# Patient Record
Sex: Male | Born: 1935 | Race: White | Hispanic: No | Marital: Married | Smoking: Former smoker
Health system: Southern US, Community
[De-identification: ages and names within clinical notes are randomized; demographics above are authoritative.]

## PROBLEM LIST (undated history)

## (undated) DIAGNOSIS — G8929 Other chronic pain: Secondary | ICD-10-CM

## (undated) DIAGNOSIS — F329 Major depressive disorder, single episode, unspecified: Secondary | ICD-10-CM

## (undated) DIAGNOSIS — N184 Chronic kidney disease, stage 4 (severe): Secondary | ICD-10-CM

## (undated) DIAGNOSIS — E785 Hyperlipidemia, unspecified: Secondary | ICD-10-CM

## (undated) DIAGNOSIS — N200 Calculus of kidney: Secondary | ICD-10-CM

## (undated) DIAGNOSIS — I10 Essential (primary) hypertension: Secondary | ICD-10-CM

## (undated) DIAGNOSIS — E1143 Type 2 diabetes mellitus with diabetic autonomic (poly)neuropathy: Secondary | ICD-10-CM

## (undated) DIAGNOSIS — Z9289 Personal history of other medical treatment: Secondary | ICD-10-CM

## (undated) DIAGNOSIS — M25579 Pain in unspecified ankle and joints of unspecified foot: Secondary | ICD-10-CM

## (undated) DIAGNOSIS — K3184 Gastroparesis: Secondary | ICD-10-CM

## (undated) DIAGNOSIS — F32A Depression, unspecified: Secondary | ICD-10-CM

## (undated) DIAGNOSIS — E875 Hyperkalemia: Secondary | ICD-10-CM

## (undated) DIAGNOSIS — G709 Myoneural disorder, unspecified: Secondary | ICD-10-CM

## (undated) DIAGNOSIS — J0510 Acute epiglottitis without obstruction: Secondary | ICD-10-CM

## (undated) DIAGNOSIS — K219 Gastro-esophageal reflux disease without esophagitis: Secondary | ICD-10-CM

## (undated) DIAGNOSIS — H905 Unspecified sensorineural hearing loss: Secondary | ICD-10-CM

## (undated) DIAGNOSIS — M1990 Unspecified osteoarthritis, unspecified site: Secondary | ICD-10-CM

## (undated) DIAGNOSIS — E119 Type 2 diabetes mellitus without complications: Secondary | ICD-10-CM

## (undated) DIAGNOSIS — J189 Pneumonia, unspecified organism: Secondary | ICD-10-CM

## (undated) DIAGNOSIS — K922 Gastrointestinal hemorrhage, unspecified: Secondary | ICD-10-CM

## (undated) DIAGNOSIS — R410 Disorientation, unspecified: Secondary | ICD-10-CM

## (undated) DIAGNOSIS — Z8739 Personal history of other diseases of the musculoskeletal system and connective tissue: Secondary | ICD-10-CM

## (undated) DIAGNOSIS — D649 Anemia, unspecified: Secondary | ICD-10-CM

## (undated) DIAGNOSIS — N390 Urinary tract infection, site not specified: Secondary | ICD-10-CM

## (undated) DIAGNOSIS — C61 Malignant neoplasm of prostate: Secondary | ICD-10-CM

## (undated) HISTORY — PX: FRACTURE SURGERY: SHX138

## (undated) HISTORY — PX: APPENDECTOMY: SHX54

## (undated) HISTORY — DX: Calculus of kidney: N20.0

## (undated) HISTORY — DX: Urinary tract infection, site not specified: N39.0

## (undated) HISTORY — DX: Hyperkalemia: E87.5

## (undated) HISTORY — PX: CATARACT EXTRACTION W/ INTRAOCULAR LENS  IMPLANT, BILATERAL: SHX1307

## (undated) HISTORY — DX: Unspecified sensorineural hearing loss: H90.5

## (undated) HISTORY — PX: INGUINAL HERNIA REPAIR: SUR1180

## (undated) HISTORY — DX: Hyperlipidemia, unspecified: E78.5

## (undated) HISTORY — DX: Acute epiglottitis without obstruction: J05.10

---

## 2005-07-06 ENCOUNTER — Ambulatory Visit: Payer: Self-pay | Admitting: Family Medicine

## 2006-06-22 ENCOUNTER — Ambulatory Visit: Payer: Self-pay | Admitting: Cardiology

## 2006-07-20 ENCOUNTER — Encounter (INDEPENDENT_AMBULATORY_CARE_PROVIDER_SITE_OTHER): Payer: Self-pay | Admitting: *Deleted

## 2006-07-21 ENCOUNTER — Inpatient Hospital Stay (HOSPITAL_COMMUNITY): Admission: RE | Admit: 2006-07-21 | Discharge: 2006-07-22 | Payer: Self-pay | Admitting: Urology

## 2007-10-18 ENCOUNTER — Ambulatory Visit (HOSPITAL_COMMUNITY): Admission: RE | Admit: 2007-10-18 | Discharge: 2007-10-18 | Payer: Self-pay | Admitting: Urology

## 2007-11-14 ENCOUNTER — Ambulatory Visit: Admission: RE | Admit: 2007-11-14 | Discharge: 2007-12-27 | Payer: Self-pay | Admitting: Radiation Oncology

## 2007-12-28 ENCOUNTER — Ambulatory Visit: Admission: RE | Admit: 2007-12-28 | Discharge: 2007-12-28 | Payer: Self-pay | Admitting: Radiation Oncology

## 2007-12-29 ENCOUNTER — Encounter: Payer: Self-pay | Admitting: Family Medicine

## 2007-12-29 DIAGNOSIS — C61 Malignant neoplasm of prostate: Secondary | ICD-10-CM

## 2007-12-29 HISTORY — DX: Malignant neoplasm of prostate: C61

## 2008-01-03 ENCOUNTER — Ambulatory Visit: Admission: RE | Admit: 2008-01-03 | Discharge: 2008-03-22 | Payer: Self-pay | Admitting: Radiation Oncology

## 2009-09-24 ENCOUNTER — Encounter (HOSPITAL_COMMUNITY): Admission: RE | Admit: 2009-09-24 | Discharge: 2009-09-24 | Payer: Self-pay | Admitting: Urology

## 2010-12-04 ENCOUNTER — Inpatient Hospital Stay (HOSPITAL_COMMUNITY): Admission: EM | Admit: 2010-12-04 | Discharge: 2010-10-04 | Payer: Self-pay | Admitting: Emergency Medicine

## 2011-01-29 NOTE — Letter (Signed)
Summary: Historic Patient File  Historic Patient File   Imported By: Lind Guest 09/29/2010 10:44:38  _____________________________________________________________________  External Attachment:    Type:   Image     Comment:   External Document

## 2011-03-12 LAB — CLOSTRIDIUM DIFFICILE EIA: C difficile Toxins A+B, EIA: NEGATIVE

## 2011-03-12 LAB — CBC
HCT: 27.2 % — ABNORMAL LOW (ref 39.0–52.0)
HCT: 27.7 % — ABNORMAL LOW (ref 39.0–52.0)
HCT: 29.1 % — ABNORMAL LOW (ref 39.0–52.0)
HCT: 32.5 % — ABNORMAL LOW (ref 39.0–52.0)
Hemoglobin: 10.1 g/dL — ABNORMAL LOW (ref 13.0–17.0)
Hemoglobin: 11.3 g/dL — ABNORMAL LOW (ref 13.0–17.0)
Hemoglobin: 9.8 g/dL — ABNORMAL LOW (ref 13.0–17.0)
Hemoglobin: 9.9 g/dL — ABNORMAL LOW (ref 13.0–17.0)
MCH: 31.6 pg (ref 26.0–34.0)
MCH: 31.8 pg (ref 26.0–34.0)
MCH: 33.3 pg (ref 26.0–34.0)
MCHC: 34.7 g/dL (ref 30.0–36.0)
MCHC: 34.8 g/dL (ref 30.0–36.0)
MCHC: 34.9 g/dL (ref 30.0–36.0)
MCV: 90.6 fL (ref 78.0–100.0)
MCV: 90.9 fL (ref 78.0–100.0)
MCV: 91.4 fL (ref 78.0–100.0)
MCV: 92.2 fL (ref 78.0–100.0)
Platelets: 133 10*3/uL — ABNORMAL LOW (ref 150–400)
Platelets: 142 10*3/uL — ABNORMAL LOW (ref 150–400)
RBC: 2.95 MIL/uL — ABNORMAL LOW (ref 4.22–5.81)
RBC: 3.56 MIL/uL — ABNORMAL LOW (ref 4.22–5.81)
RDW: 12.6 % (ref 11.5–15.5)
RDW: 12.6 % (ref 11.5–15.5)
RDW: 12.8 % (ref 11.5–15.5)
RDW: 12.9 % (ref 11.5–15.5)
WBC: 4.9 10*3/uL (ref 4.0–10.5)
WBC: 5.9 10*3/uL (ref 4.0–10.5)
WBC: 7.8 10*3/uL (ref 4.0–10.5)

## 2011-03-12 LAB — DIFFERENTIAL
Basophils Absolute: 0 10*3/uL (ref 0.0–0.1)
Basophils Absolute: 0 10*3/uL (ref 0.0–0.1)
Basophils Relative: 0 % (ref 0–1)
Basophils Relative: 0 % (ref 0–1)
Eosinophils Absolute: 0.1 10*3/uL (ref 0.0–0.7)
Eosinophils Absolute: 0.1 10*3/uL (ref 0.0–0.7)
Eosinophils Absolute: 0.2 10*3/uL (ref 0.0–0.7)
Eosinophils Absolute: 0.2 10*3/uL (ref 0.0–0.7)
Eosinophils Absolute: 0.2 10*3/uL (ref 0.0–0.7)
Eosinophils Relative: 1 % (ref 0–5)
Eosinophils Relative: 1 % (ref 0–5)
Eosinophils Relative: 3 % (ref 0–5)
Eosinophils Relative: 3 % (ref 0–5)
Lymphocytes Relative: 12 % (ref 12–46)
Lymphs Abs: 1 10*3/uL (ref 0.7–4.0)
Lymphs Abs: 1.2 10*3/uL (ref 0.7–4.0)
Lymphs Abs: 1.3 10*3/uL (ref 0.7–4.0)
Lymphs Abs: 2.1 10*3/uL (ref 0.7–4.0)
Monocytes Absolute: 0.3 10*3/uL (ref 0.1–1.0)
Monocytes Absolute: 0.6 10*3/uL (ref 0.1–1.0)
Monocytes Absolute: 0.9 10*3/uL (ref 0.1–1.0)
Monocytes Relative: 14 % — ABNORMAL HIGH (ref 3–12)
Monocytes Relative: 15 % — ABNORMAL HIGH (ref 3–12)
Monocytes Relative: 8 % (ref 3–12)
Neutro Abs: 6.1 10*3/uL (ref 1.7–7.7)
Neutrophils Relative %: 50 % (ref 43–77)
Neutrophils Relative %: 51 % (ref 43–77)
Neutrophils Relative %: 63 % (ref 43–77)
Neutrophils Relative %: 78 % — ABNORMAL HIGH (ref 43–77)

## 2011-03-12 LAB — BASIC METABOLIC PANEL
BUN: 23 mg/dL (ref 6–23)
BUN: 38 mg/dL — ABNORMAL HIGH (ref 6–23)
CO2: 22 mEq/L (ref 19–32)
CO2: 23 mEq/L (ref 19–32)
Calcium: 9.5 mg/dL (ref 8.4–10.5)
Chloride: 104 mEq/L (ref 96–112)
Chloride: 108 mEq/L (ref 96–112)
Chloride: 110 mEq/L (ref 96–112)
Creatinine, Ser: 1.54 mg/dL — ABNORMAL HIGH (ref 0.4–1.5)
Creatinine, Ser: 1.83 mg/dL — ABNORMAL HIGH (ref 0.4–1.5)
GFR calc Af Amer: 44 mL/min — ABNORMAL LOW (ref >60)
GFR calc Af Amer: 53 mL/min — ABNORMAL LOW (ref >60)
GFR calc non Af Amer: 36 mL/min — ABNORMAL LOW (ref >60)
Glucose, Bld: 269 mg/dL — ABNORMAL HIGH (ref 70–99)
Glucose, Bld: 72 mg/dL (ref 70–99)
Potassium: 4.1 mEq/L (ref 3.5–5.1)
Potassium: 6.5 mEq/L (ref 3.5–5.1)
Sodium: 133 mEq/L — ABNORMAL LOW (ref 135–145)
Sodium: 139 mEq/L (ref 135–145)

## 2011-03-12 LAB — COMPREHENSIVE METABOLIC PANEL
ALT: 19 U/L (ref 0–53)
ALT: 25 U/L (ref 0–53)
ALT: 25 U/L (ref 0–53)
AST: 24 U/L (ref 0–37)
AST: 26 U/L (ref 0–37)
AST: 34 U/L (ref 0–37)
Albumin: 3.4 g/dL — ABNORMAL LOW (ref 3.5–5.2)
Alkaline Phosphatase: 64 U/L (ref 39–117)
Alkaline Phosphatase: 71 U/L (ref 39–117)
BUN: 38 mg/dL — ABNORMAL HIGH (ref 6–23)
CO2: 21 mEq/L (ref 19–32)
CO2: 25 mEq/L (ref 19–32)
Calcium: 8.5 mg/dL (ref 8.4–10.5)
Calcium: 9.2 mg/dL (ref 8.4–10.5)
Chloride: 111 mEq/L (ref 96–112)
Creatinine, Ser: 1.97 mg/dL — ABNORMAL HIGH (ref 0.4–1.5)
GFR calc Af Amer: 40 mL/min — ABNORMAL LOW (ref >60)
GFR calc Af Amer: 56 mL/min — ABNORMAL LOW (ref >60)
GFR calc Af Amer: >60 mL/min (ref >60)
GFR calc non Af Amer: 33 mL/min — ABNORMAL LOW (ref >60)
GFR calc non Af Amer: 51 mL/min — ABNORMAL LOW (ref >60)
Glucose, Bld: 119 mg/dL — ABNORMAL HIGH (ref 70–99)
Glucose, Bld: 126 mg/dL — ABNORMAL HIGH (ref 70–99)
Glucose, Bld: 196 mg/dL — ABNORMAL HIGH (ref 70–99)
Potassium: 3.8 mEq/L (ref 3.5–5.1)
Potassium: 4.6 mEq/L (ref 3.5–5.1)
Sodium: 140 mEq/L (ref 135–145)
Sodium: 140 mEq/L (ref 135–145)
Sodium: 141 mEq/L (ref 135–145)
Total Bilirubin: 0.5 mg/dL (ref 0.3–1.2)
Total Protein: 5.8 g/dL — ABNORMAL LOW (ref 6.0–8.3)
Total Protein: 6 g/dL (ref 6.0–8.3)
Total Protein: 6.9 g/dL (ref 6.0–8.3)

## 2011-03-12 LAB — MAGNESIUM
Magnesium: 1.6 mg/dL (ref 1.5–2.5)
Magnesium: 1.8 mg/dL (ref 1.5–2.5)
Magnesium: 1.8 mg/dL (ref 1.5–2.5)
Magnesium: 1.9 mg/dL (ref 1.5–2.5)

## 2011-03-12 LAB — OSMOLALITY, URINE: Osmolality, Ur: 488 mOsm/kg (ref 390–1090)

## 2011-03-12 LAB — URINALYSIS, ROUTINE W REFLEX MICROSCOPIC
Bilirubin Urine: NEGATIVE
Ketones, ur: NEGATIVE mg/dL
Nitrite: NEGATIVE
Protein, ur: 100 mg/dL — AB
Specific Gravity, Urine: 1.019 (ref 1.005–1.030)
Urobilinogen, UA: 0.2 mg/dL (ref 0.0–1.0)

## 2011-03-12 LAB — GLUCOSE, CAPILLARY
Glucose-Capillary: 117 mg/dL — ABNORMAL HIGH (ref 70–99)
Glucose-Capillary: 122 mg/dL — ABNORMAL HIGH (ref 70–99)
Glucose-Capillary: 143 mg/dL — ABNORMAL HIGH (ref 70–99)
Glucose-Capillary: 149 mg/dL — ABNORMAL HIGH (ref 70–99)
Glucose-Capillary: 166 mg/dL — ABNORMAL HIGH (ref 70–99)
Glucose-Capillary: 182 mg/dL — ABNORMAL HIGH (ref 70–99)
Glucose-Capillary: 192 mg/dL — ABNORMAL HIGH (ref 70–99)
Glucose-Capillary: 265 mg/dL — ABNORMAL HIGH (ref 70–99)
Glucose-Capillary: 92 mg/dL (ref 70–99)
Glucose-Capillary: 93 mg/dL (ref 70–99)
Glucose-Capillary: 98 mg/dL (ref 70–99)

## 2011-03-12 LAB — HEPATIC FUNCTION PANEL
Bilirubin, Direct: 0.1 mg/dL (ref 0.0–0.3)
Indirect Bilirubin: 0.6 mg/dL (ref 0.3–0.9)
Total Protein: 7.3 g/dL (ref 6.0–8.3)

## 2011-03-12 LAB — HEMOGLOBIN A1C
Hgb A1c MFr Bld: 10.1 % — ABNORMAL HIGH (ref <5.7)
Mean Plasma Glucose: 243 mg/dL — ABNORMAL HIGH (ref <117)

## 2011-03-12 LAB — STOOL CULTURE

## 2011-03-12 LAB — SODIUM, URINE, RANDOM: Sodium, Ur: 86 mEq/L

## 2011-03-12 LAB — IRON AND TIBC
Iron: 12 ug/dL — ABNORMAL LOW (ref 42–135)
TIBC: 248 ug/dL (ref 215–435)

## 2011-03-12 LAB — URINE MICROSCOPIC-ADD ON

## 2011-03-12 LAB — OVA AND PARASITE EXAMINATION

## 2011-03-12 LAB — LIPASE, BLOOD: Lipase: 54 U/L (ref 11–59)

## 2011-03-12 LAB — URINE CULTURE: Special Requests: NEGATIVE

## 2011-03-12 LAB — CREATININE, URINE, RANDOM: Creatinine, Urine: 108.3 mg/dL

## 2011-03-12 LAB — CULTURE, BLOOD (ROUTINE X 2)
Culture  Setup Time: 201110041025
Culture: NO GROWTH
Culture: NO GROWTH

## 2011-03-12 LAB — FERRITIN: Ferritin: 542 ng/mL — ABNORMAL HIGH (ref 22–322)

## 2011-03-12 LAB — RETICULOCYTES: Retic Count, Absolute: 46.3 10*3/uL (ref 19.0–186.0)

## 2011-03-12 LAB — VITAMIN B12: Vitamin B-12: 240 pg/mL (ref 211–911)

## 2011-03-12 LAB — TSH: TSH: 2.725 u[IU]/mL (ref 0.350–4.500)

## 2011-05-15 NOTE — Discharge Summary (Signed)
Douglas [name], Douglas [name]              ACCOUNT NO.:  [id number]   MEDICAL RECORD NO.:  [id number]          PATIENT TYPE:  INP   LOCATION:  1412                         FACILITY:  [facility]   PHYSICIAN:  Excell Seltzer. Annabell Howells, M.D.    DATE OF BIRTH:  1931-09-28   DATE OF ADMISSION:  07/20/2006  DATE OF DISCHARGE:  07/22/2006                                 DISCHARGE SUMMARY   BRIEF HISTORY AND PHYSICAL:  The patient is a 77 year old white male with a  history of bladder stone and BPH with bladder outlet obstruction who is to  undergo TURP and cystourethropexy.  For details of the history and physical,  please see the office H and P but preoperative medications included  metformin, Lisinopril, Glyburide for hypertension and diabetes.  He also had  a history of kidney stones and was recently treated with levaquin for a  febrile urinary infection.   PAST SURGICAL HISTORY:  Significant for right inguinal hernia repair,  bladder calculi removal in 1986, ESWL remotely and an appendectomy.   HOSPITAL COURSE:  On 07/20/2006 the patient underwent TURP with removal of  bladder stone.  Postoperatively he did well.  His IV was discontinued along  with his continuous bladder irrigation.  On 07/22/2006 he was felt to be  ready for discharge home.   FINAL DIAGNOSES:  Benign prostatic hypertrophy with bladder outlet  obstruction and bladder stone.   DISCHARGE MEDICATIONS:  Include Cipro.   FOLLOWUP:  He was instructed to follow up with me in two to three weeks.   DISCHARGE INSTRUCTIONS:  His restrictions were explained.   CONDITION ON DISCHARGE:  Improved.   PROGNOSIS:  Good.   FINAL PATHOLOGY:  Revealed benign prostatic tissue with chronic  inflammation.      Excell Seltzer. Annabell Howells, M.D.  Electronically Signed     JJW/MEDQ  D:  08/11/2006  T:  08/11/2006  Job:  [id number]

## 2011-05-15 NOTE — Op Note (Signed)
NAMEESTEPHAN, GALLARDO              ACCOUNT NO.:  [id number]   MEDICAL RECORD NO.:  [id number]          PATIENT TYPE:  AMB   LOCATION:  DAY                          FACILITY:  [facility]   PHYSICIAN:  Excell Seltzer. Annabell Howells, M.D.    DATE OF BIRTH:  May 27, 1931   DATE OF PROCEDURE:  07/20/2006  DATE OF DISCHARGE:                                 OPERATIVE REPORT   PROCEDURE:  Transurethral resection prostate and removal bladder stone.   PREOPERATIVE DIAGNOSIS:  Benign prostatic hypertrophy and bladder  obstruction and bladder stone.   POSTOPERATIVE DIAGNOSIS:  Benign prostatic hypertrophy and bladder  obstruction and bladder stone.   SURGEON:  Bjorn Pippin, M.D.   ANESTHESIA:  General.   SPECIMEN:  Stone, which was given to the patient, and prostate chips, which  were sent to Pathology.   BLOOD LOSS:  500 mL.   COMPLICATIONS:  None.   DRAINS:  A 22-French three-way Foley catheter.   INDICATIONS:  Mr. Cutshaw is a 77 year old white male with a bladder stone  and bladder outlet obstruction.  He has elected TURP and removal of the  stone.   FINDINGS AND PROCEDURE:  The patient is given Cipro and Rocephin  preoperatively and he was taken to the operating room, where a general  anesthetic was induced.  He was placed in the lithotomy position.  His  perineum and genitalia were prepped with Betadine solution.  He was draped  in usual sterile fashion.   Cysto was performed with a 22-French scope and 12-degree lens.  Examination  revealed a normal urethra.  The external sphincter was intact.  The  prostatic urethra was elongated with trilobar hyperplasia with obstruction.  Examination of the bladder revealed moderate to severe trabeculation.  There  was some erythema at the base of the bladder from the stone rubbing against  the mucosa.  I could not see the stone because of the middle lobe.   The urethra was then calibrated to 32-French with Sissy Hoff sounds and a 28-  French continuous-flow  resectoscope sheath with obturator was passed without  difficulty.  This was fitted with an [place] handle, 12-degree lens and 26  loop.   With the resectoscope in place, I still was unable to visualize the stone,  so I initiated resection of the prostate.  The middle lobe was resected down  to the bladder neck fibers.  The stone was then visualized and was removed  via the resectoscope; it measured approximately 1.5 mm in greatest  dimension.  Once the stone was removed, the prostatic resection was  continued.  The floor of the prostate was resected out to alongside the  verumontanum.  The right lobe of the prostate was then resected from bladder  neck to apex out to the capsular fibers.  The left lobe was resected from  bladder neck to apex out to the capsular fibers.  The bladder was evacuated  free of chips, then residual anterior and apical tissue was resected.  Those  chips were removed.  Hemostasis was achieved.  The ureteral orifices were  inspected and were found to  be intact.  No active bleeding or retained chips  or clots were noted.  The bladder was left full.  The scope was removed.  Pressure on the bladder produced an excellent stream.  A 22-French three-way  Foley catheter was then placed with the aid of a catheter guide and the  balloon was filled to 30 mL of sterile fluid.  The catheter was placed on  traction and hand-irrigated until clear; it was then maintained on traction  with continuous normal saline irrigation.  The patient was taken down from  lithotomy position and his anesthetic was reversed.  He was moved to the  recovery room in stable condition.  There were no complications.      Excell Seltzer. Annabell Howells, M.D.  Electronically Signed     JJW/MEDQ  D:  07/20/2006  T:  07/20/2006  Job:  [id number]   cc:   Nena Jordan  [address]  [place]  [place] [address]

## 2011-07-17 ENCOUNTER — Ambulatory Visit (INDEPENDENT_AMBULATORY_CARE_PROVIDER_SITE_OTHER): Payer: No Typology Code available for payment source | Admitting: Urology

## 2011-07-17 DIAGNOSIS — N3020 Other chronic cystitis without hematuria: Secondary | ICD-10-CM

## 2011-07-17 DIAGNOSIS — C61 Malignant neoplasm of prostate: Secondary | ICD-10-CM

## 2011-07-17 DIAGNOSIS — N200 Calculus of kidney: Secondary | ICD-10-CM

## 2011-07-20 ENCOUNTER — Other Ambulatory Visit: Payer: Self-pay | Admitting: Urology

## 2011-07-20 DIAGNOSIS — C61 Malignant neoplasm of prostate: Secondary | ICD-10-CM

## 2011-08-27 ENCOUNTER — Ambulatory Visit (HOSPITAL_COMMUNITY)
Admission: RE | Admit: 2011-08-27 | Discharge: 2011-08-27 | Disposition: A | Discharge status: Home / Self Care | Payer: Medicare Other | Source: Ambulatory Visit | Attending: Urology | Admitting: Urology

## 2011-08-27 ENCOUNTER — Encounter (HOSPITAL_COMMUNITY): Payer: Self-pay

## 2011-08-27 ENCOUNTER — Encounter (HOSPITAL_COMMUNITY)
Admission: RE | Admit: 2011-08-27 | Discharge: 2011-08-27 | Disposition: A | Discharge status: Home / Self Care | Payer: Medicare Other | Source: Ambulatory Visit | Attending: Urology | Admitting: Urology

## 2011-08-27 DIAGNOSIS — C61 Malignant neoplasm of prostate: Secondary | ICD-10-CM

## 2011-08-27 DIAGNOSIS — Q619 Cystic kidney disease, unspecified: Secondary | ICD-10-CM | POA: Insufficient documentation

## 2011-08-27 DIAGNOSIS — N200 Calculus of kidney: Secondary | ICD-10-CM | POA: Insufficient documentation

## 2011-08-27 DIAGNOSIS — E119 Type 2 diabetes mellitus without complications: Secondary | ICD-10-CM | POA: Insufficient documentation

## 2011-08-27 DIAGNOSIS — I10 Essential (primary) hypertension: Secondary | ICD-10-CM | POA: Insufficient documentation

## 2011-08-27 DIAGNOSIS — R599 Enlarged lymph nodes, unspecified: Secondary | ICD-10-CM | POA: Insufficient documentation

## 2011-08-27 DIAGNOSIS — R112 Nausea with vomiting, unspecified: Secondary | ICD-10-CM | POA: Insufficient documentation

## 2011-08-27 DIAGNOSIS — N400 Enlarged prostate without lower urinary tract symptoms: Secondary | ICD-10-CM | POA: Insufficient documentation

## 2011-08-27 DIAGNOSIS — R937 Abnormal findings on diagnostic imaging of other parts of musculoskeletal system: Secondary | ICD-10-CM | POA: Insufficient documentation

## 2011-08-27 HISTORY — DX: Essential (primary) hypertension: I10

## 2011-08-27 LAB — CREATININE, SERUM: GFR calc non Af Amer: 41 mL/min — ABNORMAL LOW (ref >60)

## 2011-08-27 MED ORDER — IOHEXOL 300 MG/ML  SOLN
100.0000 mL | Freq: Once | INTRAMUSCULAR | Status: AC | PRN
  Administered 2011-08-27: 100 mL via INTRAVENOUS

## 2011-08-27 MED ORDER — TECHNETIUM TC 99M MEDRONATE IV KIT
25.0000 | PACK | Freq: Once | INTRAVENOUS | Status: AC | PRN
  Administered 2011-08-27: 26.2 via INTRAVENOUS

## 2011-10-16 ENCOUNTER — Ambulatory Visit (INDEPENDENT_AMBULATORY_CARE_PROVIDER_SITE_OTHER): Payer: Medicare Other | Admitting: Urology

## 2011-10-16 DIAGNOSIS — N3941 Urge incontinence: Secondary | ICD-10-CM

## 2011-10-16 DIAGNOSIS — N200 Calculus of kidney: Secondary | ICD-10-CM

## 2011-10-16 DIAGNOSIS — C61 Malignant neoplasm of prostate: Secondary | ICD-10-CM

## 2011-10-16 DIAGNOSIS — Z87440 Personal history of urinary (tract) infections: Secondary | ICD-10-CM

## 2012-01-08 ENCOUNTER — Ambulatory Visit (INDEPENDENT_AMBULATORY_CARE_PROVIDER_SITE_OTHER): Payer: Medicare Other | Admitting: Urology

## 2012-01-08 DIAGNOSIS — N3941 Urge incontinence: Secondary | ICD-10-CM

## 2012-01-08 DIAGNOSIS — N200 Calculus of kidney: Secondary | ICD-10-CM

## 2012-01-08 DIAGNOSIS — Z87440 Personal history of urinary (tract) infections: Secondary | ICD-10-CM

## 2012-01-08 DIAGNOSIS — C61 Malignant neoplasm of prostate: Secondary | ICD-10-CM

## 2012-01-29 ENCOUNTER — Ambulatory Visit (INDEPENDENT_AMBULATORY_CARE_PROVIDER_SITE_OTHER): Payer: Medicare Other | Admitting: Urology

## 2012-01-29 DIAGNOSIS — N3941 Urge incontinence: Secondary | ICD-10-CM

## 2012-01-29 DIAGNOSIS — R310 Gross hematuria: Secondary | ICD-10-CM

## 2012-01-29 DIAGNOSIS — N3020 Other chronic cystitis without hematuria: Secondary | ICD-10-CM

## 2012-04-08 ENCOUNTER — Ambulatory Visit (INDEPENDENT_AMBULATORY_CARE_PROVIDER_SITE_OTHER): Payer: Medicare Other | Admitting: Urology

## 2012-04-08 DIAGNOSIS — Z87440 Personal history of urinary (tract) infections: Secondary | ICD-10-CM

## 2012-04-08 DIAGNOSIS — N200 Calculus of kidney: Secondary | ICD-10-CM

## 2012-04-08 DIAGNOSIS — C61 Malignant neoplasm of prostate: Secondary | ICD-10-CM

## 2012-04-08 DIAGNOSIS — N3941 Urge incontinence: Secondary | ICD-10-CM

## 2012-04-08 DIAGNOSIS — R310 Gross hematuria: Secondary | ICD-10-CM

## 2012-09-30 ENCOUNTER — Ambulatory Visit (INDEPENDENT_AMBULATORY_CARE_PROVIDER_SITE_OTHER): Payer: Medicare Other | Admitting: Urology

## 2012-09-30 DIAGNOSIS — C61 Malignant neoplasm of prostate: Secondary | ICD-10-CM

## 2012-09-30 DIAGNOSIS — R3129 Other microscopic hematuria: Secondary | ICD-10-CM

## 2012-09-30 DIAGNOSIS — N200 Calculus of kidney: Secondary | ICD-10-CM

## 2012-09-30 DIAGNOSIS — N3941 Urge incontinence: Secondary | ICD-10-CM

## 2013-03-22 ENCOUNTER — Encounter: Payer: Self-pay | Admitting: Cardiology

## 2013-04-27 ENCOUNTER — Ambulatory Visit: Payer: Medicare Other | Admitting: Cardiology

## 2013-05-05 ENCOUNTER — Encounter: Payer: Self-pay | Admitting: *Deleted

## 2013-05-05 ENCOUNTER — Encounter: Payer: Self-pay | Admitting: Internal Medicine

## 2013-05-05 ENCOUNTER — Ambulatory Visit (INDEPENDENT_AMBULATORY_CARE_PROVIDER_SITE_OTHER): Payer: Medicare Other | Admitting: Internal Medicine

## 2013-05-05 VITALS — BP 193/79 | HR 75 | Wt 158.0 lb

## 2013-05-05 DIAGNOSIS — R0602 Shortness of breath: Secondary | ICD-10-CM

## 2013-05-05 DIAGNOSIS — I10 Essential (primary) hypertension: Secondary | ICD-10-CM

## 2013-05-05 NOTE — Progress Notes (Signed)
HPI Patient is a 79 yo who is referred for evaluation of palpitaitons.   The patient was seen by primary MD  In March said he woke up from sleep with his heart racing.  Spell lasted several hours.  No real dizziness  Was SOB  No CP. Had echo done in Dr Sherril Croon' office.  Told it was OK Breathing has been OK Doesn't do much  Neuropathy.   Mows yard with riding more. Slowing down Has been a diabetic for 25 years  Sugars run up and down.  Allergies  Allergen Reactions  . Bactrim (Sulfamethoxazole W-Trimethoprim)     Current Outpatient Prescriptions  Medication Sig Dispense Refill  . carvedilol (COREG) 6.25 MG tablet Take 6.25 mg by mouth 2 (two) times daily with a meal.      . gabapentin (NEURONTIN) 300 MG capsule Take 600 mg by mouth at bedtime.      Marland Kitchen gemfibrozil (LOPID) 600 MG tablet Take 600 mg by mouth 2 (two) times daily before a meal.      . insulin detemir (LEVEMIR) 100 UNIT/ML injection Inject into the skin at bedtime.      . insulin lispro protamine-insulin lispro (HUMALOG 75/25) (75-25) 100 UNIT/ML SUSP Inject 12-16 Units into the skin 3 (three) times daily.      . Insulin Pen Needle (NOVOFINE) 30G X 8 MM MISC Inject 1 packet into the skin as needed.      . Lancets MISC by Does not apply route.       No current facility-administered medications for this visit.    Past Medical History  Diagnosis Date  . Cancer   . Hypertension   . Diabetes mellitus   . Hyperlipidemia   . Cataracts, bilateral   . Hyperkalemia   . UTI (lower urinary tract infection)   . Calculus of kidney   . Sensorineural hearing loss, unspecified   . Epiglottitis     No past surgical history on file.  No family history on file.  History   Social History  . Marital Status: Married    Spouse Name: N/A    Number of Children: N/A  . Years of Education: N/A   Occupational History  . Not on file.   Social History Main Topics  . Smoking status: Never Smoker   . Smokeless tobacco: Not on file  .  Alcohol Use: No  . Drug Use: No  . Sexually Active: Not on file   Other Topics Concern  . Not on file   Social History Narrative  . No narrative on file    Review of Systems:  All systems reviewed.  They are negative to the above problem except as previously stated.  Vital Signs: BP 193/79  Pulse 75  Wt 158 lb (71.668 kg)  Physical Exam Patient is in NAD HEENT:  Normocephalic, atraumatic. EOMI, PERRLA.  Neck: JVP is normal.  No bruits.  Lungs: clear to auscultation. No rales no wheezes.  Heart: Regular rate and rhythm. Normal S1, S2. No S3.   No significant murmurs. PMI not displaced.  Abdomen:  Supple, nontender. Normal bowel sounds. No masses. No hepatomegaly.  Extremities:   Good distal pulses throughout. No lower extremity edema.  Musculoskeletal :moving all extremities.  Neuro:   alert and oriented x3.  CN II-XII grossly intact.  EKG:  SR 75   Assessment and Plan:  1. Palpitations  No recurrence  Would follow  No Rx or tests  2.  SOB  With his long  history of DM with neuropathy I would recomm a lexiscan myoview to r/o inducible ischemia I would with DM and HTN recomm he be on an 81 mg ecASA.    3.  HTN  BP is high today  He used to be on Hyzaar 100/12.5  Good choice with DM   He needs better control of bp to 140s at least.  He says at home and shopp[ing his bp is better  I will not change today but should be followed  Asked him t obring in cuff when he comes   4.  DM  Labile  4.  HL  Will check lipids. I think he should also be on a statin  He was of simvistation 20, fenofibrate 134 nd lopid 600  He stopped all of them when he went to Dr Sherril Croon' office   F/u PRN

## 2013-05-05 NOTE — Patient Instructions (Addendum)
Your physician recommends that you schedule a follow-up appointment in: TO BE DETERMINED POST TEST  Your physician has requested that you have a lexiscan myoview. For further information please visit [email or web address] Please follow instruction sheet, as given.

## 2013-05-11 ENCOUNTER — Telehealth: Payer: Self-pay | Admitting: *Deleted

## 2013-05-11 DIAGNOSIS — E782 Mixed hyperlipidemia: Secondary | ICD-10-CM

## 2013-05-11 NOTE — Telephone Encounter (Signed)
Noted DR Tenny Craw advised for pt to have fasting lipid panel drawn for pt now off meds, per post OV, called pt to advise the lab orders have been faxed to [facility] and he will need to be fasting, per pt stopped simvastin and fenofibrite at [facility], pt understood and the results will be called to the pt

## 2013-05-12 ENCOUNTER — Encounter (HOSPITAL_COMMUNITY): Payer: Self-pay

## 2013-05-12 ENCOUNTER — Ambulatory Visit (HOSPITAL_COMMUNITY)
Admission: RE | Admit: 2013-05-12 | Discharge: 2013-05-12 | Disposition: A | Discharge status: Home / Self Care | Payer: Medicare Other | Source: Ambulatory Visit | Attending: Internal Medicine | Admitting: Internal Medicine

## 2013-05-12 ENCOUNTER — Encounter (HOSPITAL_COMMUNITY)
Admission: RE | Admit: 2013-05-12 | Discharge: 2013-05-12 | Disposition: A | Discharge status: Home / Self Care | Payer: Medicare Other | Source: Ambulatory Visit | Attending: Internal Medicine | Admitting: Internal Medicine

## 2013-05-12 DIAGNOSIS — I10 Essential (primary) hypertension: Secondary | ICD-10-CM | POA: Insufficient documentation

## 2013-05-12 DIAGNOSIS — R0602 Shortness of breath: Secondary | ICD-10-CM

## 2013-05-12 DIAGNOSIS — R0609 Other forms of dyspnea: Secondary | ICD-10-CM | POA: Insufficient documentation

## 2013-05-12 DIAGNOSIS — R0989 Other specified symptoms and signs involving the circulatory and respiratory systems: Secondary | ICD-10-CM | POA: Insufficient documentation

## 2013-05-12 MED ORDER — TECHNETIUM TC 99M SESTAMIBI - CARDIOLITE
10.0000 | Freq: Once | INTRAVENOUS | Status: AC | PRN
  Administered 2013-05-12: 10:00:00 10 via INTRAVENOUS

## 2013-05-12 MED ORDER — REGADENOSON 0.4 MG/5ML IV SOLN
INTRAVENOUS | Status: AC
  Administered 2013-05-12: 0.4 mg via INTRAVENOUS
  Filled 2013-05-12: qty 5

## 2013-05-12 MED ORDER — SODIUM CHLORIDE 0.9 % IJ SOLN
INTRAMUSCULAR | Status: AC
  Administered 2013-05-12: 10 mL via INTRAVENOUS
  Filled 2013-05-12: qty 10

## 2013-05-12 MED ORDER — TECHNETIUM TC 99M SESTAMIBI - CARDIOLITE
30.0000 | Freq: Once | INTRAVENOUS | Status: AC | PRN
  Administered 2013-05-12: 12:00:00 30 via INTRAVENOUS

## 2013-05-12 NOTE — Progress Notes (Signed)
Stress Lab Nurses Notes - Douglas [name] 05/12/2013 Reason for doing test: Dyspnea Type of test: [name] Nurse performing test: Parke Poisson, RN Nuclear Medicine Tech: Lou Cal Echo Tech: Not Applicable MD performing test: R. Rothbart & Joni Reining NP Family MD: Sherril Croon Test explained and consent signed: yes IV started: 22g jelco, Saline lock flushed, No redness or edema and Saline lock started in radiology Symptoms: dizziness Treatment/Intervention: None Reason test stopped: protocol completed After recovery IV was: Discontinued via X-ray tech and No redness or edema Patient to return to Nuc. Med at : 12:30 Patient discharged: Home Patient's Condition upon discharge was: stable Comments: During test BP 197/88 & HR 80.   Recovery BP 201/84 & HR 75.  Symptom resolved in recovery. [name]

## 2013-05-16 ENCOUNTER — Encounter: Payer: Self-pay | Admitting: Internal Medicine

## 2013-05-17 LAB — LIPID PANEL
HDL: 24 mg/dL — ABNORMAL LOW (ref >39)
Triglycerides: 1832 mg/dL — ABNORMAL HIGH (ref <150)

## 2013-06-01 ENCOUNTER — Other Ambulatory Visit: Payer: Self-pay | Admitting: *Deleted

## 2013-06-01 MED ORDER — OMEGA-3-ACID ETHYL ESTERS 1 G PO CAPS: 2.0000 g | ORAL_CAPSULE | Freq: Two times a day (BID) | ORAL | Status: DC

## 2013-06-01 MED ORDER — ATORVASTATIN CALCIUM 10 MG PO TABS: 10.0000 mg | ORAL_TABLET | Freq: Every day | ORAL | Status: DC

## 2013-08-02 ENCOUNTER — Encounter (INDEPENDENT_AMBULATORY_CARE_PROVIDER_SITE_OTHER): Payer: Medicare Other | Admitting: Ophthalmology

## 2013-08-02 DIAGNOSIS — E1139 Type 2 diabetes mellitus with other diabetic ophthalmic complication: Secondary | ICD-10-CM

## 2013-08-02 DIAGNOSIS — H43819 Vitreous degeneration, unspecified eye: Secondary | ICD-10-CM

## 2013-08-02 DIAGNOSIS — H3581 Retinal edema: Secondary | ICD-10-CM

## 2013-08-02 DIAGNOSIS — I10 Essential (primary) hypertension: Secondary | ICD-10-CM

## 2013-08-02 DIAGNOSIS — E11319 Type 2 diabetes mellitus with unspecified diabetic retinopathy without macular edema: Secondary | ICD-10-CM

## 2013-08-02 DIAGNOSIS — H35039 Hypertensive retinopathy, unspecified eye: Secondary | ICD-10-CM

## 2013-08-02 DIAGNOSIS — H2510 Age-related nuclear cataract, unspecified eye: Secondary | ICD-10-CM

## 2013-08-24 ENCOUNTER — Ambulatory Visit: Payer: Self-pay | Admitting: Internal Medicine

## 2013-09-15 ENCOUNTER — Ambulatory Visit (INDEPENDENT_AMBULATORY_CARE_PROVIDER_SITE_OTHER): Payer: Medicare Other | Admitting: Urology

## 2013-09-15 ENCOUNTER — Other Ambulatory Visit: Payer: Self-pay | Admitting: Urology

## 2013-09-15 DIAGNOSIS — M545 Low back pain, unspecified: Secondary | ICD-10-CM

## 2013-09-15 DIAGNOSIS — C61 Malignant neoplasm of prostate: Secondary | ICD-10-CM

## 2013-09-15 DIAGNOSIS — N200 Calculus of kidney: Secondary | ICD-10-CM

## 2013-09-18 ENCOUNTER — Other Ambulatory Visit: Payer: Self-pay | Admitting: Urology

## 2013-09-18 ENCOUNTER — Ambulatory Visit (HOSPITAL_COMMUNITY)
Admission: RE | Admit: 2013-09-18 | Discharge: 2013-09-18 | Disposition: A | Discharge status: Home / Self Care | Payer: Medicare Other | Source: Ambulatory Visit | Attending: Urology | Admitting: Urology

## 2013-09-18 DIAGNOSIS — R599 Enlarged lymph nodes, unspecified: Secondary | ICD-10-CM | POA: Insufficient documentation

## 2013-09-18 DIAGNOSIS — C61 Malignant neoplasm of prostate: Secondary | ICD-10-CM

## 2013-09-18 DIAGNOSIS — N200 Calculus of kidney: Secondary | ICD-10-CM | POA: Insufficient documentation

## 2013-09-18 MED ORDER — IOHEXOL 300 MG/ML  SOLN: 100.0000 mL | Freq: Once | INTRAMUSCULAR | Status: AC | PRN

## 2013-09-20 ENCOUNTER — Encounter (HOSPITAL_COMMUNITY)
Admission: RE | Admit: 2013-09-20 | Discharge: 2013-09-20 | Disposition: A | Discharge status: Home / Self Care | Payer: Medicare Other | Source: Ambulatory Visit | Attending: Urology | Admitting: Urology

## 2013-09-20 ENCOUNTER — Encounter (HOSPITAL_COMMUNITY): Payer: Self-pay

## 2013-09-20 DIAGNOSIS — C61 Malignant neoplasm of prostate: Secondary | ICD-10-CM

## 2013-09-20 MED ORDER — TECHNETIUM TC 99M MEDRONATE IV KIT
25.0000 | PACK | Freq: Once | INTRAVENOUS | Status: AC | PRN
  Administered 2013-09-20: 24 via INTRAVENOUS

## 2013-10-20 ENCOUNTER — Other Ambulatory Visit: Payer: Self-pay | Admitting: Urology

## 2013-10-20 ENCOUNTER — Encounter (INDEPENDENT_AMBULATORY_CARE_PROVIDER_SITE_OTHER): Payer: Self-pay

## 2013-10-20 ENCOUNTER — Ambulatory Visit (INDEPENDENT_AMBULATORY_CARE_PROVIDER_SITE_OTHER): Payer: Medicare Other | Admitting: Urology

## 2013-10-20 DIAGNOSIS — R3129 Other microscopic hematuria: Secondary | ICD-10-CM

## 2013-10-20 DIAGNOSIS — C61 Malignant neoplasm of prostate: Secondary | ICD-10-CM

## 2013-10-20 DIAGNOSIS — C969 Malignant neoplasm of lymphoid, hematopoietic and related tissue, unspecified: Secondary | ICD-10-CM

## 2013-10-20 DIAGNOSIS — M545 Low back pain: Secondary | ICD-10-CM

## 2013-10-27 ENCOUNTER — Other Ambulatory Visit: Payer: Self-pay | Admitting: Urology

## 2013-10-27 DIAGNOSIS — E559 Vitamin D deficiency, unspecified: Secondary | ICD-10-CM

## 2013-11-17 ENCOUNTER — Ambulatory Visit (INDEPENDENT_AMBULATORY_CARE_PROVIDER_SITE_OTHER): Payer: Medicare Other | Admitting: Urology

## 2013-11-17 ENCOUNTER — Encounter (INDEPENDENT_AMBULATORY_CARE_PROVIDER_SITE_OTHER): Payer: Self-pay

## 2013-11-17 DIAGNOSIS — C61 Malignant neoplasm of prostate: Secondary | ICD-10-CM

## 2013-11-17 DIAGNOSIS — N393 Stress incontinence (female) (male): Secondary | ICD-10-CM

## 2013-12-04 ENCOUNTER — Ambulatory Visit (INDEPENDENT_AMBULATORY_CARE_PROVIDER_SITE_OTHER): Payer: Medicare Other | Admitting: Ophthalmology

## 2013-12-04 DIAGNOSIS — H43819 Vitreous degeneration, unspecified eye: Secondary | ICD-10-CM

## 2013-12-04 DIAGNOSIS — H35039 Hypertensive retinopathy, unspecified eye: Secondary | ICD-10-CM

## 2013-12-04 DIAGNOSIS — E11319 Type 2 diabetes mellitus with unspecified diabetic retinopathy without macular edema: Secondary | ICD-10-CM

## 2013-12-04 DIAGNOSIS — I10 Essential (primary) hypertension: Secondary | ICD-10-CM

## 2013-12-04 DIAGNOSIS — E1139 Type 2 diabetes mellitus with other diabetic ophthalmic complication: Secondary | ICD-10-CM

## 2013-12-04 DIAGNOSIS — H2510 Age-related nuclear cataract, unspecified eye: Secondary | ICD-10-CM

## 2013-12-29 ENCOUNTER — Encounter (INDEPENDENT_AMBULATORY_CARE_PROVIDER_SITE_OTHER): Payer: Self-pay

## 2013-12-29 ENCOUNTER — Ambulatory Visit (INDEPENDENT_AMBULATORY_CARE_PROVIDER_SITE_OTHER): Payer: Medicare Other | Admitting: Urology

## 2013-12-29 DIAGNOSIS — C969 Malignant neoplasm of lymphoid, hematopoietic and related tissue, unspecified: Secondary | ICD-10-CM

## 2013-12-29 DIAGNOSIS — N393 Stress incontinence (female) (male): Secondary | ICD-10-CM

## 2013-12-29 DIAGNOSIS — C61 Malignant neoplasm of prostate: Secondary | ICD-10-CM

## 2014-01-30 ENCOUNTER — Ambulatory Visit (INDEPENDENT_AMBULATORY_CARE_PROVIDER_SITE_OTHER): Payer: Medicare Other | Admitting: Urology

## 2014-01-30 DIAGNOSIS — C969 Malignant neoplasm of lymphoid, hematopoietic and related tissue, unspecified: Secondary | ICD-10-CM

## 2014-01-30 DIAGNOSIS — C61 Malignant neoplasm of prostate: Secondary | ICD-10-CM

## 2014-02-27 ENCOUNTER — Ambulatory Visit: Payer: Medicare Other | Admitting: Urology

## 2014-04-06 ENCOUNTER — Ambulatory Visit (INDEPENDENT_AMBULATORY_CARE_PROVIDER_SITE_OTHER): Payer: Medicare Other | Admitting: Urology

## 2014-04-06 DIAGNOSIS — C61 Malignant neoplasm of prostate: Secondary | ICD-10-CM

## 2014-04-06 DIAGNOSIS — N393 Stress incontinence (female) (male): Secondary | ICD-10-CM

## 2014-04-19 ENCOUNTER — Encounter (HOSPITAL_COMMUNITY): Payer: Self-pay | Admitting: Emergency Medicine

## 2014-04-19 ENCOUNTER — Inpatient Hospital Stay (HOSPITAL_COMMUNITY)
Admission: EM | Admit: 2014-04-19 | Discharge: 2014-04-23 | DRG: 683 | Disposition: A | Discharge status: Home Health | Payer: Medicare Other | Attending: Internal Medicine | Admitting: Internal Medicine

## 2014-04-19 DIAGNOSIS — E860 Dehydration: Secondary | ICD-10-CM | POA: Diagnosis present

## 2014-04-19 DIAGNOSIS — I129 Hypertensive chronic kidney disease with stage 1 through stage 4 chronic kidney disease, or unspecified chronic kidney disease: Secondary | ICD-10-CM | POA: Diagnosis present

## 2014-04-19 DIAGNOSIS — E875 Hyperkalemia: Secondary | ICD-10-CM | POA: Diagnosis present

## 2014-04-19 DIAGNOSIS — B370 Candidal stomatitis: Secondary | ICD-10-CM | POA: Diagnosis present

## 2014-04-19 DIAGNOSIS — J029 Acute pharyngitis, unspecified: Secondary | ICD-10-CM

## 2014-04-19 DIAGNOSIS — Z87442 Personal history of urinary calculi: Secondary | ICD-10-CM

## 2014-04-19 DIAGNOSIS — I10 Essential (primary) hypertension: Secondary | ICD-10-CM | POA: Diagnosis present

## 2014-04-19 DIAGNOSIS — B9789 Other viral agents as the cause of diseases classified elsewhere: Secondary | ICD-10-CM | POA: Diagnosis present

## 2014-04-19 DIAGNOSIS — N183 Chronic kidney disease, stage 3 unspecified: Secondary | ICD-10-CM | POA: Diagnosis present

## 2014-04-19 DIAGNOSIS — C61 Malignant neoplasm of prostate: Secondary | ICD-10-CM | POA: Diagnosis present

## 2014-04-19 DIAGNOSIS — E1149 Type 2 diabetes mellitus with other diabetic neurological complication: Secondary | ICD-10-CM | POA: Diagnosis present

## 2014-04-19 DIAGNOSIS — D696 Thrombocytopenia, unspecified: Secondary | ICD-10-CM

## 2014-04-19 DIAGNOSIS — J040 Acute laryngitis: Secondary | ICD-10-CM | POA: Diagnosis present

## 2014-04-19 DIAGNOSIS — N289 Disorder of kidney and ureter, unspecified: Secondary | ICD-10-CM | POA: Diagnosis present

## 2014-04-19 DIAGNOSIS — E785 Hyperlipidemia, unspecified: Secondary | ICD-10-CM | POA: Diagnosis present

## 2014-04-19 DIAGNOSIS — E872 Acidosis, unspecified: Secondary | ICD-10-CM | POA: Diagnosis present

## 2014-04-19 DIAGNOSIS — N179 Acute kidney failure, unspecified: Principal | ICD-10-CM | POA: Diagnosis present

## 2014-04-19 DIAGNOSIS — E119 Type 2 diabetes mellitus without complications: Secondary | ICD-10-CM | POA: Diagnosis present

## 2014-04-19 DIAGNOSIS — H905 Unspecified sensorineural hearing loss: Secondary | ICD-10-CM | POA: Diagnosis present

## 2014-04-19 DIAGNOSIS — J060 Acute laryngopharyngitis: Secondary | ICD-10-CM

## 2014-04-19 DIAGNOSIS — E1142 Type 2 diabetes mellitus with diabetic polyneuropathy: Secondary | ICD-10-CM | POA: Diagnosis present

## 2014-04-19 DIAGNOSIS — J069 Acute upper respiratory infection, unspecified: Secondary | ICD-10-CM | POA: Diagnosis present

## 2014-04-19 DIAGNOSIS — D649 Anemia, unspecified: Secondary | ICD-10-CM | POA: Diagnosis present

## 2014-04-19 DIAGNOSIS — R000 Tachycardia, unspecified: Secondary | ICD-10-CM | POA: Diagnosis present

## 2014-04-19 DIAGNOSIS — J0510 Acute epiglottitis without obstruction: Secondary | ICD-10-CM

## 2014-04-19 LAB — BASIC METABOLIC PANEL
BUN: 45 mg/dL — ABNORMAL HIGH (ref 6–23)
BUN: 50 mg/dL — ABNORMAL HIGH (ref 6–23)
CHLORIDE: 104 meq/L (ref 96–112)
CO2: 20 meq/L (ref 19–32)
CO2: 18 mEq/L — ABNORMAL LOW (ref 19–32)
CREATININE: 2.81 mg/dL — AB (ref 0.50–1.35)
Calcium: 9.8 mg/dL (ref 8.4–10.5)
Calcium: 8.4 mg/dL (ref 8.4–10.5)
Chloride: 102 mEq/L (ref 96–112)
Creatinine, Ser: 2.7 mg/dL — ABNORMAL HIGH (ref 0.50–1.35)
GFR calc Af Amer: 24 mL/min — ABNORMAL LOW (ref >90)
GFR calc Af Amer: 23 mL/min — ABNORMAL LOW (ref >90)
GFR calc non Af Amer: 21 mL/min — ABNORMAL LOW (ref >90)
GFR calc non Af Amer: 20 mL/min — ABNORMAL LOW (ref >90)
Glucose, Bld: 158 mg/dL — ABNORMAL HIGH (ref 70–99)
Glucose, Bld: 149 mg/dL — ABNORMAL HIGH (ref 70–99)
POTASSIUM: 5.7 meq/L — AB (ref 3.7–5.3)
Potassium: 6.3 mEq/L — ABNORMAL HIGH (ref 3.7–5.3)
SODIUM: 138 meq/L (ref 137–147)
SODIUM: 136 meq/L — AB (ref 137–147)

## 2014-04-19 LAB — CBC WITH DIFFERENTIAL/PLATELET
Basophils Absolute: 0.1 10*3/uL (ref 0.0–0.1)
Basophils Relative: 1 % (ref 0–1)
EOS PCT: 2 % (ref 0–5)
Eosinophils Absolute: 0.2 10*3/uL (ref 0.0–0.7)
HEMATOCRIT: 37.8 % — AB (ref 39.0–52.0)
HEMOGLOBIN: 13 g/dL (ref 13.0–17.0)
LYMPHS ABS: 1.8 10*3/uL (ref 0.7–4.0)
LYMPHS PCT: 22 % (ref 12–46)
MCH: 31 pg (ref 26.0–34.0)
MCHC: 34.4 g/dL (ref 30.0–36.0)
MCV: 90 fL (ref 78.0–100.0)
Monocytes Absolute: 1 10*3/uL (ref 0.1–1.0)
Monocytes Relative: 12 % (ref 3–12)
Neutro Abs: 5.2 10*3/uL (ref 1.7–7.7)
Neutrophils Relative %: 64 % (ref 43–77)
Platelets: 127 10*3/uL — ABNORMAL LOW (ref 150–400)
RBC: 4.2 MIL/uL — AB (ref 4.22–5.81)
RDW: 13.2 % (ref 11.5–15.5)
WBC: 8.2 10*3/uL (ref 4.0–10.5)

## 2014-04-19 LAB — URINALYSIS, ROUTINE W REFLEX MICROSCOPIC
BILIRUBIN URINE: NEGATIVE
Glucose, UA: NEGATIVE mg/dL
Ketones, ur: NEGATIVE mg/dL
Leukocytes, UA: NEGATIVE
NITRITE: NEGATIVE
PH: 5.5 (ref 5.0–8.0)
Protein, ur: >300 mg/dL — AB
Urobilinogen, UA: 0.2 mg/dL (ref 0.0–1.0)

## 2014-04-19 LAB — URINE MICROSCOPIC-ADD ON

## 2014-04-19 LAB — CBG MONITORING, ED: GLUCOSE-CAPILLARY: 97 mg/dL (ref 70–99)

## 2014-04-19 LAB — GLUCOSE, CAPILLARY: GLUCOSE-CAPILLARY: 133 mg/dL — AB (ref 70–99)

## 2014-04-19 LAB — MONONUCLEOSIS SCREEN: Mono Screen: NEGATIVE

## 2014-04-19 LAB — RAPID STREP SCREEN (MED CTR MEBANE ONLY): STREPTOCOCCUS, GROUP A SCREEN (DIRECT): NEGATIVE

## 2014-04-19 MED ORDER — KETOROLAC TROMETHAMINE 30 MG/ML IJ SOLN
30.0000 mg | Freq: Once | INTRAMUSCULAR | Status: AC
  Administered 2014-04-19: 30 mg via INTRAVENOUS
  Filled 2014-04-19: qty 1

## 2014-04-19 MED ORDER — ONDANSETRON HCL 4 MG/2ML IJ SOLN
4.0000 mg | Freq: Four times a day (QID) | INTRAMUSCULAR | Status: DC | PRN
  Administered 2014-04-20: 4 mg via INTRAVENOUS
  Filled 2014-04-19: qty 2

## 2014-04-19 MED ORDER — SODIUM CHLORIDE 0.9 % IV BOLUS (SEPSIS)
1000.0000 mL | Freq: Once | INTRAVENOUS | Status: AC
  Administered 2014-04-19: 1000 mL via INTRAVENOUS

## 2014-04-19 MED ORDER — INSULIN ASPART 100 UNIT/ML ~~LOC~~ SOLN
0.0000 [IU] | Freq: Three times a day (TID) | SUBCUTANEOUS | Status: DC
  Administered 2014-04-20 – 2014-04-21 (×4): 2 [IU] via SUBCUTANEOUS
  Administered 2014-04-21 – 2014-04-22 (×2): 1 [IU] via SUBCUTANEOUS
  Administered 2014-04-22: 2 [IU] via SUBCUTANEOUS
  Administered 2014-04-23: 1 [IU] via SUBCUTANEOUS
  Administered 2014-04-23: 2 [IU] via SUBCUTANEOUS

## 2014-04-19 MED ORDER — HYDROCODONE-ACETAMINOPHEN 5-325 MG PO TABS
1.0000 | ORAL_TABLET | ORAL | Status: DC | PRN
  Administered 2014-04-19 – 2014-04-20 (×2): 1 via ORAL
  Administered 2014-04-20: 2 via ORAL
  Filled 2014-04-19 (×2): qty 1
  Filled 2014-04-19: qty 2

## 2014-04-19 MED ORDER — SODIUM CHLORIDE 0.9 % IV SOLN
INTRAVENOUS | Status: DC
  Administered 2014-04-19 (×2): via INTRAVENOUS

## 2014-04-19 MED ORDER — ACETAMINOPHEN 650 MG RE SUPP: 650.0000 mg | Freq: Four times a day (QID) | RECTAL | Status: DC | PRN

## 2014-04-19 MED ORDER — ENOXAPARIN SODIUM 30 MG/0.3ML ~~LOC~~ SOLN
30.0000 mg | SUBCUTANEOUS | Status: DC
  Administered 2014-04-20 – 2014-04-22 (×3): 30 mg via SUBCUTANEOUS
  Filled 2014-04-19 (×3): qty 0.3

## 2014-04-19 MED ORDER — INSULIN DETEMIR 100 UNIT/ML ~~LOC~~ SOLN
24.0000 [IU] | Freq: Every day | SUBCUTANEOUS | Status: DC
  Administered 2014-04-19 – 2014-04-22 (×4): 24 [IU] via SUBCUTANEOUS
  Filled 2014-04-19 (×7): qty 0.24

## 2014-04-19 MED ORDER — INSULIN ASPART 100 UNIT/ML ~~LOC~~ SOLN: 0.0000 [IU] | Freq: Every day | SUBCUTANEOUS | Status: DC

## 2014-04-19 MED ORDER — MORPHINE SULFATE 2 MG/ML IJ SOLN
2.0000 mg | Freq: Once | INTRAMUSCULAR | Status: AC
  Administered 2014-04-19: 2 mg via INTRAVENOUS
  Filled 2014-04-19: qty 1

## 2014-04-19 MED ORDER — CARVEDILOL 12.5 MG PO TABS
6.2500 mg | ORAL_TABLET | Freq: Two times a day (BID) | ORAL | Status: DC
  Administered 2014-04-20 – 2014-04-22 (×5): 6.25 mg via ORAL
  Filled 2014-04-19 (×5): qty 1

## 2014-04-19 MED ORDER — ENOXAPARIN SODIUM 40 MG/0.4ML ~~LOC~~ SOLN: 40.0000 mg | SUBCUTANEOUS | Status: DC

## 2014-04-19 MED ORDER — MENTHOL 3 MG MT LOZG
1.0000 | LOZENGE | OROMUCOSAL | Status: DC | PRN
  Administered 2014-04-19: 3 mg via ORAL
  Filled 2014-04-19: qty 9

## 2014-04-19 MED ORDER — ONDANSETRON HCL 4 MG PO TABS: 4.0000 mg | ORAL_TABLET | Freq: Four times a day (QID) | ORAL | Status: DC | PRN

## 2014-04-19 MED ORDER — GEMFIBROZIL 600 MG PO TABS
600.0000 mg | ORAL_TABLET | Freq: Two times a day (BID) | ORAL | Status: DC
  Administered 2014-04-19 – 2014-04-23 (×8): 600 mg via ORAL
  Filled 2014-04-19 (×8): qty 1

## 2014-04-19 MED ORDER — ONDANSETRON HCL 4 MG/2ML IJ SOLN
4.0000 mg | Freq: Once | INTRAMUSCULAR | Status: AC
  Administered 2014-04-19: 4 mg via INTRAVENOUS
  Filled 2014-04-19: qty 2

## 2014-04-19 MED ORDER — ACETAMINOPHEN 325 MG PO TABS: 650.0000 mg | ORAL_TABLET | Freq: Four times a day (QID) | ORAL | Status: DC | PRN

## 2014-04-19 MED ORDER — GABAPENTIN 300 MG PO CAPS
600.0000 mg | ORAL_CAPSULE | Freq: Every day | ORAL | Status: DC
  Administered 2014-04-19 – 2014-04-22 (×4): 600 mg via ORAL
  Filled 2014-04-19 (×4): qty 2

## 2014-04-19 NOTE — ED Notes (Signed)
RM 339 assigned

## 2014-04-19 NOTE — ED Notes (Signed)
sorethroat since Monday.  Seen family doctor Tuesday and put on amoxicillin.  Throat is getting worse.  Unable to swallow secretions.

## 2014-04-19 NOTE — H&P (Signed)
[name] History and Physical  Douglas [name]:[id number] DOB: [birth year] DOA: 04/19/2014  Referring physician: cook PCP: [name]., MD   Chief Complaint: sore throat  HPI: Douglas [name] is a 80 y.o. male with past medical history that includes prostate cancer, hypertension, diabetes, presents to the emergency department with chief complaint of sore throat and generalized weakness. Reports severe ear ache started 4 days ago. He saw his family doctor and was prescribed amoxicillin. Reports ear pain improved and he developed a sore throat during the last 4 days. This morning pain and worsened to the point he was unable to swallow and he is losing his voice. Associated symptoms include nausea with one episode of emesis last night. Denies coffee ground emesis. In addition he experienced weakness, chills with subjective fever, decreased by mouth intake and moist productive cough. Denies chest pain palpitations shortness of breath dizziness syncope or near-syncope. Denies abdominal pain dysuria hematuria frequency or urgency Initial workup in the emergency room reveals acute renal failure, mild hyperkalemia likely related to dehydration. Urinalysis with few squama cell, many bacteria 3-6 WBCs. Rapid strep and mono test negative. He is hemodynamically stable and not hypoxic and afebrile. We are asked to admit    Review of Systems:  10 point review of systems complete and all systems are negative except as indicated in history of present illness   Past Medical History  Diagnosis Date  . Cancer   . Hypertension   . Diabetes mellitus   . Hyperlipidemia   . Cataracts, bilateral   . Hyperkalemia   . UTI (lower urinary tract infection)   . Calculus of kidney   . Sensorineural hearing loss, unspecified   . Epiglottitis    History reviewed. No pertinent past surgical history. Social History:  reports that he has never smoked. He does not have any smokeless tobacco history on  file. He reports that he does not drink alcohol or use illicit drugs. He lives with his wife he is independent with ADLs No Known Allergies  History reviewed. No pertinent family history. family medical history reviewed and noncontributory to the admission of this elderly gentleman  Prior to Admission medications   Medication Sig Start Date End Date Taking? Authorizing Provider  amoxicillin (AMOXIL) 500 MG capsule Take 500 mg by mouth 3 (three) times daily.   Yes Historical Provider, MD  carvedilol (COREG) 6.25 MG tablet Take 6.25 mg by mouth 2 (two) times daily with a meal.   Yes Historical Provider, MD  gabapentin (NEURONTIN) 300 MG capsule Take 600 mg by mouth at bedtime.   Yes Historical Provider, MD  gemfibrozil (LOPID) 600 MG tablet Take 600 mg by mouth 2 (two) times daily before a meal.   Yes Historical Provider, MD  insulin detemir (LEVEMIR) 100 UNIT/ML injection Inject 34 Units into the skin at bedtime.    Yes Historical Provider, MD  insulin lispro protamine-insulin lispro (HUMALOG 75/25) (75-25) 100 UNIT/ML SUSP Inject 12-16 Units into the skin 3 (three) times daily.   Yes Historical Provider, MD   Physical Exam: Filed Vitals:   04/19/14 1026  BP: 136/71  Pulse: 104  Temp: 97.7 F (36.5 C)  Resp: 18    BP 136/71  Pulse 104  Temp(Src) 97.7 F (36.5 C) (Oral)  Resp 18  Ht 5\' 5"  (1.651 m)  Wt 65.772 kg (145 lb)  BMI 24.13 kg/m2  SpO2 96%  General:  Appears calm and comfortable Eyes: PERRL, normal lids, irises & conjunctiva ENT: Ears are  clear nose without drainage oropharynx with mild erythema no edema or exudate. Because membranes of his mouth pink but dry Neck: no, masses or thyromegaly Cardiovascular: RRR, no m/r/g. No LE edema. Respiratory: CTA bilaterally, no w/r/r. Normal respiratory effort. Abdomen: soft, ntnd positive bowel sounds throughout Skin: no rash or induration seen on limited exam Musculoskeletal: grossly normal tone BUE/BLE Psychiatric: grossly  normal mood and affect, speech fluent and appropriate Neurologic: grossly non-focal. Speech clear facial symmetry           Labs on Admission:  Basic Metabolic Panel:  Recent Labs Lab 04/19/14 1139  NA 138  K 5.7*  CL 102  CO2 20  GLUCOSE 158*  BUN 45*  CREATININE 2.70*  CALCIUM 9.8   Liver Function Tests: No results found for this basename: AST, ALT, ALKPHOS, BILITOT, PROT, ALBUMIN,  in the last 168 hours No results found for this basename: LIPASE, AMYLASE,  in the last 168 hours No results found for this basename: AMMONIA,  in the last 168 hours CBC:  Recent Labs Lab 04/19/14 1139  WBC 8.2  NEUTROABS 5.2  HGB 13.0  HCT 37.8*  MCV 90.0  PLT 127*   Cardiac Enzymes: No results found for this basename: CKTOTAL, CKMB, CKMBINDEX, TROPONINI,  in the last 168 hours  BNP (last 3 results) No results found for this basename: PROBNP,  in the last 8760 hours CBG: No results found for this basename: GLUCAP,  in the last 168 hours  Radiological Exams on Admission: No results found.  EKG: I  Assessment/Plan Principal Problem:   Acute renal failure; likely related to dehydration secondary to acute illness. Will admit to medical floor. Will provide vigorous IV fluids. Will hold any nephrotoxins. Will monitor his urine output. Will recheck in the a.m. Active Problems:   Hyperkalemia: Mild. Related to #1. Will provide vigorous IV fluids and recheck bemet this evening. If no improvement consider one dose of Kayexalate   Dehydration: Related to decreased by mouth intake due to sore throat. Will support with IV fluids. Will provide soft diet as patient is requesting food at the time of my exam.  Diabetes: Patient on 34 units of Levemir at home. Will provide this at a slightly lower dose. Will use sliding scale insulin for optimal glycemic control. Will obtain an A1c. Modified    Hypertension: Controlled. Patient is on Coreg at home we will continue this.    Hyperlipidemia:  Reports being on a statin in the past but has not taken it recently. Unclear as to why. Will obtain a lipid panel       Sore throat and laryngitis: Likely related to viral illness. Will obtain respiratory viral panel. Cepacol lozenges. Soft diet    Thrombocytopenia: No history of same. Likely related to acute illness. No signs symptoms of bleeding. Will monitor    Code Status: full Family Communication: wife at bedside Disposition Plan: home 24-36 hours  Time spent: 6 minutes  [facility]  Patient seen and examined, database reviewed. Agree with above note by Dyanne Carrel, NP. Patient with a 4 day history of what appears to be a viral URI with pharyngitis. Sore throat has been so severe that he has not been eating/drinking much and presents today with an ARF. Will admit for IVF. Treat URI symptomatically/ Will continue to follow. Recheck renal function in am.  Domingo Mend, MD [name] Pager: [phone]

## 2014-04-19 NOTE — ED Provider Notes (Signed)
CSN: [id number]     Arrival date & time 04/19/14  1017 History  This chart was scribed for Nat Christen, MD by Roxan Diesel, ED scribe.  This patient was seen in room APA07/AP[facility]07 and the patient's care was started at 11:15 AM.   Chief Complaint  Patient presents with  . Sore Throat    The history is provided by the patient and a relative. No language interpreter was used.    HPI Comments: Douglas [name] is a 80 y.o. male with h/o epiglottitis, DM, HTN, and hyperlipidemia who presents to the Emergency Department complaining of sore throat with associated difficulty swallowing and ear pain.  Pt reports he initially developed pain in his right ear 2 days ago.  He then developed a draining sensation into his throat.  He saw his PCP that day and was started on amoxicillin.  Since then his ear pain has improved but his sore throat has progressively worsened   He states it is painful to swallow but he is able to.  He has only eaten a half cup of ice cream and a half of a popsicle in the past 2 days.  Family notes that he grabs the sides of his throat whenever he swallows.  They also report he seems more weak than usual.  Pt does not typically walk very much due to diabetic neuropathy.  Sugar was 204 yesterday and not tested today.  He has been taking his DM medications.  Sugar typically ranges from "175 to two something."  PCP is [name],[name], MD    Past Medical History  Diagnosis Date  . Cancer   . Hypertension   . Diabetes mellitus   . Hyperlipidemia   . Cataracts, bilateral   . Hyperkalemia   . UTI (lower urinary tract infection)   . Calculus of kidney   . Sensorineural hearing loss, unspecified   . Epiglottitis     History reviewed. No pertinent past surgical history.  History reviewed. No pertinent family history.   History  Substance Use Topics  . Smoking status: Never Smoker   . Smokeless tobacco: Not on file  . Alcohol Use: No     Review of Systems A complete 10  system review of systems was obtained and all systems are negative except as noted in the HPI and PMH.     Allergies  Review of patient's allergies indicates no known allergies.  Home Medications   Prior to Admission medications   Medication Sig Start Date End Date Taking? Authorizing Provider  atorvastatin (LIPITOR) 10 MG tablet Take 1 tablet (10 mg total) by mouth daily. 06/01/13   Fay Records, MD  carvedilol (COREG) 6.25 MG tablet Take 6.25 mg by mouth 2 (two) times daily with a meal.    Historical Provider, MD  gabapentin (NEURONTIN) 300 MG capsule Take 600 mg by mouth at bedtime.    Historical Provider, MD  gemfibrozil (LOPID) 600 MG tablet Take 600 mg by mouth 2 (two) times daily before a meal.    Historical Provider, MD  insulin detemir (LEVEMIR) 100 UNIT/ML injection Inject into the skin at bedtime.    Historical Provider, MD  insulin lispro protamine-insulin lispro (HUMALOG 75/25) (75-25) 100 UNIT/ML SUSP Inject 12-16 Units into the skin 3 (three) times daily.    Historical Provider, MD  Insulin Pen Needle (NOVOFINE) 30G X 8 MM MISC Inject 1 packet into the skin as needed.    Historical Provider, MD  omega-3 acid ethyl esters (LOVAZA) 1  G capsule Take 2 capsules (2 g total) by mouth 2 (two) times daily. 06/01/13   Fay Records, MD   BP 136/71  Pulse 104  Temp(Src) 97.7 F (36.5 C) (Oral)  Resp 18  Ht 5\' 5"  (1.651 m)  Wt 145 lb (65.772 kg)  BMI 24.13 kg/m2  SpO2 96%  Physical Exam  Nursing note and vitals reviewed. Constitutional: He is oriented to person, place, and time. He appears well-developed and well-nourished.  HENT:  Head: Normocephalic and atraumatic.  Throat slightly erythematous.  Otherwise normal oropharyngeal area.  Eyes: Conjunctivae and EOM are normal. Pupils are equal, round, and reactive to light.  Neck: Normal range of motion. Neck supple.  Cardiovascular: Normal rate, regular rhythm and normal heart sounds.   Pulmonary/Chest: Effort normal and breath  sounds normal. No stridor. No respiratory distress. He has no wheezes. He has no rales.  Abdominal: Soft. Bowel sounds are normal.  Musculoskeletal: Normal range of motion.  Lymphadenopathy:    He has no cervical adenopathy.  Neurological: He is alert and oriented to person, place, and time.  Skin: Skin is warm and dry.  Psychiatric: He has a normal mood and affect. His behavior is normal.    ED Course  Procedures (including critical care time)  DIAGNOSTIC STUDIES: Oxygen Saturation is 96% on room air, normal by my interpretation.    COORDINATION OF CARE: 11:20 AM-Discussed treatment plan which includes labs, strep test, and IV fluids with pt at bedside and pt agreed to plan.    Results for orders placed during the hospital encounter of 04/19/14  RAPID STREP SCREEN      Result Value Ref Range   Streptococcus, Group A Screen (Direct) NEGATIVE  NEGATIVE  BASIC METABOLIC PANEL      Result Value Ref Range   Sodium 138  137 - 147 mEq/L   Potassium 5.7 (*) 3.7 - 5.3 mEq/L   Chloride 102  96 - 112 mEq/L   CO2 20  19 - 32 mEq/L   Glucose, Bld 158 (*) 70 - 99 mg/dL   BUN 45 (*) 6 - 23 mg/dL   Creatinine, Ser 2.70 (*) 0.50 - 1.35 mg/dL   Calcium 9.8  8.4 - 10.5 mg/dL   GFR calc non Af Amer 21 (*) >90 mL/min   GFR calc Af Amer 24 (*) >90 mL/min  CBC WITH DIFFERENTIAL      Result Value Ref Range   WBC 8.2  4.0 - 10.5 K/uL   RBC 4.20 (*) 4.22 - 5.81 MIL/uL   Hemoglobin 13.0  13.0 - 17.0 g/dL   HCT 37.8 (*) 39.0 - 52.0 %   MCV 90.0  78.0 - 100.0 fL   MCH 31.0  26.0 - 34.0 pg   MCHC 34.4  30.0 - 36.0 g/dL   RDW 13.2  11.5 - 15.5 %   Platelets 127 (*) 150 - 400 K/uL   Neutrophils Relative % 64  43 - 77 %   Neutro Abs 5.2  1.7 - 7.7 K/uL   Lymphocytes Relative 22  12 - 46 %   Lymphs Abs 1.8  0.7 - 4.0 K/uL   Monocytes Relative 12  3 - 12 %   Monocytes Absolute 1.0  0.1 - 1.0 K/uL   Eosinophils Relative 2  0 - 5 %   Eosinophils Absolute 0.2  0.0 - 0.7 K/uL   Basophils Relative 1   0 - 1 %   Basophils Absolute 0.1  0.0 - 0.1 K/uL  WBC Morphology ATYPICAL LYMPHOCYTES    URINALYSIS, ROUTINE W REFLEX MICROSCOPIC      Result Value Ref Range   Color, Urine YELLOW  YELLOW   APPearance CLEAR  CLEAR   Specific Gravity, Urine >1.030 (*) 1.005 - 1.030   pH 5.5  5.0 - 8.0   Glucose, UA NEGATIVE  NEGATIVE mg/dL   Hgb urine dipstick MODERATE (*) NEGATIVE   Bilirubin Urine NEGATIVE  NEGATIVE   Ketones, ur NEGATIVE  NEGATIVE mg/dL   Protein, ur >300 (*) NEGATIVE mg/dL   Urobilinogen, UA 0.2  0.0 - 1.0 mg/dL   Nitrite NEGATIVE  NEGATIVE   Leukocytes, UA NEGATIVE  NEGATIVE  MONONUCLEOSIS SCREEN      Result Value Ref Range   Mono Screen NEGATIVE  NEGATIVE  URINE MICROSCOPIC-ADD ON      Result Value Ref Range   Squamous Epithelial / LPF FEW (*) RARE   WBC, UA 3-6  <3 WBC/hpf   RBC / HPF 3-6  <3 RBC/hpf   Bacteria, UA MANY (*) RARE     MDM   Final diagnoses:  Pharyngitis  Renal insufficiency  Hyperkalemia    Patient appears dehydrated. Urine specific gravity, creatinine, potassium all elevated. Rapid strep and mono tests negative. Will hydrate.  Admit to observation   I personally performed the services described in this documentation, which was scribed in my presence. The recorded information has been reviewed and is accurate.     Nat Christen, MD 04/19/14 1434

## 2014-04-19 NOTE — ED Notes (Signed)
Has had same and was told he had epiglottitis.

## 2014-04-20 ENCOUNTER — Observation Stay (HOSPITAL_COMMUNITY): Payer: Medicare Other

## 2014-04-20 LAB — GLUCOSE, CAPILLARY
GLUCOSE-CAPILLARY: 189 mg/dL — AB (ref 70–99)
Glucose-Capillary: 166 mg/dL — ABNORMAL HIGH (ref 70–99)
Glucose-Capillary: 188 mg/dL — ABNORMAL HIGH (ref 70–99)
Glucose-Capillary: 190 mg/dL — ABNORMAL HIGH (ref 70–99)

## 2014-04-20 LAB — BASIC METABOLIC PANEL
BUN: 51 mg/dL — ABNORMAL HIGH (ref 6–23)
BUN: 53 mg/dL — ABNORMAL HIGH (ref 6–23)
CALCIUM: 8 mg/dL — AB (ref 8.4–10.5)
CO2: 16 meq/L — AB (ref 19–32)
CO2: 17 mEq/L — ABNORMAL LOW (ref 19–32)
CREATININE: 2.87 mg/dL — AB (ref 0.50–1.35)
Calcium: 8 mg/dL — ABNORMAL LOW (ref 8.4–10.5)
Chloride: 105 mEq/L (ref 96–112)
Chloride: 109 mEq/L (ref 96–112)
Creatinine, Ser: 3.12 mg/dL — ABNORMAL HIGH (ref 0.50–1.35)
GFR calc Af Amer: 22 mL/min — ABNORMAL LOW (ref >90)
GFR calc Af Amer: 20 mL/min — ABNORMAL LOW (ref >90)
GFR calc non Af Amer: 19 mL/min — ABNORMAL LOW (ref >90)
GFR calc non Af Amer: 17 mL/min — ABNORMAL LOW (ref >90)
GLUCOSE: 232 mg/dL — AB (ref 70–99)
Glucose, Bld: 224 mg/dL — ABNORMAL HIGH (ref 70–99)
Potassium: 6.6 mEq/L (ref 3.7–5.3)
Potassium: 5.6 mEq/L — ABNORMAL HIGH (ref 3.7–5.3)
Sodium: 135 mEq/L — ABNORMAL LOW (ref 137–147)
Sodium: 140 mEq/L (ref 137–147)

## 2014-04-20 LAB — CBC
HCT: 32.9 % — ABNORMAL LOW (ref 39.0–52.0)
Hemoglobin: 11 g/dL — ABNORMAL LOW (ref 13.0–17.0)
MCH: 31 pg (ref 26.0–34.0)
MCHC: 33.4 g/dL (ref 30.0–36.0)
MCV: 92.7 fL (ref 78.0–100.0)
Platelets: 106 10*3/uL — ABNORMAL LOW (ref 150–400)
RBC: 3.55 MIL/uL — ABNORMAL LOW (ref 4.22–5.81)
RDW: 12.9 % (ref 11.5–15.5)
WBC: 9.5 10*3/uL (ref 4.0–10.5)

## 2014-04-20 LAB — HEMOGLOBIN A1C
HEMOGLOBIN A1C: 9 % — AB (ref <5.7)
MEAN PLASMA GLUCOSE: 212 mg/dL — AB (ref <117)

## 2014-04-20 LAB — CK: Total CK: 197 U/L (ref 7–232)

## 2014-04-20 MED ORDER — INSULIN ASPART 100 UNIT/ML ~~LOC~~ SOLN
10.0000 [IU] | Freq: Once | SUBCUTANEOUS | Status: AC
  Administered 2014-04-20: 10 [IU] via INTRAVENOUS

## 2014-04-20 MED ORDER — MAGIC MOUTHWASH W/LIDOCAINE: 5.0000 mL | Freq: Four times a day (QID) | ORAL | Status: DC

## 2014-04-20 MED ORDER — DEXTROSE 50 % IV SOLN
INTRAVENOUS | Status: AC
  Filled 2014-04-20: qty 50

## 2014-04-20 MED ORDER — SODIUM POLYSTYRENE SULFONATE 15 GM/60ML PO SUSP
30.0000 g | Freq: Once | ORAL | Status: AC
  Administered 2014-04-20: 30 g via ORAL
  Filled 2014-04-20: qty 120

## 2014-04-20 MED ORDER — DEXTROSE 50 % IV SOLN
1.0000 | Freq: Once | INTRAVENOUS | Status: AC
  Administered 2014-04-20: 50 mL via INTRAVENOUS

## 2014-04-20 MED ORDER — MAGIC MOUTHWASH
5.0000 mL | Freq: Four times a day (QID) | ORAL | Status: DC
  Administered 2014-04-20 – 2014-04-23 (×10): 5 mL via ORAL
  Filled 2014-04-20 (×10): qty 5

## 2014-04-20 MED ORDER — LIDOCAINE VISCOUS 2 % MT SOLN
5.0000 mL | Freq: Four times a day (QID) | OROMUCOSAL | Status: DC
  Administered 2014-04-20 – 2014-04-23 (×10): 5 mL via OROMUCOSAL
  Filled 2014-04-20 (×9): qty 15

## 2014-04-20 MED ORDER — FUROSEMIDE 10 MG/ML IJ SOLN
40.0000 mg | Freq: Two times a day (BID) | INTRAMUSCULAR | Status: DC
  Administered 2014-04-20 – 2014-04-21 (×2): 40 mg via INTRAVENOUS
  Filled 2014-04-20 (×2): qty 4

## 2014-04-20 MED ORDER — SODIUM BICARBONATE 8.4 % IV SOLN
INTRAVENOUS | Status: DC
  Administered 2014-04-20: 19:00:00 via INTRAVENOUS
  Filled 2014-04-20 (×9): qty 100

## 2014-04-20 MED ORDER — HYDRALAZINE HCL 20 MG/ML IJ SOLN
10.0000 mg | Freq: Four times a day (QID) | INTRAMUSCULAR | Status: DC | PRN
  Administered 2014-04-20 – 2014-04-23 (×2): 10 mg via INTRAVENOUS
  Filled 2014-04-20 (×2): qty 1

## 2014-04-20 NOTE — Consult Note (Signed)
Reason for Consult: Acute kidney injury Referring Physician: Dr. Porfirio Oar is an 80 y.o. male.  HPI: He's patient wants history of long-standing diabetes, hypertension, history of kidney stone status post ESWL presently brought by his family's because of generalized weakness, unable to eat, ear ache for the last couple of days. According to the patient and his wife patient was having some sore throat and ear ache. He was seen by his primary care physician on Tuesday and was put on amoxicillin. He has no history ear ache was slightly better patient continues to have sore throat unable to eat and drink. Presently patient also complains of headache. Patient and his family's, are not aware of any problem with his kidneys. Patient however seems to have elevated creatinine towards the last 4 years. Patient also was prostate cancer.  Past Medical History  Diagnosis Date  . Cancer   . Hypertension   . Diabetes mellitus   . Hyperlipidemia   . Cataracts, bilateral   . Hyperkalemia   . UTI (lower urinary tract infection)   . Calculus of kidney   . Sensorineural hearing loss, unspecified   . Epiglottitis     History reviewed. No pertinent past surgical history.  History reviewed. No pertinent family history.  Social History:  reports that he has never smoked. He does not have any smokeless tobacco history on file. He reports that he does not drink alcohol or use illicit drugs.  Allergies: No Known Allergies  Medications: I have reviewed the patient's current medications.  Results for orders placed during the hospital encounter of 04/19/14 (from the past 48 hour(s))  BASIC METABOLIC PANEL     Status: Abnormal   Collection Time    04/19/14 11:39 AM      Result Value Ref Range   Sodium 138  137 - 147 mEq/L   Potassium 5.7 (*) 3.7 - 5.3 mEq/L   Chloride 102  96 - 112 mEq/L   CO2 20  19 - 32 mEq/L   Glucose, Bld 158 (*) 70 - 99 mg/dL   BUN 45 (*) 6 - 23 mg/dL   Creatinine,  Ser 2.70 (*) 0.50 - 1.35 mg/dL   Calcium 9.8  8.4 - 10.5 mg/dL   GFR calc non Af Amer 21 (*) >90 mL/min   GFR calc Af Amer 24 (*) >90 mL/min   Comment: (NOTE)     The eGFR has been calculated using the CKD EPI equation.     This calculation has not been validated in all clinical situations.     eGFR's persistently <90 mL/min signify possible Chronic Kidney     Disease.  CBC WITH DIFFERENTIAL     Status: Abnormal   Collection Time    04/19/14 11:39 AM      Result Value Ref Range   WBC 8.2  4.0 - 10.5 K/uL   RBC 4.20 (*) 4.22 - 5.81 MIL/uL   Hemoglobin 13.0  13.0 - 17.0 g/dL   HCT 37.8 (*) 39.0 - 52.0 %   MCV 90.0  78.0 - 100.0 fL   MCH 31.0  26.0 - 34.0 pg   MCHC 34.4  30.0 - 36.0 g/dL   RDW 13.2  11.5 - 15.5 %   Platelets 127 (*) 150 - 400 K/uL   Neutrophils Relative % 64  43 - 77 %   Neutro Abs 5.2  1.7 - 7.7 K/uL   Lymphocytes Relative 22  12 - 46 %   Lymphs Abs 1.8  0.7 - 4.0 K/uL   Monocytes Relative 12  3 - 12 %   Monocytes Absolute 1.0  0.1 - 1.0 K/uL   Eosinophils Relative 2  0 - 5 %   Eosinophils Absolute 0.2  0.0 - 0.7 K/uL   Basophils Relative 1  0 - 1 %   Basophils Absolute 0.1  0.0 - 0.1 K/uL   WBC Morphology ATYPICAL LYMPHOCYTES    MONONUCLEOSIS SCREEN     Status: None   Collection Time    04/19/14 11:39 AM      Result Value Ref Range   Mono Screen NEGATIVE  NEGATIVE  HEMOGLOBIN A1C     Status: Abnormal   Collection Time    04/19/14 11:39 AM      Result Value Ref Range   Hemoglobin A1C 9.0 (*) <5.7 %   Comment: (NOTE)                                                                               According to the ADA Clinical Practice Recommendations for 2011, when     HbA1c is used as a screening test:      >=6.5%   Diagnostic of Diabetes Mellitus               (if abnormal result is confirmed)     5.7-6.4%   Increased risk of developing Diabetes Mellitus     References:Diagnosis and Classification of Diabetes Mellitus,Diabetes     LPFX,9024,09(BDZHG  1):S62-S69 and Standards of Medical Care in             Diabetes - 2011,Diabetes Care,2011,34 (Suppl 1):S11-S61.   Mean Plasma Glucose 212 (*) <117 mg/dL   Comment: Performed at [facility]     Status: None   Collection Time    04/19/14 11:50 AM      Result Value Ref Range   Streptococcus, Group A Screen (Direct) NEGATIVE  NEGATIVE   Comment: (NOTE)     A Rapid Antigen test may result negative if the antigen level in the     sample is below the detection level of this test. The FDA has not     cleared this test as a stand-alone test therefore the rapid antigen     negative result has reflexed to a Group A Strep culture.  CULTURE, GROUP A STREP     Status: None   Collection Time    04/19/14 11:50 AM      Result Value Ref Range   Specimen Description THROAT     Special Requests NONE     Culture       Value: NO SUSPICIOUS COLONIES, CONTINUING TO HOLD     Performed at [facility]   Report Status PENDING    URINALYSIS, ROUTINE W REFLEX MICROSCOPIC     Status: Abnormal   Collection Time    04/19/14 12:57 PM      Result Value Ref Range   Color, Urine YELLOW  YELLOW   APPearance CLEAR  CLEAR   Specific Gravity, Urine >1.030 (*) 1.005 - 1.030   pH 5.5  5.0 - 8.0   Glucose, UA NEGATIVE  NEGATIVE mg/dL  Hgb urine dipstick MODERATE (*) NEGATIVE   Bilirubin Urine NEGATIVE  NEGATIVE   Ketones, ur NEGATIVE  NEGATIVE mg/dL   Protein, ur >300 (*) NEGATIVE mg/dL   Urobilinogen, UA 0.2  0.0 - 1.0 mg/dL   Nitrite NEGATIVE  NEGATIVE   Leukocytes, UA NEGATIVE  NEGATIVE  URINE MICROSCOPIC-ADD ON     Status: Abnormal   Collection Time    04/19/14 12:57 PM      Result Value Ref Range   Squamous Epithelial / LPF FEW (*) RARE   WBC, UA 3-6  <3 WBC/hpf   RBC / HPF 3-6  <3 RBC/hpf   Bacteria, UA MANY (*) RARE  CBG MONITORING, ED     Status: None   Collection Time    04/19/14  4:32 PM      Result Value Ref Range   Glucose-Capillary 97  70 - 99 mg/dL   GLUCOSE, CAPILLARY     Status: Abnormal   Collection Time    04/19/14  9:46 PM      Result Value Ref Range   Glucose-Capillary 133 (*) 70 - 99 mg/dL   Comment 1 Notify RN    BASIC METABOLIC PANEL     Status: Abnormal   Collection Time    04/19/14  9:53 PM      Result Value Ref Range   Sodium 136 (*) 137 - 147 mEq/L   Potassium 6.3 (*) 3.7 - 5.3 mEq/L   Chloride 104  96 - 112 mEq/L   CO2 18 (*) 19 - 32 mEq/L   Glucose, Bld 149 (*) 70 - 99 mg/dL   BUN 50 (*) 6 - 23 mg/dL   Creatinine, Ser 2.81 (*) 0.50 - 1.35 mg/dL   Calcium 8.4  8.4 - 10.5 mg/dL   GFR calc non Af Amer 20 (*) >90 mL/min   GFR calc Af Amer 23 (*) >90 mL/min   Comment: (NOTE)     The eGFR has been calculated using the CKD EPI equation.     This calculation has not been validated in all clinical situations.     eGFR's persistently <90 mL/min signify possible Chronic Kidney     Disease.  GLUCOSE, CAPILLARY     Status: Abnormal   Collection Time    04/20/14  7:21 AM      Result Value Ref Range   Glucose-Capillary 189 (*) 70 - 99 mg/dL  BASIC METABOLIC PANEL     Status: Abnormal   Collection Time    04/20/14  8:05 AM      Result Value Ref Range   Sodium 135 (*) 137 - 147 mEq/L   Potassium 6.6 (*) 3.7 - 5.3 mEq/L   Comment: CRITICAL RESULT CALLED TO, READ BACK BY AND VERIFIED WITH:     KNIGHT,C AT 8:30AM ON 04/20/14 BY FESTERMAN,C   Chloride 105  96 - 112 mEq/L   CO2 16 (*) 19 - 32 mEq/L   Glucose, Bld 224 (*) 70 - 99 mg/dL   BUN 51 (*) 6 - 23 mg/dL   Creatinine, Ser 2.87 (*) 0.50 - 1.35 mg/dL   Calcium 8.0 (*) 8.4 - 10.5 mg/dL   GFR calc non Af Amer 19 (*) >90 mL/min   GFR calc Af Amer 22 (*) >90 mL/min   Comment: (NOTE)     The eGFR has been calculated using the CKD EPI equation.     This calculation has not been validated in all clinical situations.     eGFR's persistently <90  mL/min signify possible Chronic Kidney     Disease.  CBC     Status: Abnormal   Collection Time    04/20/14  8:05 AM       Result Value Ref Range   WBC 9.5  4.0 - 10.5 K/uL   RBC 3.55 (*) 4.22 - 5.81 MIL/uL   Hemoglobin 11.0 (*) 13.0 - 17.0 g/dL   HCT 32.9 (*) 39.0 - 52.0 %   MCV 92.7  78.0 - 100.0 fL   MCH 31.0  26.0 - 34.0 pg   MCHC 33.4  30.0 - 36.0 g/dL   RDW 12.9  11.5 - 15.5 %   Platelets 106 (*) 150 - 400 K/uL   Comment: CONSISTENT WITH PREVIOUS RESULT  GLUCOSE, CAPILLARY     Status: Abnormal   Collection Time    04/20/14 11:38 AM      Result Value Ref Range   Glucose-Capillary 166 (*) 70 - 99 mg/dL  BASIC METABOLIC PANEL     Status: Abnormal   Collection Time    04/20/14  2:16 PM      Result Value Ref Range   Sodium 140  137 - 147 mEq/L   Potassium 5.6 (*) 3.7 - 5.3 mEq/L   Chloride 109  96 - 112 mEq/L   CO2 17 (*) 19 - 32 mEq/L   Glucose, Bld 232 (*) 70 - 99 mg/dL   BUN 53 (*) 6 - 23 mg/dL   Creatinine, Ser 3.12 (*) 0.50 - 1.35 mg/dL   Calcium 8.0 (*) 8.4 - 10.5 mg/dL   GFR calc non Af Amer 17 (*) >90 mL/min   GFR calc Af Amer 20 (*) >90 mL/min   Comment: (NOTE)     The eGFR has been calculated using the CKD EPI equation.     This calculation has not been validated in all clinical situations.     eGFR's persistently <90 mL/min signify possible Chronic Kidney     Disease.  GLUCOSE, CAPILLARY     Status: Abnormal   Collection Time    04/20/14  4:29 PM      Result Value Ref Range   Glucose-Capillary 188 (*) 70 - 99 mg/dL  CK     Status: None   Collection Time    04/20/14  4:36 PM      Result Value Ref Range   Total CK 197  7 - 232 U/L    Ct Soft Tissue Neck Wo Contrast  04/20/2014   CLINICAL DATA:  Difficulty swallowing.  Elevated creatinine.  EXAM: CT NECK WITHOUT CONTRAST  TECHNIQUE: Multidetector CT imaging of the neck was performed following the standard protocol without intravenous contrast.  COMPARISON:  None.  FINDINGS: Examination is limited by lack of IV contrast.  Visualized portion of the brain is grossly unremarkable. Prior right cataract surgery is noted. Visualized  paranasal sinuses and mastoid air cells are clear.  Bilateral tonsilliths are noted. Oropharynx, oral cavity, and nasopharynx are otherwise grossly unremarkable without gross mass identified. The larynx is unremarkable. Thyroid and submandibular and parotid glands are normal in appearance.  No enlarged cervical lymph nodes are identified. Moderate right and mild left carotid bifurcation calcification is noted. Visualized lung apices are grossly clear. Advanced multilevel degenerative disc disease is seen, greatest at C4-5 where there is likely severe spinal stenosis.  IMPRESSION: 1. No cervical mass or enlarged lymph nodes identified. 2. Advanced multilevel degenerative disc disease of the cervical spine.   Electronically Signed   By: Zenia Resides  Jeralyn Ruths   On: 04/20/2014 18:00   US Renal  04/20/2014   CLINICAL DATA:  Acute renal failure.  EXAM: RENAL/URINARY TRACT ULTRASOUND COMPLETE  COMPARISON:  CT abdomen and pelvis 09/18/2013 and renal ultrasound 10/27/2007  FINDINGS: Right Kidney:  Length: 10.5 cm. 9 mm stone was again seen in the lower pole. No hydronephrosis or solid mass identified.  Left Kidney:  Length: 10.2 cm. Echogenicity within normal limits. No mass or hydronephrosis visualized.  A small amount of perinephric fluid was questioned bilaterally and may correspond to the stranding seen on prior CT.  Bladder:  Not identified.  Foley catheter in place.  IMPRESSION: 9 mm right lower pole stone.  No hydronephrosis.   Electronically Signed   By: Logan Bores   On: 04/20/2014 17:13    Review of Systems  HENT: Positive for ear pain, sore throat and tinnitus.   Respiratory: Positive for cough and sputum production. Negative for hemoptysis and shortness of breath.   Gastrointestinal: Positive for vomiting. Negative for nausea.       Patient unable to eat or drink because of sore throat the last couple of days.  Neurological: Positive for weakness.   Blood pressure 129/61, pulse 81, temperature 100.8 F (38.2  C), temperature source Rectal, resp. rate 20, height _0  (1.651 m), weight 65.772 kg (145 lb), SpO2 94.00%. Physical Exam  Constitutional: No distress.  Eyes: Left eye exhibits no discharge.  Neck: No JVD present.  Cardiovascular: Normal rate and regular rhythm.   Respiratory: No respiratory distress. He has no wheezes.  GI: He exhibits no distension. There is no tenderness. There is no rebound.  Musculoskeletal: He exhibits no edema.    Assessment/Plan: Problem #1 acute kidney injury: His BUN and creatinine subsequently height and his baseline of 1.68. This increase could be secondary to dehydration because of patient's poor by mouth intake in dorsal high urine specimen . However presently allergic nephritis from amoxicillin in and ATN can not be ruled out Problem #2 chronic renal failure colon. His creatinine was 1.48 on 10/03/2010, increased to 1.64 on 8/3/ 2012.  During that time his  EGFR was 46 cc per minute. Hence patient has stage IIIa chronic renal  failure. The etiology could be secondary to focal segmental nephrosclerosis associated with his kidney stone/diabetes/hypertension. Problem #3 proteinuria: This could be secondary to diabetes however other etiologies has this moment cannot ruled out. Problem #4 hyperkalemia: With likely from the above Problem #5 diabetes Problem #6 hypertension Problem #7 anemia Problem #8 Upper respiratory tract infection Problem #9 prostate CA patient being followed by urology. Problem #10 history of kidney stone Plan: We'll increase his IV fluid to 135 cc per hour We'll use Lasix 40 mg IV twice a day to control his potassium We'll check ANA, hepatitis B surface antigen, hepatitis C antibody We'll check his complement We'll check ASO titer We'll check urine sodium and creatinine We'll check for Eosinophils in the urine We'll follow his blood work. We'll check his vitamin D level, intact PTH and phosphorus in the morning.  Gevena Cotton  Heitor Steinhoff 04/20/2014, 6:12 PM

## 2014-04-20 NOTE — Progress Notes (Signed)
[name] PROGRESS NOTE  Douglas [name] MEQ:[id number] DOB: [birth year] DOA: 04/19/2014 PCP: [name]., MD   Summary: 80 year old admitted with acute renal failure, hyperkalemia related to dehydration due to acute URI.    Assessment/Plan: Principal Problem:  Acute renal failure; likely related to dehydration secondary to acute illness. Worsening this am.  Will provide vigorous IV fluids. Will hold any nephrotoxins. Will monitor his urine output. Will recheck in the a.m.  Active Problems:  Hyperkalemia: worsening this am.  Related to #1. Continue  IV fluids. Provide insulin IV with D50 and kayexalate. recheck bemet this afternoon.  Sore throat and laryngitis: worsening this am. Hx eppiglotitis and recent ear ache treated with amoxicillian. Rapid strep negative. Await mono cultures. Await respiratory viral panel. Worsening erythema in throat on exam. Small white patch on left.(?thrush). Will start nyastatin. Left ear clear.no leukocytosis, max temp 100.6. Cepacol lozenges. Soft diet . CT of neck. Monitor  Dehydration: see #1. Related to decreased by mouth intake due to sore throat. Will support with IV fluids. Will provide soft diet..   Diabetes: CBG 133-189.  Continue sliding scale insulin for optimal glycemic control. A1c 9.0. Modified   Tachycardia: related to #3 and #4. Continue IV fluids. Continue BB  Hypertension: Only fair control. Patient is on Coreg at home we will continue this.   Hyperlipidemia: Reports being on a statin in the past but has not taken it recently. Unclear as to why.   Thrombocytopenia: No history of same. Likely related to acute illness. No signs symptoms of bleeding. Will monitor    Code Status: full Family Communication: wife and daughter at bedside Disposition Plan: home when ready   Consultants:  none  Procedures:    Antibiotics:  none  HPI/Subjective: Alert, restless complains of worsening sore throat and bilateral ear  pain  Objective: Filed Vitals:   04/20/14 0439  BP:   Pulse:   Temp: 99.8 F (37.7 C)  Resp:    No intake or output data in the 24 hours ending 04/20/14 1002 Filed Weights   04/19/14 1026  Weight: 65.772 kg (145 lb)    Exam:   General:  Well nourished restless  Cardiovascular: tachycardic but regular, no m/g/r PPP  Respiratory: normal effort BS clear bilaterally no wheeze  Abdomen: flat non-distended +BS non-tender  Musculoskeletal: no clubbing or cyanosis   Data Reviewed: Basic Metabolic Panel:  Recent Labs Lab 04/19/14 1139 04/19/14 2153 04/20/14 0805  NA 138 136* 135*  K 5.7* 6.3* 6.6*  CL 102 104 105  CO2 20 18* 16*  GLUCOSE 158* 149* 224*  BUN 45* 50* 51*  CREATININE 2.70* 2.81* 2.87*  CALCIUM 9.8 8.4 8.0*   Liver Function Tests: No results found for this basename: AST, ALT, ALKPHOS, BILITOT, PROT, ALBUMIN,  in the last 168 hours No results found for this basename: LIPASE, AMYLASE,  in the last 168 hours No results found for this basename: AMMONIA,  in the last 168 hours CBC:  Recent Labs Lab 04/19/14 1139 04/20/14 0805  WBC 8.2 9.5  NEUTROABS 5.2  --   HGB 13.0 11.0*  HCT 37.8* 32.9*  MCV 90.0 92.7  PLT 127* 106*   Cardiac Enzymes: No results found for this basename: CKTOTAL, CKMB, CKMBINDEX, TROPONINI,  in the last 168 hours BNP (last 3 results) No results found for this basename: PROBNP,  in the last 8760 hours CBG:  Recent Labs Lab 04/19/14 1632 04/19/14 2146 04/20/14 0721  GLUCAP 97 133* 189*  Recent Results (from the past 240 hour(s))  RAPID STREP SCREEN     Status: None   Collection Time    04/19/14 11:50 AM      Result Value Ref Range Status   Streptococcus, Group A Screen (Direct) NEGATIVE  NEGATIVE Final   Comment: (NOTE)     A Rapid Antigen test may result negative if the antigen level in the     sample is below the detection level of this test. The FDA has not     cleared this test as a stand-alone test  therefore the rapid antigen     negative result has reflexed to a Group A Strep culture.     Studies: No results found.  Scheduled Meds: . carvedilol  6.25 mg Oral BID WC  . enoxaparin (LOVENOX) injection  30 mg Subcutaneous Q24H  . gabapentin  600 mg Oral QHS  . gemfibrozil  600 mg Oral BID AC  . insulin aspart  0-5 Units Subcutaneous QHS  . insulin aspart  0-9 Units Subcutaneous TID WC  . insulin detemir  24 Units Subcutaneous QHS   Continuous Infusions: . sodium chloride 75 mL/hr at 04/20/14 0938    Principal Problem:   Acute renal failure Active Problems:   Renal insufficiency   Hypertension   Hyperlipidemia   Hyperkalemia   Dehydration   Sore throat and laryngitis   Thrombocytopenia   Tachycardia   Diabetes    Time spent: 35 minutes    [facility] [facility] Pager [phone]. If 7PM-7AM, please contact night-coverage at [email or web address], password [facility] 04/20/2014, 10:02 AM  LOS: 1 day    Patient seen and examined, database reviewed. Discussed with family members at bedside. Agree with above note by Dyanne Carrel, NP. Concerned about worsening renal failure and hyperkalemia. Is also acidotic. Will request renal consultation, will add bicarb in fluids. Will correct potassium and recheck again this pm. Renal US ordered. CT neck ordered. Appears to have thrush. Will start magic mouthwash. Continue to follow closely.  Domingo Mend, MD [name] Pager: [phone]

## 2014-04-20 NOTE — Progress Notes (Signed)
UR completed. Patient changed to inpatient- requiring IVF @ 75cc/hr 

## 2014-04-20 NOTE — Progress Notes (Signed)
Patient with critical lab K 6.6 and complaining of severe head/ear pain. Dr. Jerilee Hoh and Santiago Glad, NP notified.

## 2014-04-20 NOTE — Evaluation (Signed)
Physical Therapy Evaluation Patient Details Name: Douglas [name] MRN: [id number] DOB: 1931-12-10 Today's Date: 04/20/2014   History of Present Illness  Pt is admitted with acute renal failure secondary to dehydration from a viral infection.  He is admitted from home where he lives with his wife.  He is normally independent at home.  He has a hx of prostate CA and is incontinent from radiation therapy.  Clinical Impression   Pt was seen for evaluation.  He is a very pleasant gentleman who had extreme left sided head/ear pain this morning.  This pain has resolved with pain meds and he is pain free at this time.  Wife states that pt had an injury to his right ankle about a month ago and it is now very painful for him to walk.  Pt has a bilateral diabetic peripheral neuropathy, but this is not the source of his pain.  The right ankle is slightly larger than the left and strength is poor but equal to the left ankle.  I instructed him in the use of a rolling walker in order to off-load weight from the RLE during weight bearing.  He now is able to ambulate with no pain for functional distances.  We will recommend HHPT to work with pt on steps at home as well as strengthening of both ankles.    Follow Up Recommendations Home health PT    Equipment Recommendations  Rolling walker with 5" wheels    Recommendations for Other Services   none    Precautions / Restrictions Precautions Precautions: Fall Restrictions Weight Bearing Restrictions: No      Mobility  Bed Mobility Overal bed mobility: Modified Independent                Transfers Overall transfer level: Modified independent                  Ambulation/Gait Ambulation/Gait assistance: Supervision Ambulation Distance (Feet): 200 Feet Assistive device: Rolling walker (2 wheeled)     Gait velocity interpretation: at or above normal speed for age/gender General Gait Details: pt was instructed in gait technique using  a walker with focus on decreasing weight bearing on the RLE  StairsN/T                Modified Rankin (Stroke Patients Only)       Balance Overall balance assessment: No apparent balance deficits (not formally assessed)                                             Home Living Family/patient expects to be discharged to:: Private residence Living Arrangements: Spouse/significant other Available Help at Discharge: Family;Available 24 hours/day Type of Home: House Home Access: Stairs to enter Entrance Stairs-Rails: Right;Left;Can reach both Entrance Stairs-Number of Steps: 6 Home Layout: One level Home Equipment: Cane - single point      Prior Function Level of Independence: Independent                       Extremity/Trunk Assessment               Lower Extremity Assessment: Overall WFL for tasks assessed (except weakness in both ankles: 2/5)         Communication   Communication: No difficulties  Cognition Arousal/Alertness: Awake/alert Behavior During Therapy: WFL for tasks assessed/performed Overall Cognitive  Status: Within Functional Limits for tasks assessed                                    Assessment/Plan    PT Assessment All further PT needs can be met in the next venue of care  PT Diagnosis Difficulty walking;Acute pain   PT Problem List Decreased strength;Decreased mobility;Pain;Decreased knowledge of use of DME;Decreased safety awareness  PT Treatment Interventions     PT Goals (Current goals can be found in the Care Plan section) Acute Rehab PT Goals PT Goal Formulation: No goals set, d/c therapy         Barriers to discharge  steps at entrance to home                     End of Session Equipment Utilized During Treatment: Gait belt Activity Tolerance: Patient tolerated treatment well Patient left: in chair;with call bell/phone within reach Nurse Communication: Mobility status     Functional Assessment Tool Used: clinical judgement Functional Limitation: Mobility: Walking and moving around Mobility: Walking and Moving Around Current Status ([id number]): At least 1 percent but less than 20 percent impaired, limited or restricted Mobility: Walking and Moving Around Goal Status [phone]): At least 1 percent but less than 20 percent impaired, limited or restricted Mobility: Walking and Moving Around Discharge Status [phone]): At least 1 percent but less than 20 percent impaired, limited or restricted    Time: 1424-1500 PT Time Calculation (min): 36 min   Charges:   PT Evaluation $Initial PT Evaluation Tier I: 1 Procedure     PT G Codes:   Functional Assessment Tool Used: clinical judgement Functional Limitation: Mobility: Walking and moving around    [employer] 04/20/2014, 3:04 PM

## 2014-04-20 NOTE — Care Management Note (Addendum)
    Page 1 of 1   04/23/2014     1:46:24 PM CARE MANAGEMENT NOTE 04/23/2014  Patient:  Douglas [name], Douglas [name]   Account Number:  [id number]  Date Initiated:  04/20/2014  Documentation initiated by:  Claretha Cooper  Subjective/Objective Assessment:   Pt lives at home with wife. Morning conversation with family revealed no identified [place] needs. PT recommended HH PT. List of agencies in [place] given to family.     Action/Plan:   Will need DME RW from [facility].   Anticipated DC Date:     Anticipated DC Plan:  [place]  CM consult      Choice offered to / List presented to:     DME arranged  [place]      DME agency  [place] arranged  HH-2 PT      Status of service:  Completed, signed off Medicare Important Message given?  YES (If response is "NO", the following Medicare IM given date fields will be blank) Date Medicare IM given:  04/23/2014 Date Additional Medicare IM given:    Discharge Disposition:  [place]  Per UR Regulation:    If discussed at Long Length of Stay Meetings, dates discussed:    Comments:  04/23/14 Claretha Cooper RN BSN CM Pt elected [facility] for PT and the rolling walker will be delivered to the home. Wife states she can safely get pt home with out walker and she requested to have it delivered to the home.  04/20/14 Claretha Cooper RN BSN CM

## 2014-04-21 LAB — CREATININE, URINE, RANDOM: Creatinine, Urine: 137.79 mg/dL

## 2014-04-21 LAB — CBC
HCT: 27.7 % — ABNORMAL LOW (ref 39.0–52.0)
HEMOGLOBIN: 9.5 g/dL — AB (ref 13.0–17.0)
MCH: 30.9 pg (ref 26.0–34.0)
MCHC: 34.3 g/dL (ref 30.0–36.0)
MCV: 90.2 fL (ref 78.0–100.0)
Platelets: 110 10*3/uL — ABNORMAL LOW (ref 150–400)
RBC: 3.07 MIL/uL — AB (ref 4.22–5.81)
RDW: 13.2 % (ref 11.5–15.5)
WBC: 8 10*3/uL (ref 4.0–10.5)

## 2014-04-21 LAB — GLUCOSE, CAPILLARY
GLUCOSE-CAPILLARY: 84 mg/dL (ref 70–99)
GLUCOSE-CAPILLARY: 147 mg/dL — AB (ref 70–99)
Glucose-Capillary: 189 mg/dL — ABNORMAL HIGH (ref 70–99)
Glucose-Capillary: 139 mg/dL — ABNORMAL HIGH (ref 70–99)

## 2014-04-21 LAB — BASIC METABOLIC PANEL
BUN: 55 mg/dL — ABNORMAL HIGH (ref 6–23)
CALCIUM: 8.3 mg/dL — AB (ref 8.4–10.5)
CO2: 26 meq/L (ref 19–32)
Chloride: 104 mEq/L (ref 96–112)
Creatinine, Ser: 3.49 mg/dL — ABNORMAL HIGH (ref 0.50–1.35)
GFR calc Af Amer: 18 mL/min — ABNORMAL LOW (ref >90)
GFR calc non Af Amer: 15 mL/min — ABNORMAL LOW (ref >90)
GLUCOSE: 112 mg/dL — AB (ref 70–99)
Potassium: 4.1 mEq/L (ref 3.7–5.3)
SODIUM: 142 meq/L (ref 137–147)

## 2014-04-21 LAB — PHOSPHORUS: Phosphorus: 4.3 mg/dL (ref 2.3–4.6)

## 2014-04-21 LAB — URINE CULTURE
Colony Count: NO GROWTH
Culture: NO GROWTH

## 2014-04-21 LAB — SODIUM, URINE, RANDOM: SODIUM UR: 87 meq/L

## 2014-04-21 LAB — CULTURE, GROUP A STREP

## 2014-04-21 LAB — HEPATITIS C ANTIBODY: HCV Ab: NEGATIVE

## 2014-04-21 LAB — HEPATITIS B SURFACE ANTIGEN: Hepatitis B Surface Ag: NEGATIVE

## 2014-04-21 MED ORDER — SODIUM CHLORIDE 0.9 % IV SOLN
INTRAVENOUS | Status: DC
Start: 2014-04-21 — End: 2014-04-23
  Administered 2014-04-21 – 2014-04-23 (×4): via INTRAVENOUS

## 2014-04-21 NOTE — Progress Notes (Signed)
Pt ambulated in hallway with independently with rolling walker. Ambulated approximately 200 feet. Tolerated well with no complaints.

## 2014-04-21 NOTE — Progress Notes (Addendum)
Subjective: Interval History: has no complaint of nausea or vomiting. Presently patient states that his appetite is good and does not have any difficulty in breathing..  Objective: Vital signs in last 24 hours: Temp:  [97.9 F (36.6 C)-100.8 F (38.2 C)] 98.9 F (37.2 C) (04/25 0523) Pulse Rate:  [71-81] 76 (04/25 0523) Resp:  [20] 20 (04/25 0523) BP: (86-139)/(46-61) 139/57 mmHg (04/25 0523) SpO2:  [92 %-98 %] 98 % (04/25 0523) Weight change:   Intake/Output from previous day: 04/24 0701 - 04/25 0700 In: 120 [P.O.:120] Out: 1600 [Urine:1600] Intake/Output this shift:    General appearance: alert, cooperative and no distress Resp: clear to auscultation bilaterally Cardio: regular rate and rhythm, S1, S2 normal, no murmur, click, rub or gallop GI: soft, non-tender; bowel sounds normal; no masses,  no organomegaly Extremities: extremities normal, atraumatic, no cyanosis or edema  Lab Results:  Recent Labs  04/20/14 0805 04/21/14 0616  WBC 9.5 8.0  HGB 11.0* 9.5*  HCT 32.9* 27.7*  PLT 106* 110*   BMET:  Recent Labs  04/20/14 1416 04/21/14 0616  NA 140 142  K 5.6* 4.1  CL 109 104  CO2 17* 26  GLUCOSE 232* 112*  BUN 53* 55*  CREATININE 3.12* 3.49*  CALCIUM 8.0* 8.3*   No results found for this basename: PTH,  in the last 72 hours Iron Studies: No results found for this basename: IRON, TIBC, TRANSFERRIN, FERRITIN,  in the last 72 hours  Studies/Results: Ct Soft Tissue Neck Wo Contrast  04/20/2014   CLINICAL DATA:  Difficulty swallowing.  Elevated creatinine.  EXAM: CT NECK WITHOUT CONTRAST  TECHNIQUE: Multidetector CT imaging of the neck was performed following the standard protocol without intravenous contrast.  COMPARISON:  None.  FINDINGS: Examination is limited by lack of IV contrast.  Visualized portion of the brain is grossly unremarkable. Prior right cataract surgery is noted. Visualized paranasal sinuses and mastoid air cells are clear.  Bilateral  tonsilliths are noted. Oropharynx, oral cavity, and nasopharynx are otherwise grossly unremarkable without gross mass identified. The larynx is unremarkable. Thyroid and submandibular and parotid glands are normal in appearance.  No enlarged cervical lymph nodes are identified. Moderate right and mild left carotid bifurcation calcification is noted. Visualized lung apices are grossly clear. Advanced multilevel degenerative disc disease is seen, greatest at C4-5 where there is likely severe spinal stenosis.  IMPRESSION: 1. No cervical mass or enlarged lymph nodes identified. 2. Advanced multilevel degenerative disc disease of the cervical spine.   Electronically Signed   By: Logan Bores   On: 04/20/2014 18:00   US Renal  04/20/2014   CLINICAL DATA:  Acute renal failure.  EXAM: RENAL/URINARY TRACT ULTRASOUND COMPLETE  COMPARISON:  CT abdomen and pelvis 09/18/2013 and renal ultrasound 10/27/2007  FINDINGS: Right Kidney:  Length: 10.5 cm. 9 mm stone was again seen in the lower pole. No hydronephrosis or solid mass identified.  Left Kidney:  Length: 10.2 cm. Echogenicity within normal limits. No mass or hydronephrosis visualized.  A small amount of perinephric fluid was questioned bilaterally and may correspond to the stranding seen on prior CT.  Bladder:  Not identified.  Foley catheter in place.  IMPRESSION: 9 mm right lower pole stone.  No hydronephrosis.   Electronically Signed   By: Logan Bores   On: 04/20/2014 17:13    I have reviewed the patient's current medications.  Assessment/Plan: Problem #1 acute kidney injury: His BUN and creatinine is increasing. This could be secondary to prerenal syndrome/ATN/interstitial nephritis.  Presently patient is a symptomatic. Patient also none oliguric. Problem #2 chronic kidney disease: Possibly stage IIIa. Ultrasound with small perinephric stranding. Since patient has history of kidney stone possibly pyelonephritis. Presently he doesn't have any obstruction. Hence  the contribute to his chronic renal failure. However diabetes and hypertension also need to be put into the differential diagnosis. Problem #3 hypertension his blood pressure is reasonably controlled Problem #4 diabetes Problem #5 hyperkalemia: Potassium has corrected Problem #6 metabolic bone disease: His calcium and phosphorus is in range. Problem #7 low CO2 presently has corrected. Problem #8 sore throat and laryngitis: Presently getting better and patient seems to be tolerate drinking. Problem #9 anemia: His hemoglobin and hematocrit is declining possibly from hydration.  Plan: We'll DC IV fluids with sodium bicarbonate We'll change his IV fluid to normal saline at 135 cc per hour We'll check his iron studies in the morning. We'll check his basic metabolic panel and CBC in the morning.   LOS: 2 days   Reagen Haberman S Harriet Bollen 04/21/2014,9:31 AM

## 2014-04-21 NOTE — Progress Notes (Signed)
[name] PROGRESS NOTE  Douglas [name] TIR:[id number] DOB: Dec 11, 1931 DOA: 04/19/2014 PCP: [name]., MD  Assessment/Plan: Pharyngitis/Laryngitis/Oral Thrush -Improving. -Magic mouthwash. -CT neck without abnormalities.  Hyperkalemia -Resolved.  ARF on CKD at least stage III -Cr continues to rise. -No uremic signs. -?Acute interstitial Nephritis given recent treatment with amoxicillin. -Appreciate renal following with recommendations. -Recheck renal function in am.  DM -Fair control. -Continue current regimen.  HTN -Well controlled.   Code Status: Full Code Family Communication: Multiple family members at bedside updated on plan of care.  Disposition Plan: Home when ready   Consultants:  Renal   Antibiotics:  None   Subjective: Feels much better today. Was able to tolerate entire breakfast,  Objective: Filed Vitals:   04/20/14 1350 04/20/14 1604 04/20/14 2200 04/21/14 0523  BP: 129/61  96/46 139/57  Pulse: 81  71 76  Temp: 98.2 F (36.8 C) 100.8 F (38.2 C) 97.9 F (36.6 C) 98.9 F (37.2 C)  TempSrc: Oral Rectal Oral Oral  Resp: 20  20 20   Height:      Weight:      SpO2: 94%  97% 98%    Intake/Output Summary (Last 24 hours) at 04/21/14 1145 Last data filed at 04/21/14 1125  Gross per 24 hour  Intake    240 ml  Output   2250 ml  Net  -2010 ml   Filed Weights   04/19/14 1026  Weight: 65.772 kg (145 lb)    Exam:   General:  AA Ox3, NAD  Cardiovascular: RRR  Respiratory: CTA B  Abdomen: S/NT/ND/+BS  Extremities: no C/C/E   Neurologic:  Non-focal  Data Reviewed: Basic Metabolic Panel:  Recent Labs Lab 04/19/14 1139 04/19/14 2153 04/20/14 0805 04/20/14 1416 04/21/14 0616  NA 138 136* 135* 140 142  K 5.7* 6.3* 6.6* 5.6* 4.1  CL 102 104 105 109 104  CO2 20 18* 16* 17* 26  GLUCOSE 158* 149* 224* 232* 112*  BUN 45* 50* 51* 53* 55*  CREATININE 2.70* 2.81* 2.87* 3.12* 3.49*  CALCIUM 9.8 8.4 8.0* 8.0*  8.3*  PHOS  --   --   --   --  4.3   Liver Function Tests: No results found for this basename: AST, ALT, ALKPHOS, BILITOT, PROT, ALBUMIN,  in the last 168 hours No results found for this basename: LIPASE, AMYLASE,  in the last 168 hours No results found for this basename: AMMONIA,  in the last 168 hours CBC:  Recent Labs Lab 04/19/14 1139 04/20/14 0805 04/21/14 0616  WBC 8.2 9.5 8.0  NEUTROABS 5.2  --   --   HGB 13.0 11.0* 9.5*  HCT 37.8* 32.9* 27.7*  MCV 90.0 92.7 90.2  PLT 127* 106* 110*   Cardiac Enzymes:  Recent Labs Lab 04/20/14 1636  CKTOTAL 197   BNP (last 3 results) No results found for this basename: PROBNP,  in the last 8760 hours CBG:  Recent Labs Lab 04/20/14 0721 04/20/14 1138 04/20/14 1629 04/20/14 2149 04/21/14 0809  GLUCAP 189* 166* 188* 190* 84    Recent Results (from the past 240 hour(s))  RAPID STREP SCREEN     Status: None   Collection Time    04/19/14 11:50 AM      Result Value Ref Range Status   Streptococcus, Group A Screen (Direct) NEGATIVE  NEGATIVE Final   Comment: (NOTE)     A Rapid Antigen test may result negative if the antigen level in the     sample  is below the detection level of this test. The FDA has not     cleared this test as a stand-alone test therefore the rapid antigen     negative result has reflexed to a Group A Strep culture.  CULTURE, GROUP A STREP     Status: None   Collection Time    04/19/14 11:50 AM      Result Value Ref Range Status   Specimen Description THROAT   Final   Special Requests NONE   Final   Culture     Final   Value: NO SUSPICIOUS COLONIES, CONTINUING TO HOLD     Performed at [facility]   Report Status PENDING   Incomplete  URINE CULTURE     Status: None   Collection Time    04/19/14  1:00 PM      Result Value Ref Range Status   Specimen Description URINE, CLEAN CATCH   Final   Special Requests NONE   Final   Culture  Setup Time     Final   Value: 04/20/2014 01:48      Performed at [place]     Final   Value: NO GROWTH     Performed at [facility]   Culture     Final   Value: NO GROWTH     Performed at [facility]   Report Status 04/21/2014 FINAL   Final     Studies: Ct Soft Tissue Neck Wo Contrast  04/20/2014   CLINICAL DATA:  Difficulty swallowing.  Elevated creatinine.  EXAM: CT NECK WITHOUT CONTRAST  TECHNIQUE: Multidetector CT imaging of the neck was performed following the standard protocol without intravenous contrast.  COMPARISON:  None.  FINDINGS: Examination is limited by lack of IV contrast.  Visualized portion of the brain is grossly unremarkable. Prior right cataract surgery is noted. Visualized paranasal sinuses and mastoid air cells are clear.  Bilateral tonsilliths are noted. Oropharynx, oral cavity, and nasopharynx are otherwise grossly unremarkable without gross mass identified. The larynx is unremarkable. Thyroid and submandibular and parotid glands are normal in appearance.  No enlarged cervical lymph nodes are identified. Moderate right and mild left carotid bifurcation calcification is noted. Visualized lung apices are grossly clear. Advanced multilevel degenerative disc disease is seen, greatest at C4-5 where there is likely severe spinal stenosis.  IMPRESSION: 1. No cervical mass or enlarged lymph nodes identified. 2. Advanced multilevel degenerative disc disease of the cervical spine.   Electronically Signed   By: Logan Bores   On: 04/20/2014 18:00   US Renal  04/20/2014   CLINICAL DATA:  Acute renal failure.  EXAM: RENAL/URINARY TRACT ULTRASOUND COMPLETE  COMPARISON:  CT abdomen and pelvis 09/18/2013 and renal ultrasound 10/27/2007  FINDINGS: Right Kidney:  Length: 10.5 cm. 9 mm stone was again seen in the lower pole. No hydronephrosis or solid mass identified.  Left Kidney:  Length: 10.2 cm. Echogenicity within normal limits. No mass or hydronephrosis visualized.  A small amount of perinephric  fluid was questioned bilaterally and may correspond to the stranding seen on prior CT.  Bladder:  Not identified.  Foley catheter in place.  IMPRESSION: 9 mm right lower pole stone.  No hydronephrosis.   Electronically Signed   By: Logan Bores   On: 04/20/2014 17:13    Scheduled Meds: . carvedilol  6.25 mg Oral BID WC  . enoxaparin (LOVENOX) injection  30 mg Subcutaneous Q24H  . furosemide  40  mg Intravenous BID  . gabapentin  600 mg Oral QHS  . gemfibrozil  600 mg Oral BID AC  . insulin aspart  0-5 Units Subcutaneous QHS  . insulin aspart  0-9 Units Subcutaneous TID WC  . insulin detemir  24 Units Subcutaneous QHS  . magic mouthwash  5 mL Oral QID   And  . lidocaine  5 mL Mouth/Throat QID   Continuous Infusions: . sodium chloride 135 mL/hr at 04/21/14 1022    Principal Problem:   Acute renal failure Active Problems:   Renal insufficiency   Hypertension   Hyperlipidemia   Hyperkalemia   Dehydration   Sore throat and laryngitis   Thrombocytopenia   Tachycardia   Diabetes    Time spent: 35 minutes. Greater than 50% of this time was spent in direct contact with the patient coordinating care.    [place] [facility] Pager [phone]  If 7PM-7AM, please contact night-coverage at [email or web address], password [facility] 04/21/2014, 11:45 AM  LOS: 2 days

## 2014-04-22 DIAGNOSIS — J0510 Acute epiglottitis without obstruction: Secondary | ICD-10-CM

## 2014-04-22 DIAGNOSIS — E119 Type 2 diabetes mellitus without complications: Secondary | ICD-10-CM

## 2014-04-22 DIAGNOSIS — E860 Dehydration: Secondary | ICD-10-CM

## 2014-04-22 LAB — BASIC METABOLIC PANEL
BUN: 54 mg/dL — ABNORMAL HIGH (ref 6–23)
CHLORIDE: 102 meq/L (ref 96–112)
CO2: 25 mEq/L (ref 19–32)
CREATININE: 3.22 mg/dL — AB (ref 0.50–1.35)
Calcium: 7.9 mg/dL — ABNORMAL LOW (ref 8.4–10.5)
GFR, EST AFRICAN AMERICAN: 19 mL/min — AB (ref >90)
GFR, EST NON AFRICAN AMERICAN: 17 mL/min — AB (ref >90)
GLUCOSE: 116 mg/dL — AB (ref 70–99)
POTASSIUM: 3.9 meq/L (ref 3.7–5.3)
Sodium: 141 mEq/L (ref 137–147)

## 2014-04-22 LAB — GLUCOSE, CAPILLARY
GLUCOSE-CAPILLARY: 155 mg/dL — AB (ref 70–99)
Glucose-Capillary: 97 mg/dL (ref 70–99)
Glucose-Capillary: 156 mg/dL — ABNORMAL HIGH (ref 70–99)
Glucose-Capillary: 130 mg/dL — ABNORMAL HIGH (ref 70–99)

## 2014-04-22 LAB — CBC
HEMATOCRIT: 26.5 % — AB (ref 39.0–52.0)
HEMOGLOBIN: 9.3 g/dL — AB (ref 13.0–17.0)
MCH: 31.7 pg (ref 26.0–34.0)
MCHC: 35.1 g/dL (ref 30.0–36.0)
MCV: 90.4 fL (ref 78.0–100.0)
Platelets: 110 10*3/uL — ABNORMAL LOW (ref 150–400)
RBC: 2.93 MIL/uL — ABNORMAL LOW (ref 4.22–5.81)
RDW: 13 % (ref 11.5–15.5)
WBC: 6.2 10*3/uL (ref 4.0–10.5)

## 2014-04-22 LAB — IRON AND TIBC
IRON: 46 ug/dL (ref 42–135)
SATURATION RATIOS: 21 % (ref 20–55)
TIBC: 224 ug/dL (ref 215–435)
UIBC: 178 ug/dL (ref 125–400)

## 2014-04-22 LAB — FERRITIN: Ferritin: 642 ng/mL — ABNORMAL HIGH (ref 22–322)

## 2014-04-22 MED ORDER — AMLODIPINE BESYLATE 5 MG PO TABS
5.0000 mg | ORAL_TABLET | Freq: Every day | ORAL | Status: DC
  Administered 2014-04-22 – 2014-04-23 (×2): 5 mg via ORAL
  Filled 2014-04-22 (×2): qty 1

## 2014-04-22 MED ORDER — CARVEDILOL 12.5 MG PO TABS
12.5000 mg | ORAL_TABLET | Freq: Two times a day (BID) | ORAL | Status: DC
  Administered 2014-04-22 – 2014-04-23 (×2): 12.5 mg via ORAL
  Filled 2014-04-22 (×2): qty 1

## 2014-04-22 NOTE — Progress Notes (Signed)
Subjective: Interval History: Patient feels better and does not have any nausea or vomiting  Objective: Vital signs in last 24 hours: Temp:  [97.6 F (36.4 C)-98.8 F (37.1 C)] 97.6 F (36.4 C) (04/26 0736) Pulse Rate:  [68-79] 68 (04/26 0736) Resp:  [18-20] 18 (04/26 0736) BP: (123-155)/(58-69) 155/69 mmHg (04/26 0736) SpO2:  [93 %-98 %] 98 % (04/26 0736) Weight change:   Intake/Output from previous day: 04/25 0701 - 04/26 0700 In: 3070.5 [P.O.:420; I.V.:2650.5] Out: 2900 [Urine:2900] Intake/Output this shift: Total I/O In: -  Out: 550 [Urine:550]  General appearance: alert, cooperative and no distress Resp: clear to auscultation bilaterally Cardio: regular rate and rhythm, S1, S2 normal, no murmur, click, rub or gallop GI: soft, non-tender; bowel sounds normal; no masses,  no organomegaly Extremities: extremities normal, atraumatic, no cyanosis or edema  Lab Results:  Recent Labs  04/21/14 0616 04/22/14 0554  WBC 8.0 6.2  HGB 9.5* 9.3*  HCT 27.7* 26.5*  PLT 110* 110*   BMET:   Recent Labs  04/21/14 0616 04/22/14 0554  NA 142 141  K 4.1 3.9  CL 104 102  CO2 26 25  GLUCOSE 112* 116*  BUN 55* 54*  CREATININE 3.49* 3.22*  CALCIUM 8.3* 7.9*   No results found for this basename: PTH,  in the last 72 hours Iron Studies: No results found for this basename: IRON, TIBC, TRANSFERRIN, FERRITIN,  in the last 72 hours  Studies/Results: Ct Soft Tissue Neck Wo Contrast  04/20/2014   CLINICAL DATA:  Difficulty swallowing.  Elevated creatinine.  EXAM: CT NECK WITHOUT CONTRAST  TECHNIQUE: Multidetector CT imaging of the neck was performed following the standard protocol without intravenous contrast.  COMPARISON:  None.  FINDINGS: Examination is limited by lack of IV contrast.  Visualized portion of the brain is grossly unremarkable. Prior right cataract surgery is noted. Visualized paranasal sinuses and mastoid air cells are clear.  Bilateral tonsilliths are noted.  Oropharynx, oral cavity, and nasopharynx are otherwise grossly unremarkable without gross mass identified. The larynx is unremarkable. Thyroid and submandibular and parotid glands are normal in appearance.  No enlarged cervical lymph nodes are identified. Moderate right and mild left carotid bifurcation calcification is noted. Visualized lung apices are grossly clear. Advanced multilevel degenerative disc disease is seen, greatest at C4-5 where there is likely severe spinal stenosis.  IMPRESSION: 1. No cervical mass or enlarged lymph nodes identified. 2. Advanced multilevel degenerative disc disease of the cervical spine.   Electronically Signed   By: Logan Bores   On: 04/20/2014 18:00   US Renal  04/20/2014   CLINICAL DATA:  Acute renal failure.  EXAM: RENAL/URINARY TRACT ULTRASOUND COMPLETE  COMPARISON:  CT abdomen and pelvis 09/18/2013 and renal ultrasound 10/27/2007  FINDINGS: Right Kidney:  Length: 10.5 cm. 9 mm stone was again seen in the lower pole. No hydronephrosis or solid mass identified.  Left Kidney:  Length: 10.2 cm. Echogenicity within normal limits. No mass or hydronephrosis visualized.  A small amount of perinephric fluid was questioned bilaterally and may correspond to the stranding seen on prior CT.  Bladder:  Not identified.  Foley catheter in place.  IMPRESSION: 9 mm right lower pole stone.  No hydronephrosis.   Electronically Signed   By: Logan Bores   On: 04/20/2014 17:13    I have reviewed the patient's current medications.  Assessment/Plan: Problem #1 acute kidney injury: His BUN and creatinine has started improving. This could be secondary to prerenal syndrome/ATN/interstitial nephritis. Presently patient is  a symptomatic. Patient also none oliguric. Problem #2 chronic kidney disease: Possibly stage IIIa. 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 Problem #7 low CO2 presently has corrected. Problem #8 sore throat and laryngitis: Presently getting better and patient seems to be tolerate drinking. Problem #9 anemia: His hemoglobin and hematocrit is declining possibly from hydration.  Plan:We'll continue with present tre4atment We'll check his basic metabolic panel and CBC in the morning.   LOS: 3 days   Ferne Ellingwood S Octavion Mollenkopf 04/22/2014,9:52 AM

## 2014-04-22 NOTE — Plan of Care (Signed)
Problem: Phase II Progression Outcomes Goal: Progress activity as tolerated unless otherwise ordered Outcome: Completed/Met Date Met:  04/22/14 Pt ambulated in hallway with rolling walker. Tolerated well. Ambulated approximately 200 feet. Goal: Discharge plan established Outcome: Completed/Met Date Met:  04/22/14 Discharge home with wife/HH PT. Goal: Vital signs remain stable Outcome: Progressing Pt's BP elevated at this time. MD aware. New medications/medication adjustments in place. Will continue to monitor.

## 2014-04-22 NOTE — Progress Notes (Signed)
Pt's BP elevated (see doc flowsheets). Dr. Wyline Copas paged and made aware via text page. Awaiting return page at this time.

## 2014-04-22 NOTE — Progress Notes (Signed)
[name] PROGRESS NOTE  Douglas [name] NWG:[id number] DOB: Aug 25, 1931 DOA: 04/19/2014 PCP: [name]., MD  Assessment/Plan: Pharyngitis/Laryngitis/Oral Thrush -Improving. -Cont magic mouthwash. -CT neck without abnormalities.  Hyperkalemia -Resolved.  ARF on CKD at least stage III -Cr now showing signs of improving -No uremic signs. -?Acute interstitial Nephritis given recent treatment with amoxicillin. -Appreciate renal following with recommendations. -Cont to follow renal fx closely  DM -Fair control. -Continue current regimen.  HTN -Well controlled.  Code Status: Full Code Family Communication: Pt and extended family in room  Disposition Plan: Home when ready with home health PT   Consultants:  Renal   Antibiotics:  None   Subjective: Reports feeling better. No complaints.  Objective: Filed Vitals:   04/21/14 0523 04/21/14 1326 04/21/14 2145 04/22/14 0736  BP: 139/57 123/58 138/60 155/69  Pulse: 76 70 79 68  Temp: 98.9 F (37.2 C) 98.5 F (36.9 C) 98.8 F (37.1 C) 97.6 F (36.4 C)  TempSrc: Oral Oral Oral Oral  Resp: 20 20 20 18   Height:      Weight:      SpO2: 98% 93% 95% 98%    Intake/Output Summary (Last 24 hours) at 04/22/14 1112 Last data filed at 04/22/14 0912  Gross per 24 hour  Intake 2950.5 ml  Output   3450 ml  Net -499.5 ml   Filed Weights   04/19/14 1026  Weight: 65.772 kg (145 lb)    Exam:   General:  AA Ox3, NAD  Cardiovascular: RRR  Respiratory: CTA B  Abdomen: S/NT/ND/+BS  Extremities: no C/C/E   Neurologic:  Non-focal  Data Reviewed: Basic Metabolic Panel:  Recent Labs Lab 04/19/14 2153 04/20/14 0805 04/20/14 1416 04/21/14 0616 04/22/14 0554  NA 136* 135* 140 142 141  K 6.3* 6.6* 5.6* 4.1 3.9  CL 104 105 109 104 102  CO2 18* 16* 17* 26 25  GLUCOSE 149* 224* 232* 112* 116*  BUN 50* 51* 53* 55* 54*  CREATININE 2.81* 2.87* 3.12* 3.49* 3.22*  CALCIUM 8.4 8.0* 8.0* 8.3* 7.9*    PHOS  --   --   --  4.3  --    Liver Function Tests: No results found for this basename: AST, ALT, ALKPHOS, BILITOT, PROT, ALBUMIN,  in the last 168 hours No results found for this basename: LIPASE, AMYLASE,  in the last 168 hours No results found for this basename: AMMONIA,  in the last 168 hours CBC:  Recent Labs Lab 04/19/14 1139 04/20/14 0805 04/21/14 0616 04/22/14 0554  WBC 8.2 9.5 8.0 6.2  NEUTROABS 5.2  --   --   --   HGB 13.0 11.0* 9.5* 9.3*  HCT 37.8* 32.9* 27.7* 26.5*  MCV 90.0 92.7 90.2 90.4  PLT 127* 106* 110* 110*   Cardiac Enzymes:  Recent Labs Lab 04/20/14 1636  CKTOTAL 197   BNP (last 3 results) No results found for this basename: PROBNP,  in the last 8760 hours CBG:  Recent Labs Lab 04/21/14 0809 04/21/14 1156 04/21/14 1655 04/21/14 2130 04/22/14 0749  GLUCAP 84 147* 189* 139* 97    Recent Results (from the past 240 hour(s))  RAPID STREP SCREEN     Status: None   Collection Time    04/19/14 11:50 AM      Result Value Ref Range Status   Streptococcus, Group A Screen (Direct) NEGATIVE  NEGATIVE Final   Comment: (NOTE)     A Rapid Antigen test may result negative if the antigen level in the  sample is below the detection level of this test. The FDA has not     cleared this test as a stand-alone test therefore the rapid antigen     negative result has reflexed to a Group A Strep culture.  CULTURE, GROUP A STREP     Status: None   Collection Time    04/19/14 11:50 AM      Result Value Ref Range Status   Specimen Description THROAT   Final   Special Requests NONE   Final   Culture     Final   Value: No Beta Hemolytic Streptococci Isolated     Performed at [facility]   Report Status 04/21/2014 FINAL   Final  URINE CULTURE     Status: None   Collection Time    04/19/14  1:00 PM      Result Value Ref Range Status   Specimen Description URINE, CLEAN CATCH   Final   Special Requests NONE   Final   Culture  Setup Time     Final    Value: 04/20/2014 01:48     Performed at [facility]     Final   Value: NO GROWTH     Performed at [facility]   Culture     Final   Value: NO GROWTH     Performed at [facility]   Report Status 04/21/2014 FINAL   Final     Studies: Ct Soft Tissue Neck Wo Contrast  04/20/2014   CLINICAL DATA:  Difficulty swallowing.  Elevated creatinine.  EXAM: CT NECK WITHOUT CONTRAST  TECHNIQUE: Multidetector CT imaging of the neck was performed following the standard protocol without intravenous contrast.  COMPARISON:  None.  FINDINGS: Examination is limited by lack of IV contrast.  Visualized portion of the brain is grossly unremarkable. Prior right cataract surgery is noted. Visualized paranasal sinuses and mastoid air cells are clear.  Bilateral tonsilliths are noted. Oropharynx, oral cavity, and nasopharynx are otherwise grossly unremarkable without gross mass identified. The larynx is unremarkable. Thyroid and submandibular and parotid glands are normal in appearance.  No enlarged cervical lymph nodes are identified. Moderate right and mild left carotid bifurcation calcification is noted. Visualized lung apices are grossly clear. Advanced multilevel degenerative disc disease is seen, greatest at C4-5 where there is likely severe spinal stenosis.  IMPRESSION: 1. No cervical mass or enlarged lymph nodes identified. 2. Advanced multilevel degenerative disc disease of the cervical spine.   Electronically Signed   By: Logan Bores   On: 04/20/2014 18:00   US Renal  04/20/2014   CLINICAL DATA:  Acute renal failure.  EXAM: RENAL/URINARY TRACT ULTRASOUND COMPLETE  COMPARISON:  CT abdomen and pelvis 09/18/2013 and renal ultrasound 10/27/2007  FINDINGS: Right Kidney:  Length: 10.5 cm. 9 mm stone was again seen in the lower pole. No hydronephrosis or solid mass identified.  Left Kidney:  Length: 10.2 cm. Echogenicity within normal limits. No mass or hydronephrosis visualized.   A small amount of perinephric fluid was questioned bilaterally and may correspond to the stranding seen on prior CT.  Bladder:  Not identified.  Foley catheter in place.  IMPRESSION: 9 mm right lower pole stone.  No hydronephrosis.   Electronically Signed   By: Logan Bores   On: 04/20/2014 17:13    Scheduled Meds: . carvedilol  6.25 mg Oral BID WC  . enoxaparin (LOVENOX) injection  30 mg Subcutaneous Q24H  . gabapentin  600 mg Oral QHS  . gemfibrozil  600 mg Oral BID AC  . insulin aspart  0-5 Units Subcutaneous QHS  . insulin aspart  0-9 Units Subcutaneous TID WC  . insulin detemir  24 Units Subcutaneous QHS  . magic mouthwash  5 mL Oral QID   And  . lidocaine  5 mL Mouth/Throat QID   Continuous Infusions: . sodium chloride 135 mL/hr at 04/22/14 0093    Principal Problem:   Acute renal failure Active Problems:   Renal insufficiency   Hypertension   Hyperlipidemia   Hyperkalemia   Dehydration   Sore throat and laryngitis   Thrombocytopenia   Tachycardia   Diabetes  Time spent: 35 minutes. Greater than 50% of this time was spent in direct contact with the patient coordinating care.  [facility] Pager [phone]  If 7PM-7AM, please contact night-coverage at [email or web address], password [facility] 04/22/2014, 11:12 AM  LOS: 3 days

## 2014-04-23 LAB — VITAMIN D 25 HYDROXY (VIT D DEFICIENCY, FRACTURES): VIT D 25 HYDROXY: 11 ng/mL — AB (ref 30–89)

## 2014-04-23 LAB — GLUCOSE, CAPILLARY
GLUCOSE-CAPILLARY: 136 mg/dL — AB (ref 70–99)
Glucose-Capillary: 89 mg/dL (ref 70–99)
Glucose-Capillary: 184 mg/dL — ABNORMAL HIGH (ref 70–99)

## 2014-04-23 LAB — PTH, INTACT AND CALCIUM
Calcium, Total (PTH): 7.9 mg/dL — ABNORMAL LOW (ref 8.4–10.5)
PTH: 131.8 pg/mL — ABNORMAL HIGH (ref 14.0–72.0)

## 2014-04-23 LAB — BASIC METABOLIC PANEL
BUN: 44 mg/dL — ABNORMAL HIGH (ref 6–23)
CHLORIDE: 106 meq/L (ref 96–112)
CO2: 22 mEq/L (ref 19–32)
Calcium: 7.8 mg/dL — ABNORMAL LOW (ref 8.4–10.5)
Creatinine, Ser: 2.59 mg/dL — ABNORMAL HIGH (ref 0.50–1.35)
GFR calc non Af Amer: 22 mL/min — ABNORMAL LOW (ref >90)
GFR, EST AFRICAN AMERICAN: 25 mL/min — AB (ref >90)
Glucose, Bld: 88 mg/dL (ref 70–99)
POTASSIUM: 4.1 meq/L (ref 3.7–5.3)
SODIUM: 143 meq/L (ref 137–147)

## 2014-04-23 LAB — CBC
HEMATOCRIT: 30.1 % — AB (ref 39.0–52.0)
HEMOGLOBIN: 10.2 g/dL — AB (ref 13.0–17.0)
MCH: 30.6 pg (ref 26.0–34.0)
MCHC: 33.9 g/dL (ref 30.0–36.0)
MCV: 90.4 fL (ref 78.0–100.0)
Platelets: 155 10*3/uL (ref 150–400)
RBC: 3.33 MIL/uL — ABNORMAL LOW (ref 4.22–5.81)
RDW: 12.9 % (ref 11.5–15.5)
WBC: 8.1 10*3/uL (ref 4.0–10.5)

## 2014-04-23 MED ORDER — LOSARTAN POTASSIUM 50 MG PO TABS: 25.0000 mg | ORAL_TABLET | Freq: Every day | ORAL | Status: DC

## 2014-04-23 MED ORDER — AMLODIPINE BESYLATE 5 MG PO TABS: 10.0000 mg | ORAL_TABLET | Freq: Every day | ORAL | Status: DC

## 2014-04-23 MED ORDER — AMLODIPINE BESYLATE 10 MG PO TABS: 10.0000 mg | ORAL_TABLET | Freq: Every day | ORAL | Status: DC

## 2014-04-23 MED ORDER — CARVEDILOL 12.5 MG PO TABS: 12.5000 mg | ORAL_TABLET | Freq: Two times a day (BID) | ORAL | Status: DC

## 2014-04-23 MED ORDER — MAGIC MOUTHWASH: 15.0000 mL | Freq: Four times a day (QID) | ORAL | Status: DC

## 2014-04-23 NOTE — Progress Notes (Signed)
States his blood sugar felt low. Results 89. States he is symptomatic that low. Gave him orange juice to drink.

## 2014-04-23 NOTE — Progress Notes (Signed)
IV removed. Discharge instructions reviewed with patient, wife and daughter. Understanding verbalized.

## 2014-04-23 NOTE — Discharge Summary (Signed)
Physician Discharge Summary  Douglas [name] KDT:[id number] DOB: 1931-09-04 DOA: 04/19/2014  PCP: [name]., MD  Admit date: 04/19/2014 Discharge date: 04/23/2014  Time spent: 40 minutes  Recommendations for Outpatient Follow-up:  1. Has appointment with PCP 04/25/14. Recommend BMET to evaluate kidney function. Recommend close monitoring of BP as medications adjusted due to worsening kidney function  Discharge Diagnoses:  Principal Problem:   Acute renal failure Active Problems:   Renal insufficiency   Hypertension   Hyperlipidemia   Hyperkalemia   Dehydration   Sore throat and laryngitis   Thrombocytopenia   Tachycardia   Diabetes   Discharge Condition: stable  Diet recommendation: heart healthy carb modified  Filed Weights   04/19/14 1026  Weight: 65.772 kg (145 lb)    History of present illness:  Douglas [name] is a 80 y.o. male with past medical history that includes prostate cancer, hypertension, diabetes, presented to the emergency department on 04/19/14 with chief complaint of sore throat and generalized weakness. Reported severe ear ache started 4 days prior. He saw his family doctor and was prescribed amoxicillin. Reported ear pain improved and he developed a sore throat during the previous 4 days. TOn the morning of admission pain had worsened to the point he was unable to swallow and he was losing his voice. Associated symptoms included nausea with one episode of emesis. Denied coffee ground emesis. In addition he experienced weakness, chills with subjective fever, decreased by mouth intake and moist productive cough. Denied chest pain palpitations shortness of breath dizziness syncope or near-syncope. Denied abdominal pain dysuria hematuria frequency or urgency. Initial workup in the emergency room revealed acute renal failure, mild hyperkalemia likely related to dehydration. Urinalysis with few squamas cell, many bacteria 3-6 WBCs. Rapid strep and mono test  negative. He was hemodynamically stable and not hypoxic and afebrile.   Hospital Course:   Pharyngitis/Laryngitis/Oral Thrush   -Likely viral URI. admitted to medical floor. Provided with magic mouth wash. CT neck without abnormalities. At discharge resolved. Eating without problem. CT neck without abnormalities.  Will continue magic mouthwash on DC to help with thrush.  Hyperkalemia  -Resolved.   ARF on CKD at least stage III  -Improving at discharge. No uremic signs.  ? Acute interstitial Nephritis given recent treatment with amoxicillin. Renal US without obstruction.  Evaluated by Dr. Lowanda Foster with renal.  Iv fluids were increased to 135/hr and lasix 40mg  BID provided.  Home ARB held. At discharge creatinine trending down. Baseline 1.4. Patient has appointment with PCP 2 days. Recommend BMET for tracking of kidney function.  Cr is 2.5 on Dc from a high of 3.8 during this admission.  DM  -Fair control. CBG 89-136.    HTN  - Only fair control. Home ARB on hold secondary to #3. Started on coreg and norvasc. Will continue these at discharge. Recommend close OP follow up and monitoring of BP.    Procedures:  none  Consultations:  Dr. Hinda Lenis  Discharge Exam: Filed Vitals:   04/23/14 0853  BP: 146/67  Pulse: 79  Temp:   Resp:     General: well nourished NAD Cardiovascular: RRR No MGR No LE edema Respiratory: normal effort BS clear bilaterally no wheeze  Discharge Instructions You were cared for by a hospitalist during your hospital stay. If you have any questions about your discharge medications or the care you received while you were in the hospital after you are discharged, you can call the unit and asked to speak with the hospitalist  on call if the hospitalist that took care of you is not available. Once you are discharged, your primary care physician will handle any further medical issues. Please note that NO REFILLS for any discharge medications will be authorized once  you are discharged, as it is imperative that you return to your primary care physician (or establish a relationship with a primary care physician if you do not have one) for your aftercare needs so that they can reassess your need for medications and monitor your lab values.      Discharge Orders   Future Appointments Provider Department Dept Phone   06/06/2014 2:00 PM Hayden Pedro, MD [place] [phone]   Future Orders Complete By Expires   Diet - low sodium heart healthy  As directed    Discharge instructions  As directed    Increase activity slowly  As directed        Medication List    STOP taking these medications       amoxicillin 500 MG capsule  Commonly known as:  AMOXIL     losartan 25 MG tablet  Commonly known as:  COZAAR      TAKE these medications       amLODipine 10 MG tablet  Commonly known as:  NORVASC  Take 1 tablet (10 mg total) by mouth daily.  Start taking on:  04/24/2014     carvedilol 12.5 MG tablet  Commonly known as:  COREG  Take 1 tablet (12.5 mg total) by mouth 2 (two) times daily with a meal.     gabapentin 300 MG capsule  Commonly known as:  NEURONTIN  Take 600 mg by mouth at bedtime.     gemfibrozil 600 MG tablet  Commonly known as:  LOPID  Take 600 mg by mouth 2 (two) times daily before a meal.     insulin detemir 100 UNIT/ML injection  Commonly known as:  LEVEMIR  Inject 34 Units into the skin at bedtime.     insulin lispro protamine-lispro (75-25) 100 UNIT/ML Susp injection  Commonly known as:  HUMALOG 75/25 MIX  Inject 12-16 Units into the skin 3 (three) times daily.     magic mouthwash Soln  Take 15 mLs by mouth 4 (four) times daily.       No Known Allergies Follow-up Information   Follow up with [facility] S, MD In 4 weeks.   Specialty:  Nephrology   Contact information:   [address] [address] [phone]       Follow up with [name],[name], MD On  04/25/2014. (has appointment 04/25/14. )    Specialty:  Internal Medicine   Contact information:   [place] [address] [phone]        The results of significant diagnostics from this hospitalization (including imaging, microbiology, ancillary and laboratory) are listed below for reference.    Significant Diagnostic Studies: Ct Soft Tissue Neck Wo Contrast  04/20/2014   CLINICAL DATA:  Difficulty swallowing.  Elevated creatinine.  EXAM: CT NECK WITHOUT CONTRAST  TECHNIQUE: Multidetector CT imaging of the neck was performed following the standard protocol without intravenous contrast.  COMPARISON:  None.  FINDINGS: Examination is limited by lack of IV contrast.  Visualized portion of the brain is grossly unremarkable. Prior right cataract surgery is noted. Visualized paranasal sinuses and mastoid air cells are clear.  Bilateral tonsilliths are noted. Oropharynx, oral cavity, and nasopharynx are otherwise grossly unremarkable without gross  mass identified. The larynx is unremarkable. Thyroid and submandibular and parotid glands are normal in appearance.  No enlarged cervical lymph nodes are identified. Moderate right and mild left carotid bifurcation calcification is noted. Visualized lung apices are grossly clear. Advanced multilevel degenerative disc disease is seen, greatest at C4-5 where there is likely severe spinal stenosis.  IMPRESSION: 1. No cervical mass or enlarged lymph nodes identified. 2. Advanced multilevel degenerative disc disease of the cervical spine.   Electronically Signed   By: Logan Bores   On: 04/20/2014 18:00   US Renal  04/20/2014   CLINICAL DATA:  Acute renal failure.  EXAM: RENAL/URINARY TRACT ULTRASOUND COMPLETE  COMPARISON:  CT abdomen and pelvis 09/18/2013 and renal ultrasound 10/27/2007  FINDINGS: Right Kidney:  Length: 10.5 cm. 9 mm stone was again seen in the lower pole. No hydronephrosis or solid mass identified.  Left Kidney:  Length: 10.2 cm.  Echogenicity within normal limits. No mass or hydronephrosis visualized.  A small amount of perinephric fluid was questioned bilaterally and may correspond to the stranding seen on prior CT.  Bladder:  Not identified.  Foley catheter in place.  IMPRESSION: 9 mm right lower pole stone.  No hydronephrosis.   Electronically Signed   By: Logan Bores   On: 04/20/2014 17:13    Microbiology: Recent Results (from the past 240 hour(s))  RAPID STREP SCREEN     Status: None   Collection Time    04/19/14 11:50 AM      Result Value Ref Range Status   Streptococcus, Group A Screen (Direct) NEGATIVE  NEGATIVE Final   Comment: (NOTE)     A Rapid Antigen test may result negative if the antigen level in the     sample is below the detection level of this test. The FDA has not     cleared this test as a stand-alone test therefore the rapid antigen     negative result has reflexed to a Group A Strep culture.  CULTURE, GROUP A STREP     Status: None   Collection Time    04/19/14 11:50 AM      Result Value Ref Range Status   Specimen Description THROAT   Final   Special Requests NONE   Final   Culture     Final   Value: No Beta Hemolytic Streptococci Isolated     Performed at [facility]   Report Status 04/21/2014 FINAL   Final  URINE CULTURE     Status: None   Collection Time    04/19/14  1:00 PM      Result Value Ref Range Status   Specimen Description URINE, CLEAN CATCH   Final   Special Requests NONE   Final   Culture  Setup Time     Final   Value: 04/20/2014 01:48     Performed at [facility]     Final   Value: NO GROWTH     Performed at [facility]   Culture     Final   Value: NO GROWTH     Performed at [facility]   Report Status 04/21/2014 FINAL   Final     Labs: Basic Metabolic Panel:  Recent Labs Lab 04/20/14 0805 04/20/14 1416 04/21/14 0616 04/22/14 0554 04/23/14 0641  NA 135* 140 142 141 143  K 6.6* 5.6* 4.1 3.9 4.1  CL  105 109 104 102 106  CO2 16* 17* 26 25 22  GLUCOSE 224* 232* 112* 116* 88  BUN 51* 53* 55* 54* 44*  CREATININE 2.87* 3.12* 3.49* 3.22* 2.59*  CALCIUM 8.0* 8.0* 8.3*  7.9* 7.9* 7.8*  PHOS  --   --  4.3  --   --    Liver Function Tests: No results found for this basename: AST, ALT, ALKPHOS, BILITOT, PROT, ALBUMIN,  in the last 168 hours No results found for this basename: LIPASE, AMYLASE,  in the last 168 hours No results found for this basename: AMMONIA,  in the last 168 hours CBC:  Recent Labs Lab 04/19/14 1139 04/20/14 0805 04/21/14 0616 04/22/14 0554 04/23/14 0641  WBC 8.2 9.5 8.0 6.2 8.1  NEUTROABS 5.2  --   --   --   --   HGB 13.0 11.0* 9.5* 9.3* 10.2*  HCT 37.8* 32.9* 27.7* 26.5* 30.1*  MCV 90.0 92.7 90.2 90.4 90.4  PLT 127* 106* 110* 110* 155   Cardiac Enzymes:  Recent Labs Lab 04/20/14 1636  CKTOTAL 197   BNP: BNP (last 3 results) No results found for this basename: PROBNP,  in the last 8760 hours CBG:  Recent Labs Lab 04/22/14 1652 04/22/14 2113 04/23/14 0635 04/23/14 0748 04/23/14 1143  GLUCAP 130* 155* 89 136* 184*       Signed:  Lezlie Octave Black  [name] 04/23/2014, 2:01 PM  Patient seen and examined. Agree with above note by Dyanne Carrel, NP. Patient will be discharged home today. Has follow up in 2 days with PCP to follow renal function.  Domingo Mend, MD [name] Pager: [phone]

## 2014-04-23 NOTE — Progress Notes (Signed)
[name] PROGRESS NOTE  Douglas KUBITZ SEG:[id number] DOB: [birth year] DOA: 04/19/2014 PCP: [name]., MD   Summary: 80 year old admitted with acute renal failure, hyperkalemia related to dehydration due to acute URI.   Assessment/Plan: Pharyngitis/Laryngitis/Oral Thrush  Much improved. Eating without problem this am.  Continue Magic mouthwash.  -CT neck without abnormalities.  Hyperkalemia  -Resolved.  ARF on CKD at least stage III  -Improved this am. No uremic signs. Acute interstitial Nephritis given recent treatment with amoxicillin. Appreciate renal following with recommendations. Iv fluids per nephrology. Home ARB on hold. Recheck renal function in am.  DM  -Fair control. CBG 89-136.  -Continue current regimen.  HTN  - Only fair control. Had prn hydralazine during night. Home ARB on hold secondary to #3. Will increase norvasc. monior    Code Status: full Family Communication:  (indicate person spoken with, relationship, and if by phone, the number) Disposition Plan: home hopefully in am    nephrology  Procedures:  none  Antibiotics:  none  HPI/Subjective: Sitting up eating breakfast  Objective: Filed Vitals:   04/23/14 0853  BP: 146/67  Pulse: 79  Temp:   Resp:     Intake/Output Summary (Last 24 hours) at 04/23/14 1009 Last data filed at 04/23/14 0800  Gross per 24 hour  Intake 1930.5 ml  Output   3550 ml  Net -1619.5 ml   Filed Weights   04/19/14 1026  Weight: 65.772 kg (145 lb)    Exam:   General:  Alert well nourished NAD  Cardiovascular: RRR No MGR No LE edema  Respiratory: normal effort BS clear bilaterally no wheeze  Abdomen: round soft +BS non-tender to palpation  Musculoskeletal: no clubbing no cyanosis   Data Reviewed: Basic Metabolic Panel:  Recent Labs Lab 04/20/14 0805 04/20/14 1416 04/21/14 0616 04/22/14 0554 04/23/14 0641  NA 135* 140 142 141 143  K 6.6* 5.6* 4.1 3.9 4.1  CL 105 109 104 102 106   CO2 16* 17* 26 25 22   GLUCOSE 224* 232* 112* 116* 88  BUN 51* 53* 55* 54* 44*  CREATININE 2.87* 3.12* 3.49* 3.22* 2.59*  CALCIUM 8.0* 8.0* 8.3* 7.9* 7.8*  PHOS  --   --  4.3  --   --    Liver Function Tests: No results found for this basename: AST, ALT, ALKPHOS, BILITOT, PROT, ALBUMIN,  in the last 168 hours No results found for this basename: LIPASE, AMYLASE,  in the last 168 hours No results found for this basename: AMMONIA,  in the last 168 hours CBC:  Recent Labs Lab 04/19/14 1139 04/20/14 0805 04/21/14 0616 04/22/14 0554 04/23/14 0641  WBC 8.2 9.5 8.0 6.2 8.1  NEUTROABS 5.2  --   --   --   --   HGB 13.0 11.0* 9.5* 9.3* 10.2*  HCT 37.8* 32.9* 27.7* 26.5* 30.1*  MCV 90.0 92.7 90.2 90.4 90.4  PLT 127* 106* 110* 110* 155   Cardiac Enzymes:  Recent Labs Lab 04/20/14 1636  CKTOTAL 197   BNP (last 3 results) No results found for this basename: PROBNP,  in the last 8760 hours CBG:  Recent Labs Lab 04/22/14 1122 04/22/14 1652 04/22/14 2113 04/23/14 0635 04/23/14 0748  GLUCAP 156* 130* 155* 89 136*    Recent Results (from the past 240 hour(s))  RAPID STREP SCREEN     Status: None   Collection Time    04/19/14 11:50 AM      Result Value Ref Range Status   Streptococcus, Group  A Screen (Direct) NEGATIVE  NEGATIVE Final   Comment: (NOTE)     A Rapid Antigen test may result negative if the antigen level in the     sample is below the detection level of this test. The FDA has not     cleared this test as a stand-alone test therefore the rapid antigen     negative result has reflexed to a Group A Strep culture.  CULTURE, GROUP A STREP     Status: None   Collection Time    04/19/14 11:50 AM      Result Value Ref Range Status   Specimen Description THROAT   Final   Special Requests NONE   Final   Culture     Final   Value: No Beta Hemolytic Streptococci Isolated     Performed at [facility]   Report Status 04/21/2014 FINAL   Final  URINE CULTURE      Status: None   Collection Time    04/19/14  1:00 PM      Result Value Ref Range Status   Specimen Description URINE, CLEAN CATCH   Final   Special Requests NONE   Final   Culture  Setup Time     Final   Value: 04/20/2014 01:48     Performed at [facility]     Final   Value: NO GROWTH     Performed at [facility]   Culture     Final   Value: NO GROWTH     Performed at [facility]   Report Status 04/21/2014 FINAL   Final     Studies: No results found.  Scheduled Meds: . [START ON 04/24/2014] amLODipine  10 mg Oral Daily  . carvedilol  12.5 mg Oral BID WC  . enoxaparin (LOVENOX) injection  30 mg Subcutaneous Q24H  . gabapentin  600 mg Oral QHS  . gemfibrozil  600 mg Oral BID AC  . insulin aspart  0-5 Units Subcutaneous QHS  . insulin aspart  0-9 Units Subcutaneous TID WC  . insulin detemir  24 Units Subcutaneous QHS  . magic mouthwash  5 mL Oral QID   And  . lidocaine  5 mL Mouth/Throat QID   Continuous Infusions: . sodium chloride 75 mL/hr at 04/23/14 0848    Principal Problem:   Acute renal failure Active Problems:   Renal insufficiency   Hypertension   Hyperlipidemia   Hyperkalemia   Dehydration   Sore throat and laryngitis   Thrombocytopenia   Tachycardia   Diabetes    Time spent: 35 minutes    [facility] Pager [phone]. If 7PM-7AM, please contact night-coverage at [email or web address], password [facility] 04/23/2014, 10:09 AM  LOS: 4 days             Agree with above note. Please see DC Summary for details as patient will be discharged home today.  Domingo Mend, MD [name] Pager: [phone]

## 2014-04-23 NOTE — Progress Notes (Signed)
Subjective: Interval History: No new complaint. His appetite is good  Objective: Vital signs in last 24 hours: Temp:  [98.1 F (36.7 C)-98.5 F (36.9 C)] 98.2 F (36.8 C) (04/27 0553) Pulse Rate:  [72-80] 75 (04/27 0553) Resp:  [20] 20 (04/27 0553) BP: (146-190)/(59-95) 190/77 mmHg (04/27 0553) SpO2:  [94 %-95 %] 94 % (04/27 0553) Weight change:   Intake/Output from previous day: 04/26 0701 - 04/27 0700 In: 2050.5 [P.O.:480; I.V.:1570.5] Out: 3100 [Urine:3100] Intake/Output this shift:    General appearance: alert, cooperative and no distress Resp: clear to auscultation bilaterally Cardio: regular rate and rhythm, S1, S2 normal, no murmur, click, rub or gallop GI: soft, non-tender; bowel sounds normal; no masses,  no organomegaly Extremities: extremities normal, atraumatic, no cyanosis or edema  Lab Results:  Recent Labs  04/22/14 0554 04/23/14 0641  WBC 6.2 8.1  HGB 9.3* 10.2*  HCT 26.5* 30.1*  PLT 110* 155   BMET:   Recent Labs  04/22/14 0554 04/23/14 0641  NA 141 143  K 3.9 4.1  CL 102 106  CO2 25 22  GLUCOSE 116* 88  BUN 54* 44*  CREATININE 3.22* 2.59*  CALCIUM 7.9* 7.8*   No results found for this basename: PTH,  in the last 72 hours Iron Studies:   Recent Labs  04/22/14 0554  IRON 46  TIBC 224  FERRITIN 642*    Studies/Results: No results found.  I have reviewed the patient's current medications.  Assessment/Plan: Problem #1 acute kidney injury: His BUN and creatinine continuesly improving and patient is asymptomatic. His renal function is still above his base line but improving Problem #2 chronic kidney disease: Possibly stage IIIa. 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 Problem #7 low CO2 is with in  acceptable range Problem #8 sore throat and laryngitis: Presently getting better and patient seems to be tolerate drinking. Problem #9 anemia: His hemoglobin and hematocrit is dstable  Plan: decrease ivf to 75 cc/min and can be d/c if patient is going to be discharged We will follow patient as out patient when he is discharged.   LOS: 4 days   Carman Auxier S [employer] 04/23/2014,7:44 AM

## 2014-04-24 LAB — COMPLEMENT, TOTAL

## 2014-04-25 LAB — ANTISTREPTOLYSIN O TITER

## 2014-04-25 LAB — C4 COMPLEMENT: COMPLEMENT C4, BODY FLUID: 30 mg/dL (ref 10–40)

## 2014-04-25 LAB — C3 COMPLEMENT: C3 Complement: 173 mg/dL (ref 90–180)

## 2014-06-06 ENCOUNTER — Ambulatory Visit (INDEPENDENT_AMBULATORY_CARE_PROVIDER_SITE_OTHER): Payer: Medicare Other | Admitting: Ophthalmology

## 2014-06-06 DIAGNOSIS — H35379 Puckering of macula, unspecified eye: Secondary | ICD-10-CM

## 2014-06-06 DIAGNOSIS — I10 Essential (primary) hypertension: Secondary | ICD-10-CM

## 2014-06-06 DIAGNOSIS — E1165 Type 2 diabetes mellitus with hyperglycemia: Secondary | ICD-10-CM

## 2014-06-06 DIAGNOSIS — E1139 Type 2 diabetes mellitus with other diabetic ophthalmic complication: Secondary | ICD-10-CM

## 2014-06-06 DIAGNOSIS — H43819 Vitreous degeneration, unspecified eye: Secondary | ICD-10-CM

## 2014-06-06 DIAGNOSIS — H35039 Hypertensive retinopathy, unspecified eye: Secondary | ICD-10-CM

## 2014-07-06 ENCOUNTER — Ambulatory Visit (INDEPENDENT_AMBULATORY_CARE_PROVIDER_SITE_OTHER): Payer: Medicare Other | Admitting: Urology

## 2014-07-06 DIAGNOSIS — Z87440 Personal history of urinary (tract) infections: Secondary | ICD-10-CM

## 2014-07-06 DIAGNOSIS — N393 Stress incontinence (female) (male): Secondary | ICD-10-CM

## 2014-07-06 DIAGNOSIS — C61 Malignant neoplasm of prostate: Secondary | ICD-10-CM

## 2014-08-28 HISTORY — PX: EYE SURGERY: SHX253

## 2014-10-13 ENCOUNTER — Encounter (HOSPITAL_COMMUNITY): Payer: Self-pay | Admitting: Emergency Medicine

## 2014-10-13 ENCOUNTER — Emergency Department (HOSPITAL_COMMUNITY)
Admission: EM | Admit: 2014-10-13 | Discharge: 2014-10-13 | Disposition: A | Discharge status: Home / Self Care | Payer: Medicare Other | Attending: Emergency Medicine | Admitting: Emergency Medicine

## 2014-10-13 ENCOUNTER — Emergency Department (HOSPITAL_COMMUNITY): Payer: Medicare Other

## 2014-10-13 DIAGNOSIS — S82845A Nondisplaced bimalleolar fracture of left lower leg, initial encounter for closed fracture: Secondary | ICD-10-CM | POA: Diagnosis not present

## 2014-10-13 DIAGNOSIS — Z87440 Personal history of urinary (tract) infections: Secondary | ICD-10-CM | POA: Insufficient documentation

## 2014-10-13 DIAGNOSIS — H269 Unspecified cataract: Secondary | ICD-10-CM | POA: Diagnosis not present

## 2014-10-13 DIAGNOSIS — E119 Type 2 diabetes mellitus without complications: Secondary | ICD-10-CM | POA: Insufficient documentation

## 2014-10-13 DIAGNOSIS — E785 Hyperlipidemia, unspecified: Secondary | ICD-10-CM | POA: Insufficient documentation

## 2014-10-13 DIAGNOSIS — Z87442 Personal history of urinary calculi: Secondary | ICD-10-CM | POA: Insufficient documentation

## 2014-10-13 DIAGNOSIS — Z859 Personal history of malignant neoplasm, unspecified: Secondary | ICD-10-CM | POA: Insufficient documentation

## 2014-10-13 DIAGNOSIS — Z8709 Personal history of other diseases of the respiratory system: Secondary | ICD-10-CM | POA: Insufficient documentation

## 2014-10-13 DIAGNOSIS — S82842A Displaced bimalleolar fracture of left lower leg, initial encounter for closed fracture: Secondary | ICD-10-CM

## 2014-10-13 DIAGNOSIS — W010XXA Fall on same level from slipping, tripping and stumbling without subsequent striking against object, initial encounter: Secondary | ICD-10-CM | POA: Insufficient documentation

## 2014-10-13 DIAGNOSIS — Z794 Long term (current) use of insulin: Secondary | ICD-10-CM | POA: Diagnosis not present

## 2014-10-13 DIAGNOSIS — I10 Essential (primary) hypertension: Secondary | ICD-10-CM | POA: Diagnosis not present

## 2014-10-13 DIAGNOSIS — Z79899 Other long term (current) drug therapy: Secondary | ICD-10-CM | POA: Insufficient documentation

## 2014-10-13 DIAGNOSIS — S99912A Unspecified injury of left ankle, initial encounter: Secondary | ICD-10-CM | POA: Diagnosis present

## 2014-10-13 MED ORDER — HYDROCODONE-ACETAMINOPHEN 5-325 MG PO TABS: 0.5000 | ORAL_TABLET | ORAL | Status: DC | PRN

## 2014-10-13 NOTE — ED Provider Notes (Signed)
CSN: [id number]     Arrival date & time 10/13/14  1926 History  This chart was scribed for Orpah Greek, * by Jeanell Sparrow, ED Scribe. This patient was seen in room APA03/APA03 and the patient's care was started at 7:37 PM.   Chief Complaint  Patient presents with  . Ankle Injury   The history is provided by the patient. No language interpreter was used.   HPI Comments: Douglas [name] is a 80 y.o. male who presents to the Emergency Department complaining of a left ankle injury that occurred yesterday. Pt reports that he stepped on something, twisted his left ankle, and fell yesterday. He states that he currently has associated pain and swelling in his left ankle. He denies any LOC or any other injury.   Past Medical History  Diagnosis Date  . Cancer   . Hypertension   . Diabetes mellitus   . Hyperlipidemia   . Cataracts, bilateral   . Hyperkalemia   . UTI (lower urinary tract infection)   . Calculus of kidney   . Sensorineural hearing loss, unspecified   . Epiglottitis    History reviewed. No pertinent past surgical history. No family history on file. History  Substance Use Topics  . Smoking status: Never Smoker   . Smokeless tobacco: Not on file  . Alcohol Use: No    Review of Systems  Musculoskeletal: Positive for joint swelling and myalgias.  Neurological: Negative for syncope.  All other systems reviewed and are negative.     Allergies  Review of patient's allergies indicates no known allergies.  Home Medications   Prior to Admission medications   Medication Sig Start Date End Date Taking? Authorizing Provider  ATORVASTATIN CALCIUM PO Take 1 tablet by mouth daily.   Yes Historical Provider, MD  FUROSEMIDE PO Take 1 tablet by mouth daily.   Yes Historical Provider, MD  gabapentin (NEURONTIN) 300 MG capsule Take 300 mg by mouth at bedtime.    Yes Historical Provider, MD  insulin detemir (LEVEMIR) 100 UNIT/ML injection Inject 30 Units into the skin at  bedtime.    Yes Historical Provider, MD  insulin lispro protamine-insulin lispro (HUMALOG 75/25) (75-25) 100 UNIT/ML SUSP Inject 12 Units into the skin 3 (three) times daily. Taken based on sliding scale   Yes Historical Provider, MD  LISINOPRIL PO Take 1 tablet by mouth daily.   Yes Historical Provider, MD  omeprazole (PRILOSEC) 20 MG capsule Take 20 mg by mouth daily.   Yes Historical Provider, MD  sodium bicarbonate 650 MG tablet Take 650 mg by mouth 3 (three) times daily.   Yes Historical Provider, MD   BP 162/70  Pulse 115  Temp(Src) 98.6 F (37 C)  Resp 20  Ht 5\' 4"  (1.626 m)  Wt 140 lb (63.504 kg)  BMI 24.02 kg/m2  SpO2 99% Physical Exam  Nursing note and vitals reviewed. Constitutional: He is oriented to person, place, and time. He appears well-developed and well-nourished. No distress.  HENT:  Head: Normocephalic and atraumatic.  Right Ear: Hearing normal.  Left Ear: Hearing normal.  Nose: Nose normal.  Mouth/Throat: Oropharynx is clear and moist and mucous membranes are normal.  Eyes: Conjunctivae and EOM are normal. Pupils are equal, round, and reactive to light.  Neck: Normal range of motion. Neck supple.  Cardiovascular: Regular rhythm, S1 normal and S2 normal.  Exam reveals no gallop and no friction rub.   No murmur heard. Pulmonary/Chest: Effort normal and breath sounds normal. No respiratory distress.  He exhibits no tenderness.  Abdominal: Soft. Normal appearance and bowel sounds are normal. There is no hepatosplenomegaly. There is no tenderness. There is no rebound, no guarding, no tenderness at McBurney's point and negative Murphy's sign. No hernia.  Musculoskeletal: Normal range of motion. He exhibits tenderness.  TTP of the lateral malleolus of the left ankle and left foot. Swelling present on the left ankle.   Neurological: He is alert and oriented to person, place, and time. He has normal strength. No cranial nerve deficit or sensory deficit. Coordination normal.  GCS eye subscore is 4. GCS verbal subscore is 5. GCS motor subscore is 6.  Skin: Skin is warm, dry and intact. No rash noted. No cyanosis.  Psychiatric: He has a normal mood and affect. His speech is normal and behavior is normal. Thought content normal.    ED Course  Procedures (including critical care time) DIAGNOSTIC STUDIES: Oxygen Saturation is 99% on RA, normal by my interpretation.    COORDINATION OF CARE: 7:41 PM- Pt advised of plan for treatment which includes radiology and pt agrees.  Labs Review Labs Reviewed - No data to display  Imaging Review Dg Ankle Complete Left  10/13/2014   CLINICAL DATA:  Injury 1 day ago with lateral left ankle pain and swelling.  EXAM: LEFT ANKLE COMPLETE - 3+ VIEW  COMPARISON:  None.  FINDINGS: There soft tissue swelling about the ankle. The patient has a mildly distracted medial malleolar fracture. There is also a nondisplaced fracture of the lateral malleolus. Extensive atherosclerotic vascular disease is seen.  IMPRESSION: Nondisplaced medial malleolar fracture. Incomplete and nondisplaced lateral malleolar fracture is also identified.   Electronically Signed   By: Inge Rise M.D.   On: 10/13/2014 20:27   Dg Foot Complete Left  10/13/2014   CLINICAL DATA:  Twisting injury left foot and ankle yesterday with pain and swelling. Initial encounter.  EXAM: LEFT FOOT - COMPLETE 3+ VIEW  COMPARISON:  None.  FINDINGS: Medial and lateral malleolar fractures are identified as seen on the comparison plain films of the left ankle. Also seen is a fracture of the proximal phalanx of the second toe. The fracture involves the lateral corner of the proximal phalanx at the MTP joint. Fracture fragment measures 0.4 cm in diameter. No other acute bony or joint abnormality is seen. Atherosclerosis noted.  IMPRESSION: Medial and lateral malleolar fractures.  Chip fracture off the lateral aspect of the base of the proximal phalanx of the second toe.   Electronically  Signed   By: Inge Rise M.D.   On: 10/13/2014 20:30     EKG Interpretation None      MDM   Final diagnoses:  None   ankle fracture  Patient presents to the ER for evaluation of ankle injury. Patient has had pain and swelling over both malleoli of the ankle. There is no deformity noted. X-ray shows fracture of the distal portion of the medial and lateral malleolus. Ankle mortise is intact, fractures are below the mortise. Patient will be placed in a cam walker. He does have a walker to help with ambulation at home. He is to follow up with orthopedics Monday. Return to ER for worsening symptoms.  I personally performed the services described in this documentation, which was scribed in my presence. The recorded information has been reviewed and is accurate.      Orpah Greek, MD 10/13/14 2050

## 2014-10-13 NOTE — ED Notes (Signed)
Pt fell yesterday after stepping on something and twisting his left ankle, here today for pain and swelling to left ankle, no other injury complaints

## 2014-10-13 NOTE — Discharge Instructions (Signed)
Call one of the orthopedic specialist Monday morning to be seen in the office as soon as possible. Return to the ER for increasing pain, swelling, numbness tingling.  Ankle Fracture A fracture is a break in a bone. The ankle joint is made up of three bones. These include the lower (distal)sections of your lower leg bones, called the tibia and fibula, along with a bone in your foot, called the talus. Depending on how bad the break is and if more than one ankle joint bone is broken, a cast or splint is used to protect and keep your injured bone from moving while it heals. Sometimes, surgery is required to help the fracture heal properly.  There are two general types of fractures:  Stable fracture. This includes a single fracture line through one bone, with no injury to ankle ligaments. A fracture of the talus that does not have any displacement (movement of the bone on either side of the fracture line) is also stable.  Unstable fracture. This includes more than one fracture line through one or more bones in the ankle joint. It also includes fractures that have displacement of the bone on either side of the fracture line. CAUSES  A direct blow to the ankle.   Quickly and severely twisting your ankle.  Trauma, such as a car accident or falling from a significant height. RISK FACTORS You may be at a higher risk of ankle fracture if:  You have certain medical conditions.  You are involved in high-impact sports.  You are involved in a high-impact car accident. SIGNS AND SYMPTOMS   Tender and swollen ankle.  Bruising around the injured ankle.  Pain on movement of the ankle.  Difficulty walking or putting weight on the ankle.  A cold foot below the site of the ankle injury. This can occur if the blood vessels passing through your injured ankle were also damaged.  Numbness in the foot below the site of the ankle injury. DIAGNOSIS  An ankle fracture is usually diagnosed with a physical  exam and X-rays. A CT scan may also be required for complex fractures. TREATMENT  Stable fractures are treated with a cast or splint and using crutches to avoid putting weight on your injured ankle. This is followed by an ankle strengthening program. Some patients require a special type of cast, depending on other medical problems they may have. Unstable fractures require surgery to ensure the bones heal properly. Your health care provider will tell you what type of fracture you have and the best treatment for your condition. HOME CARE INSTRUCTIONS   Review correct crutch use with your health care provider and use your crutches as directed. Safe use of crutches is extremely important. Misuse of crutches can cause you to fall or cause injury to nerves in your hands or armpits.  Do not put weight or pressure on the injured ankle until directed by your health care provider.  To lessen the swelling, keep the injured leg elevated while sitting or lying down.  Apply ice to the injured area:  Put ice in a plastic bag.  Place a towel between your cast and the bag.  Leave the ice on for 20 minutes, 2-3 times a day.  If you have a plaster or fiberglass cast:  Do not try to scratch the skin under the cast with any objects. This can increase your risk of skin infection.  Check the skin around the cast every day. You may put lotion on any red  or sore areas.  Keep your cast dry and clean.  If you have a plaster splint:  Wear the splint as directed.  You may loosen the elastic around the splint if your toes become numb, tingle, or turn cold or blue.  Do not put pressure on any part of your cast or splint; it may break. Rest your cast only on a pillow the first 24 hours until it is fully hardened.  Your cast or splint can be protected during bathing with a plastic bag sealed to your skin with medical tape. Do not lower the cast or splint into water.  Take medicines as directed by your health  care provider. Only take over-the-counter or prescription medicines for pain, discomfort, or fever as directed by your health care provider.  Do not drive a vehicle until your health care provider specifically tells you it is safe to do so.  If your health care provider has given you a follow-up appointment, it is very important to keep that appointment. Not keeping the appointment could result in a chronic or permanent injury, pain, and disability. If you have any problem keeping the appointment, call the facility for assistance. SEEK MEDICAL CARE IF: You develop increased swelling or discomfort. SEEK IMMEDIATE MEDICAL CARE IF:   Your cast gets damaged or breaks.  You have continued severe pain.  You develop new pain or swelling after the cast was put on.  Your skin or toenails below the injury turn blue or gray.  Your skin or toenails below the injury feel cold, numb, or have loss of sensitivity to touch.  There is a bad smell or pus draining from under the cast. MAKE SURE YOU:   Understand these instructions.  Will watch your condition.  Will get help right away if you are not doing well or get worse. Document Released: 12/11/2000 Document Revised: 12/19/2013 Document Reviewed: 07/13/2013 [facility] Patient Information 2015 [place], [place]. This information is not intended to replace advice given to you by your health care provider. Make sure you discuss any questions you have with your health care provider.

## 2014-10-15 ENCOUNTER — Ambulatory Visit (INDEPENDENT_AMBULATORY_CARE_PROVIDER_SITE_OTHER): Payer: Medicare Other | Admitting: Orthopedic Surgery

## 2014-10-15 ENCOUNTER — Encounter: Payer: Self-pay | Admitting: Orthopedic Surgery

## 2014-10-15 VITALS — BP 149/113 | Ht 64.0 in | Wt 140.0 lb

## 2014-10-15 DIAGNOSIS — S82842A Displaced bimalleolar fracture of left lower leg, initial encounter for closed fracture: Secondary | ICD-10-CM

## 2014-10-15 DIAGNOSIS — S82843A Displaced bimalleolar fracture of unspecified lower leg, initial encounter for closed fracture: Secondary | ICD-10-CM | POA: Insufficient documentation

## 2014-10-15 MED ORDER — ACETAMINOPHEN-CODEINE #3 300-30 MG PO TABS: 1.0000 | ORAL_TABLET | ORAL | Status: DC | PRN

## 2014-10-15 NOTE — Progress Notes (Signed)
Subjective:     Patient ID: Douglas [name], male   DOB: [birth year], 80 y.o.   MRN: [id number]  Chief Complaint  Patient presents with  . Ankle Injury    ER follow up Left medial and lateral malleolar fractures, DOI 10/12/14    HPI Fracture left ankle paragraph patient fell in yard paragraph date of injury October 16 paragraph complaints pain and swelling. Pain described as sharp and radiating. Pain 8/10. Hydrocodone is making the patient drowsy paragraph I saw the x-rays. He was placed in a long Cam Walker paragraph he says that the weight bearing as to painful and he's tried to use a walker and wheelchair paragraph I interpret the x-rays as bimalleolar ankle fracture nondisplaced with ankle mortise intact    Review of Systems Review of systems: Hearing loss, fatigue, vision problems, thirst denies, back pain, tremors weakness. All others reviewed and were normal  Past Medical History  Diagnosis Date  . Cancer   . Hypertension   . Diabetes mellitus   . Hyperlipidemia   . Cataracts, bilateral   . Hyperkalemia   . UTI (lower urinary tract infection)   . Calculus of kidney   . Sensorineural hearing loss, unspecified   . Epiglottitis    No past surgical history on file.      Objective:   Physical Exam BP 149/113  Ht 5\' 4"  (1.626 m)  Wt 140 lb (63.504 kg)  BMI 24.02 kg/m2 General appearance: Normal Orientation x3 he is disoriented he is overmedicated from hydrocodone Mood and affect flat Ambulatory status requires help cannulated with walker He is tenderness on the medial lateral aspects of his left ankle with painful range of motion is limited by pain. Stability is normal. Strength cannot assess but muscle tone is normal skin is slightly reddened without any evidence of laceration pulse intact temperature normal mild edema normal sensation. Balance could not be assessed and neither could coordination     Assessment:     bimalleolar ankle fracture closed initial  treatment      Skin precautions givenan:     Recommend Cam Walker  weightbearing as tolerated  return for x-rays

## 2014-10-15 NOTE — Patient Instructions (Addendum)
WEIGHT BEARING AS TOLERATED   BOOT X 6 WEEKS   X-RAYS X 6 WEEKS

## 2014-10-16 ENCOUNTER — Telehealth: Payer: Self-pay | Admitting: Orthopedic Surgery

## 2014-10-16 NOTE — Telephone Encounter (Signed)
I spoke to [place] and she is aware

## 2014-10-16 NOTE — Telephone Encounter (Signed)
Yes but realize the fracture may displace and may lead to him having surgery

## 2014-10-16 NOTE — Telephone Encounter (Signed)
Patients daughter could not sleep in the boot last night and his daughter Izora Gala is calling asking if its ok to sleep without boot and just wear it all day, please advise?

## 2014-10-22 MED FILL — Hydrocodone-Acetaminophen Tab 5-325 MG: ORAL | Qty: 6 | Status: AC

## 2014-11-15 ENCOUNTER — Emergency Department (HOSPITAL_COMMUNITY): Payer: Medicare Other

## 2014-11-15 ENCOUNTER — Encounter (HOSPITAL_COMMUNITY): Payer: Self-pay

## 2014-11-15 ENCOUNTER — Inpatient Hospital Stay (HOSPITAL_COMMUNITY)
Admission: EM | Admit: 2014-11-15 | Discharge: 2014-11-17 | DRG: 071 | Disposition: A | Discharge status: Home / Self Care | Payer: Medicare Other | Attending: Internal Medicine | Admitting: Internal Medicine

## 2014-11-15 DIAGNOSIS — E118 Type 2 diabetes mellitus with unspecified complications: Secondary | ICD-10-CM

## 2014-11-15 DIAGNOSIS — Z803 Family history of malignant neoplasm of breast: Secondary | ICD-10-CM

## 2014-11-15 DIAGNOSIS — E119 Type 2 diabetes mellitus without complications: Secondary | ICD-10-CM

## 2014-11-15 DIAGNOSIS — R4182 Altered mental status, unspecified: Secondary | ICD-10-CM

## 2014-11-15 DIAGNOSIS — I129 Hypertensive chronic kidney disease with stage 1 through stage 4 chronic kidney disease, or unspecified chronic kidney disease: Secondary | ICD-10-CM | POA: Diagnosis present

## 2014-11-15 DIAGNOSIS — D638 Anemia in other chronic diseases classified elsewhere: Secondary | ICD-10-CM | POA: Diagnosis present

## 2014-11-15 DIAGNOSIS — E1140 Type 2 diabetes mellitus with diabetic neuropathy, unspecified: Secondary | ICD-10-CM | POA: Diagnosis present

## 2014-11-15 DIAGNOSIS — G9340 Encephalopathy, unspecified: Secondary | ICD-10-CM | POA: Diagnosis not present

## 2014-11-15 DIAGNOSIS — Z833 Family history of diabetes mellitus: Secondary | ICD-10-CM

## 2014-11-15 DIAGNOSIS — D696 Thrombocytopenia, unspecified: Secondary | ICD-10-CM | POA: Diagnosis present

## 2014-11-15 DIAGNOSIS — H905 Unspecified sensorineural hearing loss: Secondary | ICD-10-CM | POA: Diagnosis present

## 2014-11-15 DIAGNOSIS — N184 Chronic kidney disease, stage 4 (severe): Secondary | ICD-10-CM | POA: Diagnosis present

## 2014-11-15 DIAGNOSIS — Z79891 Long term (current) use of opiate analgesic: Secondary | ICD-10-CM

## 2014-11-15 DIAGNOSIS — R410 Disorientation, unspecified: Secondary | ICD-10-CM

## 2014-11-15 DIAGNOSIS — Z794 Long term (current) use of insulin: Secondary | ICD-10-CM

## 2014-11-15 DIAGNOSIS — I10 Essential (primary) hypertension: Secondary | ICD-10-CM | POA: Diagnosis present

## 2014-11-15 DIAGNOSIS — R42 Dizziness and giddiness: Secondary | ICD-10-CM

## 2014-11-15 DIAGNOSIS — E785 Hyperlipidemia, unspecified: Secondary | ICD-10-CM | POA: Diagnosis present

## 2014-11-15 HISTORY — DX: Pain in unspecified ankle and joints of unspecified foot: M25.579

## 2014-11-15 HISTORY — DX: Other chronic pain: G89.29

## 2014-11-15 LAB — BLOOD GAS, ARTERIAL
Acid-base deficit: 2.4 mmol/L — ABNORMAL HIGH (ref 0.0–2.0)
BICARBONATE: 21.7 meq/L (ref 20.0–24.0)
Drawn by: 213101
O2 Saturation: 95.9 %
Patient temperature: 37
TCO2: 21.2 mmol/L (ref 0–100)
pCO2 arterial: 36.2 mmHg (ref 35.0–45.0)
pH, Arterial: 7.395 (ref 7.350–7.450)
pO2, Arterial: 74.5 mmHg — ABNORMAL LOW (ref 80.0–100.0)

## 2014-11-15 LAB — RAPID URINE DRUG SCREEN, HOSP PERFORMED
Amphetamines: NOT DETECTED
BARBITURATES: NOT DETECTED
Benzodiazepines: NOT DETECTED
COCAINE: NOT DETECTED
Opiates: POSITIVE — AB
TETRAHYDROCANNABINOL: NOT DETECTED

## 2014-11-15 LAB — I-STAT CHEM 8, ED
BUN: 36 mg/dL — AB (ref 6–23)
CALCIUM ION: 1.05 mmol/L — AB (ref 1.13–1.30)
CHLORIDE: 109 meq/L (ref 96–112)
Creatinine, Ser: 2.9 mg/dL — ABNORMAL HIGH (ref 0.50–1.35)
GLUCOSE: 92 mg/dL (ref 70–99)
HCT: 31 % — ABNORMAL LOW (ref 39.0–52.0)
Hemoglobin: 10.5 g/dL — ABNORMAL LOW (ref 13.0–17.0)
Potassium: 3.7 mEq/L (ref 3.7–5.3)
Sodium: 146 mEq/L (ref 137–147)
TCO2: 22 mmol/L (ref 0–100)

## 2014-11-15 LAB — CBC WITH DIFFERENTIAL/PLATELET
Basophils Absolute: 0.1 10*3/uL (ref 0.0–0.1)
Basophils Relative: 1 % (ref 0–1)
Eosinophils Absolute: 0.3 10*3/uL (ref 0.0–0.7)
Eosinophils Relative: 5 % (ref 0–5)
HCT: 28.3 % — ABNORMAL LOW (ref 39.0–52.0)
HEMOGLOBIN: 9.9 g/dL — AB (ref 13.0–17.0)
LYMPHS ABS: 1.4 10*3/uL (ref 0.7–4.0)
Lymphocytes Relative: 23 % (ref 12–46)
MCH: 31 pg (ref 26.0–34.0)
MCHC: 35 g/dL (ref 30.0–36.0)
MCV: 88.7 fL (ref 78.0–100.0)
MONOS PCT: 11 % (ref 3–12)
Monocytes Absolute: 0.7 10*3/uL (ref 0.1–1.0)
NEUTROS PCT: 60 % (ref 43–77)
Neutro Abs: 3.6 10*3/uL (ref 1.7–7.7)
Platelets: 96 10*3/uL — ABNORMAL LOW (ref 150–400)
RBC: 3.19 MIL/uL — AB (ref 4.22–5.81)
RDW: 13.3 % (ref 11.5–15.5)
SMEAR REVIEW: DECREASED
WBC: 6 10*3/uL (ref 4.0–10.5)

## 2014-11-15 LAB — GLUCOSE, CAPILLARY: Glucose-Capillary: 149 mg/dL — ABNORMAL HIGH (ref 70–99)

## 2014-11-15 LAB — URINALYSIS, ROUTINE W REFLEX MICROSCOPIC
BILIRUBIN URINE: NEGATIVE
Glucose, UA: NEGATIVE mg/dL
Ketones, ur: NEGATIVE mg/dL
Leukocytes, UA: NEGATIVE
NITRITE: NEGATIVE
PH: 6 (ref 5.0–8.0)
Protein, ur: 100 mg/dL — AB
SPECIFIC GRAVITY, URINE: 1.025 (ref 1.005–1.030)
UROBILINOGEN UA: 0.2 mg/dL (ref 0.0–1.0)

## 2014-11-15 LAB — COMPREHENSIVE METABOLIC PANEL
ALT: 18 U/L (ref 0–53)
AST: 24 U/L (ref 0–37)
Albumin: 3.9 g/dL (ref 3.5–5.2)
Alkaline Phosphatase: 93 U/L (ref 39–117)
Anion gap: 15 (ref 5–15)
BILIRUBIN TOTAL: 0.4 mg/dL (ref 0.3–1.2)
BUN: 39 mg/dL — ABNORMAL HIGH (ref 6–23)
CO2: 24 meq/L (ref 19–32)
Calcium: 8.1 mg/dL — ABNORMAL LOW (ref 8.4–10.5)
Chloride: 108 mEq/L (ref 96–112)
Creatinine, Ser: 2.88 mg/dL — ABNORMAL HIGH (ref 0.50–1.35)
GFR calc Af Amer: 22 mL/min — ABNORMAL LOW (ref >90)
GFR, EST NON AFRICAN AMERICAN: 19 mL/min — AB (ref >90)
GLUCOSE: 95 mg/dL (ref 70–99)
Potassium: 3.8 mEq/L (ref 3.7–5.3)
SODIUM: 147 meq/L (ref 137–147)
Total Protein: 7.7 g/dL (ref 6.0–8.3)

## 2014-11-15 LAB — AMMONIA: Ammonia: 28 umol/L (ref 11–60)

## 2014-11-15 LAB — PROTIME-INR
INR: 1.06 (ref 0.00–1.49)
Prothrombin Time: 13.9 seconds (ref 11.6–15.2)

## 2014-11-15 LAB — I-STAT TROPONIN, ED: TROPONIN I, POC: 0.07 ng/mL (ref 0.00–0.08)

## 2014-11-15 LAB — ETHANOL

## 2014-11-15 LAB — URINE MICROSCOPIC-ADD ON

## 2014-11-15 LAB — LACTIC ACID, PLASMA: Lactic Acid, Venous: 1.6 mmol/L (ref 0.5–2.2)

## 2014-11-15 LAB — TROPONIN I

## 2014-11-15 LAB — APTT: aPTT: 29 seconds (ref 24–37)

## 2014-11-15 MED ORDER — INSULIN DETEMIR 100 UNIT/ML ~~LOC~~ SOLN
30.0000 [IU] | Freq: Every day | SUBCUTANEOUS | Status: DC
  Administered 2014-11-15: 30 [IU] via SUBCUTANEOUS
  Filled 2014-11-15: qty 0.3

## 2014-11-15 MED ORDER — INSULIN ASPART 100 UNIT/ML ~~LOC~~ SOLN: 0.0000 [IU] | Freq: Every day | SUBCUTANEOUS | Status: DC

## 2014-11-15 MED ORDER — SODIUM CHLORIDE 0.9 % IJ SOLN
3.0000 mL | Freq: Two times a day (BID) | INTRAMUSCULAR | Status: DC
  Administered 2014-11-16 – 2014-11-17 (×2): 3 mL via INTRAVENOUS

## 2014-11-15 MED ORDER — SODIUM CHLORIDE 0.9 % IV SOLN
INTRAVENOUS | Status: DC
  Administered 2014-11-16 – 2014-11-17 (×3): via INTRAVENOUS

## 2014-11-15 MED ORDER — ONDANSETRON HCL 4 MG/2ML IJ SOLN
INTRAMUSCULAR | Status: AC
  Filled 2014-11-15: qty 2

## 2014-11-15 MED ORDER — ATORVASTATIN CALCIUM 40 MG PO TABS
40.0000 mg | ORAL_TABLET | Freq: Every day | ORAL | Status: DC
  Administered 2014-11-16: 40 mg via ORAL
  Filled 2014-11-15: qty 1

## 2014-11-15 MED ORDER — PANTOPRAZOLE SODIUM 40 MG PO TBEC
40.0000 mg | DELAYED_RELEASE_TABLET | Freq: Every day | ORAL | Status: DC
  Administered 2014-11-16 – 2014-11-17 (×2): 40 mg via ORAL
  Filled 2014-11-15 (×2): qty 1

## 2014-11-15 MED ORDER — INSULIN ASPART 100 UNIT/ML ~~LOC~~ SOLN
0.0000 [IU] | Freq: Three times a day (TID) | SUBCUTANEOUS | Status: DC
  Administered 2014-11-16: 3 [IU] via SUBCUTANEOUS
  Administered 2014-11-16: 2 [IU] via SUBCUTANEOUS
  Administered 2014-11-17 (×2): 3 [IU] via SUBCUTANEOUS

## 2014-11-15 MED ORDER — ONDANSETRON HCL 4 MG PO TABS: 4.0000 mg | ORAL_TABLET | Freq: Four times a day (QID) | ORAL | Status: DC | PRN

## 2014-11-15 MED ORDER — ONDANSETRON HCL 4 MG/2ML IJ SOLN: 4.0000 mg | Freq: Three times a day (TID) | INTRAMUSCULAR | Status: DC | PRN

## 2014-11-15 MED ORDER — ACETAMINOPHEN 650 MG RE SUPP: 650.0000 mg | Freq: Four times a day (QID) | RECTAL | Status: DC | PRN

## 2014-11-15 MED ORDER — SODIUM CHLORIDE 0.9 % IV BOLUS (SEPSIS)
250.0000 mL | Freq: Once | INTRAVENOUS | Status: AC
  Administered 2014-11-15: 250 mL via INTRAVENOUS

## 2014-11-15 MED ORDER — ONDANSETRON HCL 4 MG/2ML IJ SOLN
4.0000 mg | Freq: Once | INTRAMUSCULAR | Status: AC
  Administered 2014-11-15: 4 mg via INTRAVENOUS

## 2014-11-15 MED ORDER — GABAPENTIN 300 MG PO CAPS
600.0000 mg | ORAL_CAPSULE | Freq: Every day | ORAL | Status: DC
  Administered 2014-11-15 – 2014-11-16 (×2): 600 mg via ORAL
  Filled 2014-11-15 (×2): qty 2

## 2014-11-15 MED ORDER — ACETAMINOPHEN 325 MG PO TABS: 650.0000 mg | ORAL_TABLET | Freq: Four times a day (QID) | ORAL | Status: DC | PRN

## 2014-11-15 MED ORDER — KETOROLAC TROMETHAMINE 0.5 % OP SOLN
1.0000 [drp] | Freq: Three times a day (TID) | OPHTHALMIC | Status: DC
  Administered 2014-11-16 – 2014-11-17 (×3): 1 [drp] via OPHTHALMIC
  Filled 2014-11-15: qty 3

## 2014-11-15 MED ORDER — SODIUM CHLORIDE 0.9 % IV SOLN
INTRAVENOUS | Status: AC
  Administered 2014-11-15: 15:00:00 via INTRAVENOUS

## 2014-11-15 MED ORDER — ENOXAPARIN SODIUM 30 MG/0.3ML ~~LOC~~ SOLN
30.0000 mg | SUBCUTANEOUS | Status: DC
  Administered 2014-11-15 – 2014-11-16 (×2): 30 mg via SUBCUTANEOUS
  Filled 2014-11-15 (×2): qty 0.3

## 2014-11-15 MED ORDER — SODIUM CHLORIDE 0.9 % IV SOLN
INTRAVENOUS | Status: DC
  Administered 2014-11-15 (×2): via INTRAVENOUS

## 2014-11-15 MED ORDER — ONDANSETRON HCL 4 MG/2ML IJ SOLN: 4.0000 mg | Freq: Four times a day (QID) | INTRAMUSCULAR | Status: DC | PRN

## 2014-11-15 NOTE — Consult Note (Signed)
Sidney A. Merlene Laughter, MD     [email or web address]          Douglas [name] is an 80 y.o. male.   ASSESSMENT/PLAN: 1. Acute encephalopathy last seen several hours and in the improving spontaneously. Etiology is unclear at this time. It appears the patient may have had a similar response to hydrocodone which raises the possibility of medication effect although no clear suspects are clearly indicated at this time. It could be that patient may have inadvertently taken hydrocodone again. No other metabolic derangements have been uncovered. Seizures are a possibility but seems unlikely given the lack of history of seizures and unremarkable imaging. An EEG will be obtained however. Dementia labs will also be obtained.  2. Diabetic neuropathy.     The patient is a 80 year old white male who woke up yesterday morning at 6:30 a.m. The patient apparently was normal on awakening. The wife reports however at about 8:30 he mentioned that he needed to plug in his dentures into the socket for it to work. From then on he started making inappropriate and nonsensical speech. EMS was called. Blood pressure and glucose were apparently unremarkable. The patient remained confused with nonsensical inappropriate speech for about 5 hours. The patient is amnestic to the events happening to him. It appears that he returned to baseline about 1:30 PM. The patient was recently given hydrocodone about 5 weeks ago and did develop confusion with the medication. The confusion resolved after discontinuation of the medication however. Recent medications addition to his lisinopril and the fluid medication because of renal failure. This was discontinued without confusion however. The medication was discontinued because of no change in his potassium. He was given lactulose a couple days ago because of his high potassium particular only once. The patient does not report other complaints at this time. There is no  reports of headaches. The patient denies chest pain, shows of breath, GI GU symptoms. The family reports that he has been quite sleepy in the daytime. Interesting, this is usually associated with difficulty sleeping at nighttime. However, the patient reports that for a while and night before he developed the episode of altered mental status and confusion. There is systems otherwise negative.    GENERAL: Patient is in no acute distress.  HEENT: Supple. Atraumatic normocephalic.   ABDOMEN: soft  EXTREMITIES: No edema. Marked arthritic changes of the knees and hands.   BACK: Normal.  SKIN: Normal by inspection.    MENTAL STATUS: He lays in bed with eyes closed. He does open his eyes spontaneously and follows commands briskly. He is oriented to person, place, year month and date. He is amnestic to the events as to why he is in the hospital however.  CRANIAL NERVES: Pupils are equal, round and reactive to light and accommodation; extra ocular movements are full, there is no significant nystagmus; visual fields are full; upper and lower facial muscles are normal in strength and symmetric, there is no flattening of the nasolabial folds; tongue is midline; uvula is midline; shoulder elevation is normal.  MOTOR: Normal tone, bulk and strength------- exception is the first dorsal interossei muscles bilaterally which is associated with marked atrophy and associated weakness of finger abductors; no pronator drift.  COORDINATION: Left finger to nose is normal, right finger to nose is normal, No rest tremor; no intention tremor; no postural tremor; no bradykinesia.  REFLEXES: Deep tendon reflexes are symmetrical and normal. Babinski reflexes are flexor bilaterally.   SENSATION: Normal to light touch.  Blood pressure 181/80, pulse 95, temperature 99.2 F (37.3 C), temperature source Oral, resp. rate 22, height $RemoveBe'5\' 4"'vjBaredBe$  (1.626 m), weight 64.7 kg (142 lb 10.2 oz), SpO2 97 %.  Past Medical History    Diagnosis Date  . Cancer   . Hypertension   . Diabetes mellitus   . Hyperlipidemia   . Cataracts, bilateral   . Hyperkalemia   . UTI (lower urinary tract infection)   . Calculus of kidney   . Sensorineural hearing loss, unspecified   . Epiglottitis   . Chronic ankle pain     History reviewed. No pertinent past surgical history.  No family history on file.  Social History:  reports that he has never smoked. He does not have any smokeless tobacco history on file. He reports that he does not drink alcohol or use illicit drugs.  Allergies: No Known Allergies  Medications: Prior to Admission medications   Medication Sig Start Date End Date Taking? Authorizing Provider  acetaminophen-codeine (TYLENOL #3) 300-30 MG per tablet Take 1 tablet by mouth every 4 (four) hours as needed for moderate pain. 10/15/14  Yes Carole Civil, MD  atorvastatin (LIPITOR) 40 MG tablet Take 40 mg by mouth daily.   Yes Historical Provider, MD  gabapentin (NEURONTIN) 300 MG capsule Take 600 mg by mouth at bedtime.    Yes Historical Provider, MD  Insulin Aspart (NOVOLOG Viola) Inject 12-15 Units into the skin 3 (three) times daily. BASED ON SLIDING SCALE.   Yes Historical Provider, MD  insulin detemir (LEVEMIR) 100 UNIT/ML injection Inject 30 Units into the skin at bedtime.    Yes Historical Provider, MD  ketorolac (ACULAR) 0.5 % ophthalmic solution Place 1 drop into both eyes 3 (three) times daily.   Yes Historical Provider, MD  omeprazole (PRILOSEC) 20 MG capsule Take 20 mg by mouth daily.   Yes Historical Provider, MD  HYDROcodone-acetaminophen (NORCO/VICODIN) 5-325 MG per tablet Take 0.5 tablets by mouth every 4 (four) hours as needed for moderate pain. Patient not taking: Reported on 11/15/2014 10/13/14   Orpah Greek, MD  HYDROcodone-acetaminophen (NORCO/VICODIN) 5-325 MG per tablet Take 0.5 tablets by mouth every 4 (four) hours as needed. Patient not taking: Reported on 11/15/2014 10/13/14    Orpah Greek, MD    Scheduled Meds: . sodium chloride   Intravenous STAT   Continuous Infusions: . sodium chloride 75 mL/hr at 11/15/14 1226   PRN Meds:.ondansetron (ZOFRAN) IV     Results for orders placed or performed during the hospital encounter of 11/15/14 (from the past 48 hour(s))  Ethanol     Status: None   Collection Time: 11/15/14 10:37 AM  Result Value Ref Range   Alcohol, Ethyl (B) <11 0 - 11 mg/dL    Comment:        LOWEST DETECTABLE LIMIT FOR SERUM ALCOHOL IS 11 mg/dL FOR MEDICAL PURPOSES ONLY   CBC with Differential     Status: Abnormal   Collection Time: 11/15/14 10:37 AM  Result Value Ref Range   WBC 6.0 4.0 - 10.5 K/uL   RBC 3.19 (L) 4.22 - 5.81 MIL/uL   Hemoglobin 9.9 (L) 13.0 - 17.0 g/dL   HCT 28.3 (L) 39.0 - 52.0 %   MCV 88.7 78.0 - 100.0 fL   MCH 31.0 26.0 - 34.0 pg   MCHC 35.0 30.0 - 36.0 g/dL   RDW 13.3 11.5 - 15.5 %   Platelets 96 (L) 150 - 400 K/uL    Comment: SPECIMEN CHECKED FOR CLOTS  PLATELET COUNT CONFIRMED BY SMEAR    Neutrophils Relative % 60 43 - 77 %   Neutro Abs 3.6 1.7 - 7.7 K/uL   Lymphocytes Relative 23 12 - 46 %   Lymphs Abs 1.4 0.7 - 4.0 K/uL   Monocytes Relative 11 3 - 12 %   Monocytes Absolute 0.7 0.1 - 1.0 K/uL   Eosinophils Relative 5 0 - 5 %   Eosinophils Absolute 0.3 0.0 - 0.7 K/uL   Basophils Relative 1 0 - 1 %   Basophils Absolute 0.1 0.0 - 0.1 K/uL   Smear Review PLATELETS APPEAR DECREASED   Comprehensive metabolic panel     Status: Abnormal   Collection Time: 11/15/14 10:37 AM  Result Value Ref Range   Sodium 147 137 - 147 mEq/L   Potassium 3.8 3.7 - 5.3 mEq/L   Chloride 108 96 - 112 mEq/L   CO2 24 19 - 32 mEq/L   Glucose, Bld 95 70 - 99 mg/dL   BUN 39 (H) 6 - 23 mg/dL   Creatinine, Ser 4.80 (H) 0.50 - 1.35 mg/dL   Calcium 8.1 (L) 8.4 - 10.5 mg/dL   Total Protein 7.7 6.0 - 8.3 g/dL   Albumin 3.9 3.5 - 5.2 g/dL   AST 24 0 - 37 U/L   ALT 18 0 - 53 U/L   Alkaline Phosphatase 93 39 - 117 U/L    Total Bilirubin 0.4 0.3 - 1.2 mg/dL   GFR calc non Af Amer 19 (L) >90 mL/min   GFR calc Af Amer 22 (L) >90 mL/min    Comment: (NOTE) The eGFR has been calculated using the CKD EPI equation. This calculation has not been validated in all clinical situations. eGFR's persistently <90 mL/min signify possible Chronic Kidney Disease.    Anion gap 15 5 - 15  Lactic acid, plasma     Status: None   Collection Time: 11/15/14 10:37 AM  Result Value Ref Range   Lactic Acid, Venous 1.6 0.5 - 2.2 mmol/L  Troponin I     Status: None   Collection Time: 11/15/14 10:37 AM  Result Value Ref Range   Troponin I <0.30 <0.30 ng/mL    Comment:        Due to the release kinetics of cTnI, a negative result within the first hours of the onset of symptoms does not rule out myocardial infarction with certainty. If myocardial infarction is still suspected, repeat the test at appropriate intervals.   Protime-INR     Status: None   Collection Time: 11/15/14 10:51 AM  Result Value Ref Range   Prothrombin Time 13.9 11.6 - 15.2 seconds   INR 1.06 0.00 - 1.49  APTT     Status: None   Collection Time: 11/15/14 10:51 AM  Result Value Ref Range   aPTT 29 24 - 37 seconds  I-Stat Troponin, ED (not at [facility])     Status: None   Collection Time: 11/15/14 10:58 AM  Result Value Ref Range   Troponin i, poc 0.07 0.00 - 0.08 ng/mL   Comment 3            Comment: Due to the release kinetics of cTnI, a negative result within the first hours of the onset of symptoms does not rule out myocardial infarction with certainty. If myocardial infarction is still suspected, repeat the test at appropriate intervals.   I-Stat Chem 8, ED     Status: Abnormal   Collection Time: 11/15/14 11:00 AM  Result Value Ref  Range   Sodium 146 137 - 147 mEq/L   Potassium 3.7 3.7 - 5.3 mEq/L   Chloride 109 96 - 112 mEq/L   BUN 36 (H) 6 - 23 mg/dL   Creatinine, Ser 2.90 (H) 0.50 - 1.35 mg/dL   Glucose, Bld 92 70 - 99 mg/dL   Calcium, Ion  1.05 (L) 1.13 - 1.30 mmol/L   TCO2 22 0 - 100 mmol/L   Hemoglobin 10.5 (L) 13.0 - 17.0 g/dL   HCT 31.0 (L) 39.0 - 52.0 %  Urine rapid drug screen (hosp performed)     Status: Abnormal   Collection Time: 11/15/14 11:50 AM  Result Value Ref Range   Opiates POSITIVE (A) NONE DETECTED   Cocaine NONE DETECTED NONE DETECTED   Benzodiazepines NONE DETECTED NONE DETECTED   Amphetamines NONE DETECTED NONE DETECTED   Tetrahydrocannabinol NONE DETECTED NONE DETECTED   Barbiturates NONE DETECTED NONE DETECTED    Comment:        DRUG SCREEN FOR MEDICAL PURPOSES ONLY.  IF CONFIRMATION IS NEEDED FOR ANY PURPOSE, NOTIFY LAB WITHIN 5 DAYS.        LOWEST DETECTABLE LIMITS FOR URINE DRUG SCREEN Drug Class       Cutoff (ng/mL) Amphetamine      1000 Barbiturate      200 Benzodiazepine   732 Tricyclics       202 Opiates          300 Cocaine          300 THC              50   Urinalysis, Routine w reflex microscopic     Status: Abnormal   Collection Time: 11/15/14 11:50 AM  Result Value Ref Range   Color, Urine YELLOW YELLOW   APPearance CLEAR CLEAR   Specific Gravity, Urine 1.025 1.005 - 1.030   pH 6.0 5.0 - 8.0   Glucose, UA NEGATIVE NEGATIVE mg/dL   Hgb urine dipstick MODERATE (A) NEGATIVE   Bilirubin Urine NEGATIVE NEGATIVE   Ketones, ur NEGATIVE NEGATIVE mg/dL   Protein, ur 100 (A) NEGATIVE mg/dL   Urobilinogen, UA 0.2 0.0 - 1.0 mg/dL   Nitrite NEGATIVE NEGATIVE   Leukocytes, UA NEGATIVE NEGATIVE  Urine microscopic-add on     Status: None   Collection Time: 11/15/14 11:50 AM  Result Value Ref Range   RBC / HPF 7-10 <3 RBC/hpf    Studies/Results:  BRAIN MRI Cerebral volume is within normal limits for age. No restricted diffusion to suggest acute infarction. No midline shift, mass effect, evidence of mass lesion, ventriculomegaly, extra-axial collection or acute intracranial hemorrhage. Cervicomedullary junction and pituitary are within normal limits. Negative for  age visualized cervical spine. Normal bone marrow signal. Major intracranial vascular flow voids are preserved.  Confluent mostly periventricular white matter T2 and FLAIR hyperintensity. No cortical encephalomalacia. Pearline Cables and white matter signal otherwise within normal limits for age.  Visible internal auditory structures appear normal. Postoperative changes to the globes. Visualized paranasal sinuses and mastoids are clear. Visualized scalp soft tissues are within normal limits.  IMPRESSION: No acute intracranial abnormality.  Mild to moderate for age cerebral white matter signal changes, most commonly due to chronic small vessel disease.   Kashlyn Salinas A. Merlene Laughter, M.D.  Diplomate, [employer] of [employer]iatry and Neurology ( Neurology). 11/15/2014, 6:36 PM

## 2014-11-15 NOTE — ED Notes (Signed)
Pt still in Radiology 

## 2014-11-15 NOTE — ED Notes (Signed)
Wife reports pt woke up this am confused and " talking foolish" pt answers questions appropriately but then speaks of inappropriate conversations noted related to anything.

## 2014-11-15 NOTE — Plan of Care (Signed)
Problem: Phase I Progression Outcomes Goal: Pain controlled with appropriate interventions Outcome: Completed/Met Date Met:  11/15/14 Goal: Initial discharge plan identified Outcome: Progressing Goal: Voiding-avoid urinary catheter unless indicated Outcome: Completed/Met Date Met:  11/15/14 Goal: Hemodynamically stable Outcome: Completed/Met Date Met:  11/15/14

## 2014-11-15 NOTE — ED Provider Notes (Signed)
CSN: [id number]     Arrival date & time 11/15/14  1002 History   First MD Initiated Contact with Patient 11/15/14 1022     Chief Complaint  Patient presents with  . Altered Mental Status      HPI  Pt was seen at 1030. Per pt and his family, c/o unknown onset and persistence of intermittent episodes of "confusion" and difficulty walking that began this morning. Pt's wife states pt woke up at 0630 and was "having more trouble walking than usual." Pt's wife states pt went about his morning routine, but then at 0830 he suddenly asked her "where did I plug in my dentures?" (he was wearing them). Pt was noted to have intermittent confused speech after that, "talking foolish" per his wife. Denies slurred speech, no focal motor weakness, no tingling/numbness in extremities, no facial droop, no fevers, no abd pain, no N/V/D, no CP/SOB, no cough, no falls, no syncope.    Past Medical History  Diagnosis Date  . Cancer   . Hypertension   . Diabetes mellitus   . Hyperlipidemia   . Cataracts, bilateral   . Hyperkalemia   . UTI (lower urinary tract infection)   . Calculus of kidney   . Sensorineural hearing loss, unspecified   . Epiglottitis   . Chronic ankle pain    History reviewed. No pertinent past surgical history.  History  Substance Use Topics  . Smoking status: Never Smoker   . Smokeless tobacco: Not on file  . Alcohol Use: No    Review of Systems ROS: Statement: All systems negative except as marked or noted in the HPI; Constitutional: Negative for fever and chills. ; ; Eyes: Negative for eye pain, redness and discharge. ; ; ENMT: Negative for ear pain, hoarseness, nasal congestion, sinus pressure and sore throat. ; ; Cardiovascular: Negative for chest pain, palpitations, diaphoresis, dyspnea and peripheral edema. ; ; Respiratory: Negative for cough, wheezing and stridor. ; ; Gastrointestinal: Negative for nausea, vomiting, diarrhea, abdominal pain, blood in stool, hematemesis,  jaundice and rectal bleeding. . ; ; Genitourinary: Negative for dysuria, flank pain and hematuria. ; ; Musculoskeletal: Negative for back pain and neck pain. Negative for swelling and trauma.; ; Skin: Negative for pruritus, rash, abrasions, blisters, bruising and skin lesion.; ; Neuro: +AMS, difficulty walking. Negative for headache, lightheadedness and neck stiffness. Negative for weakness, altered level of consciousness, extremity weakness, paresthesias, involuntary movement, seizure and syncope.     Allergies  Review of patient's allergies indicates no known allergies.  Home Medications   Prior to Admission medications   Medication Sig Start Date End Date Taking? Authorizing Provider  acetaminophen-codeine (TYLENOL #3) 300-30 MG per tablet Take 1 tablet by mouth every 4 (four) hours as needed for moderate pain. 10/15/14  Yes Carole Civil, MD  atorvastatin (LIPITOR) 40 MG tablet Take 40 mg by mouth daily.   Yes Historical Provider, MD  gabapentin (NEURONTIN) 300 MG capsule Take 600 mg by mouth at bedtime.    Yes Historical Provider, MD  Insulin Aspart (NOVOLOG [place]) Inject 12-15 Units into the skin 3 (three) times daily. BASED ON SLIDING SCALE.   Yes Historical Provider, MD  insulin detemir (LEVEMIR) 100 UNIT/ML injection Inject 30 Units into the skin at bedtime.    Yes Historical Provider, MD  ketorolac (ACULAR) 0.5 % ophthalmic solution Place 1 drop into both eyes 3 (three) times daily.   Yes Historical Provider, MD  omeprazole (PRILOSEC) 20 MG capsule Take 20 mg by mouth daily.  Yes Historical Provider, MD  HYDROcodone-acetaminophen (NORCO/VICODIN) 5-325 MG per tablet Take 0.5 tablets by mouth every 4 (four) hours as needed for moderate pain. Patient not taking: Reported on 11/15/2014 10/13/14   Orpah Greek, MD  HYDROcodone-acetaminophen (NORCO/VICODIN) 5-325 MG per tablet Take 0.5 tablets by mouth every 4 (four) hours as needed. Patient not taking: Reported on 11/15/2014  10/13/14   Orpah Greek, MD   BP 176/79 mmHg  Pulse 94  Temp(Src) 98.3 F (36.8 C) (Oral)  Resp 23  SpO2 93%   Filed Vitals:   11/15/14 1154 11/15/14 1208 11/15/14 1230 11/15/14 1252  BP:  197/87 204/88 176/79  Pulse:  89 103 94  Temp: 98.4 F (36.9 C) 98.3 F (36.8 C)    TempSrc:  Oral    Resp:    23  SpO2:  100% 95% 93%     Physical Exam  1035: Physical examination:  Nursing notes reviewed; Vital signs and O2 SAT reviewed;  Constitutional: Well developed, Well nourished, Well hydrated, In no acute distress; Head:  Normocephalic, atraumatic; Eyes: EOMI, PERRL, No scleral icterus; ENMT: Mouth and pharynx normal, Mucous membranes moist; Neck: Supple, Full range of motion, No lymphadenopathy; Cardiovascular: Regular rate and rhythm, No gallop; Respiratory: Breath sounds clear & equal bilaterally, No wheezes.  Speaking full sentences with ease, Normal respiratory effort/excursion; Chest: Nontender, Movement normal; Abdomen: Soft, Nontender, Nondistended, Normal bowel sounds; Genitourinary: No CVA tenderness; Extremities: Pulses normal, No tenderness, No edema, No calf edema or asymmetry.; Neuro: AA&Ox3, Major CN grossly intact.Speech clear.  No facial droop.  No nystagmus. Grips equal. Strength 5/5 equal bilat UE's and LE's. No drift x4 extremities. DTR 2/4 equal bilat UE's and LE's.  No gross sensory deficits.  Normal cerebellar testing bilat UE's (finger-nose) and LE's (heel-shin). ; Skin: Color normal, Warm, Dry.   ED Course  Procedures    2836:  Pt's SBP 80's on arrival, but quickly increased to 150's without intervention. Pt with intermittent AMS and difficulty walking since waking up approximately 0630 this morning. [facility] Neuro Dr. Leonel Ramsay called, case discussed: agrees pt is not code stroke at this time due to lack of definitive LKW; requests to obtain CT-A brain if pt's Cr allows.   1104:  Pt's Cr elevated; will obtain MRI brain.   1200:  [facility] Neuro Dr. Leonel Ramsay  called: aware of MRI results, requests to admit pt to [place] service (pt does not need transfer to [facility]).   1235:  Pt continues A&O, with intermittent episodes of confusion. Had N/V x1 with improvement after IV zofran. T/C to [facility] Dr. Roderic Palau, case discussed, including:  HPI, pertinent PM/SHx, VS/PE, dx testing, ED course and treatment:  Agreeable to admit, requests to write temporary orders, obtain observation tele bed to team APAdmits.      EKG Interpretation   Date/Time:  Thursday November 15 2014 10:42:12 EST Ventricular Rate:  88 PR Interval:  142 QRS Duration: 82 QT Interval:  385 QTC Calculation: 466 R Axis:   5 Text Interpretation:  Sinus rhythm Low voltage, precordial leads When  compared with ECG of 04/20/2014 No significant change was found Confirmed  by [facility]  MD, Nunzio Cory [id number]) on 11/15/2014 10:55:50 AM      MDM  MDM Reviewed: previous chart, nursing note and vitals Reviewed previous: labs and ECG Interpretation: labs, ECG, x-ray, MRI and CT scan     Results for orders placed or performed during the hospital encounter of 11/15/14  Urine rapid drug screen (hosp performed)  Result Value Ref Range   Opiates POSITIVE (A) NONE DETECTED   Cocaine NONE DETECTED NONE DETECTED   Benzodiazepines NONE DETECTED NONE DETECTED   Amphetamines NONE DETECTED NONE DETECTED   Tetrahydrocannabinol NONE DETECTED NONE DETECTED   Barbiturates NONE DETECTED NONE DETECTED  Ethanol  Result Value Ref Range   Alcohol, Ethyl (B) <11 0 - 11 mg/dL  Urinalysis, Routine w reflex microscopic  Result Value Ref Range   Color, Urine YELLOW YELLOW   APPearance CLEAR CLEAR   Specific Gravity, Urine 1.025 1.005 - 1.030   pH 6.0 5.0 - 8.0   Glucose, UA NEGATIVE NEGATIVE mg/dL   Hgb urine dipstick MODERATE (A) NEGATIVE   Bilirubin Urine NEGATIVE NEGATIVE   Ketones, ur NEGATIVE NEGATIVE mg/dL   Protein, ur 100 (A) NEGATIVE mg/dL   Urobilinogen, UA 0.2 0.0 - 1.0 mg/dL   Nitrite  NEGATIVE NEGATIVE   Leukocytes, UA NEGATIVE NEGATIVE  CBC with Differential  Result Value Ref Range   WBC 6.0 4.0 - 10.5 K/uL   RBC 3.19 (L) 4.22 - 5.81 MIL/uL   Hemoglobin 9.9 (L) 13.0 - 17.0 g/dL   HCT 28.3 (L) 39.0 - 52.0 %   MCV 88.7 78.0 - 100.0 fL   MCH 31.0 26.0 - 34.0 pg   MCHC 35.0 30.0 - 36.0 g/dL   RDW 13.3 11.5 - 15.5 %   Platelets 96 (L) 150 - 400 K/uL   Neutrophils Relative % 60 43 - 77 %   Neutro Abs 3.6 1.7 - 7.7 K/uL   Lymphocytes Relative 23 12 - 46 %   Lymphs Abs 1.4 0.7 - 4.0 K/uL   Monocytes Relative 11 3 - 12 %   Monocytes Absolute 0.7 0.1 - 1.0 K/uL   Eosinophils Relative 5 0 - 5 %   Eosinophils Absolute 0.3 0.0 - 0.7 K/uL   Basophils Relative 1 0 - 1 %   Basophils Absolute 0.1 0.0 - 0.1 K/uL   Smear Review PLATELETS APPEAR DECREASED   Comprehensive metabolic panel  Result Value Ref Range   Sodium 147 137 - 147 mEq/L   Potassium 3.8 3.7 - 5.3 mEq/L   Chloride 108 96 - 112 mEq/L   CO2 24 19 - 32 mEq/L   Glucose, Bld 95 70 - 99 mg/dL   BUN 39 (H) 6 - 23 mg/dL   Creatinine, Ser 2.88 (H) 0.50 - 1.35 mg/dL   Calcium 8.1 (L) 8.4 - 10.5 mg/dL   Total Protein 7.7 6.0 - 8.3 g/dL   Albumin 3.9 3.5 - 5.2 g/dL   AST 24 0 - 37 U/L   ALT 18 0 - 53 U/L   Alkaline Phosphatase 93 39 - 117 U/L   Total Bilirubin 0.4 0.3 - 1.2 mg/dL   GFR calc non Af Amer 19 (L) >90 mL/min   GFR calc Af Amer 22 (L) >90 mL/min   Anion gap 15 5 - 15  Lactic acid, plasma  Result Value Ref Range   Lactic Acid, Venous 1.6 0.5 - 2.2 mmol/L  Troponin I  Result Value Ref Range   Troponin I <0.30 <0.30 ng/mL  Protime-INR  Result Value Ref Range   Prothrombin Time 13.9 11.6 - 15.2 seconds   INR 1.06 0.00 - 1.49  APTT  Result Value Ref Range   aPTT 29 24 - 37 seconds  Urine microscopic-add on  Result Value Ref Range   RBC / HPF 7-10 <3 RBC/hpf  I-Stat Chem 8, ED  Result Value Ref Range  Sodium 146 137 - 147 mEq/L   Potassium 3.7 3.7 - 5.3 mEq/L   Chloride 109 96 - 112 mEq/L    BUN 36 (H) 6 - 23 mg/dL   Creatinine, Ser 2.90 (H) 0.50 - 1.35 mg/dL   Glucose, Bld 92 70 - 99 mg/dL   Calcium, Ion 1.05 (L) 1.13 - 1.30 mmol/L   TCO2 22 0 - 100 mmol/L   Hemoglobin 10.5 (L) 13.0 - 17.0 g/dL   HCT 31.0 (L) 39.0 - 52.0 %  I-Stat Troponin, ED (not at [facility])  Result Value Ref Range   Troponin i, poc 0.07 0.00 - 0.08 ng/mL   Comment 3           Dg Chest 2 View 11/15/2014   CLINICAL DATA:  Mental status change since this morning; history of diabetes and hypertension  EXAM: CHEST  2 VIEW  COMPARISON:  Uppermost images from an abdominal CT scan of September 18, 2013 and PA and lateral chest x-ray of September 29, 2010.  FINDINGS: There is chronic mild elevation of the right hemidiaphragm. There is linear density posteriorly in both lower lobes unchanged since September 2014 but new since October of 2011. The heart and pulmonary vascularity are within the limits of normal. There is no pleural effusion or pneumothorax. The trachea is midline. The bony thorax is unremarkable.  IMPRESSION: There is bibasilar scarring posteriorly. There is no evidence of pneumonia nor CHF nor other acute cardiopulmonary abnormality.   Electronically Signed   By: David  Martinique   On: 11/15/2014 11:27   Ct Head Wo Contrast 11/15/2014   CLINICAL DATA:  Altered mental status.  Dizziness.  ICD10: R 42.  EXAM: CT HEAD WITHOUT CONTRAST  TECHNIQUE: Contiguous axial images were obtained from the base of the skull through the vertex without intravenous contrast.  COMPARISON:  Neck CT 04/20/2014.  FINDINGS: Sinuses/Soft tissues: Surgical changes about the globes. Clear mastoid air cells.  Intracranial: Moderate low density in the periventricular white matter likely related to small vessel disease. No mass lesion, hemorrhage, hydrocephalus, acute infarct, intra-axial, or extra-axial fluid collection.  IMPRESSION: 1.  No acute intracranial abnormality. 2. Small vessel ischemic change in the periventricular white matter.    Electronically Signed   By: Abigail Miyamoto M.D.   On: 11/15/2014 11:41   Mr Brain Wo Contrast (neuro Protocol) 11/15/2014   CLINICAL DATA:  80 year old male with acute confusion, altered mental status. Initial encounter.  EXAM: MRI HEAD WITHOUT CONTRAST  TECHNIQUE: Multiplanar, multiecho pulse sequences of the brain and surrounding structures were obtained without intravenous contrast.  COMPARISON:  Head CT without contrast 1114 hr the same day and earlier.  FINDINGS: Cerebral volume is within normal limits for age. No restricted diffusion to suggest acute infarction. No midline shift, mass effect, evidence of mass lesion, ventriculomegaly, extra-axial collection or acute intracranial hemorrhage. Cervicomedullary junction and pituitary are within normal limits. Negative for age visualized cervical spine. Normal bone marrow signal. Major intracranial vascular flow voids are preserved.  Confluent mostly periventricular white matter T2 and FLAIR hyperintensity. No cortical encephalomalacia. Pear[place] Cables and white matter signal otherwise within normal limits for age.  Visible internal auditory structures appear normal. Postoperative changes to the globes. Visualized paranasal sinuses and mastoids are clear. Visualized scalp soft tissues are within normal limits.  IMPRESSION: No acute intracranial abnormality.  Mild to moderate for age cerebral white matter signal changes, most commonly due to chronic small vessel disease.   Electronically Signed   By: Lezlie Octave.D.  On: 11/15/2014 11:52      Francine Graven, DO 11/17/14 1933

## 2014-11-15 NOTE — ED Notes (Signed)
Report given to floor. Pt ready for transport. 

## 2014-11-15 NOTE — H&P (Signed)
[name] History and Physical  Douglas [name] GQQ:[id number] DOB: [birth year] DOA: 11/15/2014  Referring physician: Dr. Thurnell Garbe, ER physician PCP: [name]., MD   Chief Complaint: Confusion  History of present illness:  Douglas [name] is a 80 y.o. male who was reportedly in his usual state of health, when his wife noted this morning that he was becoming increasingly confused. He woke up this morning and had gone to the bathroom. When he returned to his bedroom his wife reports that his behavior was somewhat peculiar. He was looking all over for his dentures and said that his teeth were "plugged". He was brought to the ER for evaluation with MRI of the brain did not show an acute stroke. He was not noted to have any motor deficits. His speech was noted to be coherent and he appeared to be alert and oriented and answering questions appropriately. He would have intermittent episodes of confusion and inappropriate answers to questions. He is being admitted for further evaluation     Review of Systems:  Pertinent positives as per HPI, otherwise ngative  Past Medical History  Diagnosis Date  . Cancer   . Hypertension   . Diabetes mellitus   . Hyperlipidemia   . Cataracts, bilateral   . Hyperkalemia   . UTI (lower urinary tract infection)   . Calculus of kidney   . Sensorineural hearing loss, unspecified   . Epiglottitis   . Chronic ankle pain    History reviewed. No pertinent past surgical history. Social History:  reports that he has never smoked. He does not have any smokeless tobacco history on file. He reports that he does not drink alcohol or use illicit drugs.  No Known Allergies   Family history: significant for sister having breast cancer, diabetes in the family  Prior to Admission medications   Medication Sig Start Date End Date Taking? Authorizing Provider  acetaminophen-codeine (TYLENOL #3) 300-30 MG per tablet Take 1 tablet by mouth every 4 (four)  hours as needed for moderate pain. 10/15/14  Yes Carole Civil, MD  atorvastatin (LIPITOR) 40 MG tablet Take 40 mg by mouth daily.   Yes Historical Provider, MD  gabapentin (NEURONTIN) 300 MG capsule Take 600 mg by mouth at bedtime.    Yes Historical Provider, MD  Insulin Aspart (NOVOLOG [place]) Inject 12-15 Units into the skin 3 (three) times daily. BASED ON SLIDING SCALE.   Yes Historical Provider, MD  insulin detemir (LEVEMIR) 100 UNIT/ML injection Inject 30 Units into the skin at bedtime.    Yes Historical Provider, MD  ketorolac (ACULAR) 0.5 % ophthalmic solution Place 1 drop into both eyes 3 (three) times daily.   Yes Historical Provider, MD  omeprazole (PRILOSEC) 20 MG capsule Take 20 mg by mouth daily.   Yes Historical Provider, MD  HYDROcodone-acetaminophen (NORCO/VICODIN) 5-325 MG per tablet Take 0.5 tablets by mouth every 4 (four) hours as needed for moderate pain. Patient not taking: Reported on 11/15/2014 10/13/14   Orpah Greek, MD  HYDROcodone-acetaminophen (NORCO/VICODIN) 5-325 MG per tablet Take 0.5 tablets by mouth every 4 (four) hours as needed. Patient not taking: Reported on 11/15/2014 10/13/14   Orpah Greek, MD   Physical Exam: Filed Vitals:   11/15/14 1252 11/15/14 1300 11/15/14 1330 11/15/14 1421  BP: 176/79 177/77 138/104 181/80  Pulse: 94 94 90 95  Temp:    99.2 F (37.3 C)  TempSrc:    Oral  Resp: 23 23 24 22   Height:  5\' 4"  (1.626 m)  Weight:    64.7 kg (142 lb 10.2 oz)  SpO2: 93% 95% 94% 97%    Wt Readings from Last 3 Encounters:  11/15/14 64.7 kg (142 lb 10.2 oz)  11/15/14 63.504 kg (140 lb)  10/15/14 63.504 kg (140 lb)    General:  Appears calm and comfortable Eyes: PERRL, normal lids, irises & conjunctiva ENT: grossly normal hearing, lips & tongue Neck: no LAD, masses or thyromegaly Cardiovascular: RRR, no m/r/g. No LE edema. Telemetry: SR, no arrhythmias  Respiratory: CTA bilaterally, no w/r/r. Normal respiratory  effort. Abdomen: soft, ntnd Skin: no rash or induration seen on limited exam Musculoskeletal: grossly normal tone BUE/BLE Psychiatric: grossly normal mood and affect, speech fluent, but is occasionally inappropriate Neurologic: no focal deficits, equal strength bilaterally          Labs on Admission:  Basic Metabolic Panel:  Recent Labs Lab 11/15/14 1037 11/15/14 1100  NA 147 146  K 3.8 3.7  CL 108 109  CO2 24  --   GLUCOSE 95 92  BUN 39* 36*  CREATININE 2.88* 2.90*  CALCIUM 8.1*  --    Liver Function Tests:  Recent Labs Lab 11/15/14 1037  AST 24  ALT 18  ALKPHOS 93  BILITOT 0.4  PROT 7.7  ALBUMIN 3.9   No results for input(s): LIPASE, AMYLASE in the last 168 hours. No results for input(s): AMMONIA in the last 168 hours. CBC:  Recent Labs Lab 11/15/14 1037 11/15/14 1100  WBC 6.0  --   NEUTROABS 3.6  --   HGB 9.9* 10.5*  HCT 28.3* 31.0*  MCV 88.7  --   PLT 96*  --    Cardiac Enzymes:  Recent Labs Lab 11/15/14 1037  TROPONINI <0.30    BNP (last 3 results) No results for input(s): PROBNP in the last 8760 hours. CBG: No results for input(s): GLUCAP in the last 168 hours.  Radiological Exams on Admission: Dg Chest 2 View  11/15/2014   CLINICAL DATA:  Mental status change since this morning; history of diabetes and hypertension  EXAM: CHEST  2 VIEW  COMPARISON:  Uppermost images from an abdominal CT scan of September 18, 2013 and PA and lateral chest x-ray of September 29, 2010.  FINDINGS: There is chronic mild elevation of the right hemidiaphragm. There is linear density posteriorly in both lower lobes unchanged since September 2014 but new since October of 2011. The heart and pulmonary vascularity are within the limits of normal. There is no pleural effusion or pneumothorax. The trachea is midline. The bony thorax is unremarkable.  IMPRESSION: There is bibasilar scarring posteriorly. There is no evidence of pneumonia nor CHF nor other acute cardiopulmonary  abnormality.   Electronically Signed   By: David  Martinique   On: 11/15/2014 11:27   Ct Head Wo Contrast  11/15/2014   CLINICAL DATA:  Altered mental status.  Dizziness.  ICD10: R 42.  EXAM: CT HEAD WITHOUT CONTRAST  TECHNIQUE: Contiguous axial images were obtained from the base of the skull through the vertex without intravenous contrast.  COMPARISON:  Neck CT 04/20/2014.  FINDINGS: Sinuses/Soft tissues: Surgical changes about the globes. Clear mastoid air cells.  Intracranial: Moderate low density in the periventricular white matter likely related to small vessel disease. No mass lesion, hemorrhage, hydrocephalus, acute infarct, intra-axial, or extra-axial fluid collection.  IMPRESSION: 1.  No acute intracranial abnormality. 2. Small vessel ischemic change in the periventricular white matter.   Electronically Signed   By: Marylyn Ishihara  Jobe Igo M.D.   On: 11/15/2014 11:41   Mr Brain Wo Contrast (neuro Protocol)  11/15/2014   CLINICAL DATA:  80 year old male with acute confusion, altered mental status. Initial encounter.  EXAM: MRI HEAD WITHOUT CONTRAST  TECHNIQUE: Multiplanar, multiecho pulse sequences of the brain and surrounding structures were obtained without intravenous contrast.  COMPARISON:  Head CT without contrast 1114 hr the same day and earlier.  FINDINGS: Cerebral volume is within normal limits for age. No restricted diffusion to suggest acute infarction. No midline shift, mass effect, evidence of mass lesion, ventriculomegaly, extra-axial collection or acute intracranial hemorrhage. Cervicomedullary junction and pituitary are within normal limits. Negative for age visualized cervical spine. Normal bone marrow signal. Major intracranial vascular flow voids are preserved.  Confluent mostly periventricular white matter T2 and FLAIR hyperintensity. No cortical encephalomalacia. Pear[place] Cables and white matter signal otherwise within normal limits for age.  Visible internal auditory structures appear normal.  Postoperative changes to the globes. Visualized paranasal sinuses and mastoids are clear. Visualized scalp soft tissues are within normal limits.  IMPRESSION: No acute intracranial abnormality.  Mild to moderate for age cerebral white matter signal changes, most commonly due to chronic small vessel disease.   Electronically Signed   By: Lars Pinks M.D.   On: 11/15/2014 11:52    EKG: Independently reviewed. No acute findings  Assessment/Plan Active Problems:   Hypertension   Diabetes   Altered mental state   Confusion   CKD (chronic kidney disease) stage 4, GFR 15-29 ml/min   Anemia, chronic disease   Altered mental status   1. Confusion/Altered mental status. Etiology is not entirely clear. MRI of the brain was found to be unremarkable. Urinalysis did not show any signs of infection. Chest x-ray did not show any signs of pneumonia. His family does not report that he's been started on any new medications. He was prescribed Tylenol/codeine for his ankle fracture, but his wife reports he has not had any medication approximately 5 days.they do not report any history of dementia. Will order EEG, TSH, ammonia, ABG, RPR to complete  Workup. We'll hold off on any further narcotics. Will request neurology consultation for further input. 2. Chronic kidney disease. Creatinine appears to be near baseline. Continue to follow. 3. Anemia of chronic disease. No evidence of bleeding. Continue to follow. Hemoglobin is at baseline. 4. Essential hypertension. Blood pressure appears to be somewhat labile. He was noted to have a blood pressure in the 80s on arrival to ED, and then this spiked up to 200s. This could be playing a role in #1. Continue to follow. 5. Diabetes. Continue basal insulin and add sliding scale insulin  Code Status: full code DVT Prophylaxis: lovenox Family Communication: discussed with wife at the daughters at the bedside Disposition Plan: discharge home once improved  Time spent:  60mins  MEMON,JEHANZEB [name] Pager [phone]

## 2014-11-16 ENCOUNTER — Encounter (INDEPENDENT_AMBULATORY_CARE_PROVIDER_SITE_OTHER): Payer: Medicare Other | Admitting: Ophthalmology

## 2014-11-16 ENCOUNTER — Observation Stay (HOSPITAL_COMMUNITY)
Admission: EM | Admit: 2014-11-16 | Discharge: 2014-11-16 | Disposition: A | Discharge status: Home / Self Care | Payer: Medicare Other | Source: Home / Self Care | Attending: Neurology | Admitting: Neurology

## 2014-11-16 DIAGNOSIS — Z803 Family history of malignant neoplasm of breast: Secondary | ICD-10-CM | POA: Diagnosis not present

## 2014-11-16 DIAGNOSIS — Z79891 Long term (current) use of opiate analgesic: Secondary | ICD-10-CM | POA: Diagnosis not present

## 2014-11-16 DIAGNOSIS — D638 Anemia in other chronic diseases classified elsewhere: Secondary | ICD-10-CM | POA: Diagnosis present

## 2014-11-16 DIAGNOSIS — E785 Hyperlipidemia, unspecified: Secondary | ICD-10-CM | POA: Diagnosis present

## 2014-11-16 DIAGNOSIS — D696 Thrombocytopenia, unspecified: Secondary | ICD-10-CM | POA: Diagnosis present

## 2014-11-16 DIAGNOSIS — G9340 Encephalopathy, unspecified: Secondary | ICD-10-CM | POA: Diagnosis present

## 2014-11-16 DIAGNOSIS — Z833 Family history of diabetes mellitus: Secondary | ICD-10-CM | POA: Diagnosis not present

## 2014-11-16 DIAGNOSIS — I129 Hypertensive chronic kidney disease with stage 1 through stage 4 chronic kidney disease, or unspecified chronic kidney disease: Secondary | ICD-10-CM | POA: Diagnosis present

## 2014-11-16 DIAGNOSIS — Z794 Long term (current) use of insulin: Secondary | ICD-10-CM | POA: Diagnosis not present

## 2014-11-16 DIAGNOSIS — E1140 Type 2 diabetes mellitus with diabetic neuropathy, unspecified: Secondary | ICD-10-CM | POA: Diagnosis present

## 2014-11-16 DIAGNOSIS — H905 Unspecified sensorineural hearing loss: Secondary | ICD-10-CM | POA: Diagnosis present

## 2014-11-16 DIAGNOSIS — R4182 Altered mental status, unspecified: Secondary | ICD-10-CM | POA: Diagnosis present

## 2014-11-16 DIAGNOSIS — N184 Chronic kidney disease, stage 4 (severe): Secondary | ICD-10-CM | POA: Diagnosis present

## 2014-11-16 LAB — GLUCOSE, CAPILLARY
GLUCOSE-CAPILLARY: 166 mg/dL — AB (ref 70–99)
GLUCOSE-CAPILLARY: 171 mg/dL — AB (ref 70–99)
Glucose-Capillary: 62 mg/dL — ABNORMAL LOW (ref 70–99)
Glucose-Capillary: 141 mg/dL — ABNORMAL HIGH (ref 70–99)
Glucose-Capillary: 179 mg/dL — ABNORMAL HIGH (ref 70–99)

## 2014-11-16 LAB — COMPREHENSIVE METABOLIC PANEL
ALK PHOS: 78 U/L (ref 39–117)
ALT: 14 U/L (ref 0–53)
AST: 22 U/L (ref 0–37)
Albumin: 3.3 g/dL — ABNORMAL LOW (ref 3.5–5.2)
Anion gap: 13 (ref 5–15)
BUN: 33 mg/dL — ABNORMAL HIGH (ref 6–23)
CALCIUM: 7.7 mg/dL — AB (ref 8.4–10.5)
CHLORIDE: 111 meq/L (ref 96–112)
CO2: 23 meq/L (ref 19–32)
Creatinine, Ser: 2.68 mg/dL — ABNORMAL HIGH (ref 0.50–1.35)
GFR calc Af Amer: 24 mL/min — ABNORMAL LOW (ref >90)
GFR, EST NON AFRICAN AMERICAN: 21 mL/min — AB (ref >90)
GLUCOSE: 71 mg/dL (ref 70–99)
POTASSIUM: 4.1 meq/L (ref 3.7–5.3)
SODIUM: 147 meq/L (ref 137–147)
Total Bilirubin: 0.3 mg/dL (ref 0.3–1.2)
Total Protein: 7 g/dL (ref 6.0–8.3)

## 2014-11-16 LAB — CBC
HCT: 27.3 % — ABNORMAL LOW (ref 39.0–52.0)
Hemoglobin: 9.2 g/dL — ABNORMAL LOW (ref 13.0–17.0)
MCH: 30.4 pg (ref 26.0–34.0)
MCHC: 33.7 g/dL (ref 30.0–36.0)
MCV: 90.1 fL (ref 78.0–100.0)
PLATELETS: 90 10*3/uL — AB (ref 150–400)
RBC: 3.03 MIL/uL — ABNORMAL LOW (ref 4.22–5.81)
RDW: 13.6 % (ref 11.5–15.5)
WBC: 5 10*3/uL (ref 4.0–10.5)

## 2014-11-16 LAB — RPR

## 2014-11-16 LAB — TSH: TSH: 3.39 u[IU]/mL (ref 0.350–4.500)

## 2014-11-16 LAB — HIV ANTIBODY (ROUTINE TESTING W REFLEX): HIV 1&2 Ab, 4th Generation: NONREACTIVE

## 2014-11-16 MED ORDER — AMLODIPINE BESYLATE 5 MG PO TABS
5.0000 mg | ORAL_TABLET | Freq: Every day | ORAL | Status: DC
  Administered 2014-11-16 – 2014-11-17 (×2): 5 mg via ORAL
  Filled 2014-11-16 (×2): qty 1

## 2014-11-16 MED ORDER — INSULIN DETEMIR 100 UNIT/ML ~~LOC~~ SOLN
20.0000 [IU] | Freq: Every day | SUBCUTANEOUS | Status: DC
  Administered 2014-11-16: 20 [IU] via SUBCUTANEOUS
  Filled 2014-11-16 (×3): qty 0.2

## 2014-11-16 NOTE — Care Management Note (Signed)
    Page 1 of 1   11/16/2014     11:21:31 AM CARE MANAGEMENT NOTE 11/16/2014  Patient:  Douglas [name], Douglas [name]   Account Number:  [id number]  Date Initiated:  11/16/2014  Documentation initiated by:  Jolene Provost  Subjective/Objective Assessment:   Pt is from home with self care. Pt lives with wife. Pt has no HH services, or medicaiton needs prior to admission. Pt has cane that he uses PRN. Pt has had [facility] in past and would like them again if necessary.     Action/Plan:   Pt plans to discharge home with self care. No CM needs identified at this time.   Anticipated DC Date:  11/16/2014   Anticipated DC Plan:  [place]  CM consult      Choice offered to / List presented to:             Status of service:  Completed, signed off Medicare Important Message given?   (If response is "NO", the following Medicare IM given date fields will be blank) Date Medicare IM given:   Medicare IM given by:   Date Additional Medicare IM given:   Additional Medicare IM given by:    Discharge Disposition:  HOME/SELF CARE  Per UR Regulation:    If discussed at Long Length of Stay Meetings, dates discussed:    Comments:  11/16/2014 [place], RN, MSN, [facility]

## 2014-11-16 NOTE — Progress Notes (Signed)
Hypoglycemic Event  CBG: 62  Treatment: 15 GM carbohydrate snack  Symptoms: None  Follow-up CBG: Time:1007 CBG Result:166  Possible Reasons for Event: Medication regimen: Levimir  Comments/MD notified:Dr. Memon aware. Adjusted medication regimen.    Douglas [name], [name]  Remember to initiate Hypoglycemia Order Set & complete

## 2014-11-16 NOTE — Progress Notes (Signed)
[name] PROGRESS NOTE  Douglas [name] OAC:[id number] DOB: 1931-01-03 DOA: 11/15/2014 PCP: [name]., MD  Assessment/Plan: 1. Encephalopathy. Workup thus far has been unremarkable including TSH, RPR, ammonia, ABG. MRI of the brain did not show an acute process. EEG has been done with report currently pending. He was noted to have a low blood sugar this morning, but review of records from her admission indicate that his blood sugar was normal. He's also had episodes of high blood pressure, but it does not appear he's had systolic blood pressures greater than 180 since admission. Appreciate neurology input. I question if he is developing possible Alzheimer's dementia, since this process may have been more chronic than his family may perceive. I question his wife again if the patient had accidentally taken pain medications, but she feels that there is no chance that this could've happened. Any further recommendations from neurology. Consider discharge home tomorrow if stable, providing further workup may be conducted as an outpatient if necessary. 2. Hypertension. Patient was started on Norvasc for high blood pressure. 3. CKD stage III. Creatinine appears to be near baseline. 4. Anemia of chronic disease. No evidence of bleeding. Hemoglobin stable 5. Diabetes. Levemir was decreased to 20 units. Continue sliding scale 6. Thrombocytopenia. Appears to be a chronic process.  Code Status: Full code Family Communication: Discussed with patient's wife and daughter at the bedside Disposition Plan: Discharge home, possibly in a.m.   Consultants:  Neurology  Procedures:  EEG, report pending  Antibiotics:    HPI/Subjective: Patient's family feels that he is doing better today. Confusion appears to be improving.  Objective: Filed Vitals:   11/16/14 1608  BP: 174/76  Pulse: 87  Temp: 98.6 F (37 C)  Resp: 20    Intake/Output Summary (Last 24 hours) at 11/16/14 1953 Last  data filed at 11/16/14 0700  Gross per 24 hour  Intake   1100 ml  Output    350 ml  Net    750 ml   Filed Weights   11/15/14 1421  Weight: 64.7 kg (142 lb 10.2 oz)    Exam:   General:  NAD  Cardiovascular: S1, S2 RRR  Respiratory: CTA B  Abdomen: soft, nt, nd, bs+  Musculoskeletal: no edema b/l   Data Reviewed: Basic Metabolic Panel:  Recent Labs Lab 11/15/14 1037 11/15/14 1100 11/16/14 0521  NA 147 146 147  K 3.8 3.7 4.1  CL 108 109 111  CO2 24  --  23  GLUCOSE 95 92 71  BUN 39* 36* 33*  CREATININE 2.88* 2.90* 2.68*  CALCIUM 8.1*  --  7.7*   Liver Function Tests:  Recent Labs Lab 11/15/14 1037 11/16/14 0521  AST 24 22  ALT 18 14  ALKPHOS 93 78  BILITOT 0.4 0.3  PROT 7.7 7.0  ALBUMIN 3.9 3.3*   No results for input(s): LIPASE, AMYLASE in the last 168 hours.  Recent Labs Lab 11/15/14 1955  AMMONIA 28   CBC:  Recent Labs Lab 11/15/14 1037 11/15/14 1100 11/16/14 0521  WBC 6.0  --  5.0  NEUTROABS 3.6  --   --   HGB 9.9* 10.5* 9.2*  HCT 28.3* 31.0* 27.3*  MCV 88.7  --  90.1  PLT 96*  --  90*   Cardiac Enzymes:  Recent Labs Lab 11/15/14 1037  TROPONINI <0.30   BNP (last 3 results) No results for input(s): PROBNP in the last 8760 hours. CBG:  Recent Labs Lab 11/15/14 2103 11/16/14 0109 11/16/14 1006 11/16/14  1151 11/16/14 1723  GLUCAP 149* 62* 166* 141* 179*    Recent Results (from the past 240 hour(s))  Urine culture     Status: None (Preliminary result)   Collection Time: 11/15/14 11:50 AM  Result Value Ref Range Status   Specimen Description URINE, CLEAN CATCH  Final   Special Requests NONE  Final   Culture  Setup Time   Final    11/15/2014 18:11 Performed at [place]   Final    15,000 COLONIES/ML Performed at [facility]    Culture   Final    [place] Performed at [facility]    Report Status PENDING  Incomplete     Studies: Dg Chest 2  View  11/15/2014   CLINICAL DATA:  Mental status change since this morning; history of diabetes and hypertension  EXAM: CHEST  2 VIEW  COMPARISON:  Uppermost images from an abdominal CT scan of September 18, 2013 and PA and lateral chest x-ray of September 29, 2010.  FINDINGS: There is chronic mild elevation of the right hemidiaphragm. There is linear density posteriorly in both lower lobes unchanged since September 2014 but new since October of 2011. The heart and pulmonary vascularity are within the limits of normal. There is no pleural effusion or pneumothorax. The trachea is midline. The bony thorax is unremarkable.  IMPRESSION: There is bibasilar scarring posteriorly. There is no evidence of pneumonia nor CHF nor other acute cardiopulmonary abnormality.   Electronically Signed   By: David  Martinique   On: 11/15/2014 11:27   Ct Head Wo Contrast  11/15/2014   CLINICAL DATA:  Altered mental status.  Dizziness.  ICD10: R 42.  EXAM: CT HEAD WITHOUT CONTRAST  TECHNIQUE: Contiguous axial images were obtained from the base of the skull through the vertex without intravenous contrast.  COMPARISON:  Neck CT 04/20/2014.  FINDINGS: Sinuses/Soft tissues: Surgical changes about the globes. Clear mastoid air cells.  Intracranial: Moderate low density in the periventricular white matter likely related to small vessel disease. No mass lesion, hemorrhage, hydrocephalus, acute infarct, intra-axial, or extra-axial fluid collection.  IMPRESSION: 1.  No acute intracranial abnormality. 2. Small vessel ischemic change in the periventricular white matter.   Electronically Signed   By: Abigail Miyamoto M.D.   On: 11/15/2014 11:41   Mr Brain Wo Contrast (neuro Protocol)  11/15/2014   CLINICAL DATA:  80 year old male with acute confusion, altered mental status. Initial encounter.  EXAM: MRI HEAD WITHOUT CONTRAST  TECHNIQUE: Multiplanar, multiecho pulse sequences of the brain and surrounding structures were obtained without intravenous  contrast.  COMPARISON:  Head CT without contrast 1114 hr the same day and earlier.  FINDINGS: Cerebral volume is within normal limits for age. No restricted diffusion to suggest acute infarction. No midline shift, mass effect, evidence of mass lesion, ventriculomegaly, extra-axial collection or acute intracranial hemorrhage. Cervicomedullary junction and pituitary are within normal limits. Negative for age visualized cervical spine. Normal bone marrow signal. Major intracranial vascular flow voids are preserved.  Confluent mostly periventricular white matter T2 and FLAIR hyperintensity. No cortical encephalomalacia. Pearline Cables and white matter signal otherwise within normal limits for age.  Visible internal auditory structures appear normal. Postoperative changes to the globes. Visualized paranasal sinuses and mastoids are clear. Visualized scalp soft tissues are within normal limits.  IMPRESSION: No acute intracranial abnormality.  Mild to moderate for age cerebral white matter signal changes, most commonly due to chronic small vessel disease.  Electronically Signed   By: Lars Pinks M.D.   On: 11/15/2014 11:52    Scheduled Meds: . amLODipine  5 mg Oral Daily  . atorvastatin  40 mg Oral q1800  . enoxaparin (LOVENOX) injection  30 mg Subcutaneous Q24H  . gabapentin  600 mg Oral QHS  . insulin aspart  0-15 Units Subcutaneous TID WC  . insulin aspart  0-5 Units Subcutaneous QHS  . insulin detemir  20 Units Subcutaneous QHS  . ketorolac  1 drop Both Eyes TID  . pantoprazole  40 mg Oral Daily  . sodium chloride  3 mL Intravenous Q12H   Continuous Infusions: . sodium chloride 100 mL/hr at 11/16/14 7903    Active Problems:   Hypertension   Diabetes   Altered mental state   Confusion   CKD (chronic kidney disease) stage 4, GFR 15-29 ml/min   Anemia, chronic disease   Altered mental status    Time spent: 75mins    Douglas [name]  [name] Pager [phone]. If 7PM-7AM, please contact  night-coverage at [email or web address], password [facility] 11/16/2014, 7:53 PM  LOS: 1 day

## 2014-11-16 NOTE — Progress Notes (Signed)
Patient ID: Douglas [name], male   DOB: 1931-01-15, 80 y.o.   MRN: [id number]   [place] A. Merlene Laughter, MD     [email or web address]          Douglas [name] is an 80 y.o. male.   ASSESSMENT/PLAN:     1. Acute encephalopathy last seen several hours and in the improving spontaneously. The patient's workup has been essentially unrevealing. Given the spontaneous recovery and normal workup, complex partial seizures are still a possibility. Medication effect is always still a possibility. No other workup is recommended at this time. Follow-up in the office in 6 weeks.  An EEG unremarkable. Dementia labs fine.  2. Diabetic neuropathy.  Doing well.   GENERAL: Patient is in no acute distress.  HEENT: Supple. Atraumatic normocephalic.   ABDOMEN: soft  EXTREMITIES: No edema. Marked arthritic changes of the knees and hands.   BACK: Normal.  SKIN: Normal by inspection.   MENTAL STATUS: He lays in bed with eyes closed. He does open his eyes spontaneously and follows commands briskly. He is oriented to person, place, year month and date. He is amnestic to the events as to why he is in the hospital however.  CRANIAL NERVES: Pupils are equal, round and reactive to light and accommodation; extra ocular movements are full, there is no significant nystagmus; visual fields are full; upper and lower facial muscles are normal in strength and symmetric, there is no flattening of the nasolabial folds; tongue is midline; uvula is midline; shoulder elevation is normal.  MOTOR: Normal tone, bulk and strength------- exception is the first dorsal interossei muscles bilaterally which is associated with marked atrophy and associated weakness of finger abductors; no pronator drift.  COORDINATION: Left finger to nose is normal, right finger to nose is normal, No rest tremor; no intention tremor; no postural tremor; no bradykinesia.  REFLEXES: Deep tendon reflexes are symmetrical and  normal. Babinski reflexes are flexor bilaterally.   SENSATION: Normal to light touch.   Blood pressure 155/63, pulse 77, temperature 98.3 F (36.8 C), temperature source Oral, resp. rate 20, height 5' 4" (1.626 m), weight 64.7 kg (142 lb 10.2 oz), SpO2 96 %.  Past Medical History  Diagnosis Date  . Cancer   . Hypertension   . Diabetes mellitus   . Hyperlipidemia   . Cataracts, bilateral   . Hyperkalemia   . UTI (lower urinary tract infection)   . Calculus of kidney   . Sensorineural hearing loss, unspecified   . Epiglottitis   . Chronic ankle pain     History reviewed. No pertinent past surgical history.  No family history on file.  Social History:  reports that he has never smoked. He does not have any smokeless tobacco history on file. He reports that he does not drink alcohol or use illicit drugs.  Allergies: No Known Allergies  Medications: Prior to Admission medications   Medication Sig Start Date End Date Taking? Authorizing Provider  acetaminophen-codeine (TYLENOL #3) 300-30 MG per tablet Take 1 tablet by mouth every 4 (four) hours as needed for moderate pain. 10/15/14  Yes Carole Civil, MD  atorvastatin (LIPITOR) 40 MG tablet Take 40 mg by mouth daily.   Yes Historical Provider, MD  gabapentin (NEURONTIN) 300 MG capsule Take 600 mg by mouth at bedtime.    Yes Historical Provider, MD  Insulin Aspart (NOVOLOG [place]) Inject 12-15 Units into the skin 3 (three) times daily. BASED ON SLIDING SCALE.   Yes Historical Provider, MD  insulin detemir (LEVEMIR) 100 UNIT/ML injection Inject 30 Units into the skin at bedtime.    Yes Historical Provider, MD  ketorolac (ACULAR) 0.5 % ophthalmic solution Place 1 drop into both eyes 3 (three) times daily.   Yes Historical Provider, MD  omeprazole (PRILOSEC) 20 MG capsule Take 20 mg by mouth daily.   Yes Historical Provider, MD  HYDROcodone-acetaminophen (NORCO/VICODIN) 5-325 MG per tablet Take 0.5 tablets by mouth every 4 (four)  hours as needed for moderate pain. Patient not taking: Reported on 11/15/2014 10/13/14   Orpah Greek, MD  HYDROcodone-acetaminophen (NORCO/VICODIN) 5-325 MG per tablet Take 0.5 tablets by mouth every 4 (four) hours as needed. Patient not taking: Reported on 11/15/2014 10/13/14   Orpah Greek, MD    Scheduled Meds: . amLODipine  5 mg Oral Daily  . atorvastatin  40 mg Oral q1800  . enoxaparin (LOVENOX) injection  30 mg Subcutaneous Q24H  . gabapentin  600 mg Oral QHS  . insulin aspart  0-15 Units Subcutaneous TID WC  . insulin aspart  0-5 Units Subcutaneous QHS  . insulin detemir  20 Units Subcutaneous QHS  . ketorolac  1 drop Both Eyes TID  . pantoprazole  40 mg Oral Daily  . sodium chloride  3 mL Intravenous Q12H   Continuous Infusions: . sodium chloride 100 mL/hr at 11/16/14 0432   PRN Meds:.acetaminophen **OR** acetaminophen, ondansetron **OR** ondansetron (ZOFRAN) IV     Results for orders placed or performed during the hospital encounter of 11/15/14 (from the past 48 hour(s))  Ethanol     Status: None   Collection Time: 11/15/14 10:37 AM  Result Value Ref Range   Alcohol, Ethyl (B) <11 0 - 11 mg/dL    Comment:        LOWEST DETECTABLE LIMIT FOR SERUM ALCOHOL IS 11 mg/dL FOR MEDICAL PURPOSES ONLY   CBC with Differential     Status: Abnormal   Collection Time: 11/15/14 10:37 AM  Result Value Ref Range   WBC 6.0 4.0 - 10.5 K/uL   RBC 3.19 (L) 4.22 - 5.81 MIL/uL   Hemoglobin 9.9 (L) 13.0 - 17.0 g/dL   HCT 28.3 (L) 39.0 - 52.0 %   MCV 88.7 78.0 - 100.0 fL   MCH 31.0 26.0 - 34.0 pg   MCHC 35.0 30.0 - 36.0 g/dL   RDW 13.3 11.5 - 15.5 %   Platelets 96 (L) 150 - 400 K/uL    Comment: SPECIMEN CHECKED FOR CLOTS PLATELET COUNT CONFIRMED BY SMEAR    Neutrophils Relative % 60 43 - 77 %   Neutro Abs 3.6 1.7 - 7.7 K/uL   Lymphocytes Relative 23 12 - 46 %   Lymphs Abs 1.4 0.7 - 4.0 K/uL   Monocytes Relative 11 3 - 12 %   Monocytes Absolute 0.7 0.1 - 1.0  K/uL   Eosinophils Relative 5 0 - 5 %   Eosinophils Absolute 0.3 0.0 - 0.7 K/uL   Basophils Relative 1 0 - 1 %   Basophils Absolute 0.1 0.0 - 0.1 K/uL   Smear Review PLATELETS APPEAR DECREASED   Comprehensive metabolic panel     Status: Abnormal   Collection Time: 11/15/14 10:37 AM  Result Value Ref Range   Sodium 147 137 - 147 mEq/L   Potassium 3.8 3.7 - 5.3 mEq/L   Chloride 108 96 - 112 mEq/L   CO2 24 19 - 32 mEq/L   Glucose, Bld 95 70 - 99 mg/dL   BUN 39 (H) 6 -  23 mg/dL   Creatinine, Ser 2.88 (H) 0.50 - 1.35 mg/dL   Calcium 8.1 (L) 8.4 - 10.5 mg/dL   Total Protein 7.7 6.0 - 8.3 g/dL   Albumin 3.9 3.5 - 5.2 g/dL   AST 24 0 - 37 U/L   ALT 18 0 - 53 U/L   Alkaline Phosphatase 93 39 - 117 U/L   Total Bilirubin 0.4 0.3 - 1.2 mg/dL   GFR calc non Af Amer 19 (L) >90 mL/min   GFR calc Af Amer 22 (L) >90 mL/min    Comment: (NOTE) The eGFR has been calculated using the CKD EPI equation. This calculation has not been validated in all clinical situations. eGFR's persistently <90 mL/min signify possible Chronic Kidney Disease.    Anion gap 15 5 - 15  Lactic acid, plasma     Status: None   Collection Time: 11/15/14 10:37 AM  Result Value Ref Range   Lactic Acid, Venous 1.6 0.5 - 2.2 mmol/L  Troponin I     Status: None   Collection Time: 11/15/14 10:37 AM  Result Value Ref Range   Troponin I <0.30 <0.30 ng/mL    Comment:        Due to the release kinetics of cTnI, a negative result within the first hours of the onset of symptoms does not rule out myocardial infarction with certainty. If myocardial infarction is still suspected, repeat the test at appropriate intervals.   Protime-INR     Status: None   Collection Time: 11/15/14 10:51 AM  Result Value Ref Range   Prothrombin Time 13.9 11.6 - 15.2 seconds   INR 1.06 0.00 - 1.49  APTT     Status: None   Collection Time: 11/15/14 10:51 AM  Result Value Ref Range   aPTT 29 24 - 37 seconds  I-Stat Troponin, ED (not at [facility])      Status: None   Collection Time: 11/15/14 10:58 AM  Result Value Ref Range   Troponin i, poc 0.07 0.00 - 0.08 ng/mL   Comment 3            Comment: Due to the release kinetics of cTnI, a negative result within the first hours of the onset of symptoms does not rule out myocardial infarction with certainty. If myocardial infarction is still suspected, repeat the test at appropriate intervals.   I-Stat Chem 8, ED     Status: Abnormal   Collection Time: 11/15/14 11:00 AM  Result Value Ref Range   Sodium 146 137 - 147 mEq/L   Potassium 3.7 3.7 - 5.3 mEq/L   Chloride 109 96 - 112 mEq/L   BUN 36 (H) 6 - 23 mg/dL   Creatinine, Ser 2.90 (H) 0.50 - 1.35 mg/dL   Glucose, Bld 92 70 - 99 mg/dL   Calcium, Ion 1.05 (L) 1.13 - 1.30 mmol/L   TCO2 22 0 - 100 mmol/L   Hemoglobin 10.5 (L) 13.0 - 17.0 g/dL   HCT 31.0 (L) 39.0 - 52.0 %  Urine rapid drug screen (hosp performed)     Status: Abnormal   Collection Time: 11/15/14 11:50 AM  Result Value Ref Range   Opiates POSITIVE (A) NONE DETECTED   Cocaine NONE DETECTED NONE DETECTED   Benzodiazepines NONE DETECTED NONE DETECTED   Amphetamines NONE DETECTED NONE DETECTED   Tetrahydrocannabinol NONE DETECTED NONE DETECTED   Barbiturates NONE DETECTED NONE DETECTED    Comment:        DRUG SCREEN FOR MEDICAL PURPOSES ONLY.  IF  CONFIRMATION IS NEEDED FOR ANY PURPOSE, NOTIFY LAB WITHIN 5 DAYS.        LOWEST DETECTABLE LIMITS FOR URINE DRUG SCREEN Drug Class       Cutoff (ng/mL) Amphetamine      1000 Barbiturate      200 Benzodiazepine   253 Tricyclics       664 Opiates          300 Cocaine          300 THC              50   Urine culture     Status: None (Preliminary result)   Collection Time: 11/15/14 11:50 AM  Result Value Ref Range   Specimen Description URINE, CLEAN CATCH    Special Requests NONE    Culture  Setup Time      11/15/2014 18:11 Performed at [place]      15,000 COLONIES/ML Performed at  [place] Performed at [facility]    Report Status PENDING   Urinalysis, Routine w reflex microscopic     Status: Abnormal   Collection Time: 11/15/14 11:50 AM  Result Value Ref Range   Color, Urine YELLOW YELLOW   APPearance CLEAR CLEAR   Specific Gravity, Urine 1.025 1.005 - 1.030   pH 6.0 5.0 - 8.0   Glucose, UA NEGATIVE NEGATIVE mg/dL   Hgb urine dipstick MODERATE (A) NEGATIVE   Bilirubin Urine NEGATIVE NEGATIVE   Ketones, ur NEGATIVE NEGATIVE mg/dL   Protein, ur 100 (A) NEGATIVE mg/dL   Urobilinogen, UA 0.2 0.0 - 1.0 mg/dL   Nitrite NEGATIVE NEGATIVE   Leukocytes, UA NEGATIVE NEGATIVE  Urine microscopic-add on     Status: None   Collection Time: 11/15/14 11:50 AM  Result Value Ref Range   RBC / HPF 7-10 <3 RBC/hpf  TSH     Status: None   Collection Time: 11/15/14  7:21 PM  Result Value Ref Range   TSH 3.390 0.350 - 4.500 uIU/mL    Comment: Performed at [facility]  Ammonia     Status: None   Collection Time: 11/15/14  7:55 PM  Result Value Ref Range   Ammonia 28 11 - 60 umol/L  Blood gas, arterial     Status: Abnormal   Collection Time: 11/15/14  8:15 PM  Result Value Ref Range   Delivery systems ROOM AIR    pH, Arterial 7.395 7.350 - 7.450   pCO2 arterial 36.2 35.0 - 45.0 mmHg   pO2, Arterial 74.5 (L) 80.0 - 100.0 mmHg   Bicarbonate 21.7 20.0 - 24.0 mEq/L   TCO2 21.2 0 - 100 mmol/L   Acid-base deficit 2.4 (H) 0.0 - 2.0 mmol/L   O2 Saturation 95.9 %   Patient temperature 37.0    Collection site LEFT RADIAL    Drawn by [id number]    Sample type ARTERIAL    Allens test (pass/fail) PASS PASS  Glucose, capillary     Status: Abnormal   Collection Time: 11/15/14  9:03 PM  Result Value Ref Range   Glucose-Capillary 149 (H) 70 - 99 mg/dL   Comment 1 Notify RN   Comprehensive metabolic panel     Status: Abnormal   Collection Time: 11/16/14  5:21 AM  Result Value Ref Range   Sodium 147 137 - 147 mEq/L    Potassium 4.1 3.7 - 5.3 mEq/L  Chloride 111 96 - 112 mEq/L   CO2 23 19 - 32 mEq/L   Glucose, Bld 71 70 - 99 mg/dL   BUN 33 (H) 6 - 23 mg/dL   Creatinine, Ser 2.68 (H) 0.50 - 1.35 mg/dL   Calcium 7.7 (L) 8.4 - 10.5 mg/dL   Total Protein 7.0 6.0 - 8.3 g/dL   Albumin 3.3 (L) 3.5 - 5.2 g/dL   AST 22 0 - 37 U/L   ALT 14 0 - 53 U/L   Alkaline Phosphatase 78 39 - 117 U/L   Total Bilirubin 0.3 0.3 - 1.2 mg/dL   GFR calc non Af Amer 21 (L) >90 mL/min   GFR calc Af Amer 24 (L) >90 mL/min    Comment: (NOTE) The eGFR has been calculated using the CKD EPI equation. This calculation has not been validated in all clinical situations. eGFR's persistently <90 mL/min signify possible Chronic Kidney Disease.    Anion gap 13 5 - 15  CBC     Status: Abnormal   Collection Time: 11/16/14  5:21 AM  Result Value Ref Range   WBC 5.0 4.0 - 10.5 K/uL   RBC 3.03 (L) 4.22 - 5.81 MIL/uL   Hemoglobin 9.2 (L) 13.0 - 17.0 g/dL   HCT 27.3 (L) 39.0 - 52.0 %   MCV 90.1 78.0 - 100.0 fL   MCH 30.4 26.0 - 34.0 pg   MCHC 33.7 30.0 - 36.0 g/dL   RDW 13.6 11.5 - 15.5 %   Platelets 90 (L) 150 - 400 K/uL    Comment: SPECIMEN CHECKED FOR CLOTS PLATELET COUNT CONFIRMED BY SMEAR   HIV antibody     Status: None   Collection Time: 11/16/14  5:21 AM  Result Value Ref Range   HIV 1&2 Ab, 4th Generation NONREACTIVE NONREACTIVE    Comment: (NOTE) A NONREACTIVE HIV Ag/Ab result does not exclude HIV infection since the time frame for seroconversion is variable. If acute HIV infection is suspected, a HIV-1 RNA Qualitative TMA test is recommended. HIV-1/2 Antibody Diff         Not indicated. HIV-1 RNA, Qual TMA           Not indicated. PLEASE NOTE: This information has been disclosed to you from records whose confidentiality may be protected by state law. If your state requires such protection, then the state law prohibits you from making any further disclosure of the information without the specific written consent of  the person to whom it pertains, or as otherwise permitted by law. A general authorization for the release of medical or other information is NOT sufficient for this purpose. The performance of this assay has not been clinically validated in patients less than 28 years old. Performed at [facility]   RPR     Status: None   Collection Time: 11/16/14  5:21 AM  Result Value Ref Range   RPR NON REAC NON REAC    Comment: Performed at [facility]  Glucose, capillary     Status: Abnormal   Collection Time: 11/16/14  8:07 AM  Result Value Ref Range   Glucose-Capillary 62 (L) 70 - 99 mg/dL  Glucose, capillary     Status: Abnormal   Collection Time: 11/16/14 10:06 AM  Result Value Ref Range   Glucose-Capillary 166 (H) 70 - 99 mg/dL   Comment 1 Notify RN   Glucose, capillary     Status: Abnormal   Collection Time: 11/16/14 11:51 AM  Result Value Ref Range  Glucose-Capillary 141 (H) 70 - 99 mg/dL   Comment 1 Notify RN   Glucose, capillary     Status: Abnormal   Collection Time: 11/16/14  5:23 PM  Result Value Ref Range   Glucose-Capillary 179 (H) 70 - 99 mg/dL   Comment 1 Notify RN   Glucose, capillary     Status: Abnormal   Collection Time: 11/16/14  8:51 PM  Result Value Ref Range   Glucose-Capillary 171 (H) 70 - 99 mg/dL   Comment 1 Documented in Chart    Comment 2 Notify RN     Studies/Results:      A. Merlene Laughter, M.D.  Diplomate, Tax adviser of Psychiatry and Neurology ( Neurology). 11/16/2014, 9:17 PM

## 2014-11-16 NOTE — Progress Notes (Signed)
Nutrition Brief Note  Patient identified on the Malnutrition Screening Tool (MST) Report.  Pt is a very pleasant gentleman who presented to ED with increased confusion. He is surrounded by family today.   Wt Readings from Last 15 Encounters:  11/15/14 142 lb 10.2 oz (64.7 kg)  11/15/14 140 lb (63.504 kg)  10/15/14 140 lb (63.504 kg)  10/13/14 140 lb (63.504 kg)  04/19/14 145 lb (65.772 kg)  05/05/13 158 lb (71.668 kg)    Body mass index is 24.47 kg/(m^2). Patient meets criteria for normal based on current BMI. Pt denies recent changes in weight or appetite. His spouse says, "he ate like someone was going to take it away from him".  Current diet order is Heart Healthy/CHO Modified  patient is consuming approximately >75% of meals at this time. Labs and medications reviewed.   No nutrition interventions warranted at this time. If nutrition issues arise, please consult RD.   Colman Cater MS,RD,CSG,LDN Office: [phone] Pager: [phone]

## 2014-11-16 NOTE — Plan of Care (Signed)
Problem: Phase II Progression Outcomes Goal: Progress activity as tolerated unless otherwise ordered Outcome: Progressing Goal: Vital signs remain stable Outcome: Progressing

## 2014-11-16 NOTE — Progress Notes (Signed)
EEG Completed; Results Pending  

## 2014-11-16 NOTE — Progress Notes (Signed)
UR completed 

## 2014-11-17 DIAGNOSIS — G9340 Encephalopathy, unspecified: Secondary | ICD-10-CM | POA: Insufficient documentation

## 2014-11-17 LAB — URINE CULTURE

## 2014-11-17 LAB — CBC
HCT: 24.7 % — ABNORMAL LOW (ref 39.0–52.0)
Hemoglobin: 8.4 g/dL — ABNORMAL LOW (ref 13.0–17.0)
MCH: 30.5 pg (ref 26.0–34.0)
MCHC: 34 g/dL (ref 30.0–36.0)
MCV: 89.8 fL (ref 78.0–100.0)
Platelets: 92 10*3/uL — ABNORMAL LOW (ref 150–400)
RBC: 2.75 MIL/uL — ABNORMAL LOW (ref 4.22–5.81)
RDW: 13.5 % (ref 11.5–15.5)
WBC: 4.5 10*3/uL (ref 4.0–10.5)

## 2014-11-17 LAB — BASIC METABOLIC PANEL
Anion gap: 14 (ref 5–15)
BUN: 31 mg/dL — ABNORMAL HIGH (ref 6–23)
CO2: 21 mEq/L (ref 19–32)
Calcium: 7.3 mg/dL — ABNORMAL LOW (ref 8.4–10.5)
Chloride: 109 mEq/L (ref 96–112)
Creatinine, Ser: 2.79 mg/dL — ABNORMAL HIGH (ref 0.50–1.35)
GFR, EST AFRICAN AMERICAN: 23 mL/min — AB (ref >90)
GFR, EST NON AFRICAN AMERICAN: 20 mL/min — AB (ref >90)
Glucose, Bld: 176 mg/dL — ABNORMAL HIGH (ref 70–99)
Potassium: 4.7 mEq/L (ref 3.7–5.3)
Sodium: 144 mEq/L (ref 137–147)

## 2014-11-17 LAB — GLUCOSE, CAPILLARY
Glucose-Capillary: 163 mg/dL — ABNORMAL HIGH (ref 70–99)
Glucose-Capillary: 155 mg/dL — ABNORMAL HIGH (ref 70–99)

## 2014-11-17 MED ORDER — HYDRALAZINE HCL 25 MG PO TABS: 25.0000 mg | ORAL_TABLET | Freq: Three times a day (TID) | ORAL | Status: DC

## 2014-11-17 MED ORDER — AMLODIPINE BESYLATE 5 MG PO TABS: 5.0000 mg | ORAL_TABLET | Freq: Every day | ORAL | Status: DC

## 2014-11-17 MED ORDER — INSULIN DETEMIR 100 UNIT/ML ~~LOC~~ SOLN: 25.0000 [IU] | Freq: Every day | SUBCUTANEOUS | Status: DC

## 2014-11-17 NOTE — Progress Notes (Signed)
NURSING PROGRESS NOTE  REINHARDT LICAUSI [id number] Discharge Data: 11/17/2014 4:57 PM Attending Provider: No att. providers found [id number],TAEWY B., MD   Almetta Lovely Deroo to be D/C'd Home per MD order.    All IV's discontinued and monitored for bleeding.  All belongings returned to patient for patient to take home.  AVS summary and prescriptions reviewed with patient.  Patient left floor via wheelchair, escorted by NT.  Last Documented Vital Signs:  Blood pressure 153/68, pulse 82, temperature 98.6 F (37 C), temperature source Oral, resp. rate 20, height 5\' 4"  (1.626 m), weight 70 kg (154 lb 5.2 oz), SpO2 96 %.  Cecilie Kicks D

## 2014-11-17 NOTE — Discharge Summary (Addendum)
Physician Discharge Summary  Douglas [name] PIR:[id number] DOB: February 16, 1931 DOA: 11/15/2014  PCP: [name]., MD  Admit date: 11/15/2014 Discharge date: 11/17/2014  Time spent: 45 minutes  Recommendations for Outpatient Follow-up:  1. Follow up with neurology in 6 weeks 2. Follow up with primary care doctor in 1-2 weeks  Discharge Diagnoses:  Active Problems:   Hypertension   Diabetes   Altered mental state   Confusion   CKD (chronic kidney disease) stage 4, GFR 15-29 ml/min   Anemia, chronic disease   Altered mental status   Encephalopathy   Discharge Condition: improved  Diet recommendation: low salt, low carb  Filed Weights   11/15/14 1421 11/17/14 0629  Weight: 64.7 kg (142 lb 10.2 oz) 70 kg (154 lb 5.2 oz)    History of present illness:  This patient was admitted to the hospital with new onset confusion. He was apparently in his usual state of health when on the morning of admission he woke up with episodes of confusion. These were interspersed between moments of clarity. He did not have any fever, cough, shortness of breath, chest pain, dysuria, vomiting, diarrhea or any other complaints prior to admission. He was admitted to the hospital for further treatments.  Hospital Course:  MRI of the brain was found to be unremarkable. Metabolic workup was also unrevealing. Blood sugar was normal on admission. He was noted to be mildly hypertensive on arrival to the emergency room, but his wife reports that his blood pressure was normal at home. His wife is adamant that he he did not take any extra/new pain medications. He was seen by neurology who ordered EEG was obtained which was also unrevealing. The exact etiologies of his encephalopathy was not entirely clear. His hospital course he did begin to improve. He is able to carry on a conversation without significant difficulty. Per neurology, complex partial seizures are still a possibility. Medication effect is always a  possibility. Recommendations were for follow-up with neurology in the next 6 weeks.  His renal function appears to be at baseline. He does have chronic kidney disease.  Hemoglobin is also near baseline. He does have a mild decrease in hemoglobin due to IV fluids. He does not have any evidence of bleeding. This can be further followed in the outpatient setting  He was noted to be significantly hypertensive and was started on Norvasc as well as hydralazine. This can be further titrated in the outpatient setting.  He is noted to be thrombocytopenic, but this dates back to 03/2014. Further workup/follow-up as an outpatient   Procedures:  EEG: unremarkable  Consultations:  Neurology  Discharge Exam: Filed Vitals:   11/17/14 1202  BP: 153/68  Pulse:   Temp:   Resp:     General: NAD Cardiovascular: S1, S2 RRR Respiratory: CTA B  Discharge Instructions You were cared for by a hospitalist during your hospital stay. If you have any questions about your discharge medications or the care you received while you were in the hospital after you are discharged, you can call the unit and asked to speak with the hospitalist on call if the hospitalist that took care of you is not available. Once you are discharged, your primary care physician will handle any further medical issues. Please note that NO REFILLS for any discharge medications will be authorized once you are discharged, as it is imperative that you return to your primary care physician (or establish a relationship with a primary care physician if you do not have  one) for your aftercare needs so that they can reassess your need for medications and monitor your lab values.  Discharge Instructions    Diet - low sodium heart healthy    Complete by:  As directed      Increase activity slowly    Complete by:  As directed           Current Discharge Medication List    START taking these medications   Details  amLODipine (NORVASC) 5 MG  tablet Take 1 tablet (5 mg total) by mouth daily. Qty: 30 tablet, Refills: 1    hydrALAZINE (APRESOLINE) 25 MG tablet Take 1 tablet (25 mg total) by mouth 3 (three) times daily. Qty: 90 tablet, Refills: 1      CONTINUE these medications which have CHANGED   Details  insulin detemir (LEVEMIR) 100 UNIT/ML injection Inject 0.25 mLs (25 Units total) into the skin at bedtime. Qty: 10 mL, Refills: 11      CONTINUE these medications which have NOT CHANGED   Details  atorvastatin (LIPITOR) 40 MG tablet Take 40 mg by mouth daily.    gabapentin (NEURONTIN) 300 MG capsule Take 600 mg by mouth at bedtime.     Insulin Aspart (NOVOLOG [place]) Inject 12-15 Units into the skin 3 (three) times daily. BASED ON SLIDING SCALE.    ketorolac (ACULAR) 0.5 % ophthalmic solution Place 1 drop into both eyes 3 (three) times daily.    omeprazole (PRILOSEC) 20 MG capsule Take 20 mg by mouth daily.      STOP taking these medications     acetaminophen-codeine (TYLENOL #3) 300-30 MG per tablet      HYDROcodone-acetaminophen (NORCO/VICODIN) 5-325 MG per tablet      HYDROcodone-acetaminophen (NORCO/VICODIN) 5-325 MG per tablet        No Known Allergies Follow-up Information    Follow up with [facility], KOFI, MD. Schedule an appointment as soon as possible for a visit in 6 weeks.   Specialty:  Neurology   Contact information:   [address] [place] [address] [phone]        The results of significant diagnostics from this hospitalization (including imaging, microbiology, ancillary and laboratory) are listed below for reference.    Significant Diagnostic Studies: Dg Chest 2 View  11/15/2014   CLINICAL DATA:  Mental status change since this morning; history of diabetes and hypertension  EXAM: CHEST  2 VIEW  COMPARISON:  Uppermost images from an abdominal CT scan of September 18, 2013 and PA and lateral chest x-ray of September 29, 2010.  FINDINGS: There is chronic mild elevation of the right  hemidiaphragm. There is linear density posteriorly in both lower lobes unchanged since September 2014 but new since October of 2011. The heart and pulmonary vascularity are within the limits of normal. There is no pleural effusion or pneumothorax. The trachea is midline. The bony thorax is unremarkable.  IMPRESSION: There is bibasilar scarring posteriorly. There is no evidence of pneumonia nor CHF nor other acute cardiopulmonary abnormality.   Electronically Signed   By: David  Martinique   On: 11/15/2014 11:27   Ct Head Wo Contrast  11/15/2014   CLINICAL DATA:  Altered mental status.  Dizziness.  ICD10: R 42.  EXAM: CT HEAD WITHOUT CONTRAST  TECHNIQUE: Contiguous axial images were obtained from the base of the skull through the vertex without intravenous contrast.  COMPARISON:  Neck CT 04/20/2014.  FINDINGS: Sinuses/Soft tissues: Surgical changes about the globes. Clear mastoid air cells.  Intracranial: Moderate  low density in the periventricular white matter likely related to small vessel disease. No mass lesion, hemorrhage, hydrocephalus, acute infarct, intra-axial, or extra-axial fluid collection.  IMPRESSION: 1.  No acute intracranial abnormality. 2. Small vessel ischemic change in the periventricular white matter.   Electronically Signed   By: Abigail Miyamoto M.D.   On: 11/15/2014 11:41   Mr Brain Wo Contrast (neuro Protocol)  11/15/2014   CLINICAL DATA:  80 year old male with acute confusion, altered mental status. Initial encounter.  EXAM: MRI HEAD WITHOUT CONTRAST  TECHNIQUE: Multiplanar, multiecho pulse sequences of the brain and surrounding structures were obtained without intravenous contrast.  COMPARISON:  Head CT without contrast 1114 hr the same day and earlier.  FINDINGS: Cerebral volume is within normal limits for age. No restricted diffusion to suggest acute infarction. No midline shift, mass effect, evidence of mass lesion, ventriculomegaly, extra-axial collection or acute intracranial  hemorrhage. Cervicomedullary junction and pituitary are within normal limits. Negative for age visualized cervical spine. Normal bone marrow signal. Major intracranial vascular flow voids are preserved.  Confluent mostly periventricular white matter T2 and FLAIR hyperintensity. No cortical encephalomalacia. Pearline Cables and white matter signal otherwise within normal limits for age.  Visible internal auditory structures appear normal. Postoperative changes to the globes. Visualized paranasal sinuses and mastoids are clear. Visualized scalp soft tissues are within normal limits.  IMPRESSION: No acute intracranial abnormality.  Mild to moderate for age cerebral white matter signal changes, most commonly due to chronic small vessel disease.   Electronically Signed   By: Lars Pinks M.D.   On: 11/15/2014 11:52    Microbiology: Recent Results (from the past 240 hour(s))  Urine culture     Status: None (Preliminary result)   Collection Time: 11/15/14 11:50 AM  Result Value Ref Range Status   Specimen Description URINE, CLEAN CATCH  Final   Special Requests NONE  Final   Culture  Setup Time   Final    11/15/2014 18:11 Performed at [facility]   Final    15,000 COLONIES/ML Performed at [facility]    Culture   Final    [place] Performed at [facility]    Report Status PENDING  Incomplete     Labs: Basic Metabolic Panel:  Recent Labs Lab 11/15/14 1037 11/15/14 1100 11/16/14 0521 11/17/14 0549  NA 147 146 147 144  K 3.8 3.7 4.1 4.7  CL 108 109 111 109  CO2 24  --  23 21  GLUCOSE 95 92 71 176*  BUN 39* 36* 33* 31*  CREATININE 2.88* 2.90* 2.68* 2.79*  CALCIUM 8.1*  --  7.7* 7.3*   Liver Function Tests:  Recent Labs Lab 11/15/14 1037 11/16/14 0521  AST 24 22  ALT 18 14  ALKPHOS 93 78  BILITOT 0.4 0.3  PROT 7.7 7.0  ALBUMIN 3.9 3.3*   No results for input(s): LIPASE, AMYLASE in the last 168 hours.  Recent Labs Lab 11/15/14 1955   AMMONIA 28   CBC:  Recent Labs Lab 11/15/14 1037 11/15/14 1100 11/16/14 0521 11/17/14 0549  WBC 6.0  --  5.0 4.5  NEUTROABS 3.6  --   --   --   HGB 9.9* 10.5* 9.2* 8.4*  HCT 28.3* 31.0* 27.3* 24.7*  MCV 88.7  --  90.1 89.8  PLT 96*  --  90* 92*   Cardiac Enzymes:  Recent Labs Lab 11/15/14 1037  TROPONINI <0.30   BNP: BNP (last 3  results) No results for input(s): PROBNP in the last 8760 hours. CBG:  Recent Labs Lab 11/16/14 1151 11/16/14 1723 11/16/14 2051 11/17/14 0820 11/17/14 1200  GLUCAP 141* 179* 171* 163* 155*       Signed:  Vance Belcourt  [name] 11/17/2014, 2:42 PM

## 2014-11-17 NOTE — Plan of Care (Signed)
Problem: Consults Goal: General Medical Patient Education See Patient Education Module for specific education.  Outcome: Completed/Met Date Met:  11/17/14 Goal: Skin Care Protocol Initiated - if Braden Score 18 or less If consults are not indicated, leave blank or document N/A  Outcome: Not Applicable Date Met:  54/65/03 Goal: Nutrition Consult-if indicated Outcome: Not Applicable Date Met:  54/65/68 Goal: Diabetes Guidelines if Diabetic/Glucose > 140 If diabetic or lab glucose is > 140 mg/dl - Initiate Diabetes/Hyperglycemia Guidelines & Document Interventions  Outcome: Completed/Met Date Met:  11/17/14  Problem: Phase I Progression Outcomes Goal: OOB as tolerated unless otherwise ordered Outcome: Completed/Met Date Met:  11/17/14 Goal: Initial discharge plan identified Outcome: Completed/Met Date Met:  11/17/14  Problem: Phase II Progression Outcomes Goal: Progress activity as tolerated unless otherwise ordered Outcome: Completed/Met Date Met:  11/17/14

## 2014-11-17 NOTE — Procedures (Signed)
  Douglas A. Merlene Laughter, MD     [email or web address]           HISTORY: The patient presents with confusion and altered mental status lasting for 5 hours suspicious for complex partial seizures.  MEDICATIONS: Scheduled Meds: . amLODipine  5 mg Oral Daily  . atorvastatin  40 mg Oral q1800  . enoxaparin (LOVENOX) injection  30 mg Subcutaneous Q24H  . gabapentin  600 mg Oral QHS  . insulin aspart  0-15 Units Subcutaneous TID WC  . insulin aspart  0-5 Units Subcutaneous QHS  . insulin detemir  20 Units Subcutaneous QHS  . ketorolac  1 drop Both Eyes TID  . pantoprazole  40 mg Oral Daily  . sodium chloride  3 mL Intravenous Q12H   Continuous Infusions: . sodium chloride 100 mL/hr at 11/17/14 0054   PRN Meds:.acetaminophen **OR** acetaminophen, ondansetron **OR** ondansetron (ZOFRAN) IV  Prior to Admission medications   Medication Sig Start Date End Date Taking? Authorizing Provider  acetaminophen-codeine (TYLENOL #3) 300-30 MG per tablet Take 1 tablet by mouth every 4 (four) hours as needed for moderate pain. 10/15/14  Yes Carole Civil, MD  atorvastatin (LIPITOR) 40 MG tablet Take 40 mg by mouth daily.   Yes Historical Provider, MD  gabapentin (NEURONTIN) 300 MG capsule Take 600 mg by mouth at bedtime.    Yes Historical Provider, MD  Insulin Aspart (NOVOLOG [place]) Inject 12-15 Units into the skin 3 (three) times daily. BASED ON SLIDING SCALE.   Yes Historical Provider, MD  insulin detemir (LEVEMIR) 100 UNIT/ML injection Inject 30 Units into the skin at bedtime.    Yes Historical Provider, MD  ketorolac (ACULAR) 0.5 % ophthalmic solution Place 1 drop into both eyes 3 (three) times daily.   Yes Historical Provider, MD  omeprazole (PRILOSEC) 20 MG capsule Take 20 mg by mouth daily.   Yes Historical Provider, MD  HYDROcodone-acetaminophen (NORCO/VICODIN) 5-325 MG per tablet Take 0.5 tablets by mouth every 4 (four) hours as needed for moderate pain. Patient not  taking: Reported on 11/15/2014 10/13/14   Orpah Greek, MD  HYDROcodone-acetaminophen (NORCO/VICODIN) 5-325 MG per tablet Take 0.5 tablets by mouth every 4 (four) hours as needed. Patient not taking: Reported on 11/15/2014 10/13/14   Orpah Greek, MD      ANALYSIS: A 16 channel recording using standard 10 20 measurements is conducted for 20 minutes. There is a well-formed posterior dominant rhythm of 9 Hz which attenuates with eye opening. There is beta activity observed frontal areas. Awake and drowsy activities are observed. Photic stimulation and hyperventilation were not carried out. There is no focal or lateral slowing. There is no epileptiform activity observed.   IMPRESSION: Normal study.      Jovanka Westgate A. Merlene [name], M.D.  Diplomate, Tax adviser of Psychiatry and Neurology ( Neurology).

## 2014-11-19 ENCOUNTER — Encounter (INDEPENDENT_AMBULATORY_CARE_PROVIDER_SITE_OTHER): Payer: Medicare Other | Admitting: Ophthalmology

## 2014-11-26 ENCOUNTER — Encounter (INDEPENDENT_AMBULATORY_CARE_PROVIDER_SITE_OTHER): Payer: Medicare Other | Admitting: Ophthalmology

## 2014-11-26 DIAGNOSIS — I10 Essential (primary) hypertension: Secondary | ICD-10-CM

## 2014-11-26 DIAGNOSIS — H35371 Puckering of macula, right eye: Secondary | ICD-10-CM

## 2014-11-26 DIAGNOSIS — E11311 Type 2 diabetes mellitus with unspecified diabetic retinopathy with macular edema: Secondary | ICD-10-CM

## 2014-11-26 DIAGNOSIS — H318 Other specified disorders of choroid: Secondary | ICD-10-CM

## 2014-11-26 DIAGNOSIS — E11331 Type 2 diabetes mellitus with moderate nonproliferative diabetic retinopathy with macular edema: Secondary | ICD-10-CM

## 2014-11-26 DIAGNOSIS — H43813 Vitreous degeneration, bilateral: Secondary | ICD-10-CM

## 2014-11-26 DIAGNOSIS — H35033 Hypertensive retinopathy, bilateral: Secondary | ICD-10-CM

## 2014-11-27 ENCOUNTER — Encounter: Payer: Self-pay | Admitting: Orthopedic Surgery

## 2014-11-27 ENCOUNTER — Ambulatory Visit (INDEPENDENT_AMBULATORY_CARE_PROVIDER_SITE_OTHER): Payer: Self-pay | Admitting: Orthopedic Surgery

## 2014-11-27 ENCOUNTER — Ambulatory Visit (INDEPENDENT_AMBULATORY_CARE_PROVIDER_SITE_OTHER): Payer: Medicare Other

## 2014-11-27 VITALS — BP 164/74 | Ht 64.0 in | Wt 154.3 lb

## 2014-11-27 DIAGNOSIS — S82892A Other fracture of left lower leg, initial encounter for closed fracture: Secondary | ICD-10-CM

## 2014-11-27 DIAGNOSIS — S82842D Displaced bimalleolar fracture of left lower leg, subsequent encounter for closed fracture with routine healing: Secondary | ICD-10-CM

## 2014-11-27 NOTE — Patient Instructions (Signed)
You can now do activities as tolerated  You may have some soreness and discomfort for 1-2 weeks  After that he should be able to do all the things she did before your injury

## 2014-11-27 NOTE — Progress Notes (Signed)
Patient ID: Douglas [name], male   DOB: Jul 03, 1931, 80 y.o.   MRN: [id number] Chief Complaint  Patient presents with  . Follow-up    6 week recheck left ankle fracture + xray    Patient fell in the yard October 16 injured his medial malleolus treated with a Cam Walker. Doing well. X-ray shows avulsed fragment no displacement  Patients clinical exam reveals no tenderness normal alignment and normal range of motion.   Encounter Diagnoses  Name Primary?  Marland Kitchen Ankle fracture, left   . Ankle fracture, bimalleolar, closed, left, with routine healing, subsequent encounter Yes    He may resume normal activity

## 2014-12-10 ENCOUNTER — Ambulatory Visit (INDEPENDENT_AMBULATORY_CARE_PROVIDER_SITE_OTHER): Payer: Medicare Other | Admitting: Ophthalmology

## 2014-12-10 DIAGNOSIS — H318 Other specified disorders of choroid: Secondary | ICD-10-CM

## 2015-01-03 ENCOUNTER — Encounter (INDEPENDENT_AMBULATORY_CARE_PROVIDER_SITE_OTHER): Payer: Medicare Other | Admitting: Ophthalmology

## 2015-01-03 DIAGNOSIS — E11311 Type 2 diabetes mellitus with unspecified diabetic retinopathy with macular edema: Secondary | ICD-10-CM

## 2015-01-03 DIAGNOSIS — H318 Other specified disorders of choroid: Secondary | ICD-10-CM

## 2015-01-03 DIAGNOSIS — H35033 Hypertensive retinopathy, bilateral: Secondary | ICD-10-CM

## 2015-01-03 DIAGNOSIS — I10 Essential (primary) hypertension: Secondary | ICD-10-CM

## 2015-01-03 DIAGNOSIS — H43813 Vitreous degeneration, bilateral: Secondary | ICD-10-CM

## 2015-01-03 DIAGNOSIS — E11331 Type 2 diabetes mellitus with moderate nonproliferative diabetic retinopathy with macular edema: Secondary | ICD-10-CM

## 2015-01-04 ENCOUNTER — Ambulatory Visit (INDEPENDENT_AMBULATORY_CARE_PROVIDER_SITE_OTHER): Payer: Medicare Other | Admitting: Urology

## 2015-01-04 DIAGNOSIS — N3941 Urge incontinence: Secondary | ICD-10-CM

## 2015-01-04 DIAGNOSIS — E291 Testicular hypofunction: Secondary | ICD-10-CM

## 2015-01-04 DIAGNOSIS — Z87448 Personal history of other diseases of urinary system: Secondary | ICD-10-CM

## 2015-01-04 DIAGNOSIS — C61 Malignant neoplasm of prostate: Secondary | ICD-10-CM

## 2015-01-04 DIAGNOSIS — C969 Malignant neoplasm of lymphoid, hematopoietic and related tissue, unspecified: Secondary | ICD-10-CM

## 2015-01-17 ENCOUNTER — Inpatient Hospital Stay (HOSPITAL_COMMUNITY)
Admission: EM | Admit: 2015-01-17 | Discharge: 2015-01-21 | DRG: 481 | Disposition: A | Discharge status: Skilled Nursing Facility | Payer: Medicare Other | Attending: Internal Medicine | Admitting: Internal Medicine

## 2015-01-17 ENCOUNTER — Emergency Department (HOSPITAL_COMMUNITY): Payer: Medicare Other

## 2015-01-17 ENCOUNTER — Encounter (HOSPITAL_COMMUNITY): Payer: Self-pay | Admitting: Cardiology

## 2015-01-17 DIAGNOSIS — E119 Type 2 diabetes mellitus without complications: Secondary | ICD-10-CM

## 2015-01-17 DIAGNOSIS — I129 Hypertensive chronic kidney disease with stage 1 through stage 4 chronic kidney disease, or unspecified chronic kidney disease: Secondary | ICD-10-CM | POA: Diagnosis present

## 2015-01-17 DIAGNOSIS — E875 Hyperkalemia: Secondary | ICD-10-CM | POA: Diagnosis not present

## 2015-01-17 DIAGNOSIS — H905 Unspecified sensorineural hearing loss: Secondary | ICD-10-CM | POA: Diagnosis not present

## 2015-01-17 DIAGNOSIS — W108XXA Fall (on) (from) other stairs and steps, initial encounter: Secondary | ICD-10-CM | POA: Diagnosis not present

## 2015-01-17 DIAGNOSIS — D631 Anemia in chronic kidney disease: Secondary | ICD-10-CM | POA: Diagnosis not present

## 2015-01-17 DIAGNOSIS — Z794 Long term (current) use of insulin: Secondary | ICD-10-CM | POA: Diagnosis not present

## 2015-01-17 DIAGNOSIS — E785 Hyperlipidemia, unspecified: Secondary | ICD-10-CM | POA: Diagnosis present

## 2015-01-17 DIAGNOSIS — N178 Other acute kidney failure: Secondary | ICD-10-CM | POA: Diagnosis not present

## 2015-01-17 DIAGNOSIS — S72409A Unspecified fracture of lower end of unspecified femur, initial encounter for closed fracture: Secondary | ICD-10-CM | POA: Diagnosis present

## 2015-01-17 DIAGNOSIS — M25561 Pain in right knee: Secondary | ICD-10-CM | POA: Diagnosis present

## 2015-01-17 DIAGNOSIS — S72401A Unspecified fracture of lower end of right femur, initial encounter for closed fracture: Secondary | ICD-10-CM

## 2015-01-17 DIAGNOSIS — D62 Acute posthemorrhagic anemia: Secondary | ICD-10-CM | POA: Diagnosis not present

## 2015-01-17 DIAGNOSIS — R42 Dizziness and giddiness: Secondary | ICD-10-CM | POA: Diagnosis not present

## 2015-01-17 DIAGNOSIS — Z01811 Encounter for preprocedural respiratory examination: Secondary | ICD-10-CM

## 2015-01-17 DIAGNOSIS — D696 Thrombocytopenia, unspecified: Secondary | ICD-10-CM | POA: Diagnosis not present

## 2015-01-17 DIAGNOSIS — Z419 Encounter for procedure for purposes other than remedying health state, unspecified: Secondary | ICD-10-CM

## 2015-01-17 DIAGNOSIS — D638 Anemia in other chronic diseases classified elsewhere: Secondary | ICD-10-CM

## 2015-01-17 DIAGNOSIS — N179 Acute kidney failure, unspecified: Secondary | ICD-10-CM | POA: Diagnosis present

## 2015-01-17 DIAGNOSIS — W19XXXS Unspecified fall, sequela: Secondary | ICD-10-CM

## 2015-01-17 DIAGNOSIS — Y92018 Other place in single-family (private) house as the place of occurrence of the external cause: Secondary | ICD-10-CM | POA: Diagnosis not present

## 2015-01-17 DIAGNOSIS — N184 Chronic kidney disease, stage 4 (severe): Secondary | ICD-10-CM

## 2015-01-17 DIAGNOSIS — Z79899 Other long term (current) drug therapy: Secondary | ICD-10-CM

## 2015-01-17 DIAGNOSIS — S72451A Displaced supracondylar fracture without intracondylar extension of lower end of right femur, initial encounter for closed fracture: Principal | ICD-10-CM | POA: Diagnosis present

## 2015-01-17 DIAGNOSIS — I10 Essential (primary) hypertension: Secondary | ICD-10-CM | POA: Diagnosis present

## 2015-01-17 DIAGNOSIS — W19XXXA Unspecified fall, initial encounter: Secondary | ICD-10-CM | POA: Insufficient documentation

## 2015-01-17 HISTORY — DX: Myoneural disorder, unspecified: G70.9

## 2015-01-17 LAB — CBC WITH DIFFERENTIAL/PLATELET
Basophils Absolute: 0.1 10*3/uL (ref 0.0–0.1)
Basophils Relative: 1 % (ref 0–1)
Eosinophils Absolute: 0.3 10*3/uL (ref 0.0–0.7)
Eosinophils Relative: 7 % — ABNORMAL HIGH (ref 0–5)
HCT: 28.4 % — ABNORMAL LOW (ref 39.0–52.0)
Hemoglobin: 9.8 g/dL — ABNORMAL LOW (ref 13.0–17.0)
Lymphocytes Relative: 36 % (ref 12–46)
Lymphs Abs: 1.5 10*3/uL (ref 0.7–4.0)
MCH: 30.8 pg (ref 26.0–34.0)
MCHC: 34.5 g/dL (ref 30.0–36.0)
MCV: 89.3 fL (ref 78.0–100.0)
Monocytes Absolute: 0.3 10*3/uL (ref 0.1–1.0)
Monocytes Relative: 7 % (ref 3–12)
Neutro Abs: 2 10*3/uL (ref 1.7–7.7)
Neutrophils Relative %: 49 % (ref 43–77)
Platelets: 91 10*3/uL — ABNORMAL LOW (ref 150–400)
RBC: 3.18 MIL/uL — ABNORMAL LOW (ref 4.22–5.81)
RDW: 13.4 % (ref 11.5–15.5)
WBC: 4.2 10*3/uL (ref 4.0–10.5)

## 2015-01-17 LAB — URINALYSIS, ROUTINE W REFLEX MICROSCOPIC
Bilirubin Urine: NEGATIVE
Glucose, UA: 250 mg/dL — AB
Ketones, ur: NEGATIVE mg/dL
Leukocytes, UA: NEGATIVE
Nitrite: NEGATIVE
Protein, ur: 100 mg/dL — AB
Specific Gravity, Urine: 1.025 (ref 1.005–1.030)
Urobilinogen, UA: 0.2 mg/dL (ref 0.0–1.0)
pH: 5.5 (ref 5.0–8.0)

## 2015-01-17 LAB — BASIC METABOLIC PANEL
Anion gap: 8 (ref 5–15)
BUN: 35 mg/dL — ABNORMAL HIGH (ref 6–23)
CO2: 22 mmol/L (ref 19–32)
Calcium: 8.4 mg/dL (ref 8.4–10.5)
Chloride: 109 mEq/L (ref 96–112)
Creatinine, Ser: 2.37 mg/dL — ABNORMAL HIGH (ref 0.50–1.35)
GFR calc Af Amer: 28 mL/min — ABNORMAL LOW (ref >90)
GFR calc non Af Amer: 24 mL/min — ABNORMAL LOW (ref >90)
Glucose, Bld: 214 mg/dL — ABNORMAL HIGH (ref 70–99)
Potassium: 4.9 mmol/L (ref 3.5–5.1)
Sodium: 139 mmol/L (ref 135–145)

## 2015-01-17 LAB — URINE MICROSCOPIC-ADD ON

## 2015-01-17 LAB — GLUCOSE, CAPILLARY: Glucose-Capillary: 163 mg/dL — ABNORMAL HIGH (ref 70–99)

## 2015-01-17 MED ORDER — ONDANSETRON HCL 4 MG/2ML IJ SOLN
4.0000 mg | Freq: Once | INTRAMUSCULAR | Status: AC
  Administered 2015-01-17: 4 mg via INTRAMUSCULAR
  Filled 2015-01-17: qty 2

## 2015-01-17 MED ORDER — DIPHENHYDRAMINE HCL 50 MG/ML IJ SOLN: 25.0000 mg | Freq: Four times a day (QID) | INTRAMUSCULAR | Status: DC | PRN

## 2015-01-17 MED ORDER — MORPHINE SULFATE 4 MG/ML IJ SOLN
6.0000 mg | Freq: Once | INTRAMUSCULAR | Status: AC
  Administered 2015-01-17: 6 mg via INTRAVENOUS

## 2015-01-17 MED ORDER — MORPHINE SULFATE 4 MG/ML IJ SOLN
INTRAMUSCULAR | Status: AC
  Filled 2015-01-17: qty 2

## 2015-01-17 MED ORDER — DIPHENHYDRAMINE HCL 50 MG/ML IJ SOLN
12.5000 mg | Freq: Once | INTRAMUSCULAR | Status: AC
  Administered 2015-01-17: 12.5 mg via INTRAVENOUS
  Filled 2015-01-17: qty 1

## 2015-01-17 MED ORDER — INSULIN ASPART 100 UNIT/ML ~~LOC~~ SOLN
0.0000 [IU] | SUBCUTANEOUS | Status: DC
  Administered 2015-01-18: 2 [IU] via SUBCUTANEOUS
  Administered 2015-01-18: 9 [IU] via SUBCUTANEOUS
  Administered 2015-01-18: 1 [IU] via SUBCUTANEOUS
  Administered 2015-01-19 (×2): 9 [IU] via SUBCUTANEOUS

## 2015-01-17 MED ORDER — ONDANSETRON HCL 4 MG/2ML IJ SOLN: 4.0000 mg | Freq: Three times a day (TID) | INTRAMUSCULAR | Status: AC | PRN

## 2015-01-17 MED ORDER — HYDROMORPHONE HCL 1 MG/ML IJ SOLN: 0.5000 mg | INTRAMUSCULAR | Status: AC | PRN

## 2015-01-17 MED ORDER — MORPHINE SULFATE 4 MG/ML IJ SOLN
6.0000 mg | Freq: Once | INTRAMUSCULAR | Status: AC
Start: 2015-01-17 — End: 2015-01-17
  Administered 2015-01-17: 6 mg via INTRAVENOUS
  Filled 2015-01-17: qty 2

## 2015-01-17 MED ORDER — METHOCARBAMOL 500 MG PO TABS
500.0000 mg | ORAL_TABLET | Freq: Four times a day (QID) | ORAL | Status: DC | PRN
  Administered 2015-01-17: 500 mg via ORAL
  Filled 2015-01-17: qty 1

## 2015-01-17 MED ORDER — HYDROCODONE-ACETAMINOPHEN 5-325 MG PO TABS
1.0000 | ORAL_TABLET | Freq: Four times a day (QID) | ORAL | Status: DC | PRN
  Administered 2015-01-17: 2 via ORAL
  Filled 2015-01-17: qty 1
  Filled 2015-01-17: qty 2

## 2015-01-17 MED ORDER — HYDRALAZINE HCL 20 MG/ML IJ SOLN: 10.0000 mg | Freq: Four times a day (QID) | INTRAMUSCULAR | Status: DC | PRN

## 2015-01-17 MED ORDER — HYDROMORPHONE HCL 1 MG/ML IJ SOLN: 1.0000 mg | INTRAMUSCULAR | Status: DC | PRN

## 2015-01-17 MED ORDER — METHOCARBAMOL 1000 MG/10ML IJ SOLN
500.0000 mg | Freq: Four times a day (QID) | INTRAVENOUS | Status: DC | PRN
  Filled 2015-01-17: qty 5

## 2015-01-17 NOTE — Consult Note (Signed)
I have reviewed his films and have agreed to accept orthopedic care of this injury  Tentative plan is for ORIF of his fracture tomorrrow (friday) afternoon. Please keep him NPO after midnight and hold am anticoagulation   Edmonia Lynch, D Cell: [phone]

## 2015-01-17 NOTE — ED Provider Notes (Signed)
CSN: [id number]     Arrival date & time 01/17/15  1711 History  This chart was scribed for Douglas Manifold, MD by Molli Posey, ED Scribe. This patient was seen in room APA02/APA02 and the patient's care was started 5:58 PM.    Chief Complaint  Patient presents with  . Fall   The history is provided by the patient. No language interpreter was used.   HPI Comments: Douglas [name] is a 81 y.o. male with a history of DM, HTN, hyperlipidemia and cancer who presents to the Emergency Department for a fall that occurred PTA. Pt states he was walking down the steps and his left leg gave out. Knee did not hurt until he struck the ground though. Pt complains of right knee pain at this time. He reports no pain in any other locations. His wife reports that pt has neuropathy in his feet from his DM. He states he takes no blood thinning medication. Pt reports the last time he ate or drank was around 1PM today. He denies numbness or tingling.   PCP Dr. Woody Seller   Past Medical History  Diagnosis Date  . Cancer   . Hypertension   . Diabetes mellitus   . Hyperlipidemia   . Cataracts, bilateral   . Hyperkalemia   . UTI (lower urinary tract infection)   . Calculus of kidney   . Sensorineural hearing loss, unspecified   . Epiglottitis   . Chronic ankle pain    History reviewed. No pertinent past surgical history. History reviewed. No pertinent family history. History  Substance Use Topics  . Smoking status: Never Smoker   . Smokeless tobacco: Not on file  . Alcohol Use: No    Review of Systems  Musculoskeletal: Positive for arthralgias.  All other systems reviewed and are negative.   Allergies  Review of patient's allergies indicates no known allergies.  Home Medications   Prior to Admission medications   Medication Sig Start Date End Date Taking? Authorizing Provider  amLODipine (NORVASC) 5 MG tablet Take 1 tablet (5 mg total) by mouth daily. 11/17/14   Kathie Dike, MD  atorvastatin  (LIPITOR) 40 MG tablet Take 40 mg by mouth daily.    Historical Provider, MD  gabapentin (NEURONTIN) 300 MG capsule Take 600 mg by mouth at bedtime.     Historical Provider, MD  hydrALAZINE (APRESOLINE) 25 MG tablet Take 1 tablet (25 mg total) by mouth 3 (three) times daily. 11/17/14   Kathie Dike, MD  Insulin Aspart (NOVOLOG [place]) Inject 12-15 Units into the skin 3 (three) times daily. BASED ON SLIDING SCALE.    Historical Provider, MD  insulin detemir (LEVEMIR) 100 UNIT/ML injection Inject 0.25 mLs (25 Units total) into the skin at bedtime. 11/17/14   Kathie Dike, MD  ketorolac (ACULAR) 0.5 % ophthalmic solution Place 1 drop into both eyes 3 (three) times daily.    Historical Provider, MD  omeprazole (PRILOSEC) 20 MG capsule Take 20 mg by mouth daily.    Historical Provider, MD   BP 218/98 mmHg  Pulse 74  Temp(Src) 97.8 F (36.6 C) (Oral)  Resp 16  Ht 5\' 3"  (1.6 m)  Wt 144 lb (65.318 kg)  BMI 25.51 kg/m2  SpO2 95% Physical Exam  Constitutional: He appears well-developed and well-nourished. No distress.  HENT:  Head: Normocephalic and atraumatic.  Right Ear: External ear normal.  Left Ear: External ear normal.  Eyes: Conjunctivae are normal. Right eye exhibits no discharge. Left eye exhibits no discharge. No scleral  icterus.  Neck: Neck supple. No tracheal deviation present.  Cardiovascular: Normal rate, regular rhythm and intact distal pulses.   Pulmonary/Chest: Effort normal and breath sounds normal. No stridor. No respiratory distress. He has no wheezes. He has no rales.  Abdominal: Soft. Bowel sounds are normal. He exhibits no distension. There is no tenderness. There is no rebound and no guarding.  Musculoskeletal: He exhibits no edema or tenderness.  Deformity to distal right femur. Did not attempt to range. Closed injury. NVI distally.   Neurological: He is alert. He has normal strength. No cranial nerve deficit (no facial droop, extraocular movements intact, no slurred  speech) or sensory deficit. He exhibits normal muscle tone. He displays no seizure activity. Coordination normal.  Skin: Skin is warm and dry. No rash noted.  Psychiatric: He has a normal mood and affect.  Nursing note and vitals reviewed.   ED Course  Procedures   DIAGNOSTIC STUDIES: Oxygen Saturation is 95% on RA, normal by my interpretation.    COORDINATION OF CARE: 6:04 PM Discussed treatment plan with pt at bedside and pt agreed to plan.   Labs Review Labs Reviewed  CBC WITH DIFFERENTIAL - Abnormal; Notable for the following:    RBC 3.18 (*)    Hemoglobin 9.8 (*)    HCT 28.4 (*)    Platelets 91 (*)    Eosinophils Relative 7 (*)    All other components within normal limits  BASIC METABOLIC PANEL - Abnormal; Notable for the following:    Glucose, Bld 214 (*)    BUN 35 (*)    Creatinine, Ser 2.37 (*)    GFR calc non Af Amer 24 (*)    GFR calc Af Amer 28 (*)    All other components within normal limits  URINALYSIS, ROUTINE W REFLEX MICROSCOPIC - Abnormal; Notable for the following:    Color, Urine STRAW (*)    APPearance HAZY (*)    Glucose, UA 250 (*)    Hgb urine dipstick MODERATE (*)    Protein, ur 100 (*)    All other components within normal limits  URINE MICROSCOPIC-ADD ON - Abnormal; Notable for the following:    Squamous Epithelial / LPF FEW (*)    All other components within normal limits    Imaging Review Dg Chest Portable 1 View  01/17/2015   CLINICAL DATA:  Pre-OP Rt knee FX. No current chest complaints. Hx HTN, diabetes, CA, nonsmoker  EXAM: PORTABLE CHEST - 1 VIEW  COMPARISON:  11/15/2014  FINDINGS: Low lung volumes. There is also elevation the right hemidiaphragm. Mild thinning and lung base opacity noted, greater on the right, most likely atelectasis. No convincing pneumonia. No pulmonary edema. No pleural effusion or pneumothorax.  Cardiac silhouette is normal in size and configuration. Normal mediastinal and hilar contours.  Bony thorax is demineralized  but intact.  IMPRESSION: No acute cardiopulmonary disease.   Electronically Signed   By: Douglas [name] M.D.   On: 01/17/2015 19:09   Dg Knee Complete 4 Views Right  01/17/2015   CLINICAL DATA:  Fall  EXAM: RIGHT KNEE - COMPLETE 4+ VIEW  COMPARISON:  None  FINDINGS: Osseous demineralization.  Oblique displaced fracture distal RIGHT femoral meta diaphysis, with distal fragment displaced medially and posteriorly.  Mild apex anterior angulation and overriding.  No articular extension.  Knee joint alignment normal.  No additional fracture, dislocation, or bone destruction.  Scattered vascular calcifications.  Old healed fracture of the proximal RIGHT fibular diaphysis.  Tiny cassette artifact  noted on all images.  IMPRESSION: Displaced and angulated distal RIGHT femoral metadiaphyseal fracture.   Electronically Signed   By: Lavonia Dana M.D.   On: 01/17/2015 17:55     EKG Interpretation   Date/Time:  Thursday January 17 2015 19:14:21 EST Ventricular Rate:  80 PR Interval:  148 QRS Duration: 79 QT Interval:  406 QTC Calculation: 468 R Axis:   5 Text Interpretation:  Sinus rhythm Premature atrial complexes Borderline  low voltage, extremity leads Non-specific ST-t changes Confirmed by Wilson Singer   MD, Seyon Strader ([id number]) on 01/17/2015 8:27:00 PM      MDM   Final diagnoses:  Fall  Pre-op chest exam  Fracture, femur, distal, right, closed, initial encounter   83yM with R knee pain after fall. Imaging as above. Closed injury. NVI. Discussed with Dr Luna Glasgow, on call locally for orthopedic surgery. Does not do distal femur fxs. Discussed with Dr Alain Marion, ortho, covering for [facility]. Requesting hospitalist admission. Plans for surgery tomorrow afternoon.   I personally preformed the services scribed in my presence. The recorded information has been reviewed is accurate. Douglas Manifold, MD.      Douglas Manifold, MD 01/17/15 2027

## 2015-01-17 NOTE — H&P (Signed)
PCP:   [name]., MD   Chief Complaint:  fell  HPI: 81 yo male was walking outside to check his  Mail when he missed a step and fell and hurt is right hip.  His wife found him on the ground minutes later in pain.  Normal state of health prior to this.  No LOC or head trauma.  Highly functioning at home lives with his wife.  Pain well controlled now.  Has right distal femur fx.  Review of Systems:  Positive and negative as per HPI otherwise all other systems are negative  Past Medical History: Past Medical History  Diagnosis Date  . Cancer   . Hypertension   . Diabetes mellitus   . Hyperlipidemia   . Cataracts, bilateral   . Hyperkalemia   . UTI (lower urinary tract infection)   . Calculus of kidney   . Sensorineural hearing loss, unspecified   . Epiglottitis   . Chronic ankle pain    History reviewed. No pertinent past surgical history.  Medications: Prior to Admission medications   Medication Sig Start Date End Date Taking? Authorizing Provider  amLODipine (NORVASC) 5 MG tablet Take 1 tablet (5 mg total) by mouth daily. 11/17/14  Yes Kathie Dike, MD  atorvastatin (LIPITOR) 40 MG tablet Take 40 mg by mouth daily.   Yes Historical Provider, MD  gabapentin (NEURONTIN) 300 MG capsule Take 600 mg by mouth at bedtime.    Yes Historical Provider, MD  insulin detemir (LEVEMIR) 100 UNIT/ML injection Inject 0.25 mLs (25 Units total) into the skin at bedtime. 11/17/14  Yes Kathie Dike, MD  hydrALAZINE (APRESOLINE) 25 MG tablet Take 1 tablet (25 mg total) by mouth 3 (three) times daily. Patient not taking: Reported on 01/17/2015 11/17/14   Kathie Dike, MD  Insulin Aspart (NOVOLOG [place]) Inject 12-15 Units into the skin 3 (three) times daily. BASED ON SLIDING SCALE.    Historical Provider, MD  ketorolac (ACULAR) 0.5 % ophthalmic solution Place 1 drop into both eyes 3 (three) times daily.    Historical Provider, MD    Allergies:  No Known Allergies  Social History:   reports that he has never smoked. He does not have any smokeless tobacco history on file. He reports that he does not drink alcohol or use illicit drugs.  Family History: History reviewed. No pertinent family history.  Physical Exam: Filed Vitals:   01/17/15 1900 01/17/15 1915 01/17/15 1930 01/17/15 1934  BP: 176/71   166/72  Pulse: 72 72 73 74  Temp:      TempSrc:      Resp:  14 17 15   Height:      Weight:      SpO2: 97% 98% 97% 97%   General appearance: alert, cooperative and no distress Head: Normocephalic, without obvious abnormality, atraumatic Eyes: negative Nose: Nares normal. Septum midline. Mucosa normal. No drainage or sinus tenderness. Neck: no JVD and supple, symmetrical, trachea midline Lungs: clear to auscultation bilaterally Heart: regular rate and rhythm, S1, S2 normal, no murmur, click, rub or gallop Abdomen: soft, non-tender; bowel sounds normal; no masses,  no organomegaly Extremities: extremities normal, atraumatic, no cyanosis or edema Pulses: 2+ and symmetric Skin: Skin color, texture, turgor normal. No rashes or lesions Neurologic: Grossly normal    Labs on Admission:   Recent Labs  01/17/15 1827  NA 139  K 4.9  CL 109  CO2 22  GLUCOSE 214*  BUN 35*  CREATININE 2.37*  CALCIUM 8.4    Recent  Labs  01/17/15 1827  WBC 4.2  NEUTROABS 2.0  HGB 9.8*  HCT 28.4*  MCV 89.3  PLT 91*   Radiological Exams on Admission: Dg Chest Portable 1 View  01/17/2015   CLINICAL DATA:  Pre-OP Rt knee FX. No current chest complaints. Hx HTN, diabetes, CA, nonsmoker  EXAM: PORTABLE CHEST - 1 VIEW  COMPARISON:  11/15/2014  FINDINGS: Low lung volumes. There is also elevation the right hemidiaphragm. Mild thinning and lung base opacity noted, greater on the right, most likely atelectasis. No convincing pneumonia. No pulmonary edema. No pleural effusion or pneumothorax.  Cardiac silhouette is normal in size and configuration. Normal mediastinal and hilar contours.   Bony thorax is demineralized but intact.  IMPRESSION: No acute cardiopulmonary disease.   Electronically Signed   By: Lajean Manes M.D.   On: 01/17/2015 19:09   Dg Knee Complete 4 Views Right  01/17/2015   CLINICAL DATA:  Fall  EXAM: RIGHT KNEE - COMPLETE 4+ VIEW  COMPARISON:  None  FINDINGS: Osseous demineralization.  Oblique displaced fracture distal RIGHT femoral meta diaphysis, with distal fragment displaced medially and posteriorly.  Mild apex anterior angulation and overriding.  No articular extension.  Knee joint alignment normal.  No additional fracture, dislocation, or bone destruction.  Scattered vascular calcifications.  Old healed fracture of the proximal RIGHT fibular diaphysis.  Tiny cassette artifact noted on all images.  IMPRESSION: Displaced and angulated distal RIGHT femoral metadiaphyseal fracture.   Electronically Signed   By: Lavonia Dana M.D.   On: 01/17/2015 17:55    Assessment/Plan  81 yo male mechanical fall with right femur fracture  Principal Problem:   Fracture, femur, distal-  Ortho here at [facility] cannot perform this surgery required to repair this fracture, will have to be transferred to [facility].  Npo after midnight.   Ortho on call at [facility] aware, request likely surgery tomorrow afteroon.  Will hold anticoagulants per ortho team.  preop ekg nsr, no acute changes.  Stable for surgery tomorrow.     Active Problems:  Stable unless o/w noted   Hypertension-  Prn hydralazine ordered with parameters   Diabetes-  ssi   CKD (chronic kidney disease) stage 4, GFR 15-29 ml/min- at his baseline   Anemia, chronic disease - at his baseline   Fall down steps  Transfer to [facility].  Full code.  DAVID,RACHAL A 01/17/2015, 8:40 PM

## 2015-01-17 NOTE — Consult Note (Signed)
ORTHOPAEDIC CONSULTATION  REQUESTING PHYSICIAN: Virgel Manifold, MD  Chief Complaint: right supracondylar femur fracture  HPI: Douglas [name] is a 81 y.o. male who missed a step and suffered a mechanical fall. C/o pain in his R knee.   Past Medical History  Diagnosis Date  . Cancer   . Hypertension   . Diabetes mellitus   . Hyperlipidemia   . Cataracts, bilateral   . Hyperkalemia   . UTI (lower urinary tract infection)   . Calculus of kidney   . Sensorineural hearing loss, unspecified   . Epiglottitis   . Chronic ankle pain    History reviewed. No pertinent past surgical history. History   Social History  . Marital Status: Married    Spouse Name: N/A    Number of Children: N/A  . Years of Education: N/A   Social History Main Topics  . Smoking status: Never Smoker   . Smokeless tobacco: None  . Alcohol Use: No  . Drug Use: No  . Sexual Activity: None   Other Topics Concern  . None   Social History Narrative   History reviewed. No pertinent family history. No Known Allergies Prior to Admission medications   Medication Sig Start Date End Date Taking? Authorizing Provider  amLODipine (NORVASC) 5 MG tablet Take 1 tablet (5 mg total) by mouth daily. 11/17/14  Yes Kathie Dike, MD  atorvastatin (LIPITOR) 40 MG tablet Take 40 mg by mouth daily.   Yes Historical Provider, MD  gabapentin (NEURONTIN) 300 MG capsule Take 600 mg by mouth at bedtime.    Yes Historical Provider, MD  insulin detemir (LEVEMIR) 100 UNIT/ML injection Inject 0.25 mLs (25 Units total) into the skin at bedtime. 11/17/14  Yes Kathie Dike, MD  hydrALAZINE (APRESOLINE) 25 MG tablet Take 1 tablet (25 mg total) by mouth 3 (three) times daily. Patient not taking: Reported on 01/17/2015 11/17/14   Kathie Dike, MD  Insulin Aspart (NOVOLOG [place]) Inject 12-15 Units into the skin 3 (three) times daily. BASED ON SLIDING SCALE.    Historical Provider, MD  ketorolac (ACULAR) 0.5 % ophthalmic solution  Place 1 drop into both eyes 3 (three) times daily.    Historical Provider, MD   Dg Knee Complete 4 Views Right  01/17/2015   CLINICAL DATA:  Fall  EXAM: RIGHT KNEE - COMPLETE 4+ VIEW  COMPARISON:  None  FINDINGS: Osseous demineralization.  Oblique displaced fracture distal RIGHT femoral meta diaphysis, with distal fragment displaced medially and posteriorly.  Mild apex anterior angulation and overriding.  No articular extension.  Knee joint alignment normal.  No additional fracture, dislocation, or bone destruction.  Scattered vascular calcifications.  Old healed fracture of the proximal RIGHT fibular diaphysis.  Tiny cassette artifact noted on all images.  IMPRESSION: Displaced and angulated distal RIGHT femoral metadiaphyseal fracture.   Electronically Signed   By: Lavonia Dana M.D.   On: 01/17/2015 17:55    Positive ROS: All other systems have been reviewed and were otherwise negative with the exception of those mentioned in the HPI and as above.  Labs cbc No results for input(s): WBC, HGB, HCT, PLT in the last 72 hours.  Labs inflam No results for input(s): CRP in the last 72 hours.  Invalid input(s): ESR  Labs coag No results for input(s): INR, PTT in the last 72 hours.  Invalid input(s): PT  No results for input(s): NA, K, CL, CO2, GLUCOSE, BUN, CREATININE, CALCIUM in the last 72 hours.  Physical Exam: [name]  Vitals:   01/17/15 1843  BP:   Pulse: 69  Temp:   Resp:    General: Alert, no acute distress Cardiovascular: No pedal edema Respiratory: No cyanosis, no use of accessory musculature GI: No organomegaly, abdomen is soft and non-tender Skin: No lesions in the area of chief complaint other than those listed below in MSK exam.  Neurologic: Sensation intact distally Psychiatric: Patient is competent for consent with normal mood and affect Lymphatic: No axillary or cervical lymphadenopathy  MUSCULOSKELETAL:  RLE: pain with ROM, compartments soft, global ldecreased  sensation distally due to his neuropathy, 2+DP, +TA/GS/EHL Other extremities are atraumatic with painless ROM and NVI.  Assessment: R [place] femur fracture  Plan: IM nail in the OR on 1/22 Weight Bearing Status: Bedrest for now PT VTE px: SCD's and ASA post op   Edmonia Lynch, D, MD Cell [phone]   01/17/2015 6:53 PM

## 2015-01-17 NOTE — ED Notes (Signed)
Walking down steps and left leg gave out.  Pain to right knee.

## 2015-01-18 ENCOUNTER — Inpatient Hospital Stay (HOSPITAL_COMMUNITY): Payer: Medicare Other | Admitting: Anesthesiology

## 2015-01-18 ENCOUNTER — Encounter (HOSPITAL_COMMUNITY)
Admission: EM | Disposition: A | Discharge status: Skilled Nursing Facility | Payer: Self-pay | Source: Home / Self Care | Attending: Internal Medicine

## 2015-01-18 ENCOUNTER — Inpatient Hospital Stay (HOSPITAL_COMMUNITY): Payer: Medicare Other

## 2015-01-18 HISTORY — PX: FEMUR IM NAIL: SHX1597

## 2015-01-18 LAB — GLUCOSE, CAPILLARY
GLUCOSE-CAPILLARY: 197 mg/dL — AB (ref 70–99)
GLUCOSE-CAPILLARY: 359 mg/dL — AB (ref 70–99)
Glucose-Capillary: 136 mg/dL — ABNORMAL HIGH (ref 70–99)
Glucose-Capillary: 144 mg/dL — ABNORMAL HIGH (ref 70–99)
Glucose-Capillary: 175 mg/dL — ABNORMAL HIGH (ref 70–99)
Glucose-Capillary: 182 mg/dL — ABNORMAL HIGH (ref 70–99)

## 2015-01-18 LAB — SURGICAL PCR SCREEN
MRSA, PCR: NEGATIVE
Staphylococcus aureus: NEGATIVE

## 2015-01-18 LAB — ABO/RH: ABO/RH(D): A POS

## 2015-01-18 SURGERY — INSERTION, INTRAMEDULLARY ROD, FEMUR, RETROGRADE
Anesthesia: General | Site: Leg Upper | Laterality: Right

## 2015-01-18 MED ORDER — ASPIRIN EC 325 MG PO TBEC: 325.0000 mg | DELAYED_RELEASE_TABLET | Freq: Every day | ORAL | Status: DC

## 2015-01-18 MED ORDER — FENTANYL CITRATE 0.05 MG/ML IJ SOLN
INTRAMUSCULAR | Status: AC
  Filled 2015-01-18: qty 5

## 2015-01-18 MED ORDER — PROPOFOL 10 MG/ML IV BOLUS
INTRAVENOUS | Status: AC
  Filled 2015-01-18: qty 20

## 2015-01-18 MED ORDER — 0.9 % SODIUM CHLORIDE (POUR BTL) OPTIME
TOPICAL | Status: DC | PRN
  Administered 2015-01-18: 1000 mL

## 2015-01-18 MED ORDER — ONDANSETRON HCL 4 MG PO TABS: 4.0000 mg | ORAL_TABLET | Freq: Four times a day (QID) | ORAL | Status: DC | PRN

## 2015-01-18 MED ORDER — ONDANSETRON HCL 4 MG/2ML IJ SOLN
4.0000 mg | Freq: Four times a day (QID) | INTRAMUSCULAR | Status: DC | PRN
  Filled 2015-01-18: qty 2

## 2015-01-18 MED ORDER — SODIUM CHLORIDE 0.9 % IJ SOLN
INTRAMUSCULAR | Status: AC
  Filled 2015-01-18: qty 10

## 2015-01-18 MED ORDER — ARTIFICIAL TEARS OP OINT
TOPICAL_OINTMENT | OPHTHALMIC | Status: AC
  Filled 2015-01-18: qty 3.5

## 2015-01-18 MED ORDER — SODIUM CHLORIDE 0.9 % IV SOLN
10.0000 mg | INTRAVENOUS | Status: DC | PRN
  Administered 2015-01-18: 10 ug/min via INTRAVENOUS

## 2015-01-18 MED ORDER — METOCLOPRAMIDE HCL 5 MG/ML IJ SOLN: 5.0000 mg | Freq: Three times a day (TID) | INTRAMUSCULAR | Status: DC | PRN

## 2015-01-18 MED ORDER — ARTIFICIAL TEARS OP OINT
TOPICAL_OINTMENT | OPHTHALMIC | Status: DC | PRN
  Administered 2015-01-18: 1 via OPHTHALMIC

## 2015-01-18 MED ORDER — NEOSTIGMINE METHYLSULFATE 10 MG/10ML IV SOLN
INTRAVENOUS | Status: AC
  Filled 2015-01-18: qty 1

## 2015-01-18 MED ORDER — NEOSTIGMINE METHYLSULFATE 10 MG/10ML IV SOLN
INTRAVENOUS | Status: DC | PRN
  Administered 2015-01-18: 4 mg via INTRAVENOUS

## 2015-01-18 MED ORDER — EPHEDRINE SULFATE 50 MG/ML IJ SOLN
INTRAMUSCULAR | Status: AC
  Filled 2015-01-18: qty 1

## 2015-01-18 MED ORDER — DEXTROSE-NACL 5-0.45 % IV SOLN
INTRAVENOUS | Status: AC
  Administered 2015-01-18: 08:00:00 via INTRAVENOUS

## 2015-01-18 MED ORDER — CEFAZOLIN SODIUM-DEXTROSE 2-3 GM-% IV SOLR
2.0000 g | Freq: Four times a day (QID) | INTRAVENOUS | Status: AC
  Administered 2015-01-18 – 2015-01-19 (×3): 2 g via INTRAVENOUS
  Filled 2015-01-18 (×3): qty 50

## 2015-01-18 MED ORDER — ATORVASTATIN CALCIUM 40 MG PO TABS
40.0000 mg | ORAL_TABLET | Freq: Every day | ORAL | Status: DC
  Administered 2015-01-19 – 2015-01-21 (×3): 40 mg via ORAL
  Filled 2015-01-18 (×4): qty 1

## 2015-01-18 MED ORDER — HYDROCODONE-ACETAMINOPHEN 5-325 MG PO TABS: 2.0000 | ORAL_TABLET | ORAL | Status: DC | PRN

## 2015-01-18 MED ORDER — HYDRALAZINE HCL 25 MG PO TABS
25.0000 mg | ORAL_TABLET | Freq: Three times a day (TID) | ORAL | Status: DC
  Administered 2015-01-19: 25 mg via ORAL
  Filled 2015-01-18 (×12): qty 1

## 2015-01-18 MED ORDER — LACTATED RINGERS IV SOLN
INTRAVENOUS | Status: DC | PRN
  Administered 2015-01-18 (×2): via INTRAVENOUS

## 2015-01-18 MED ORDER — PROPOFOL 10 MG/ML IV BOLUS
INTRAVENOUS | Status: DC | PRN
  Administered 2015-01-18: 10 mg via INTRAVENOUS
  Administered 2015-01-18: 80 mg via INTRAVENOUS

## 2015-01-18 MED ORDER — METOCLOPRAMIDE HCL 5 MG PO TABS
5.0000 mg | ORAL_TABLET | Freq: Three times a day (TID) | ORAL | Status: DC | PRN
  Filled 2015-01-18: qty 2

## 2015-01-18 MED ORDER — LIDOCAINE HCL (CARDIAC) 20 MG/ML IV SOLN
INTRAVENOUS | Status: DC | PRN
  Administered 2015-01-18: 50 mg via INTRAVENOUS

## 2015-01-18 MED ORDER — NALOXONE HCL 0.4 MG/ML IJ SOLN
0.2000 mg | INTRAMUSCULAR | Status: DC | PRN
  Filled 2015-01-18: qty 1

## 2015-01-18 MED ORDER — PHENYLEPHRINE HCL 10 MG/ML IJ SOLN
INTRAMUSCULAR | Status: DC | PRN
  Administered 2015-01-18: 120 ug via INTRAVENOUS
  Administered 2015-01-18 (×2): 40 ug via INTRAVENOUS

## 2015-01-18 MED ORDER — ROCURONIUM BROMIDE 50 MG/5ML IV SOLN
INTRAVENOUS | Status: AC
  Filled 2015-01-18: qty 1

## 2015-01-18 MED ORDER — ROCURONIUM BROMIDE 100 MG/10ML IV SOLN
INTRAVENOUS | Status: DC | PRN
  Administered 2015-01-18: 40 mg via INTRAVENOUS

## 2015-01-18 MED ORDER — ONDANSETRON HCL 4 MG/2ML IJ SOLN
INTRAMUSCULAR | Status: AC
  Filled 2015-01-18: qty 2

## 2015-01-18 MED ORDER — LACTATED RINGERS IV SOLN
INTRAVENOUS | Status: DC
  Administered 2015-01-18: 13:00:00 via INTRAVENOUS

## 2015-01-18 MED ORDER — FENTANYL CITRATE 0.05 MG/ML IJ SOLN: 25.0000 ug | INTRAMUSCULAR | Status: DC | PRN

## 2015-01-18 MED ORDER — AMLODIPINE BESYLATE 5 MG PO TABS
5.0000 mg | ORAL_TABLET | Freq: Every day | ORAL | Status: DC
  Administered 2015-01-19 – 2015-01-21 (×3): 5 mg via ORAL
  Filled 2015-01-18 (×4): qty 1

## 2015-01-18 MED ORDER — GLYCOPYRROLATE 0.2 MG/ML IJ SOLN
INTRAMUSCULAR | Status: DC | PRN
  Administered 2015-01-18: 0.6 mg via INTRAVENOUS

## 2015-01-18 MED ORDER — ASPIRIN EC 325 MG PO TBEC
325.0000 mg | DELAYED_RELEASE_TABLET | Freq: Every day | ORAL | Status: DC
  Administered 2015-01-18 – 2015-01-21 (×4): 325 mg via ORAL
  Filled 2015-01-18 (×4): qty 1

## 2015-01-18 MED ORDER — CEFAZOLIN SODIUM-DEXTROSE 2-3 GM-% IV SOLR
2.0000 g | Freq: Three times a day (TID) | INTRAVENOUS | Status: DC
  Administered 2015-01-18: 2 g via INTRAVENOUS
  Filled 2015-01-18 (×3): qty 50

## 2015-01-18 MED ORDER — FENTANYL CITRATE 0.05 MG/ML IJ SOLN
INTRAMUSCULAR | Status: DC | PRN
  Administered 2015-01-18: 50 ug via INTRAVENOUS
  Administered 2015-01-18 (×2): 25 ug via INTRAVENOUS

## 2015-01-18 MED ORDER — ONDANSETRON HCL 4 MG/2ML IJ SOLN
INTRAMUSCULAR | Status: DC | PRN
  Administered 2015-01-18: 4 mg via INTRAVENOUS

## 2015-01-18 MED ORDER — GLYCOPYRROLATE 0.2 MG/ML IJ SOLN
INTRAMUSCULAR | Status: AC
  Filled 2015-01-18: qty 1

## 2015-01-18 MED ORDER — GABAPENTIN 300 MG PO CAPS
600.0000 mg | ORAL_CAPSULE | Freq: Every day | ORAL | Status: DC
  Administered 2015-01-18 – 2015-01-19 (×2): 600 mg via ORAL
  Administered 2015-01-20: 300 mg via ORAL
  Filled 2015-01-18 (×4): qty 2

## 2015-01-18 MED ORDER — FENTANYL CITRATE 0.05 MG/ML IJ SOLN
INTRAMUSCULAR | Status: AC
  Administered 2015-01-18: 50 ug via INTRAVENOUS
  Filled 2015-01-18: qty 2

## 2015-01-18 MED ORDER — PROMETHAZINE HCL 25 MG/ML IJ SOLN: 6.2500 mg | INTRAMUSCULAR | Status: DC | PRN

## 2015-01-18 MED ORDER — FENTANYL CITRATE 0.05 MG/ML IJ SOLN
50.0000 ug | Freq: Once | INTRAMUSCULAR | Status: AC
  Administered 2015-01-18: 50 ug via INTRAVENOUS

## 2015-01-18 MED ORDER — MEPERIDINE HCL 25 MG/ML IJ SOLN: 6.2500 mg | INTRAMUSCULAR | Status: DC | PRN

## 2015-01-18 MED ORDER — INSULIN DETEMIR 100 UNIT/ML ~~LOC~~ SOLN
25.0000 [IU] | Freq: Every day | SUBCUTANEOUS | Status: DC
  Administered 2015-01-18: 25 [IU] via SUBCUTANEOUS
  Filled 2015-01-18 (×2): qty 0.25

## 2015-01-18 MED ORDER — GLYCOPYRROLATE 0.2 MG/ML IJ SOLN
INTRAMUSCULAR | Status: AC
  Filled 2015-01-18: qty 3

## 2015-01-18 MED ORDER — DEXAMETHASONE SODIUM PHOSPHATE 4 MG/ML IJ SOLN
INTRAMUSCULAR | Status: DC | PRN
  Administered 2015-01-18: 4 mg via INTRAVENOUS

## 2015-01-18 SURGICAL SUPPLY — 60 items
BANDAGE ELASTIC 4 VELCRO ST LF (GAUZE/BANDAGES/DRESSINGS) ×1 IMPLANT
BANDAGE ELASTIC 6 VELCRO ST LF (GAUZE/BANDAGES/DRESSINGS) ×3 IMPLANT
BIT DRILL CALIBRATED 4.3MMX365 (DRILL) ×1 IMPLANT
BIT DRILL CROWE PNT TWST 4.5MM (DRILL) ×1 IMPLANT
BLADE SURG ROTATE 9660 (MISCELLANEOUS) IMPLANT
BNDG GAUZE ELAST 4 BULKY (GAUZE/BANDAGES/DRESSINGS) ×1 IMPLANT
CLSR STERI-STRIP ANTIMIC 1/2X4 (GAUZE/BANDAGES/DRESSINGS) ×2 IMPLANT
DRAPE C-ARM 42X72 X-RAY (DRAPES) ×3 IMPLANT
DRAPE C-ARMOR (DRAPES) ×3 IMPLANT
DRAPE IMP U-DRAPE 54X76 (DRAPES) ×3 IMPLANT
DRAPE ORTHO SPLIT 77X108 STRL (DRAPES) ×6
DRAPE SURG ORHT 6 SPLT 77X108 (DRAPES) ×4 IMPLANT
DRAPE U-SHAPE 47X51 STRL (DRAPES) ×3 IMPLANT
DRILL CALIBRATED 4.3MMX365 (DRILL) ×3
DRILL CROWE POINT TWIST 4.5MM (DRILL) ×3
DRSG ADAPTIC 3X8 NADH LF (GAUZE/BANDAGES/DRESSINGS) ×1 IMPLANT
DRSG MEPILEX BORDER 4X4 (GAUZE/BANDAGES/DRESSINGS) ×8 IMPLANT
DRSG PAD ABDOMINAL 8X10 ST (GAUZE/BANDAGES/DRESSINGS) ×4 IMPLANT
ELECT REM PT RETURN 9FT ADLT (ELECTROSURGICAL) ×3
ELECTRODE REM PT RTRN 9FT ADLT (ELECTROSURGICAL) ×2 IMPLANT
GAUZE SPONGE 4X4 12PLY STRL (GAUZE/BANDAGES/DRESSINGS) ×1 IMPLANT
GLOVE BIO SURGEON STRL SZ8 (GLOVE) ×3 IMPLANT
GLOVE BIOGEL PI IND STRL 8 (GLOVE) ×2 IMPLANT
GLOVE BIOGEL PI INDICATOR 8 (GLOVE) ×1
GLOVE BIOGEL PI ORTHO PRO SZ8 (GLOVE) ×1
GLOVE PI ORTHO PRO STRL SZ8 (GLOVE) ×2 IMPLANT
GLOVE SURG ORTHO 8.0 STRL STRW (GLOVE) ×3 IMPLANT
GOWN STRL REUS W/ TWL LRG LVL3 (GOWN DISPOSABLE) ×4 IMPLANT
GOWN STRL REUS W/ TWL XL LVL3 (GOWN DISPOSABLE) ×2 IMPLANT
GOWN STRL REUS W/TWL 2XL LVL3 (GOWN DISPOSABLE) ×3 IMPLANT
GOWN STRL REUS W/TWL LRG LVL3 (GOWN DISPOSABLE) ×6
GOWN STRL REUS W/TWL XL LVL3 (GOWN DISPOSABLE) ×3
GUIDEWIRE BEAD TIP (WIRE) ×2 IMPLANT
KIT BASIN OR (CUSTOM PROCEDURE TRAY) ×3 IMPLANT
KIT ROOM TURNOVER OR (KITS) ×3 IMPLANT
MANIFOLD NEPTUNE II (INSTRUMENTS) ×3 IMPLANT
NAIL FEM RETRO 10.5X360 (Nail) ×2 IMPLANT
NS IRRIG 1000ML POUR BTL (IV SOLUTION) ×3 IMPLANT
PACK TOTAL JOINT (CUSTOM PROCEDURE TRAY) ×3 IMPLANT
PACK UNIVERSAL I (CUSTOM PROCEDURE TRAY) ×3 IMPLANT
PAD ARMBOARD 7.5X6 YLW CONV (MISCELLANEOUS) ×6 IMPLANT
PAD CAST 4YDX4 CTTN HI CHSV (CAST SUPPLIES) ×1 IMPLANT
PADDING CAST COTTON 4X4 STRL (CAST SUPPLIES)
PADDING CAST COTTON 6X4 STRL (CAST SUPPLIES) ×1 IMPLANT
SCREW CORT TI DBL LEAD 5X30 (Screw) ×2 IMPLANT
SCREW CORT TI DBL LEAD 5X56 (Screw) ×2 IMPLANT
SCREW CORT TI DBL LEAD 5X60 (Screw) ×2 IMPLANT
SCREW CORT TI DBL LEAD 5X70 (Screw) ×2 IMPLANT
SCREW CORT TI DBLE LEAD 5X52 (Screw) ×2 IMPLANT
STAPLER VISISTAT 35W (STAPLE) IMPLANT
SUT MNCRL AB 4-0 PS2 18 (SUTURE) ×3 IMPLANT
SUT MON AB 2-0 CT1 27 (SUTURE) ×3 IMPLANT
SUT MON AB 2-0 CT1 36 (SUTURE) ×2 IMPLANT
SUT VIC AB 0 CT1 27 (SUTURE) ×3
SUT VIC AB 0 CT1 27XBRD ANBCTR (SUTURE) ×3 IMPLANT
TOWEL OR 17X24 6PK STRL BLUE (TOWEL DISPOSABLE) ×3 IMPLANT
TOWEL OR 17X26 10 PK STRL BLUE (TOWEL DISPOSABLE) ×6 IMPLANT
TOWEL OR NON WOVEN STRL DISP B (DISPOSABLE) ×1 IMPLANT
TRAY FOLEY CATH 16FRSI W/METER (SET/KITS/TRAYS/PACK) IMPLANT
WATER STERILE IRR 1000ML POUR (IV SOLUTION) ×2 IMPLANT

## 2015-01-18 NOTE — Interval H&P Note (Signed)
History and Physical Interval Note:  01/18/2015 2:13 PM  Douglas [name]  has presented today for surgery, with the diagnosis of Fractured right femur  The various methods of treatment have been discussed with the patient and family. After consideration of risks, benefits and other options for treatment, the patient has consented to  Procedure(s): OPEN REDUCTION INTERNAL FIXATION (ORIF) DISTAL FEMUR FRACTURE (Right) as a surgical intervention .  The patient's history has been reviewed, patient examined, no change in status, stable for surgery.  I have reviewed the patient's chart and labs.  Questions were answered to the patient's satisfaction.     MURPHY, TIMOTHY, D

## 2015-01-18 NOTE — H&P (View-Only) (Signed)
ORTHOPAEDIC CONSULTATION  REQUESTING PHYSICIAN: Virgel Manifold, MD  Chief Complaint: right supracondylar femur fracture  HPI: Douglas [name] is a 81 y.o. male who missed a step and suffered a mechanical fall. C/o pain in his R knee.   Past Medical History  Diagnosis Date  . Cancer   . Hypertension   . Diabetes mellitus   . Hyperlipidemia   . Cataracts, bilateral   . Hyperkalemia   . UTI (lower urinary tract infection)   . Calculus of kidney   . Sensorineural hearing loss, unspecified   . Epiglottitis   . Chronic ankle pain    History reviewed. No pertinent past surgical history. History   Social History  . Marital Status: Married    Spouse Name: N/A    Number of Children: N/A  . Years of Education: N/A   Social History Main Topics  . Smoking status: Never Smoker   . Smokeless tobacco: None  . Alcohol Use: No  . Drug Use: No  . Sexual Activity: None   Other Topics Concern  . None   Social History Narrative   History reviewed. No pertinent family history. No Known Allergies Prior to Admission medications   Medication Sig Start Date End Date Taking? Authorizing Provider  amLODipine (NORVASC) 5 MG tablet Take 1 tablet (5 mg total) by mouth daily. 11/17/14  Yes Kathie Dike, MD  atorvastatin (LIPITOR) 40 MG tablet Take 40 mg by mouth daily.   Yes Historical Provider, MD  gabapentin (NEURONTIN) 300 MG capsule Take 600 mg by mouth at bedtime.    Yes Historical Provider, MD  insulin detemir (LEVEMIR) 100 UNIT/ML injection Inject 0.25 mLs (25 Units total) into the skin at bedtime. 11/17/14  Yes Kathie Dike, MD  hydrALAZINE (APRESOLINE) 25 MG tablet Take 1 tablet (25 mg total) by mouth 3 (three) times daily. Patient not taking: Reported on 01/17/2015 11/17/14   Kathie Dike, MD  Insulin Aspart (NOVOLOG [place]) Inject 12-15 Units into the skin 3 (three) times daily. BASED ON SLIDING SCALE.    Historical Provider, MD  ketorolac (ACULAR) 0.5 % ophthalmic solution  Place 1 drop into both eyes 3 (three) times daily.    Historical Provider, MD   Dg Knee Complete 4 Views Right  01/17/2015   CLINICAL DATA:  Fall  EXAM: RIGHT KNEE - COMPLETE 4+ VIEW  COMPARISON:  None  FINDINGS: Osseous demineralization.  Oblique displaced fracture distal RIGHT femoral meta diaphysis, with distal fragment displaced medially and posteriorly.  Mild apex anterior angulation and overriding.  No articular extension.  Knee joint alignment normal.  No additional fracture, dislocation, or bone destruction.  Scattered vascular calcifications.  Old healed fracture of the proximal RIGHT fibular diaphysis.  Tiny cassette artifact noted on all images.  IMPRESSION: Displaced and angulated distal RIGHT femoral metadiaphyseal fracture.   Electronically Signed   By: Lavonia Dana M.D.   On: 01/17/2015 17:55    Positive ROS: All other systems have been reviewed and were otherwise negative with the exception of those mentioned in the HPI and as above.  Labs cbc No results for input(s): WBC, HGB, HCT, PLT in the last 72 hours.  Labs inflam No results for input(s): CRP in the last 72 hours.  Invalid input(s): ESR  Labs coag No results for input(s): INR, PTT in the last 72 hours.  Invalid input(s): PT  No results for input(s): NA, K, CL, CO2, GLUCOSE, BUN, CREATININE, CALCIUM in the last 72 hours.  Physical Exam: [name]  Vitals:   01/17/15 1843  BP:   Pulse: 69  Temp:   Resp:    General: Alert, no acute distress Cardiovascular: No pedal edema Respiratory: No cyanosis, no use of accessory musculature GI: No organomegaly, abdomen is soft and non-tender Skin: No lesions in the area of chief complaint other than those listed below in MSK exam.  Neurologic: Sensation intact distally Psychiatric: Patient is competent for consent with normal mood and affect Lymphatic: No axillary or cervical lymphadenopathy  MUSCULOSKELETAL:  RLE: pain with ROM, compartments soft, global ldecreased  sensation distally due to his neuropathy, 2+DP, +TA/GS/EHL Other extremities are atraumatic with painless ROM and NVI.  Assessment: R [place] femur fracture  Plan: IM nail in the OR on 1/22 Weight Bearing Status: Bedrest for now PT VTE px: SCD's and ASA post op   Edmonia Lynch, D, MD Cell [phone]   01/17/2015 6:53 PM

## 2015-01-18 NOTE — Progress Notes (Signed)
Nutrition Brief Note  Patient identified on the Malnutrition Screening Tool (MST) Report  Wt Readings from Last 15 Encounters:  01/17/15 144 lb (65.318 kg)  11/27/14 154 lb 5.1 oz (69.999 kg)  11/17/14 154 lb 5.2 oz (70 kg)  11/15/14 140 lb (63.504 kg)  10/15/14 140 lb (63.504 kg)  10/13/14 140 lb (63.504 kg)  04/19/14 145 lb (65.772 kg)  05/05/13 158 lb (71.668 kg)    Body mass index is 25.51 kg/(m^2). Patient meets criteria for Overweight based on current BMI.   Current diet order is currently NPO for surgery. Pt admitted for right femer fx. Labs and medications reviewed. Minimal weight loss and has regained since December, no loss of appetite.  No nutrition interventions warranted at this time. If nutrition issues arise, please consult RD.   Elmer Picker MS Dietetic Intern Pager Number [phone]

## 2015-01-18 NOTE — Anesthesia Preprocedure Evaluation (Addendum)
Anesthesia Evaluation  Patient identified by MRN, date of birth, ID band Patient awake    Reviewed: Allergy & Precautions, NPO status , Patient's Chart, lab work & pertinent test results, reviewed documented beta blocker date and time   Airway Mallampati: II   Neck ROM: Full    Dental  (+) Partial Upper, Partial Lower, Missing,    Pulmonary neg pulmonary ROS,  breath sounds clear to auscultation        Cardiovascular hypertension, Pt. on medications Rhythm:Regular  ECHO 2014 EF 65%, STRESS 2014 normal   Neuro/Psych    GI/Hepatic   Endo/Other  diabetes, Poorly Controlled, Type 2  Renal/GU Renal InsufficiencyRenal diseaseGFR 28%     Musculoskeletal   Abdominal (+)  Abdomen: soft.    Peds  Hematology  (+) anemia , 9.8/28 H/H   Anesthesia Other Findings Hard of hearing  Reproductive/Obstetrics                           Anesthesia Physical Anesthesia Plan  ASA: III  Anesthesia Plan: General   Post-op Pain Management:    Induction: Intravenous  Airway Management Planned: Oral ETT  Additional Equipment:   Intra-op Plan:   Post-operative Plan:   Informed Consent: I have reviewed the patients History and Physical, chart, labs and discussed the procedure including the risks, benefits and alternatives for the proposed anesthesia with the patient or authorized representative who has indicated his/her understanding and acceptance.     Plan Discussed with:   Anesthesia Plan Comments: (PLTS 90K)        Anesthesia Quick Evaluation

## 2015-01-18 NOTE — Progress Notes (Signed)
Patient Demographics  Douglas [name], is a 81 y.o. male, DOB - 07/19/1931, IAX:[id number]  Admit date - 01/17/2015   Admitting Physician [name], MD  Outpatient Primary MD for the patient is [name],[name], MD  LOS - 1   Chief Complaint  Patient presents with  . Fall        Subjective:   Douglas [name] today has, No headache, No chest pain, No abdominal pain - No Nausea, No new weakness tingling or numbness, No Cough - SOB.    Assessment & Plan    1. Right closed Fracture, femur, distal -   orthopedics consulted due for open reduction internal fixation.   Cardio-Pulm Risk stratification for surgery and recommendations to minimize the same:-  A.Cardio-Pulmonary Risk -  this patient is a moderate for adverse Cardio-Pulmonary  Outcome  from surgery, the risks and benefits were discussed and acceptable to patient and wife  Recommendations for optimizing Cardio-Pulmonary  Risk risk factors  1. Keep SBP<140, HR<85, use Lopressor 5mg  IV q4hrs PRN, or B.Blocker drip PRN. 2. Moniotr I&Os. 3. Minimal sedation and Narcotics. 4. Good pulmunary toilet. 5. PRN Nebs and as needed oxygen to keep Pox>90% 6. Hb>8, transfuse as needed- Lasix 10mg  IV after each unit PRBC Transfused.   B.Bleeding Risk - no previous surgical complications, no easy bruising,  Antiplate meds none.    Lab Results  Component Value Date   PLT 91* 01/17/2015                  Lab Results  Component Value Date   INR 1.06 11/15/2014      Will request Surgeon to please Order DVT prophylaxis of his/her choice, along with activity, weight bearing precautions and diet if appropriate.      2. Chronic kidney disease stage IV. Creatinine at baseline which is around 2.5.   3. Essential hypertension. Resume home  medications which are Norvasc and hydralazine.    4. Dyslipidemia. Will place on statin postop.    5. DM type II. On Levemir and sliding scale continue.   CBG (last 3)   Recent Labs  01/17/15 2330 01/18/15 0452 01/18/15 0802  GLUCAP 163* 136* 144*       Code Status: Full  Family Communication: wife  Disposition Plan: TBD   Procedures     Consults  Ortho   Medications  Scheduled Meds: .  ceFAZolin (ANCEF) IV  2 g Intravenous 3 times per day  . insulin aspart  0-9 Units Subcutaneous 6 times per day   Continuous Infusions: . dextrose 5 % and 0.45% NaCl 50 mL/hr at 01/18/15 0748   PRN Meds:.diphenhydrAMINE, hydrALAZINE, HYDROcodone-acetaminophen, HYDROmorphone (DILAUDID) injection, naLOXone (NARCAN)  injection, ondansetron (ZOFRAN) IV  DVT Prophylaxis    SCDs    Lab Results  Component Value Date   PLT 91* 01/17/2015    Antibiotics    Anti-infectives    Start     Dose/Rate Route Frequency Ordered Stop   01/18/15 0900  ceFAZolin (ANCEF) IVPB 2 g/50 mL premix     2 g100 mL/hr over 30 Minutes Intravenous 3 times per day 01/18/15 0850 01/19/15 0559          Objective:   Filed Vitals:   01/17/15  2115 01/17/15 2258 01/18/15 0000 01/18/15 0438  BP: 177/90 167/72  119/54  Pulse:  83  77  Temp:  97.3 F (36.3 C)  98.7 F (37.1 C)  TempSrc:  Oral  Axillary  Resp:  14 14 16   Height:      Weight:      SpO2:  100% 95% 94%    Wt Readings from Last 3 Encounters:  01/17/15 65.318 kg (144 lb)  11/27/14 69.999 kg (154 lb 5.1 oz)  11/17/14 70 kg (154 lb 5.2 oz)     Intake/Output Summary (Last 24 hours) at 01/18/15 1026 Last data filed at 01/18/15 0440  Gross per 24 hour  Intake      0 ml  Output    300 ml  Net   -300 ml     Physical Exam  Somnolent, No new F.N deficits, Normal affect .AT,PERRAL Supple Neck,No JVD, No cervical lymphadenopathy appriciated.  Symmetrical Chest wall movement, Good air movement bilaterally, CTAB RRR,No  Gallops,Rubs or new Murmurs, No Parasternal Heave +ve B.Sounds, Abd Soft, No tenderness, No organomegaly appriciated, No rebound - guarding or rigidity. No Cyanosis, Clubbing or edema, No new Rash or bruise      Data Review   Micro Results Recent Results (from the past 240 hour(s))  Surgical pcr screen     Status: None   Collection Time: 01/18/15  5:32 AM  Result Value Ref Range Status   MRSA, PCR NEGATIVE NEGATIVE Final   Staphylococcus aureus NEGATIVE NEGATIVE Final    Comment:        The Xpert SA Assay (FDA approved for NASAL specimens in patients over 69 years of age), is one component of a comprehensive surveillance program.  Test performance has been validated by [facility] for patients greater than or equal to 65 year old. It is not intended to diagnose infection nor to guide or monitor treatment.     Radiology Reports Dg Chest Portable 1 View  01/17/2015   CLINICAL DATA:  Pre-OP Rt knee FX. No current chest complaints. Hx HTN, diabetes, CA, nonsmoker  EXAM: PORTABLE CHEST - 1 VIEW  COMPARISON:  11/15/2014  FINDINGS: Low lung volumes. There is also elevation the right hemidiaphragm. Mild thinning and lung base opacity noted, greater on the right, most likely atelectasis. No convincing pneumonia. No pulmonary edema. No pleural effusion or pneumothorax.  Cardiac silhouette is normal in size and configuration. Normal mediastinal and hilar contours.  Bony thorax is demineralized but intact.  IMPRESSION: No acute cardiopulmonary disease.   Electronically Signed   By: Lajean Manes M.D.   On: 01/17/2015 19:09   Dg Knee Complete 4 Views Right  01/17/2015   CLINICAL DATA:  Fall  EXAM: RIGHT KNEE - COMPLETE 4+ VIEW  COMPARISON:  None  FINDINGS: Osseous demineralization.  Oblique displaced fracture distal RIGHT femoral meta diaphysis, with distal fragment displaced medially and posteriorly.  Mild apex anterior angulation and overriding.  No articular extension.  Knee joint alignment  normal.  No additional fracture, dislocation, or bone destruction.  Scattered vascular calcifications.  Old healed fracture of the proximal RIGHT fibular diaphysis.  Tiny cassette artifact noted on all images.  IMPRESSION: Displaced and angulated distal RIGHT femoral metadiaphyseal fracture.   Electronically Signed   By: Lavonia Dana M.D.   On: 01/17/2015 17:55     CBC  Recent Labs Lab 01/17/15 1827  WBC 4.2  HGB 9.8*  HCT 28.4*  PLT 91*  MCV 89.3  MCH 30.8  MCHC  34.5  RDW 13.4  LYMPHSABS 1.5  MONOABS 0.3  EOSABS 0.3  BASOSABS 0.1    Chemistries   Recent Labs Lab 01/17/15 1827  NA 139  K 4.9  CL 109  CO2 22  GLUCOSE 214*  BUN 35*  CREATININE 2.37*  CALCIUM 8.4   ------------------------------------------------------------------------------------------------------------------ estimated creatinine clearance is 19.7 mL/min (by C-G formula based on Cr of 2.37). ------------------------------------------------------------------------------------------------------------------ No results for input(s): HGBA1C in the last 72 hours. ------------------------------------------------------------------------------------------------------------------ No results for input(s): CHOL, HDL, LDLCALC, TRIG, CHOLHDL, LDLDIRECT in the last 72 hours. ------------------------------------------------------------------------------------------------------------------ No results for input(s): TSH, T4TOTAL, T3FREE, THYROIDAB in the last 72 hours.  Invalid input(s): FREET3 ------------------------------------------------------------------------------------------------------------------ No results for input(s): VITAMINB12, FOLATE, FERRITIN, TIBC, IRON, RETICCTPCT in the last 72 hours.  Coagulation profile No results for input(s): INR, PROTIME in the last 168 hours.  No results for input(s): DDIMER in the last 72 hours.  Cardiac Enzymes No results for input(s): CKMB, TROPONINI, MYOGLOBIN in the  last 168 hours.  Invalid input(s): CK ------------------------------------------------------------------------------------------------------------------ Invalid input(s): POCBNP     Time Spent in minutes  35   SINGH,PRASHANT K M.D on 01/18/2015 at 10:26 AM  Between 7am to 7pm - Pager - [phone]  After 7pm go to [email or web address] - [place] [facility] Office  [phone]

## 2015-01-18 NOTE — Transfer of Care (Signed)
Immediate Anesthesia Transfer of Care Note  Patient: Douglas [name]  Procedure(s) Performed: Procedure(s): INTRAMEDULLARY (IM) RETROGRADE FEMORAL NAILING (Right)  Patient Location: PACU  Anesthesia Type:General  Level of Consciousness: sedated  Airway & Oxygen Therapy: Patient Spontanous Breathing and Patient connected to nasal cannula oxygen  Post-op Assessment: Report given to PACU RN, Post -op Vital signs reviewed and stable and Patient moving all extremities X 4  Post vital signs: Reviewed and stable  Complications: No apparent anesthesia complications

## 2015-01-18 NOTE — Anesthesia Postprocedure Evaluation (Signed)
  Anesthesia Post-op Note  Patient: Douglas [name]  Procedure(s) Performed: Procedure(s): INTRAMEDULLARY (IM) RETROGRADE FEMORAL NAILING (Right)  Patient Location: PACU  Anesthesia Type:General  Level of Consciousness: awake and patient cooperative  Airway and Oxygen Therapy: Patient Spontanous Breathing and Patient connected to nasal cannula oxygen  Post-op Pain: none  Post-op Assessment: Post-op Vital signs reviewed, Patient's Cardiovascular Status Stable, Respiratory Function Stable, Patent Airway and No signs of Nausea or vomiting  Post-op Vital Signs: stable  Last Vitals:  Filed Vitals:   01/18/15 1645  BP: 143/51  Pulse: 92  Temp: 36.8 C  Resp: 15    Complications: No apparent anesthesia complications

## 2015-01-18 NOTE — Anesthesia Procedure Notes (Signed)
Procedure Name: Intubation Date/Time: 01/18/2015 2:59 PM Performed by: Jacquiline Doe A Pre-anesthesia Checklist: Patient identified, Timeout performed, Emergency Drugs available, Suction available and Patient being monitored Patient Re-evaluated:Patient Re-evaluated prior to inductionOxygen Delivery Method: Circle system utilized Preoxygenation: Pre-oxygenation with 100% oxygen Intubation Type: IV induction and Cricoid Pressure applied Ventilation: Mask ventilation without difficulty Laryngoscope Size: Mac and 4 Grade View: Grade I Tube type: Oral Tube size: 7.5 mm Number of attempts: 1 Airway Equipment and Method: Stylet Placement Confirmation: ETT inserted through vocal cords under direct vision,  breath sounds checked- equal and bilateral and positive ETCO2 Secured at: 23 cm Tube secured with: Tape Dental Injury: Teeth and Oropharynx as per pre-operative assessment

## 2015-01-18 NOTE — Discharge Instructions (Signed)
No weight on your Right leg  Keep your knee immobilizer on full time

## 2015-01-18 NOTE — Progress Notes (Signed)
Utilization review completed.  

## 2015-01-18 NOTE — Op Note (Signed)
01/17/2015 - 01/18/2015  4:07 PM  PATIENT:  Douglas [name]    PRE-OPERATIVE DIAGNOSIS:  Fractured right femur  POST-OPERATIVE DIAGNOSIS:  Same  PROCEDURE:  INTRAMEDULLARY (IM) RETROGRADE FEMORAL NAILING  SURGEON:  Dezi Schaner, D, MD  ASSISTANT: Joya Gaskins, OPA, He was necessary for efficiency and safety of the case.   ANESTHESIA:   gen  PREOPERATIVE INDICATIONS:  [name] is a  81 y.o. male with a diagnosis of Fractured right femur who failed conservative measures and elected for surgical management.    The risks benefits and alternatives were discussed with the patient preoperatively including but not limited to the risks of infection, bleeding, nerve injury, cardiopulmonary complications, the need for revision surgery, among others, and the patient was willing to proceed.  OPERATIVE IMPLANTS: Biomet pheonix nail 360x10.5  OPERATIVE FINDINGS: stable Nail  BLOOD LOSS: 417  COMPLICATIONS: none  TOURNIQUET TIME: none  OPERATIVE PROCEDURE:  Patient was identified in the preoperative holding area and site was marked by me He was transported to the operating theater and placed on the table in supine position taking care to pad all bony prominences. After a preincinduction time out anesthesia was induced. The right lower extremity was prepped and draped in normal sterile fashion and a pre-incision timeout was performed. He received ancef for preoperative antibiotics.   I made a medial parapatellar approach to the knee and incised just medial to the patella tendon retracted the patella laterally. I then inserted the guidewire at the tip of limits at slot centered between the condyles. I then reamed the entry portal into the intramedullary portion of the canal this point.  Next I passed the ball-tipped guidewire across the fracture. I reamed up to a 12 mm reamer and inserted a 10.5 supracondylar nail.  I fixed the 2 distalmost transverse and oblique interlocks distally  and then pulled traction to help reduce the fracture place the proximal anterior to posterior static interlock using a perfect circle technique  I then placed the other 2 distal interlocks and released retraction. As happy with the reduction.  I took multiple x-rays was happy with my reduction as well as the placement of all hardware I locked the nail distally so securing all the distal interlocks and thoroughly irrigated the wound and closed it in layers. A sterile dressing was applied he was placed in the immobilizer.  POST OPERATIVE PLAN: Nonweightbearing right lower extremity aspirin 325 daily for DVT prophylaxis and SCDs.    This note was generated using a template and dragon dictation system. In light of that, I have reviewed the note and all aspects of it are applicable to this case. Any dictation errors are due to the computerized dictation system.

## 2015-01-19 LAB — POTASSIUM
POTASSIUM: 5 mmol/L (ref 3.5–5.1)
Potassium: 6 mmol/L — ABNORMAL HIGH (ref 3.5–5.1)

## 2015-01-19 LAB — HEMOGLOBIN AND HEMATOCRIT, BLOOD
HCT: 26.1 % — ABNORMAL LOW (ref 39.0–52.0)
HEMOGLOBIN: 8.8 g/dL — AB (ref 13.0–17.0)

## 2015-01-19 LAB — GLUCOSE, CAPILLARY
GLUCOSE-CAPILLARY: 387 mg/dL — AB (ref 70–99)
GLUCOSE-CAPILLARY: 292 mg/dL — AB (ref 70–99)
GLUCOSE-CAPILLARY: 349 mg/dL — AB (ref 70–99)
Glucose-Capillary: 285 mg/dL — ABNORMAL HIGH (ref 70–99)
Glucose-Capillary: 306 mg/dL — ABNORMAL HIGH (ref 70–99)
Glucose-Capillary: 342 mg/dL — ABNORMAL HIGH (ref 70–99)
Glucose-Capillary: 397 mg/dL — ABNORMAL HIGH (ref 70–99)

## 2015-01-19 LAB — CBC
HEMATOCRIT: 24.3 % — AB (ref 39.0–52.0)
HEMOGLOBIN: 8 g/dL — AB (ref 13.0–17.0)
MCH: 29.7 pg (ref 26.0–34.0)
MCHC: 32.9 g/dL (ref 30.0–36.0)
MCV: 90.3 fL (ref 78.0–100.0)
Platelets: 77 10*3/uL — ABNORMAL LOW (ref 150–400)
RBC: 2.69 MIL/uL — ABNORMAL LOW (ref 4.22–5.81)
RDW: 13.7 % (ref 11.5–15.5)
WBC: 5.8 10*3/uL (ref 4.0–10.5)

## 2015-01-19 LAB — BASIC METABOLIC PANEL
ANION GAP: 9 (ref 5–15)
BUN: 42 mg/dL — ABNORMAL HIGH (ref 6–23)
CO2: 21 mmol/L (ref 19–32)
CREATININE: 3.66 mg/dL — AB (ref 0.50–1.35)
Calcium: 8 mg/dL — ABNORMAL LOW (ref 8.4–10.5)
Chloride: 105 mmol/L (ref 96–112)
GFR, EST AFRICAN AMERICAN: 17 mL/min — AB (ref >90)
GFR, EST NON AFRICAN AMERICAN: 14 mL/min — AB (ref >90)
Glucose, Bld: 408 mg/dL — ABNORMAL HIGH (ref 70–99)
POTASSIUM: 6.2 mmol/L — AB (ref 3.5–5.1)
Sodium: 135 mmol/L (ref 135–145)

## 2015-01-19 LAB — PREPARE RBC (CROSSMATCH)

## 2015-01-19 MED ORDER — BISACODYL 10 MG RE SUPP
10.0000 mg | Freq: Every day | RECTAL | Status: DC
  Administered 2015-01-21: 10 mg via RECTAL
  Filled 2015-01-19 (×2): qty 1

## 2015-01-19 MED ORDER — SODIUM POLYSTYRENE SULFONATE 15 GM/60ML PO SUSP
30.0000 g | Freq: Once | ORAL | Status: AC
Start: 2015-01-19 — End: 2015-01-19
  Administered 2015-01-19: 30 g via ORAL
  Filled 2015-01-19: qty 120

## 2015-01-19 MED ORDER — INSULIN DETEMIR 100 UNIT/ML ~~LOC~~ SOLN
30.0000 [IU] | Freq: Every day | SUBCUTANEOUS | Status: DC
  Administered 2015-01-19: 30 [IU] via SUBCUTANEOUS
  Filled 2015-01-19 (×2): qty 0.3

## 2015-01-19 MED ORDER — INSULIN ASPART 100 UNIT/ML ~~LOC~~ SOLN
0.0000 [IU] | Freq: Three times a day (TID) | SUBCUTANEOUS | Status: DC
  Administered 2015-01-19: 8 [IU] via SUBCUTANEOUS
  Administered 2015-01-19: 11 [IU] via SUBCUTANEOUS
  Administered 2015-01-20: 3 [IU] via SUBCUTANEOUS
  Administered 2015-01-20: 2 [IU] via SUBCUTANEOUS

## 2015-01-19 MED ORDER — INSULIN DETEMIR 100 UNIT/ML ~~LOC~~ SOLN
30.0000 [IU] | Freq: Every day | SUBCUTANEOUS | Status: DC
  Administered 2015-01-20 – 2015-01-21 (×2): 30 [IU] via SUBCUTANEOUS
  Filled 2015-01-19 (×2): qty 0.3

## 2015-01-19 MED ORDER — FUROSEMIDE 10 MG/ML IJ SOLN
20.0000 mg | Freq: Once | INTRAMUSCULAR | Status: AC
  Administered 2015-01-19: 20 mg via INTRAVENOUS
  Filled 2015-01-19: qty 2

## 2015-01-19 MED ORDER — DOCUSATE SODIUM 100 MG PO CAPS
200.0000 mg | ORAL_CAPSULE | Freq: Two times a day (BID) | ORAL | Status: DC
  Administered 2015-01-19 – 2015-01-20 (×3): 200 mg via ORAL
  Filled 2015-01-19 (×3): qty 2

## 2015-01-19 MED ORDER — DIPHENHYDRAMINE HCL 50 MG/ML IJ SOLN: 25.0000 mg | Freq: Four times a day (QID) | INTRAMUSCULAR | Status: DC | PRN

## 2015-01-19 MED ORDER — INSULIN ASPART 100 UNIT/ML ~~LOC~~ SOLN
0.0000 [IU] | Freq: Every day | SUBCUTANEOUS | Status: DC
  Administered 2015-01-19: 4 [IU] via SUBCUTANEOUS

## 2015-01-19 MED ORDER — SODIUM CHLORIDE 0.9 % IV SOLN
1.0000 g | Freq: Once | INTRAVENOUS | Status: AC
  Administered 2015-01-19: 1 g via INTRAVENOUS
  Filled 2015-01-19: qty 10

## 2015-01-19 MED ORDER — SODIUM CHLORIDE 0.9 % IV SOLN: Freq: Once | INTRAVENOUS | Status: DC

## 2015-01-19 MED ORDER — POLYETHYLENE GLYCOL 3350 17 G PO PACK
17.0000 g | PACK | Freq: Two times a day (BID) | ORAL | Status: DC
  Administered 2015-01-19 – 2015-01-21 (×5): 17 g via ORAL
  Filled 2015-01-19 (×7): qty 1

## 2015-01-19 MED ORDER — INSULIN ASPART 100 UNIT/ML ~~LOC~~ SOLN
4.0000 [IU] | Freq: Three times a day (TID) | SUBCUTANEOUS | Status: DC
  Administered 2015-01-19 – 2015-01-21 (×4): 4 [IU] via SUBCUTANEOUS

## 2015-01-19 MED ORDER — SODIUM CHLORIDE 0.9 % IV SOLN
INTRAVENOUS | Status: DC
  Administered 2015-01-19: 06:00:00 via INTRAVENOUS

## 2015-01-19 MED ORDER — ACETAMINOPHEN-CODEINE #3 300-30 MG PO TABS
1.0000 | ORAL_TABLET | ORAL | Status: DC | PRN
  Administered 2015-01-19 – 2015-01-21 (×5): 1 via ORAL
  Filled 2015-01-19 (×6): qty 1

## 2015-01-19 MED ORDER — SODIUM CHLORIDE 0.9 % IV SOLN: INTRAVENOUS | Status: AC

## 2015-01-19 NOTE — Progress Notes (Signed)
CBG at 0800 404. Dr. Candiss Norse made aware and ordered 20 units Novolog now. Will continue to monitor.

## 2015-01-19 NOTE — Progress Notes (Signed)
CRITICAL VALUE ALERT  Critical value received:  K 6.2  Date of notification:  1/23  Time of notification:  0500  Critical value read back:Yes.    Nurse who received alert:  Leia Alf RN  MD notified (1st page):  Kathline Magic NP ([name])  Time of first page:  0510  MD notified (2nd page):  Time of second page:  Responding MD:  Kathline Magic NP ([name])  Time MD responded:  [phone]

## 2015-01-19 NOTE — Progress Notes (Addendum)
Patient Demographics  Douglas [name], is a 81 y.o. male, DOB - 18-Jan-1931, IDP:[id number]  Admit date - 01/17/2015   Admitting Physician [name], MD  Outpatient Primary MD for the patient is [name],[name], MD  LOS - 2   Chief Complaint  Patient presents with  . Fall        Subjective:   Douglas [name] today has, No headache, No chest pain, No abdominal pain - No Nausea, No new weakness tingling or numbness, No Cough - SOB.    Assessment & Plan    1. Right closed Fracture, femur, distal -   Post Right femur intramedullary nailing on 01/18/2015 by Dr. [name], nonweightbearing right leg per orthopedics postop, aspirin 325 once a day along with SCDs for DVT prophylaxis per orthopedics, does have blood loss related anemia and will transfuse 1 unit of blood. Monitor H&H. PT. Social work for placement.    2. ARF on Chronic kidney disease stage IV. Creatinine at baseline which is around 2.5. Likely due to anemia, transfuse 1 unit packed RBC, avoid nephrotoxins and monitor EMP.    3. Essential hypertension. Resume home medications which are Norvasc and hydralazine.    4. Dyslipidemia. Resume statin.    5. DM type II. Poor glycemic control, adjusted Lantus, sliding scale and added pre-meal NovoLog.   CBG (last 3)   Recent Labs  01/19/15 0023 01/19/15 0214 01/19/15 0438  GLUCAP 397* 342* 387*     6. Hyperkalemia in the setting of acute renal failure. Kayexalate, fluid bolus with Lasix one time, initiate bowel regimen to augment GI losses of potassium, repeat potassium in 6 hours. Telemetry monitor.    7. Chronic thrombocytopenia. Slightly worse due to operative blood loss, will monitor closely.      Code Status: Full  Family Communication: wife  Disposition  Plan: TBD   Procedures   Right femur intramedullary nailing on 01/18/2015 by Dr. [name]    Consults  Ortho   Medications  Scheduled Meds: . sodium chloride   Intravenous Once  . amLODipine  5 mg Oral Daily  . aspirin EC  325 mg Oral Daily  . atorvastatin  40 mg Oral Daily  . furosemide  20 mg Intravenous Once  . gabapentin  600 mg Oral QHS  . hydrALAZINE  25 mg Oral TID  . insulin aspart  0-15 Units Subcutaneous TID WC  . insulin aspart  0-5 Units Subcutaneous QHS  . insulin aspart  4 Units Subcutaneous TID WC  . insulin detemir  30 Units Subcutaneous QHS   Continuous Infusions: . sodium chloride     PRN Meds:.diphenhydrAMINE, hydrALAZINE, HYDROcodone-acetaminophen, naLOXone (NARCAN)  injection, [DISCONTINUED] ondansetron **OR** ondansetron (ZOFRAN) IV  DVT Prophylaxis   ASA & SCDs    Lab Results  Component Value Date   PLT 77* 01/19/2015    Antibiotics    Anti-infectives    Start     Dose/Rate Route Frequency Ordered Stop   01/18/15 2000  ceFAZolin (ANCEF) IVPB 2 g/50 mL premix     2 g100 mL/hr over 30 Minutes Intravenous Every 6 hours 01/18/15 1638 01/19/15 0903   01/18/15 0900  ceFAZolin (ANCEF) IVPB 2 g/50 mL premix  Status:  Discontinued  2 g100 mL/hr over 30 Minutes Intravenous 3 times per day 01/18/15 0850 01/18/15 1821          Objective:   Filed Vitals:   01/19/15 0000 01/19/15 0206 01/19/15 0400 01/19/15 0435  BP:  151/52  133/52  Pulse:  86  81  Temp:  97.9 F (36.6 C)  98.1 F (36.7 C)  TempSrc:  Oral  Oral  Resp: 12 15 18 19   Height:      Weight:      SpO2: 96% 95% 96% 95%    Wt Readings from Last 3 Encounters:  01/17/15 65.318 kg (144 lb)  11/27/14 69.999 kg (154 lb 5.1 oz)  11/17/14 70 kg (154 lb 5.2 oz)     Intake/Output Summary (Last 24 hours) at 01/19/15 0908 Last data filed at 01/19/15 0650  Gross per 24 hour  Intake 3197.5 ml  Output    650 ml  Net 2547.5 ml     Physical Exam  Somnolent, No new F.N  deficits, Normal affect [place].AT,PERRAL Supple Neck,No JVD, No cervical lymphadenopathy appriciated.  Symmetrical Chest wall movement, Good air movement bilaterally, CTAB RRR,No Gallops,Rubs or new Murmurs, No Parasternal Heave +ve B.Sounds, Abd Soft, No tenderness, No organomegaly appriciated, No rebound - guarding or rigidity. No Cyanosis, Clubbing or edema, No new Rash or bruise      Data Review   Micro Results Recent Results (from the past 240 hour(s))  Surgical pcr screen     Status: None   Collection Time: 01/18/15  5:32 AM  Result Value Ref Range Status   MRSA, PCR NEGATIVE NEGATIVE Final   Staphylococcus aureus NEGATIVE NEGATIVE Final    Comment:        The Xpert SA Assay (FDA approved for NASAL specimens in patients over 18 years of age), is one component of a comprehensive surveillance program.  Test performance has been validated by [facility] for patients greater than or equal to 81 year old. It is not intended to diagnose infection nor to guide or monitor treatment.     Radiology Reports Dg Chest Portable 1 View  01/17/2015   CLINICAL DATA:  Pre-OP Rt knee FX. No current chest complaints. Hx HTN, diabetes, CA, nonsmoker  EXAM: PORTABLE CHEST - 1 VIEW  COMPARISON:  11/15/2014  FINDINGS: Low lung volumes. There is also elevation the right hemidiaphragm. Mild thinning and lung base opacity noted, greater on the right, most likely atelectasis. No convincing pneumonia. No pulmonary edema. No pleural effusion or pneumothorax.  Cardiac silhouette is normal in size and configuration. Normal mediastinal and hilar contours.  Bony thorax is demineralized but intact.  IMPRESSION: No acute cardiopulmonary disease.   Electronically Signed   By: Lajean Manes M.D.   On: 01/17/2015 19:09   Dg Knee Complete 4 Views Right  01/17/2015   CLINICAL DATA:  Fall  EXAM: RIGHT KNEE - COMPLETE 4+ VIEW  COMPARISON:  None  FINDINGS: Osseous demineralization.  Oblique displaced fracture distal  RIGHT femoral meta diaphysis, with distal fragment displaced medially and posteriorly.  Mild apex anterior angulation and overriding.  No articular extension.  Knee joint alignment normal.  No additional fracture, dislocation, or bone destruction.  Scattered vascular calcifications.  Old healed fracture of the proximal RIGHT fibular diaphysis.  Tiny cassette artifact noted on all images.  IMPRESSION: Displaced and angulated distal RIGHT femoral metadiaphyseal fracture.   Electronically Signed   By: Lavonia Dana M.D.   On: 01/17/2015 17:55   Dg C-arm 1-60 Min  01/18/2015   CLINICAL DATA:  ORIF.  EXAM: DG FEMUR 2+V*R*; DG C-ARM 61-120 MIN  COMPARISON:  None.  FINDINGS: Patient status post open reduction internal fixation of distal right femoral fracture. Near anatomic alignment. Fluoro time 61.4 seconds.  IMPRESSION: ORIF distal right femoral fracture with near anatomic alignment.   Electronically Signed   By: Marcello Moores  Register   On: 01/18/2015 16:35   Dg Femur, Min 2 Views Right  01/18/2015   CLINICAL DATA:  ORIF.  EXAM: DG FEMUR 2+V*R*; DG C-ARM 61-120 MIN  COMPARISON:  None.  FINDINGS: Patient status post open reduction internal fixation of distal right femoral fracture. Near anatomic alignment. Fluoro time 61.4 seconds.  IMPRESSION: ORIF distal right femoral fracture with near anatomic alignment.   Electronically Signed   By: Marcello Moores  Register   On: 01/18/2015 16:35     CBC  Recent Labs Lab 01/17/15 1827 01/19/15 0345  WBC 4.2 5.8  HGB 9.8* 8.0*  HCT 28.4* 24.3*  PLT 91* 77*  MCV 89.3 90.3  MCH 30.8 29.7  MCHC 34.5 32.9  RDW 13.4 13.7  LYMPHSABS 1.5  --   MONOABS 0.3  --   EOSABS 0.3  --   BASOSABS 0.1  --     Chemistries   Recent Labs Lab 01/17/15 1827 01/19/15 0345  NA 139 135  K 4.9 6.2*  CL 109 105  CO2 22 21  GLUCOSE 214* 408*  BUN 35* 42*  CREATININE 2.37* 3.66*  CALCIUM 8.4 8.0*    ------------------------------------------------------------------------------------------------------------------ estimated creatinine clearance is 12.7 mL/min (by C-G formula based on Cr of 3.66). ------------------------------------------------------------------------------------------------------------------ No results for input(s): HGBA1C in the last 72 hours. ------------------------------------------------------------------------------------------------------------------ No results for input(s): CHOL, HDL, LDLCALC, TRIG, CHOLHDL, LDLDIRECT in the last 72 hours. ------------------------------------------------------------------------------------------------------------------ No results for input(s): TSH, T4TOTAL, T3FREE, THYROIDAB in the last 72 hours.  Invalid input(s): FREET3 ------------------------------------------------------------------------------------------------------------------ No results for input(s): VITAMINB12, FOLATE, FERRITIN, TIBC, IRON, RETICCTPCT in the last 72 hours.  Coagulation profile No results for input(s): INR, PROTIME in the last 168 hours.  No results for input(s): DDIMER in the last 72 hours.  Cardiac Enzymes No results for input(s): CKMB, TROPONINI, MYOGLOBIN in the last 168 hours.  Invalid input(s): CK ------------------------------------------------------------------------------------------------------------------ Invalid input(s): POCBNP     Time Spent in minutes  35   Oluchi Pucci K M.D on 01/19/2015 at 9:08 AM  Between 7am to 7pm - Pager - [phone]  After 7pm go to [email or web address] - [facility] Office  [phone]

## 2015-01-19 NOTE — Progress Notes (Addendum)
Subjective: 1 Day Post-Op Procedure(s) (LRB): INTRAMEDULLARY (IM) RETROGRADE FEMORAL NAILING (Right) Patient reports pain as mild.  No nausea/vomiting, lightheadedness/dizziness, chest pain/sob.  Overall doing great without any complaints.  Objective: Vital signs in last 24 hours: Temp:  [97.9 F (36.6 C)-100 F (37.8 C)] 98.1 F (36.7 C) (01/23 0435) Pulse Rate:  [81-107] 81 (01/23 0435) Resp:  [12-20] 19 (01/23 0435) BP: (126-151)/(51-76) 143/68 mmHg (01/23 0939) SpO2:  [91 %-100 %] 95 % (01/23 0435)  Intake/Output from previous day: 01/22 0701 - 01/23 0700 In: 3197.5 [P.O.:270; I.V.:2717.5; IV Piggyback:210] Out: 650 [Urine:650] Intake/Output this shift:     Recent Labs  01/17/15 1827 01/19/15 0345  HGB 9.8* 8.0*    Recent Labs  01/17/15 1827 01/19/15 0345  WBC 4.2 5.8  RBC 3.18* 2.69*  HCT 28.4* 24.3*  PLT 91* 77*    Recent Labs  01/17/15 1827 01/19/15 0345 01/19/15 0806  NA 139 135  --   K 4.9 6.2* 6.0*  CL 109 105  --   CO2 22 21  --   BUN 35* 42*  --   CREATININE 2.37* 3.66*  --   GLUCOSE 214* 408*  --   CALCIUM 8.4 8.0*  --    No results for input(s): LABPT, INR in the last 72 hours.  Neurologically intact Neurovascular intact Sensation intact distally Intact pulses distally Dorsiflexion/Plantar flexion intact Compartment soft Knee immobilizer in place RLE No drainage noted through dressing under knee immobilizer  Assessment/Plan: 1 Day Post-Op Procedure(s) (LRB): INTRAMEDULLARY (IM) RETROGRADE FEMORAL NAILING (Right) Advance diet Up with therapy  NWB RLE Continue knee immobilizer at all times RLE Dry dressing change prn ABLA-will defer to medicine Continue plan per medicine  ANTON, M. Mendel Ryder 01/19/2015, 9:45 AM

## 2015-01-19 NOTE — Evaluation (Signed)
Physical Therapy Evaluation Patient Details Name: Douglas [name] MRN: [id number] DOB: 06-21-1931 Today's Date: 01/19/2015   History of Present Illness  Pt adm with lt distal femur fx. Underwent ORIF on 01/18/14. PMH - prostate CA, HTN, DM, ankle injuries bil.  Clinical Impression  Pt admitted with above diagnosis. Pt currently with functional limitations due to the deficits listed below (see PT Problem List).  Pt will benefit from skilled PT to increase their independence and safety with mobility to allow discharge to the venue listed below.  Pt will primarily be at a w/c level until his weight bearing restriction is over. Do not feel wife can assist him at this time. Pt/wife agreeable to ST[facility]SNF at [facility] preferred which is closer to home.     Follow Up Recommendations SNF    Equipment Recommendations  Wheelchair (measurements PT);Wheelchair cushion (measurements PT)    Recommendations for Other Services       Precautions / Restrictions Precautions Precautions: Fall Required Braces or Orthoses: Knee Immobilizer - Right Knee Immobilizer - Right: On at all times Restrictions RLE Weight Bearing: Non weight bearing      Mobility  Bed Mobility Overal bed mobility: Needs Assistance Bed Mobility: Supine to Sit     Supine to sit: Mod assist;HOB elevated     General bed mobility comments: Assist to bring legs over, trunk up and hips to EOB.  Transfers Overall transfer level: Needs assistance Equipment used: Rolling walker (2 wheeled) Transfers: Sit to/from [facility] Sit to Stand: Mod assist Stand pivot transfers: Max assist       General transfer comment: Verbal cues for hand placement. Assist to bring hips up and pt with difficulty maintaining NWB. Kept my foot under pt's rt foot to make sure he was not bearing weight. Pt unable to hop and difficulty making pivot with lt foot.  Ambulation/Gait                Stairs             Wheelchair Mobility    Modified Rankin (Stroke Patients Only)       Balance Overall balance assessment: Needs assistance Sitting-balance support: No upper extremity supported;Feet supported Sitting balance-Leahy Scale: Good     Standing balance support: Bilateral upper extremity supported Standing balance-Leahy Scale: Poor Standing balance comment: support of walker and mod A due to NWB on RLE.                             Pertinent Vitals/Pain Pain Assessment: No/denies pain    Home Living Family/patient expects to be discharged to:: Private residence Living Arrangements: Spouse/significant other Available Help at Discharge: Family;Available 24 hours/day Type of Home: House Home Access: Stairs to enter Entrance Stairs-Rails: Right;Left;Can reach both Entrance Stairs-Number of Steps: 6 Home Layout: One level Home Equipment: Cane - single point;Walker - 2 wheels      Prior Function Level of Independence: Independent               Hand Dominance        Extremity/Trunk Assessment   Upper Extremity Assessment: Defer to OT evaluation           Lower Extremity Assessment: Generalized weakness;RLE deficits/detail         Communication   Communication: No difficulties  Cognition Arousal/Alertness: Awake/alert Behavior During Therapy: WFL for tasks assessed/performed Overall Cognitive Status: Within Functional Limits for tasks assessed  Memory: Decreased short-term memory              General Comments      Exercises        Assessment/Plan    PT Assessment Patient needs continued PT services  PT Diagnosis Difficulty walking;Generalized weakness   PT Problem List Decreased strength;Decreased activity tolerance;Decreased balance;Decreased mobility;Decreased knowledge of use of DME;Decreased knowledge of precautions  PT Treatment Interventions DME instruction;Gait training;Functional mobility training;Therapeutic  activities;Therapeutic exercise;Patient/family education;Wheelchair mobility training;Balance training   PT Goals (Current goals can be found in the Care Plan section) Acute Rehab PT Goals Patient Stated Goal: Return home PT Goal Formulation: With patient/family Time For Goal Achievement: 02/02/15 Potential to Achieve Goals: Good    Frequency Min 5X/week   Barriers to discharge        Co-evaluation               End of Session Equipment Utilized During Treatment: Gait belt;Right knee immobilizer Activity Tolerance: Patient tolerated treatment well Patient left: in chair;with call bell/phone within reach;with family/visitor present Nurse Communication: Mobility status         Time: 8177-1165 PT Time Calculation (min) (ACUTE ONLY): 37 min   Charges:   PT Evaluation $Initial PT Evaluation Tier I: 1 Procedure PT Treatments $Gait Training: 8-22 mins   PT G Codes:        Everard Interrante 02-13-2015, 11:50 AM  Suanne Marker PT [phone]

## 2015-01-20 DIAGNOSIS — N179 Acute kidney failure, unspecified: Secondary | ICD-10-CM

## 2015-01-20 DIAGNOSIS — I10 Essential (primary) hypertension: Secondary | ICD-10-CM

## 2015-01-20 DIAGNOSIS — E875 Hyperkalemia: Secondary | ICD-10-CM

## 2015-01-20 LAB — BASIC METABOLIC PANEL
ANION GAP: 9 (ref 5–15)
BUN: 53 mg/dL — ABNORMAL HIGH (ref 6–23)
CHLORIDE: 106 mmol/L (ref 96–112)
CO2: 24 mmol/L (ref 19–32)
CREATININE: 3.43 mg/dL — AB (ref 0.50–1.35)
Calcium: 7.8 mg/dL — ABNORMAL LOW (ref 8.4–10.5)
GFR calc Af Amer: 18 mL/min — ABNORMAL LOW (ref >90)
GFR calc non Af Amer: 15 mL/min — ABNORMAL LOW (ref >90)
Glucose, Bld: 192 mg/dL — ABNORMAL HIGH (ref 70–99)
Potassium: 4.8 mmol/L (ref 3.5–5.1)
Sodium: 139 mmol/L (ref 135–145)

## 2015-01-20 LAB — TYPE AND SCREEN
ABO/RH(D): A POS
Antibody Screen: NEGATIVE
Unit division: 0

## 2015-01-20 LAB — GLUCOSE, CAPILLARY
GLUCOSE-CAPILLARY: 129 mg/dL — AB (ref 70–99)
GLUCOSE-CAPILLARY: 103 mg/dL — AB (ref 70–99)
Glucose-Capillary: 177 mg/dL — ABNORMAL HIGH (ref 70–99)
Glucose-Capillary: 267 mg/dL — ABNORMAL HIGH (ref 70–99)
Glucose-Capillary: 94 mg/dL (ref 70–99)

## 2015-01-20 LAB — CBC
HCT: 26.9 % — ABNORMAL LOW (ref 39.0–52.0)
Hemoglobin: 9.2 g/dL — ABNORMAL LOW (ref 13.0–17.0)
MCH: 30.2 pg (ref 26.0–34.0)
MCHC: 34.2 g/dL (ref 30.0–36.0)
MCV: 88.2 fL (ref 78.0–100.0)
Platelets: 85 10*3/uL — ABNORMAL LOW (ref 150–400)
RBC: 3.05 MIL/uL — ABNORMAL LOW (ref 4.22–5.81)
RDW: 14.5 % (ref 11.5–15.5)
WBC: 7.6 10*3/uL (ref 4.0–10.5)

## 2015-01-20 MED ORDER — SENNOSIDES-DOCUSATE SODIUM 8.6-50 MG PO TABS
2.0000 | ORAL_TABLET | Freq: Every day | ORAL | Status: DC
  Administered 2015-01-20: 2 via ORAL
  Filled 2015-01-20: qty 2

## 2015-01-20 NOTE — Clinical Social Work Placement (Addendum)
Clinical Social Work Department CLINICAL SOCIAL WORK PLACEMENT NOTE 01/20/2015  Patient:  Douglas [name], Douglas [name]  Account Number:  [id number] Admit date:  01/17/2015  Clinical Social Worker:  Carrington Clamp, [place]  Date/time:  01/20/2015 04:07 PM  Clinical Social Work is seeking post-discharge placement for this patient at the following level of care:   [facility]   (*CSW will update this form in Epic as items are completed)   01/20/2015  Patient/family provided with [place] Department of Clinical Social Work's list of facilities offering this level of care within the geographic area requested by the patient (or if unable, by the patient's family).  01/20/2015  Patient/family informed of their freedom to choose among providers that offer the needed level of care, that participate in Medicare, Medicaid or managed care program needed by the patient, have an available bed and are willing to accept the patient.  01/20/2015  Patient/family informed of [facility]' ownership interest in [facility], as well as of the fact that they are under no obligation to receive care at this facility.  PASARR submitted to EDS on 01/20/2015 PASARR number received on 01/20/2015  FL2 transmitted to all facilities in geographic area requested by pt/family on  01/20/2015 FL2 transmitted to all facilities within larger geographic area on   Patient informed that his/her managed care company has contracts with or will negotiate with  certain facilities, including the following:     Patient/family informed of bed offers received:  01/21/2015 [place]) Patient chooses bed at  Physician recommends and patient chooses bed at  [facility] [place] [facility])  Patient to be transferred to  on  01/22/2015 [place]) Patient to be transferred to facility by  Patient and family notified of transfer on 01/22/2015 Name of family member notified:  Patient, wife and daughter all agreeable to  dc plans  The following physician request were entered in Epic:   Additional Comments:  Chiropodist, [place] Weekend Clinical Social Worker [phone]

## 2015-01-20 NOTE — Progress Notes (Addendum)
PATIENT DETAILS Name: Douglas [name] Age: 81 y.o. Sex: male Date of Birth: 1931-03-30 Admit Date: 01/17/2015 Admitting Physician [name], MD OVF:IEPP,IRJJO B., MD  Brief narrative: 81 year old male with past medical history of chronic kidney disease stage IV, diabetes, hypertension was admitted following a mechanical fall, further workup demonstrated a right distal femur fracture, given multiple medical issues, patient was admitted by the hospitalist service, orthopedics was consulted. Patient underwent intramedullary femoral nailing on 1/22. Postoperative course was complicated by acute blood loss anemia, acute on chronic kidney disease and hyperkalemia. With PRBC transfusion, hemoglobin stable, patient was also provided with supportive care, renal function gradually improving. Plans are SNF on discharge.  Subjective: No major complaints this am.  Assessment/Plan: Principal Problem:   Fracture, femur, distal: Following a mechanical fall, orthopedics was consulted, patient underwent Right femur intramedullary nailing on 01/18/2015 by Dr. [name]. Current recommendations from orthopedics fornonweightbearing on right lower extremity,aspirin 325 once a day along with SCDs for VTE prophylaxis. Current plans are for SNF on discharge.  Active Problems:   Acute on chronic kidney disease stage IV: Acute renal failure likely secondary to prerenal azotemia from acute blood loss anemia. Creatinine slowly improving following PRBC transfusion. Continue to avoid nephrotoxic agents.    Acute blood loss anemia: Patient has anemia secondary to chronic kidney disease, acute worsening anemia likely from  perioperative blood loss. Hemoglobin stable post transfusion. Continue to monitor periodically.    Hyperkalemia: Likely secondary to worsening renal disease. This has resolved with IV fluids, Kayexalate. Will monitor electrolytes periodically.    Essential hypertension: Controlled with  Norvasc and hydralazine. Follow BP, titrate medications accordingly.    Dyslipidemia:continue statin.    DM type II: CBGs with moderate control, continue with Levemir, pre-meal NovoLog and SSI    Thrombocytopenia: Appears to be chronic, further workup deferred to the outpatient setting.  Disposition: Remain inpatient  Antibiotics:  See below   Anti-infectives    Start     Dose/Rate Route Frequency Ordered Stop   01/18/15 2000  ceFAZolin (ANCEF) IVPB 2 g/50 mL premix     2 g100 mL/hr over 30 Minutes Intravenous Every 6 hours 01/18/15 1638 01/19/15 0903   01/18/15 0900  ceFAZolin (ANCEF) IVPB 2 g/50 mL premix  Status:  Discontinued     2 g100 mL/hr over 30 Minutes Intravenous 3 times per day 01/18/15 0850 01/18/15 1821      DVT Prophylaxis: SCD's  Code Status: Full code   Family Communication Spouse at bedside  Procedures: Intramedullary femoral nailing on 1/22.  CONSULTS:  orthopedic surgery  Time spent 40 minutes-which includes 50% of the time with face-to-face with patient/ family and coordinating care related to the above assessment and plan.  MEDICATIONS: Scheduled Meds: . sodium chloride   Intravenous Once  . amLODipine  5 mg Oral Daily  . aspirin EC  325 mg Oral Daily  . atorvastatin  40 mg Oral Daily  . bisacodyl  10 mg Rectal Daily  . docusate sodium  200 mg Oral BID  . gabapentin  600 mg Oral QHS  . hydrALAZINE  25 mg Oral TID  . insulin aspart  0-15 Units Subcutaneous TID WC  . insulin aspart  0-5 Units Subcutaneous QHS  . insulin aspart  4 Units Subcutaneous TID WC  . insulin detemir  30 Units Subcutaneous Daily  . polyethylene glycol  17 g Oral BID   Continuous Infusions:  PRN Meds:.acetaminophen-codeine, diphenhydrAMINE, hydrALAZINE, naLOXone (NARCAN)  injection, [DISCONTINUED] ondansetron **OR** ondansetron (ZOFRAN) IV    PHYSICAL EXAM: Vital signs in last 24 hours: Filed Vitals:   01/19/15 1735 01/19/15 1800 01/19/15 2047 01/20/15 0618    BP: 149/66 140/67 144/57 151/68  Pulse: 95 99 95 82  Temp: 98.8 F (37.1 C) 98.5 F (36.9 C) 98.8 F (37.1 C) 98.4 F (36.9 C)  TempSrc: Oral Oral Oral Tympanic  Resp: 16 16 16 16   Height:      Weight:      SpO2:   94% 95%    Weight change:  Filed Weights   01/17/15 1714  Weight: 65.318 kg (144 lb)   Body mass index is 25.51 kg/(m^2).   Gen Exam: Awake and alert with clear speech.  Hard of hearing. Neck: Supple, No JVD.   Chest: B/L Clear.   CVS: S1 S2 Regular, no murmurs.  Abdomen: soft, BS +, non tender, non distended.  Extremities: no edema, lower extremities warm to touch. Neurologic: Non Focal.   Skin: No Rash.   Wounds: N/A.   Intake/Output from previous day:  Intake/Output Summary (Last 24 hours) at 01/20/15 1039 Last data filed at 01/20/15 0740  Gross per 24 hour  Intake   1155 ml  Output      0 ml  Net   1155 ml     LAB RESULTS: CBC  Recent Labs Lab 01/17/15 1827 01/19/15 0345 01/19/15 2230 01/20/15 0516  WBC 4.2 5.8  --  7.6  HGB 9.8* 8.0* 8.8* 9.2*  HCT 28.4* 24.3* 26.1* 26.9*  PLT 91* 77*  --  85*  MCV 89.3 90.3  --  88.2  MCH 30.8 29.7  --  30.2  MCHC 34.5 32.9  --  34.2  RDW 13.4 13.7  --  14.5  LYMPHSABS 1.5  --   --   --   MONOABS 0.3  --   --   --   EOSABS 0.3  --   --   --   BASOSABS 0.1  --   --   --     Chemistries   Recent Labs Lab 01/17/15 1827 01/19/15 0345 01/19/15 0806 01/19/15 1625 01/20/15 0516  NA 139 135  --   --  139  K 4.9 6.2* 6.0* 5.0 4.8  CL 109 105  --   --  106  CO2 22 21  --   --  24  GLUCOSE 214* 408*  --   --  192*  BUN 35* 42*  --   --  53*  CREATININE 2.37* 3.66*  --   --  3.43*  CALCIUM 8.4 8.0*  --   --  7.8*    CBG:  Recent Labs Lab 01/19/15 1257 01/19/15 1654 01/19/15 2006 01/20/15 0001 01/20/15 0615  GLUCAP 292* 306* 349* 267* 177*    GFR Estimated Creatinine Clearance: 13.6 mL/min (by C-G formula based on Cr of 3.43).  Coagulation profile No results for input(s): INR,  PROTIME in the last 168 hours.  Cardiac Enzymes No results for input(s): CKMB, TROPONINI, MYOGLOBIN in the last 168 hours.  Invalid input(s): CK  Invalid input(s): POCBNP No results for input(s): DDIMER in the last 72 hours. No results for input(s): HGBA1C in the last 72 hours. No results for input(s): CHOL, HDL, LDLCALC, TRIG, CHOLHDL, LDLDIRECT in the last 72 hours. No results for input(s): TSH, T4TOTAL, T3FREE, THYROIDAB in the last 72 hours.  Invalid input(s): FREET3 No results for input(s): VITAMINB12, FOLATE, FERRITIN, TIBC, IRON, RETICCTPCT in the  last 72 hours. No results for input(s): LIPASE, AMYLASE in the last 72 hours.  Urine Studies No results for input(s): UHGB, CRYS in the last 72 hours.  Invalid input(s): UACOL, UAPR, USPG, UPH, UTP, UGL, UKET, UBIL, UNIT, UROB, ULEU, UEPI, UWBC, URBC, UBAC, CAST, UCOM, BILUA  MICROBIOLOGY: Recent Results (from the past 240 hour(s))  Surgical pcr screen     Status: None   Collection Time: 01/18/15  5:32 AM  Result Value Ref Range Status   MRSA, PCR NEGATIVE NEGATIVE Final   Staphylococcus aureus NEGATIVE NEGATIVE Final    Comment:        The Xpert SA Assay (FDA approved for NASAL specimens in patients over 4 years of age), is one component of a comprehensive surveillance program.  Test performance has been validated by [facility] for patients greater than or equal to 24 year old. It is not intended to diagnose infection nor to guide or monitor treatment.     RADIOLOGY STUDIES/RESULTS: Dg Chest Portable 1 View  01/17/2015   CLINICAL DATA:  Pre-OP Rt knee FX. No current chest complaints. Hx HTN, diabetes, CA, nonsmoker  EXAM: PORTABLE CHEST - 1 VIEW  COMPARISON:  11/15/2014  FINDINGS: Low lung volumes. There is also elevation the right hemidiaphragm. Mild thinning and lung base opacity noted, greater on the right, most likely atelectasis. No convincing pneumonia. No pulmonary edema. No pleural effusion or pneumothorax.   Cardiac silhouette is normal in size and configuration. Normal mediastinal and hilar contours.  Bony thorax is demineralized but intact.  IMPRESSION: No acute cardiopulmonary disease.   Electronically Signed   By: Lajean Manes M.D.   On: 01/17/2015 19:09   Dg Knee Complete 4 Views Right  01/17/2015   CLINICAL DATA:  Fall  EXAM: RIGHT KNEE - COMPLETE 4+ VIEW  COMPARISON:  None  FINDINGS: Osseous demineralization.  Oblique displaced fracture distal RIGHT femoral meta diaphysis, with distal fragment displaced medially and posteriorly.  Mild apex anterior angulation and overriding.  No articular extension.  Knee joint alignment normal.  No additional fracture, dislocation, or bone destruction.  Scattered vascular calcifications.  Old healed fracture of the proximal RIGHT fibular diaphysis.  Tiny cassette artifact noted on all images.  IMPRESSION: Displaced and angulated distal RIGHT femoral metadiaphyseal fracture.   Electronically Signed   By: Lavonia Dana M.D.   On: 01/17/2015 17:55   Dg C-arm 1-60 Min  01/18/2015   CLINICAL DATA:  ORIF.  EXAM: DG FEMUR 2+V*R*; DG C-ARM 61-120 MIN  COMPARISON:  None.  FINDINGS: Patient status post open reduction internal fixation of distal right femoral fracture. Near anatomic alignment. Fluoro time 61.4 seconds.  IMPRESSION: ORIF distal right femoral fracture with near anatomic alignment.   Electronically Signed   By: Marcello Moores  Register   On: 01/18/2015 16:35   Dg Femur, Min 2 Views Right  01/18/2015   CLINICAL DATA:  ORIF.  EXAM: DG FEMUR 2+V*R*; DG C-ARM 61-120 MIN  COMPARISON:  None.  FINDINGS: Patient status post open reduction internal fixation of distal right femoral fracture. Near anatomic alignment. Fluoro time 61.4 seconds.  IMPRESSION: ORIF distal right femoral fracture with near anatomic alignment.   Electronically Signed   By: Marcello Moores  Register   On: 01/18/2015 16:35    Oren Binet, MD  [name] Pager:[phone]  If 7PM-7AM, please contact  night-coverage [email or web address] Password [email or web address] 01/20/2015, 10:39 AM   LOS: 3 days

## 2015-01-20 NOTE — Clinical Social Work Psychosocial (Signed)
Clinical Social Work Department BRIEF PSYCHOSOCIAL ASSESSMENT 01/20/2015  Patient:  Douglas [name], Douglas [name]     Account Number:  [id number]     Admit date:  01/17/2015  Clinical Social Worker:  Hubert Azure  Date/Time:  01/20/2015 02:56 PM  Referred by:  Physician  Date Referred:  01/20/2015 Referred for  SNF Placement   Other Referral:   Interview type:  Other - See comment Other interview type:   Patient's wife present at bedside.    PSYCHOSOCIAL DATA Living Status:  WIFE Admitted from facility:   Level of care:   Primary support name:  Douglas [name] [phone]) Primary support relationship to patient:  SPOUSE Degree of support available:   Good    CURRENT CONCERNS Current Concerns  Post-Acute Placement   Other Concerns:    SOCIAL WORK ASSESSMENT / PLAN CSW met with patient's wife who was present at bedside as patient was asleep. CSW introduced self and explained role. CSW explained SNF placement process and discussed d/c plan. Per wife, patient has crutches, a walker, and a cane at home, but rarely uses them. Wife is agreeable to SNF placement and prefers [facility]. Per wife, she does not do a lot of driving, so [facility] would be most convenient for her traveling. Wife states she is agreeable to EMS transport only if "insurance covers it."   Assessment/plan status:  Other - See comment Other assessment/ plan:   CSW to submit PASARR and complete FL2 for placement.   Information/referral to community resources:    PATIENT'S/FAMILY'S RESPONSE TO PLAN OF CARE: Patient's wife was cooperative and pleasant. Patient's wife expressed a sincere desire to see patient benefit from SNF.    [place], [place] Weekend Clinical Social Worker [phone]

## 2015-01-20 NOTE — Progress Notes (Signed)
OT Cancellation Note  Patient Details Name: Douglas [name] MRN: [id number] DOB: Mar 29, 1931   Cancelled Treatment:    Reason Eval/Treat Not Completed: Other (comment) Pt is Medicare and current D/C plan is SNF. No apparent immediate acute care OT needs, therefore will defer OT to SNF. If OT eval is needed please call Acute Rehab Dept. at [phone] or text page OT at [phone].    Villa Herb M   Cyndie Chime, OTR/L Occupational Therapist [phone] (pager)  01/20/2015, 7:59 AM

## 2015-01-20 NOTE — Progress Notes (Signed)
Subjective: 2 Days Post-Op Procedure(s) (LRB): INTRAMEDULLARY (IM) RETROGRADE FEMORAL NAILING (Right) Patient reports pain as mild.  Mild lightheadedness/dizziness.  No nausea/vomiting, chest pain/sob.  Patient having trouble with not having a bm.    Objective: Vital signs in last 24 hours: Temp:  [98.2 F (36.8 C)-98.8 F (37.1 C)] 98.4 F (36.9 C) (01/24 0618) Pulse Rate:  [80-99] 82 (01/24 0618) Resp:  [16-18] 16 (01/24 0618) BP: (140-151)/(53-68) 151/68 mmHg (01/24 0618) SpO2:  [94 %-95 %] 95 % (01/24 0618)  Intake/Output from previous day: 01/23 0701 - 01/24 0700 In: 1155 [P.O.:720; I.V.:100; Blood:335] Out: -  Intake/Output this shift: Total I/O In: 240 [P.O.:240] Out: -    Recent Labs  01/17/15 1827 01/19/15 0345 01/19/15 2230 01/20/15 0516  HGB 9.8* 8.0* 8.8* 9.2*    Recent Labs  01/19/15 0345 01/19/15 2230 01/20/15 0516  WBC 5.8  --  7.6  RBC 2.69*  --  3.05*  HCT 24.3* 26.1* 26.9*  PLT 77*  --  85*    Recent Labs  01/19/15 0345  01/19/15 1625 01/20/15 0516  NA 135  --   --  139  K 6.2*  < > 5.0 4.8  CL 105  --   --  106  CO2 21  --   --  24  BUN 42*  --   --  53*  CREATININE 3.66*  --   --  3.43*  GLUCOSE 408*  --   --  192*  CALCIUM 8.0*  --   --  7.8*  < > = values in this interval not displayed. No results for input(s): LABPT, INR in the last 72 hours.  Neurologically intact Neurovascular intact Sensation intact distally Intact pulses distally Dorsiflexion/Plantar flexion intact Compartment soft No drainage noted through dressing Knee immobilizer in place  Assessment/Plan: 2 Days Post-Op Procedure(s) (LRB): INTRAMEDULLARY (IM) RETROGRADE FEMORAL NAILING (Right) Advance diet Up with therapy  NWB RLE Dry dressing change prn Continue plan per medicine  ANTON, M. Mendel Ryder 01/20/2015, 9:56 AM

## 2015-01-21 ENCOUNTER — Encounter (HOSPITAL_COMMUNITY): Payer: Self-pay | Admitting: Orthopedic Surgery

## 2015-01-21 ENCOUNTER — Inpatient Hospital Stay
Admission: RE | Admit: 2015-01-21 | Discharge: 2015-02-02 | Disposition: A | Discharge status: Nursing Home (Includes ICF) | Payer: Medicare Other | Source: Ambulatory Visit | Attending: Internal Medicine | Admitting: Internal Medicine

## 2015-01-21 DIAGNOSIS — R0989 Other specified symptoms and signs involving the circulatory and respiratory systems: Principal | ICD-10-CM

## 2015-01-21 LAB — GLUCOSE, CAPILLARY
GLUCOSE-CAPILLARY: 111 mg/dL — AB (ref 70–99)
GLUCOSE-CAPILLARY: 99 mg/dL (ref 70–99)
Glucose-Capillary: 84 mg/dL (ref 70–99)

## 2015-01-21 LAB — BASIC METABOLIC PANEL
ANION GAP: 8 (ref 5–15)
BUN: 56 mg/dL — AB (ref 6–23)
CO2: 25 mmol/L (ref 19–32)
Calcium: 7.8 mg/dL — ABNORMAL LOW (ref 8.4–10.5)
Chloride: 108 mmol/L (ref 96–112)
Creatinine, Ser: 3.2 mg/dL — ABNORMAL HIGH (ref 0.50–1.35)
GFR calc Af Amer: 19 mL/min — ABNORMAL LOW (ref >90)
GFR, EST NON AFRICAN AMERICAN: 17 mL/min — AB (ref >90)
GLUCOSE: 84 mg/dL (ref 70–99)
Potassium: 4.8 mmol/L (ref 3.5–5.1)
Sodium: 141 mmol/L (ref 135–145)

## 2015-01-21 MED ORDER — GABAPENTIN 300 MG PO CAPS
300.0000 mg | ORAL_CAPSULE | Freq: Every day | ORAL | Status: DC
  Filled 2015-01-21: qty 1

## 2015-01-21 MED ORDER — SENNOSIDES-DOCUSATE SODIUM 8.6-50 MG PO TABS: 2.0000 | ORAL_TABLET | Freq: Every day | ORAL | Status: DC

## 2015-01-21 MED ORDER — POLYETHYLENE GLYCOL 3350 17 G PO PACK: 17.0000 g | PACK | Freq: Two times a day (BID) | ORAL | Status: DC

## 2015-01-21 MED ORDER — HYDROCODONE-ACETAMINOPHEN 5-325 MG PO TABS: 1.0000 | ORAL_TABLET | Freq: Four times a day (QID) | ORAL | Status: DC | PRN

## 2015-01-21 MED ORDER — GABAPENTIN 300 MG PO CAPS: 300.0000 mg | ORAL_CAPSULE | Freq: Every day | ORAL | Status: DC

## 2015-01-21 MED ORDER — INSULIN ASPART 100 UNIT/ML ~~LOC~~ SOLN
SUBCUTANEOUS | Status: AC
Start: 2015-01-21 — End: ?

## 2015-01-21 NOTE — Progress Notes (Signed)
Patient ID: Douglas [name], male   DOB: May 25, 1931, 81 y.o.   MRN: [id number]     Subjective:  Patient reports pain as mild to moderate.  Patient states that he is very sleepy all the time   Objective:   VITALS:   Filed Vitals:   01/20/15 1523 01/20/15 1928 01/21/15 0529 01/21/15 1433  BP: 125/68 155/71 164/66 148/75  Pulse: 78 92 87 78  Temp: 98.3 F (36.8 C) 99.8 F (37.7 C) 98.9 F (37.2 C) 98.2 F (36.8 C)  TempSrc: Oral Oral Oral   Resp: 18 16 16 16   Height:      Weight:      SpO2: 93% 92% 94% 93%    ABD soft Sensation intact distally Dorsiflexion/Plantar flexion intact Incision: dressing C/D/I and scant drainage   Lab Results  Component Value Date   WBC 7.6 01/20/2015   HGB 9.2* 01/20/2015   HCT 26.9* 01/20/2015   MCV 88.2 01/20/2015   PLT 85* 01/20/2015     Assessment/Plan: 3 Days Post-Op   Principal Problem:   Fracture, femur, distal Active Problems:   Hypertension   Hyperkalemia   Acute renal failure   Diabetes   CKD (chronic kidney disease) stage 4, GFR 15-29 ml/min   Anemia, chronic disease   Advance diet Up with therapy Discharge to SNF  Continue plan per medicine nwb right lower ext Follow up with Dr Edmonia Lynch in two weeks   Remonia Richter 01/21/2015, 4:42 PM   Edmonia Lynch MD [phone]

## 2015-01-21 NOTE — Progress Notes (Signed)
Patient d/c to SNF.  IV removed this am.  Report called to SNF.

## 2015-01-21 NOTE — Clinical Social Work Note (Addendum)
Patient to discharge today per MD. Patient to discharge today to [facility] RN to call report to [phone] Transportation: [facility]- after 2pm    12:17pm- CSW received a call from [facility] who reports they WERE able to admit patient today.  CSW made patient, wife and daughter aware.  All are agreeable to this discharge.    11:47am- CSW met with patient, wife and daughter at bedside to review bed offers.  Patient and family requested [facility] of which could not offer a bed at this time.  Family made aware.  Second choice is either [facility] or [facility].  Wife and daughter will contact CSW with SNF back-up option.  Wife and daughter requests a private-duty nursing list be provided to determine the financial expense to have assistance during the day.  Wife will be with the patient 24/7 and daughters can provide evening supervision as well.  RNCM contacted and agreeable to provide such list.  Nonnie Done, [place] [phone]  Psychiatric & Orthopedics (5N 1-16) Clinical Social Worker

## 2015-01-21 NOTE — Discharge Summary (Signed)
PATIENT DETAILS Name: Douglas [name] Age: 81 y.o. Sex: male Date of Birth: [birth year] MRN: [id number]. Admitting Physician: [name], MD IPJ:[id number],NLZJQ B., MD  Admit Date: 01/17/2015 Discharge date: 01/21/2015  Recommendations for Outpatient Follow-up:  1. Please check CBC and BMET in 1 week  PRIMARY DISCHARGE DIAGNOSIS:  Principal Problem:   Fracture, femur, distal Active Problems:   Hypertension   Hyperkalemia   Acute renal failure   Diabetes   CKD (chronic kidney disease) stage 4, GFR 15-29 ml/min   Anemia, chronic disease      PAST MEDICAL HISTORY: Past Medical History  Diagnosis Date  . Hypertension   . Diabetes mellitus   . Hyperlipidemia   . Cataracts, bilateral   . Hyperkalemia   . UTI (lower urinary tract infection)   . Calculus of kidney   . Sensorineural hearing loss, unspecified   . Epiglottitis   . Chronic ankle pain   . Neuromuscular disorder     neurophaty lower legs  . Cancer     prostate and had radiation done    DISCHARGE MEDICATIONS: Current Discharge Medication List    START taking these medications   Details  aspirin EC 325 MG tablet Take 1 tablet (325 mg total) by mouth daily. Qty: 30 tablet, Refills: 0    HYDROcodone-acetaminophen (NORCO) 5-325 MG per tablet Take 1-2 tablets by mouth every 6 (six) hours as needed. Qty: 30 tablet, Refills: 0    polyethylene glycol (MIRALAX / GLYCOLAX) packet Take 17 g by mouth 2 (two) times daily. Qty: 14 each, Refills: 0    senna-docusate (SENOKOT-S) 8.6-50 MG per tablet Take 2 tablets by mouth at bedtime.      CONTINUE these medications which have CHANGED   Details  gabapentin (NEURONTIN) 300 MG capsule Take 1 capsule (300 mg total) by mouth at bedtime.    insulin aspart (NOVOLOG) 100 UNIT/ML injection insulin aspart (novoLOG) injection 0-15 Units 0-15 Units, Subcutaneous, 3 times daily with meals CBG 70 - 120: 0 units CBG 121 - 150: 2 units CBG 151 - 200: 3 units CBG 201 - 250:  5 units CBG 251 - 300: 8 units CBG 301 - 350: 11 units CBG 351 - 400: 15 units CBG > 400: call MD Qty: 10 mL, Refills: 11      CONTINUE these medications which have NOT CHANGED   Details  amLODipine (NORVASC) 5 MG tablet Take 1 tablet (5 mg total) by mouth daily. Qty: 30 tablet, Refills: 1    atorvastatin (LIPITOR) 40 MG tablet Take 40 mg by mouth daily.    insulin detemir (LEVEMIR) 100 UNIT/ML injection Inject 0.25 mLs (25 Units total) into the skin at bedtime. Qty: 10 mL, Refills: 11    hydrALAZINE (APRESOLINE) 25 MG tablet Take 1 tablet (25 mg total) by mouth 3 (three) times daily. Qty: 90 tablet, Refills: 1    ketorolac (ACULAR) 0.5 % ophthalmic solution Place 1 drop into both eyes 3 (three) times daily.        ALLERGIES:  No Known Allergies  BRIEF HPI:  See H&P, Labs, Consult and Test reports for all details in brief, 81 year old male with past medical history of chronic kidney disease stage IV, diabetes, hypertension was admitted following a mechanical fall, further workup demonstrated a right distal femur fracture  CONSULTATIONS:   orthopedic surgery  PERTINENT RADIOLOGIC STUDIES: Dg Chest Portable 1 View  01/17/2015   CLINICAL DATA:  Pre-OP Rt knee FX. No current chest complaints. Hx HTN, diabetes, CA,  nonsmoker  EXAM: PORTABLE CHEST - 1 VIEW  COMPARISON:  11/15/2014  FINDINGS: Low lung volumes. There is also elevation the right hemidiaphragm. Mild thinning and lung base opacity noted, greater on the right, most likely atelectasis. No convincing pneumonia. No pulmonary edema. No pleural effusion or pneumothorax.  Cardiac silhouette is normal in size and configuration. Normal mediastinal and hilar contours.  Bony thorax is demineralized but intact.  IMPRESSION: No acute cardiopulmonary disease.   Electronically Signed   By: Lajean Manes M.D.   On: 01/17/2015 19:09   Dg Knee Complete 4 Views Right  01/17/2015   CLINICAL DATA:  Fall  EXAM: RIGHT KNEE - COMPLETE 4+ VIEW   COMPARISON:  None  FINDINGS: Osseous demineralization.  Oblique displaced fracture distal RIGHT femoral meta diaphysis, with distal fragment displaced medially and posteriorly.  Mild apex anterior angulation and overriding.  No articular extension.  Knee joint alignment normal.  No additional fracture, dislocation, or bone destruction.  Scattered vascular calcifications.  Old healed fracture of the proximal RIGHT fibular diaphysis.  Tiny cassette artifact noted on all images.  IMPRESSION: Displaced and angulated distal RIGHT femoral metadiaphyseal fracture.   Electronically Signed   By: Lavonia Dana M.D.   On: 01/17/2015 17:55   Dg C-arm 1-60 Min  01/18/2015   CLINICAL DATA:  ORIF.  EXAM: DG FEMUR 2+V*R*; DG C-ARM 61-120 MIN  COMPARISON:  None.  FINDINGS: Patient status post open reduction internal fixation of distal right femoral fracture. Near anatomic alignment. Fluoro time 61.4 seconds.  IMPRESSION: ORIF distal right femoral fracture with near anatomic alignment.   Electronically Signed   By: Marcello Moores  Register   On: 01/18/2015 16:35   Dg Femur, Min 2 Views Right  01/18/2015   CLINICAL DATA:  ORIF.  EXAM: DG FEMUR 2+V*R*; DG C-ARM 61-120 MIN  COMPARISON:  None.  FINDINGS: Patient status post open reduction internal fixation of distal right femoral fracture. Near anatomic alignment. Fluoro time 61.4 seconds.  IMPRESSION: ORIF distal right femoral fracture with near anatomic alignment.   Electronically Signed   By: Marcello Moores  Register   On: 01/18/2015 16:35     PERTINENT LAB RESULTS: CBC:  Recent Labs  01/19/15 0345 01/19/15 2230 01/20/15 0516  WBC 5.8  --  7.6  HGB 8.0* 8.8* 9.2*  HCT 24.3* 26.1* 26.9*  PLT 77*  --  85*   CMET CMP     Component Value Date/Time   NA 141 01/21/2015 0517   K 4.8 01/21/2015 0517   CL 108 01/21/2015 0517   CO2 25 01/21/2015 0517   GLUCOSE 84 01/21/2015 0517   BUN 56* 01/21/2015 0517   CREATININE 3.20* 01/21/2015 0517   CALCIUM 7.8* 01/21/2015 0517    CALCIUM 7.9* 04/21/2014 0616   PROT 7.0 11/16/2014 0521   ALBUMIN 3.3* 11/16/2014 0521   AST 22 11/16/2014 0521   ALT 14 11/16/2014 0521   ALKPHOS 78 11/16/2014 0521   BILITOT 0.3 11/16/2014 0521   GFRNONAA 17* 01/21/2015 0517   GFRAA 19* 01/21/2015 0517    GFR Estimated Creatinine Clearance: 14.6 mL/min (by C-G formula based on Cr of 3.2). No results for input(s): LIPASE, AMYLASE in the last 72 hours. No results for input(s): CKTOTAL, CKMB, CKMBINDEX, TROPONINI in the last 72 hours. Invalid input(s): POCBNP No results for input(s): DDIMER in the last 72 hours. No results for input(s): HGBA1C in the last 72 hours. No results for input(s): CHOL, HDL, LDLCALC, TRIG, CHOLHDL, LDLDIRECT in the last 72 hours. No results  for input(s): TSH, T4TOTAL, T3FREE, THYROIDAB in the last 72 hours.  Invalid input(s): FREET3 No results for input(s): VITAMINB12, FOLATE, FERRITIN, TIBC, IRON, RETICCTPCT in the last 72 hours. Coags: No results for input(s): INR in the last 72 hours.  Invalid input(s): PT Microbiology: Recent Results (from the past 240 hour(s))  Surgical pcr screen     Status: None   Collection Time: 01/18/15  5:32 AM  Result Value Ref Range Status   MRSA, PCR NEGATIVE NEGATIVE Final   Staphylococcus aureus NEGATIVE NEGATIVE Final    Comment:        The Xpert SA Assay (FDA approved for NASAL specimens in patients over 68 years of age), is one component of a comprehensive surveillance program.  Test performance has been validated by [facility] for patients greater than or equal to 34 year old. It is not intended to diagnose infection nor to guide or monitor treatment.      BRIEF HOSPITAL COURSE:  Brief narrative: 81 year old male with past medical history of chronic kidney disease stage IV, diabetes, hypertension was admitted following a mechanical fall, further workup demonstrated a right distal femur fracture, given multiple medical issues, patient was admitted by the  hospitalist service, orthopedics was consulted. Patient underwent intramedullary femoral nailing on 1/22. Postoperative course was complicated by acute blood loss anemia, acute on chronic kidney disease and hyperkalemia. With PRBC transfusion remained hemoglobin stable, patient was also provided with supportive care,IVF wit these measures renal function gradually improved.   See below for hospital course by problem list.  Fracture, femur, distal: Following a mechanical fall, orthopedics was consulted, patient underwent Right femur intramedullary nailing on 01/18/2015 by Dr. [name]. Current recommendations from orthopedics fornonweightbearing on right lower extremity,aspirin 325 once a day along with SCDs for VTE prophylaxis. Current plans are for SNF on discharge.  Active Problems:  Acute on chronic kidney disease stage IV: Acute renal failure likely secondary to prerenal azotemia from acute blood loss anemia. Creatinine continues to improved with just observation, by day of discharge, creatinine down to 3.20 (baseline 2.6-2.9) from a peak of 3.55. Continue to avoid nephrotoxic agents.Please check electrolytes in 1 week.   Acute blood loss anemia: Patient has anemia secondary to chronic kidney disease, acute worsening anemia likely from perioperative blood loss. Hemoglobin stable post transfusion,last Hb on 11/24 was 9.2. Continue to monitor periodically.   Hyperkalemia: Likely secondary to worsening renal disease. This has resolved with IV fluids, Kayexalate.    Essential hypertension: Controlled with Norvasc and hydralazine. Follow BP, titrate medications accordingly.   Dyslipidemia:continue statin.   DM type II: CBGs with moderate control, continue with Levemi and SSI   Thrombocytopenia: Appears to be chronic, further workup deferred to the outpatient setting.   TODAY-DAY OF DISCHARGE:  Subjective:   Douglas [name] today has no headache,no chest abdominal pain,no new weakness  tingling or numbness, feels much better wants to go home today.   Objective:   Blood pressure 164/66, pulse 87, temperature 98.9 F (37.2 C), temperature source Oral, resp. rate 16, height 5\' 3"  (1.6 m), weight 65.318 kg (144 lb), SpO2 94 %.  Intake/Output Summary (Last 24 hours) at 01/21/15 5638 Last data filed at 01/21/15 0537  Gross per 24 hour  Intake    240 ml  Output    350 ml  Net   -110 ml   Filed Weights   01/17/15 1714  Weight: 65.318 kg (144 lb)    Exam Awake Alert, Oriented *3, No new F.N deficits,  Normal affect [place].AT,PERRAL Supple Neck,No JVD, No cervical lymphadenopathy appriciated.  Symmetrical Chest wall movement, Good air movement bilaterally, CTAB RRR,No Gallops,Rubs or new Murmurs, No Parasternal Heave +ve B.Sounds, Abd Soft, Non tender, No organomegaly appriciated, No rebound -guarding or rigidity. No Cyanosis, Clubbing or edema, No new Rash or bruise  DISCHARGE CONDITION: Stable  DISPOSITION: SNF  DISCHARGE INSTRUCTIONS:    Activity:  Non weight bearing to RLE  Diet recommendation: Diabetic Diet Heart Healthy diet  Discharge Instructions    Call MD for:  redness, tenderness, or signs of infection (pain, swelling, redness, odor or green/yellow discharge around incision site)    Complete by:  As directed      Diet - low sodium heart healthy    Complete by:  As directed      Increase activity slowly    Complete by:  As directed      Non weight bearing    Complete by:  As directed            Follow-up Information    Follow up with MURPHY, TIMOTHY, D, MD In 1 week.   Specialty:  Orthopedic Surgery   Contact information:   [address] [place] [address] [phone]       Follow up with [name],[name], MD. Schedule an appointment as soon as possible for a visit in 2 weeks.   Specialty:  Internal Medicine   Contact information:   [place] [address] [phone]      Total Time spent on discharge  equals 45 minutes.  SignedOren Binet 01/21/2015 8:22 AM

## 2015-01-21 NOTE — Progress Notes (Signed)
Physical Therapy Treatment Patient Details Name: Douglas [name] MRN: [id number] DOB: [birth year] Today's Date: 01/21/2015    History of Present Illness Pt adm with rt distal femur fx. Underwent ORIF on 01/18/14. PMH - prostate CA, HTN, DM, ankle injuries bil.    PT Comments    Pt. Not easy to arouse this session. Once he sat on EOB, his awareness improved. Sit to stand transfer was max A and unable to practice the squat pivot. Nursing needed him in the bed to administer medicine so he would be ready for d/c to SNF today. Pt. Will benefit from ongoing therapy at his next placement to gain independence in mobility.  Follow Up Recommendations  SNF     Equipment Recommendations  Wheelchair (measurements PT);Wheelchair cushion (measurements PT)    Recommendations for Other Services       Precautions / Restrictions Precautions Precautions: Fall Required Braces or Orthoses: Knee Immobilizer - Right Knee Immobilizer - Right: On at all times Restrictions Weight Bearing Restrictions: Yes RLE Weight Bearing: Non weight bearing    Mobility  Bed Mobility Overal bed mobility: Needs Assistance Bed Mobility: Supine to Sit     Supine to sit: Mod assist;HOB elevated     General bed mobility comments: Assist to bring legs over, trunk up and hips to EOB.  Transfers Overall transfer level: Needs assistance Equipment used: Rolling walker (2 wheeled) Transfers: Sit to/from Stand Sit to Stand: Max assist;+2 physical assistance         General transfer comment: VERbal cues for hand placement on rails. Assist to bring hips up. Left side buckling even with max assist. Kept my foot under pt's to make sure he was not bearing weight. Unable to do a stand pivot transfer due to lethargy.  Ambulation/Gait                 Stairs            Wheelchair Mobility    Modified Rankin (Stroke Patients Only)       Balance Overall balance assessment: Needs  assistance Sitting-balance support: Bilateral upper extremity supported;Feet supported Sitting balance-Leahy Scale: Poor Sitting balance - Comments: pt. was unable to support himself without rails and assistance from [place]. leaned posterior and unable grip with right hand. Trying to use walker, vc's using bed. Postural control: Posterior lean;Left lateral lean Standing balance support: Bilateral upper extremity supported;During functional activity Standing balance-Leahy Scale: Poor Standing balance comment: support of walker and max A due to NWB on RLE.                    Cognition Arousal/Alertness: Lethargic Behavior During Therapy: WFL for tasks assessed/performed Overall Cognitive Status: Within Functional Limits for tasks assessed       Memory: Decreased short-term memory              Exercises      General Comments        Pertinent Vitals/Pain Pain Assessment: No/denies pain    Home Living                      Prior Function            PT Goals (current goals can now be found in the care plan section) Progress towards PT goals: Progressing toward goals    Frequency  Min 5X/week    PT Plan Current plan remains appropriate    Co-evaluation  End of Session Equipment Utilized During Treatment: Gait belt;Right knee immobilizer Activity Tolerance: Patient limited by lethargy Patient left: in bed;with call bell/phone within reach;with nursing/sitter in room;with family/visitor present     Time: 3606-7703 PT Time Calculation (min) (ACUTE ONLY): 16 min  Charges:                       G Codes:      Jodi Geralds,  SPTA 01/21/2015, 9:53 AM

## 2015-01-21 NOTE — Progress Notes (Signed)
CARE MANAGEMENT NOTE 01/21/2015  Patient:  Douglas [name], Douglas [name]   Account Number:  [id number]  Date Initiated:  01/18/2015  Documentation initiated by:  [facility]  Subjective/Objective Assessment:   fell, rt hip fracture, for ORIF 01/18/15     Action/Plan:   PT/OT evals-recommended   Anticipated DC Date:  01/21/2015   Anticipated DC Plan:  SKILLED NURSING FACILITY  In-house referral  Clinical Social Worker      DC Planning Services  CM consult      Choice offered to / List presented to:             Status of service:  Completed, signed off Medicare Important Message given?  YES (If response is "NO", the following Medicare IM given date fields will be blank) Date Medicare IM given:  01/21/2015 Medicare IM given by:  [facility] Date Additional Medicare IM given:   Additional Medicare IM given by:    Discharge Disposition:  [place]  Per UR Regulation:  Reviewed for med. necessity/level of care/duration of stay  If discussed at [place] of Stay Meetings, dates discussed:    Comments:  01/21/15 Discharging to SNF.

## 2015-01-22 ENCOUNTER — Non-Acute Institutional Stay (SKILLED_NURSING_FACILITY): Payer: Medicare Other | Admitting: Internal Medicine

## 2015-01-22 ENCOUNTER — Other Ambulatory Visit: Payer: Self-pay | Admitting: *Deleted

## 2015-01-22 DIAGNOSIS — S72401D Unspecified fracture of lower end of right femur, subsequent encounter for closed fracture with routine healing: Secondary | ICD-10-CM

## 2015-01-22 DIAGNOSIS — E119 Type 2 diabetes mellitus without complications: Secondary | ICD-10-CM

## 2015-01-22 DIAGNOSIS — I10 Essential (primary) hypertension: Secondary | ICD-10-CM

## 2015-01-22 DIAGNOSIS — N184 Chronic kidney disease, stage 4 (severe): Secondary | ICD-10-CM

## 2015-01-22 LAB — GLUCOSE, CAPILLARY
GLUCOSE-CAPILLARY: 148 mg/dL — AB (ref 70–99)
Glucose-Capillary: 73 mg/dL (ref 70–99)

## 2015-01-22 MED ORDER — HYDROCODONE-ACETAMINOPHEN 5-325 MG PO TABS: ORAL_TABLET | ORAL | Status: DC

## 2015-01-22 NOTE — Telephone Encounter (Signed)
[facility] 

## 2015-01-22 NOTE — Progress Notes (Signed)
Patient ID: Douglas [name], male   DOB: [birth year], 81 y.o.   MRN: [id number]   This is an acute visit.  Level care skilled.  Facility [facility].  Chief complaint-acute visit status post hospitalization for a right distal femur fracture with repair.  History of present illness.  Patient is a pleasant 81 year old male with a history of chronic kidney disease stage IV diabetes hypertension who had a fall sustaining a right distal femur fracture that was surgically repaired with an IM nailing.  Postop he did have acute blood loss anemia and hyperkalemia-he did require a transfusion-also received IV fluids for his renal issues and this improved.  Patiently currently has no acute complaints he was discharged on hydralazine however per his family he has not been on this for some time secondary to renal issues per nephrology recommendations-we will discontinue this and monitor his blood pressures.  Previous medical history.  Femur fracture repaired.  Hypertension.  Chronic any disease stage IV.  Hypertension.  Hyperkalemia resolved.  Diabetes type 2.  Anemia of chronic disease.  Neuropathy lower legs.  History of prostate cancer status post radiation.  History of kidney stones.  History of epiglottitis.  History of hearing loss.  History of bilateral cataracts.  Hyperlipidemia.  Family medical social history is been reviewed per discharge summary on 01/21/2015.  Medications.  Aspirin enteric-coated 325 mg daily.  Vicodin 04/29/2024 milligrams one tablet every 6 hours when necessary.  MiraLAX twice a day.  Senokot-S 2 tablets daily at bedtime.  Neurontin 300 mg daily at bedtime.  Sliding scale insulin with NovoLog insulin.  Norvasc 5 mg daily.  Lipitor 40 mg daily.  Levemir 25 units daily at bedtime.  Acular drops both eyes 3 times a day.  Again hydralazine will be DC'd.  Review of systems.  Patient is somewhat of a poor historian there is some  confusion family states he did have this in the hospital possibly secondary to surgery question anesthesia.  Review of systems provided by patient and family.  In general no complaints of fever or chills.  Skin no complaints of itching or rashes.  Head ears eyes nose mouth and throat-currently not complaining of throat pain or visual changes does have a history of cataracts.  Respiratory does not complain of shortness of breath or cough.  Cardiac no complaints of chest pain.  GI-does not complaining of abdominal discomfort currently.  GU does not complain of dysuria.  Muscle skeletal-again status post femoral repair-pain at this point appears to be controlled  right leg is in an immobilizer.  Neurologic does not complain of dizziness or headache does have history of neuropathy.  Psych per family is more confused than his baseline this apparently has been present since his hospitalization.  Physical exam.  He is afebrile pulse of 82 respirations 19 blood pressure is pending.--Recent blood pressure 164/66  In general this is a pleasant elderly male in no distress lying comfortably in bed he appears to be somewhat confused.  His skin is warm and dry.  Oropharynx clear mucous membranes moist eyes history of cataracts sclera and conjunctivae are clear visual acuity does not appear grossly impaired.  Chest is clear to auscultation with poor effort there is no labored breathing.  Heart is regular rate and rhythm with frequent irregular beats there is no significant lower extremity edema that I can appreciate again the right leg is in an immobilizer.  His abdomen is soft nontender with positive bowel sounds.  Muscle skeletal again right  leg is in an immobilizer  Otherwise moves all extremities appears at baseline grip strength is intact.  Neurologic is grossly intact his speech is clear again he does have confusion he will respond to simple verbal orders but again is  confused.  Psych he is oriented to self quite confused but pleasant.  Labs.  01/20/2015.  WBC 7.6 hemoglobin 9.2 platelets 85,000.  Sodium 139 potassium 4.8 BUN 53 creatinine 3.43.  Assessment and plan.  #1 history of light femoral fracture this was surgically repaired and followed by orthopedics currently it isn't an immobilizer continues on aspirin for anticoagulation this will be assessed by Dr. Dellia Nims tomorrow.  Pain appears to be controlled with Vicodin.  #2 history of chronic kidney disease this is quite significant with a creatinine over 3 but I suspect this is his baseline --chart review baseline appears to run in the mid twos-mid threes   #3-hypertension-again this will have to be monitored Will DC hydralazine he continues on Norvasc--I note most recent blood pressure shows systolic elevation this will have to be watched.  #4-diabetes type 2-this appears to be well controlled with blood sugars running 80s to low 100s recently at this point will monitor he continues on Levemir 25 units-as well as sliding scale as needed.  #5-history of neuropathy he continues on Neurontin  #6-history of anemia-this is thought to be blood loss from surgery-he did receive a transfusion and hemoglobin appears to be improving.  #7 history of hyperkalemia again this resolved as well  [id number]-of note greater than 35 minutes spent assessing patient-reviewing his chart-and coordinating and formulating a plan of care for numerous diagnoses.--Of note greater than 50% of time spent coordinating plan of care  .

## 2015-01-23 ENCOUNTER — Non-Acute Institutional Stay (SKILLED_NURSING_FACILITY): Payer: Medicare Other | Admitting: Internal Medicine

## 2015-01-23 DIAGNOSIS — R339 Retention of urine, unspecified: Secondary | ICD-10-CM

## 2015-01-23 DIAGNOSIS — R0902 Hypoxemia: Secondary | ICD-10-CM

## 2015-01-23 DIAGNOSIS — S72401G Unspecified fracture of lower end of right femur, subsequent encounter for closed fracture with delayed healing: Secondary | ICD-10-CM

## 2015-01-23 DIAGNOSIS — K5641 Fecal impaction: Secondary | ICD-10-CM

## 2015-01-23 LAB — GLUCOSE, CAPILLARY
Glucose-Capillary: 70 mg/dL (ref 70–99)
Glucose-Capillary: 127 mg/dL — ABNORMAL HIGH (ref 70–99)

## 2015-01-24 ENCOUNTER — Ambulatory Visit (HOSPITAL_COMMUNITY): Payer: No Typology Code available for payment source | Attending: Internal Medicine

## 2015-01-24 DIAGNOSIS — R0989 Other specified symptoms and signs involving the circulatory and respiratory systems: Secondary | ICD-10-CM | POA: Insufficient documentation

## 2015-01-24 DIAGNOSIS — R509 Fever, unspecified: Secondary | ICD-10-CM | POA: Insufficient documentation

## 2015-01-24 LAB — GLUCOSE, CAPILLARY
GLUCOSE-CAPILLARY: 238 mg/dL — AB (ref 70–99)
GLUCOSE-CAPILLARY: 129 mg/dL — AB (ref 70–99)
Glucose-Capillary: 176 mg/dL — ABNORMAL HIGH (ref 70–99)
Glucose-Capillary: 156 mg/dL — ABNORMAL HIGH (ref 70–99)
Glucose-Capillary: 299 mg/dL — ABNORMAL HIGH (ref 70–99)
Glucose-Capillary: 265 mg/dL — ABNORMAL HIGH (ref 70–99)

## 2015-01-24 NOTE — Progress Notes (Addendum)
Patient ID: Douglas [name], male   DOB: 1931-11-22, 81 y.o.   MRN: [id number]               HISTORY & PHYSICAL  DATE:  01/23/2015              FACILITY: [facility]      LEVEL OF CARE:   SNF   CHIEF COMPLAINT:  Admission to SNF, post stay at [facility], 01/17/2015 through 01/21/2015.    HISTORY OF PRESENT ILLNESS:  This is a patient who tells me he was going down stairs in his own home, missed the bottom stair and fell.  He suffered a right distal femur fracture.  He underwent surgical repair.  He appears to have tolerated the procedure well.  He is here presumably for rehab.    The patient does not have a history of falling.  He has a cane and a walker at home that he does not use, I think perhaps for unsteadiness related to diabetic peripheral neuropathy.    He has chronic renal failure, also related to his diabetes.    Stated he had a bone scan a year ago that, as far as he is aware, was normal.    PAST MEDICAL HISTORY/PROBLEM LIST:                     Distal femur fracture on the right.  Status post surgical repair.    Hypertension.    Hyperkalemia.    Some degree of acute renal failure in the hospital.  His deterioration in the hospital was felt to be due to prerenal azotemia from acute blood loss anemia.    History of prostate cancer dating back to 2009.  Treated with radiation.  No voiding difficulties described.    Sensorineural hearing loss.    Diabetic retinopathy with some visual loss.    History of calculus of the kidney.    CURRENT MEDICATIONS:  Discharge medications include:       Enteric-coated aspirin 325 q.d.    Norco 5/325, 1-2 tablets every 6 hours p.r.n.    MiraLAX 17 g b.i.d.    Dulcolax 8.6/50, 2 tablets at bedtime.    Neurontin 300 a day.    Norvasc 5 q.d.    Lipitor 40 q.d.    Levemir 25 U at bedtime.    Insulin sliding scale.     Hydralazine 25 three times a day.    Ketorolac ophthalmic solution.    SOCIAL HISTORY:          HOUSING:  The patient lives in [place] with his wife.   FUNCTIONAL STATUS:  He seems independent.  He has no history of falls.  As mentioned, he has a walker and a cane but does not use them.    REVIEW OF SYSTEMS:       CHEST/RESPIRATORY:  He apparently came here on oxygen and desaturated to 85% when the staff tried to wean this to off.  He does not describe pain, dyspnea, cough.   CARDIAC:   No chest pain.   GI:  I think he probably has chronic constipation.  Has not had a bowel movement in three days.   GU:  He does not describe hesitancy, frequency, dysuria, or voiding difficulties, although his wife tells me that he had a catheter in the hospital and that there was some discussion about doing a bladder scan although this was not done.    PHYSICAL EXAMINATION:  VITAL SIGNS:   O2 SATURATIONS:  95% on 2 L.   PULSE:  70.    RESPIRATIONS:  18 and unlabored.   GENERAL APPEARANCE:  The patient does not look unwell.   CHEST/RESPIRATORY:  Exam is clear.   CARDIOVASCULAR:  CARDIAC:   Heart sounds are normal.  He appears to be euvolemic.   GASTROINTESTINAL:  ABDOMEN:   Distended.  Bowel sounds reduced.   LIVER/SPLEEN/KIDNEYS:  No liver, no spleen.  No tenderness.   RECTAL:  Revealed fecal impaction.   GENITOURINARY:  BLADDER:   Distended to the angle of the umbilicus.  A bladder scan in the facility shows well over 900 cc.   PROSTATE:  His prostate is not palpably enlarged.  In fact, it is not palpable at all.   CIRCULATION:   EDEMA/VARICOSITIES:  Extremities:  Some degree of venous stasis.   SKIN:  INSPECTION:  Surgical site looks fine.   NEUROLOGICAL:    DEEP TENDON REFLEXES:  Reflexes are reduced, compatible with diabetic neuropathy.    ASSESSMENT/PLAN:                Status post right distal femur fracture after an accidental fall.  He does not have osteoporosis historically.  He has had an intramedullary nail placed.  He is nonambulatory.    Fecal  impaction.  This will need to be addressed.    Urinary retention.  Well over 900 cc of urine on a bladder scan.  He will need a Foley catheter, temporarily at least.  His urologist is Dr. Jeffie Pollock.  We will solicit his help if needed at this point.  I will probably start him on Flomax, as well.    Hypoxemia.  This is probably related to atelectasis, postoperative issues.  There is no evidence of a DVT, although he comes out on no significant DVT prophylaxis, only on aspirin.  I may actually switch this to Xarelto for prophylaxis.    Type 2 diabetes with all of the complications.  On insulin.  We will need to check his BUN and creatinine, hemoglobin, etc.

## 2015-01-25 ENCOUNTER — Non-Acute Institutional Stay (SKILLED_NURSING_FACILITY): Payer: Medicare Other | Admitting: Internal Medicine

## 2015-01-25 DIAGNOSIS — R5081 Fever presenting with conditions classified elsewhere: Secondary | ICD-10-CM

## 2015-01-25 DIAGNOSIS — R195 Other fecal abnormalities: Secondary | ICD-10-CM

## 2015-01-25 DIAGNOSIS — S72401D Unspecified fracture of lower end of right femur, subsequent encounter for closed fracture with routine healing: Secondary | ICD-10-CM

## 2015-01-25 LAB — GLUCOSE, CAPILLARY
GLUCOSE-CAPILLARY: 236 mg/dL — AB (ref 70–99)
Glucose-Capillary: 136 mg/dL — ABNORMAL HIGH (ref 70–99)
Glucose-Capillary: 239 mg/dL — ABNORMAL HIGH (ref 70–99)

## 2015-01-25 NOTE — Progress Notes (Signed)
Patient ID: Douglas [name], male   DOB: 1931-11-08, 81 y.o.   MRN: [id number]   This is an acute visit.  Level care skilled.  Facility [facility].  Chief complaint-acute visit due to occult positive stool-follow-up vomiting-low-grade temperature  History of present illness.  Patient is a pleasant 81 year old male with a history of chronic kidney disease stage IV diabetes hypertension who had a fall sustaining a right distal femur fracture that was surgically repaired with an IM nailing.  Postop he did have acute blood loss anemia and hyperkalemia-he did require a transfusion-also received IV fluids for his renal issues and this improved.  Patiently currently has no acute complaints  Initially  when he came here however patient did have an episode of vomiting apparently this had some blood-tinged elements-we did guaiac as well at night that was negative however he has had a subsequent one test positive.  His anticoagulation including Xarelto has been put on hold  Clinically he appears to be improved however he is more alert is eating better there's been no further vomiting episodes  He has at times been running a low-grade temperature currently 99.3 however chest x-ray and urinalysis appear to be negative does x-ray did show atelectasis versus scarring.  He does not complain of any chills.  White count is 7.2.  .    Previous medical history.  Femur fracture repaired.  Hypertension.  Chronic any disease stage IV.  Hypertension.  Hyperkalemia resolved.  Diabetes type 2.  Anemia of chronic disease.  Neuropathy lower legs.  History of prostate cancer status post radiation.  History of kidney stones.  History of epiglottitis.  History of hearing loss.  History of bilateral cataracts.  Hyperlipidemia.  Family medical social history is been reviewed per discharge summary on 01/21/2015.  Medications.   Vicodin 04/29/2024 milligrams one tablet every 6  hours when necessary.  MiraLAX twice a day.  Senokot-S 2 tablets daily at bedtime.  Neurontin 300 mg daily at bedtime.  Sliding scale insulin with NovoLog insulin.  Norvasc 5 mg daily.  Lipitor 40 mg daily.  Levemir 25 units daily at bedtime.  Acular drops both eyes 3 times a day.  Again hydralazine will be DC'd.  Review of systems.  Patient is somewhat of a poor historian there is some confusion  At this appears to have improved since his admission  Review of systems provided by patient and family.  In general no complaints of  Chills.--Temperature is 99.3  Skin no complaints of itching or rashes.  Head ears eyes nose mouth and throat-currently not complaining of throat pain or visual changes does have a history of cataracts.  Respiratory does not complain of shortness of breath or cough.  Cardiac no complaints of chest pain.  GI-does not complaining of abdominal discomfort currently no vomiting since initial episode shortly after admission.  GU does not complain of dysuria.  Muscle skeletal-again status post femoral repair-pain at this point appears to be controlled  right leg is in an immobilizer.  Neurologic does not complain of dizziness or headache does have history of neuropathy.  Psych per family is more confused than his baseline this apparently has been present since his hospitalization.--However this appears to be improving  Physical exam.  Temperature 99.3 pulse 82 respirations 20 blood pressure 133/78  In general this is a pleasant elderly male in no distress lying comfortably in bed -he appears more alert is confused than previously  His skin is warm and dry.  Oropharynx clear  mucous membranes moist eyes history of cataracts sclera and conjunctivae are clear visual acuity does not appear grossly impaired.  Chest is clear to auscultation with poor effort there is no labored breathing.  Heart is regular rate and rhythm with occasional irregular  beats there is no significant lower extremity edema that I can appreciate again the right leg is in an immobilizer.  His abdomen is soft nontender with positive bowel sounds.  GU-she has an indwelling Foley catheter draining amber colored urine  Muscle skeletal again right leg is in an immobilizer  Otherwise moves all extremities appears at baseline grip strength is intact.  Neurologic is grossly intact his speech is clear again he appears more cognizant conversant compared to previous exam    Labs  01/25/2015.  WBC 7.2 hemoglobin 9.0 platelets 163.  Sodium 136 potassium 5.1 BUN 90 creatinine 3.83.  Marland Kitchen  01/24/2015.  WBC 11.4-of note granulocytes are within normal limits-hemoglobin 9.8 platelets 191.  Sodium 137 potassium 5.3 BUN 87 creatinine 3.6  01/20/2015.  WBC 7.6 hemoglobin 9.2 platelets 85,000.  Sodium 139 potassium 4.8 BUN 53 creatinine 3.43.  Assessment and plan.  #1 history of light femoral fracture this was surgically repaired and followed by orthopedics currently has an immobilizer on-anticoagulation currently on hold secondary to GI bleed concerns-I do not see significant edema although this will have to be monitored he is in an immobilizer.  Pain appears to be controlled with Vicodin.  #2 history of chronic kidney disease this is quite significant with a creatinine over 3 but I suspect this is his baseline --this will need updating Day  3 anemia-this was thought to be multi-factorial including blood loss anemia-hemoglobin was rising initially but appears to have dropped a bit on lab done today-some of this could be lab variation-but this will need to be followed closely-update a CBC next laboratory day.  Also I spoke with his wife about a GI consult he is followed in [place]-this will have to be arranged expediently  #4-low-grade temp   At times-he is asymptomatic urinalysis and chest x-ray relatively benign-his white count has normalized-at this point  continue to monitor  [id number] l

## 2015-01-26 LAB — GLUCOSE, CAPILLARY
GLUCOSE-CAPILLARY: 220 mg/dL — AB (ref 70–99)
GLUCOSE-CAPILLARY: 189 mg/dL — AB (ref 70–99)
Glucose-Capillary: 170 mg/dL — ABNORMAL HIGH (ref 70–99)
Glucose-Capillary: 239 mg/dL — ABNORMAL HIGH (ref 70–99)

## 2015-01-27 DIAGNOSIS — E119 Type 2 diabetes mellitus without complications: Secondary | ICD-10-CM | POA: Insufficient documentation

## 2015-01-27 LAB — GLUCOSE, CAPILLARY
GLUCOSE-CAPILLARY: 144 mg/dL — AB (ref 70–99)
Glucose-Capillary: 307 mg/dL — ABNORMAL HIGH (ref 70–99)
Glucose-Capillary: 230 mg/dL — ABNORMAL HIGH (ref 70–99)

## 2015-01-28 ENCOUNTER — Non-Acute Institutional Stay (SKILLED_NURSING_FACILITY): Payer: Medicare Other | Admitting: Internal Medicine

## 2015-01-28 DIAGNOSIS — R339 Retention of urine, unspecified: Secondary | ICD-10-CM

## 2015-01-28 DIAGNOSIS — R195 Other fecal abnormalities: Secondary | ICD-10-CM | POA: Insufficient documentation

## 2015-01-28 DIAGNOSIS — N179 Acute kidney failure, unspecified: Secondary | ICD-10-CM

## 2015-01-28 DIAGNOSIS — N184 Chronic kidney disease, stage 4 (severe): Secondary | ICD-10-CM

## 2015-01-28 DIAGNOSIS — R5081 Fever presenting with conditions classified elsewhere: Secondary | ICD-10-CM | POA: Insufficient documentation

## 2015-01-28 LAB — GLUCOSE, CAPILLARY
GLUCOSE-CAPILLARY: 289 mg/dL — AB (ref 70–99)
Glucose-Capillary: 173 mg/dL — ABNORMAL HIGH (ref 70–99)
Glucose-Capillary: 272 mg/dL — ABNORMAL HIGH (ref 70–99)
Glucose-Capillary: 301 mg/dL — ABNORMAL HIGH (ref 70–99)
Glucose-Capillary: 189 mg/dL — ABNORMAL HIGH (ref 70–99)

## 2015-01-29 LAB — GLUCOSE, CAPILLARY
GLUCOSE-CAPILLARY: 233 mg/dL — AB (ref 70–99)
GLUCOSE-CAPILLARY: 247 mg/dL — AB (ref 70–99)
Glucose-Capillary: 202 mg/dL — ABNORMAL HIGH (ref 70–99)
Glucose-Capillary: 245 mg/dL — ABNORMAL HIGH (ref 70–99)

## 2015-01-30 ENCOUNTER — Encounter (HOSPITAL_COMMUNITY)
Admission: RE | Admit: 2015-01-30 | Discharge: 2015-01-30 | Disposition: A | Discharge status: Home / Self Care | Payer: Medicare Other | Source: Skilled Nursing Facility | Attending: Internal Medicine | Admitting: Internal Medicine

## 2015-01-30 ENCOUNTER — Non-Acute Institutional Stay (SKILLED_NURSING_FACILITY): Payer: Medicare Other | Admitting: Internal Medicine

## 2015-01-30 DIAGNOSIS — N179 Acute kidney failure, unspecified: Secondary | ICD-10-CM

## 2015-01-30 DIAGNOSIS — E1122 Type 2 diabetes mellitus with diabetic chronic kidney disease: Secondary | ICD-10-CM

## 2015-01-30 DIAGNOSIS — I10 Essential (primary) hypertension: Secondary | ICD-10-CM

## 2015-01-30 DIAGNOSIS — R410 Disorientation, unspecified: Secondary | ICD-10-CM

## 2015-01-30 DIAGNOSIS — D649 Anemia, unspecified: Secondary | ICD-10-CM

## 2015-01-30 DIAGNOSIS — N189 Chronic kidney disease, unspecified: Secondary | ICD-10-CM

## 2015-01-30 LAB — CBC WITH DIFFERENTIAL/PLATELET
BASOS PCT: 0 % (ref 0–1)
Basophils Absolute: 0 10*3/uL (ref 0.0–0.1)
EOS ABS: 0.4 10*3/uL (ref 0.0–0.7)
EOS PCT: 4 % (ref 0–5)
HCT: 27.3 % — ABNORMAL LOW (ref 39.0–52.0)
Hemoglobin: 8.7 g/dL — ABNORMAL LOW (ref 13.0–17.0)
Lymphocytes Relative: 20 % (ref 12–46)
Lymphs Abs: 1.7 10*3/uL (ref 0.7–4.0)
MCH: 28.4 pg (ref 26.0–34.0)
MCHC: 31.9 g/dL (ref 30.0–36.0)
MCV: 89.2 fL (ref 78.0–100.0)
Monocytes Absolute: 0.4 10*3/uL (ref 0.1–1.0)
Monocytes Relative: 5 % (ref 3–12)
Neutro Abs: 5.8 10*3/uL (ref 1.7–7.7)
Neutrophils Relative %: 71 % (ref 43–77)
Platelets: 251 10*3/uL (ref 150–400)
RBC: 3.06 MIL/uL — ABNORMAL LOW (ref 4.22–5.81)
RDW: 13.2 % (ref 11.5–15.5)
WBC: 8.4 10*3/uL (ref 4.0–10.5)

## 2015-01-30 LAB — BASIC METABOLIC PANEL
Anion gap: 8 (ref 5–15)
BUN: 63 mg/dL — ABNORMAL HIGH (ref 6–23)
CO2: 20 mmol/L (ref 19–32)
CREATININE: 2.99 mg/dL — AB (ref 0.50–1.35)
Calcium: 8.4 mg/dL (ref 8.4–10.5)
Chloride: 112 mmol/L (ref 96–112)
GFR, EST AFRICAN AMERICAN: 21 mL/min — AB (ref >90)
GFR, EST NON AFRICAN AMERICAN: 18 mL/min — AB (ref >90)
GLUCOSE: 214 mg/dL — AB (ref 70–99)
Potassium: 5.1 mmol/L (ref 3.5–5.1)
SODIUM: 140 mmol/L (ref 135–145)

## 2015-01-30 LAB — GLUCOSE, CAPILLARY
GLUCOSE-CAPILLARY: 221 mg/dL — AB (ref 70–99)
GLUCOSE-CAPILLARY: 281 mg/dL — AB (ref 70–99)
Glucose-Capillary: 213 mg/dL — ABNORMAL HIGH (ref 70–99)
Glucose-Capillary: 260 mg/dL — ABNORMAL HIGH (ref 70–99)

## 2015-01-30 NOTE — Progress Notes (Signed)
Patient ID: Douglas [name], male   DOB: 06/04/1931, 81 y.o.   MRN: [id number]   ,       This is an acute visit.  Level care skilled.  Facility [facility].   Chief complaint-acute visit follow-up renal insufficiency anemia     HISTORY OF PRESENT ILLNESS:  Douglas [name] is a gentleman whom we admitted to the facility last week.  He had a right distal femur fracture.    He has chronic renal failure, presumably related to diabetes. It appears his baseline previously has been a creatinine in the 2.3 to 2.7 area.  In the hospital, this was up to 3.66 on admission and dropping down to 3.2 at discharge.    Shortly after admission here he was in obvious urinary retention, cathed for between 900 and 1000 cc.  He has a history of prostate cancer.  His prostate was not palpable.  He did not have a voiding issue at home apparently.  He also had severe fecal impaction which had to be cleared.  .    Lab work done in the facility earlier this week shows a BUN of 87 and a creatinine of 3.66.  This seems higher than when he left the hospital at 56 and 3.20, respectively.  .    Furthermore, he actually had a bloody emesis last week.  There was  apparently a small amount of frank bleeding.  He also was OB-positive, although this was several days later and might be compatible with blood moving through his GI tract.  His hemoglobin was 9.8 on 01/24/2015.  This was 9.2 when he left the hospital.   This was not a sizable bleed.  We have asked GI for a consult here.  We discontinued the aspirin and the Xarelto he was receiving secondary to DVT prophylaxis.  He is receiving IV fluids-initially at 100 mL an hour and this was reduced to 50 mL an hour yesterday Laboratory yesterday showed an improved creatinine of 3.33 BUN of 77-lab work done today shows continued improvement with a creatinine of 2.99 and BUN of 63.  It also shows a hemoglobin of 8.7.  Clinically patient appears to be stable noted times he  has an elevated systolics as high the 354S but this does not appear to be persistent there appears to be quite a bit of variability here however this will have to be monitored -he is on Norvasc 5 mg daily.  He continues with intermittent confusion apparently this progresses somewhat at night-otherwise she's stable according in nursing staff he is eating and drinking well appears to be stronger than when I first saw him last week --- he has no complaints      CURRENT MEDICATIONS:  Medication list is reviewed.       Norco 5/325, 1-2 tablets q.6.    MiraLAX 17 g q.d.     Senna-S 8.6/50 mg daily.    Neurontin 300 mg at bedtime.    Insulin sliding scale.    Norvasc 5 q.d.     Lipitor 40 q.d.    Levemir 25 U at bedtime.    Hydralazine 25 q.d.     REVIEW OF SYSTEMS: General does not complain of fever or chills.  Head ears eyes nose mouth and throat does not when visual changes or sore throat                      CHEST/RESPIRATORY:  No shortness of breath.  CARDIAC:  No chest pain.      GI:   Apparently, a massive amount of impaction has been removed.  There has been no further vomiting or bleeding.  GU-he does have an indwelling Foley catheter which is draining amber colored urine  Muscle skeletal really does not complaining of joint pain or right leg pain.  Neurologic does not complain of dizziness headache or numbness.  Psych again he does have occasional periods of confusion.       PHYSICAL EXAMINATION:   Temperature 97.5 pulse 85 respirations 18 blood pressure variable most recently 188/67-120/73 in this range  GENERAL APPEARANCE:  The patient looks to be in no distress resting comfortably in bed.  His skin is warm and dry.  Oropharynx is clear mucous membranes appear fairly moist.   CHEST/RESPIRATORY:  Clear air entry bilaterally.    CARDIOVASCULAR:  CARDIAC:   Heart sounds are normal.  He appears to be euvolemic.   GASTROINTESTINAL:  ABDOMEN:   Soft with  positive bowel sounds does not appear to be overtly distended.  There is no tenderness.   GENITOURINARY:  BLADDER:   Foley catheter is still in place--draining amber colored urine I do not see any evidence of blood.    Muscle skeletal-does have an immobilizer on the right leg that there is no lower extremity edema that is apparent-moves his other extremities at baseline.  Neurologic is grossly intact moves all extremities at baseline no lateralizing findings her speech is clear.  Psych he again he does continue with some intermittent confusion but appears to be more alert and oriented and last week is pleasant and appropriate.  Labs.  Jan- third 2016.  Sodium 140 potassium 5.1 BUN 63 creatinine 2.99.  WBC 8.4 hemoglobin 8.7 platelets 251  Feb-- first 2016.  Sodium 138 potassium 5.3 BUN 92 creatinine 4.18.    ASSESSMENT/PLAN:    Multitude of medical issues:    Acute-on-chronic renal failure.  It would appear that his baseline creatinine appears to be in the mid to upper 2 range.   He went up during the hospitalization--appear to increase somewhat initially here-he has received IV fluids and now renal function appears relatively baseline-this was discussed with Dr. Dellia Nims will discontinue the IV and monitor with a lab on Friday-apparently he is eating and drinking well-      Upper GI bleeding.  He vomited an amount of frank blood.  His hemoglobin has remained relatively stable slightly down at 8.7 today but again this could reflect possibly some mild renal insufficiency which has improved---will update this again on Friday as well clinically appears stable no frank evidence of bleeding today--his anticoagulation has been put on hold-he is  on proton pump inhibitor      Urinary retention. At this point will continue Foley catheter per discussion with Dr. Dellia Nims at some point I suspect we will attempt to remove this--he is on Flomax  t.    ?Postoperative delirium. This appears to be  gradually improving--he did have a Mini-Mental status exam done that was 16 out of 30-will need continued monitoring but this appears gradually improving  Hypertension-this will have to be monitored patient currently is out to an orthopedic appointment this will have to be rechecked when he returns-notify provider of any systolic greater than 349 or diastolic greater than 17-HX is on Norvasc 5 mg today we may have to go up on this pending more readings.  Diabetes type 2-I have reviewed his blood sugars initially had some low readings in  the 43s on admission--and with his history of vomiting and poor by mouth intake his insulin Levemir was held but in the past week or so morning sugars appear to run mainly in the mid to high 100s to lower 200s-appears to run fairly consistently in the mid 200s recently at noon and the mid 100s to high 200s at 6 PM I even see some low 300s.  On 8 PM he appears to run more in the high 100s to mid 200s.  We will restart Levemir at low dose 5 units daily at bedtime and monitor-he has been treated with a sliding scale  Addendum-patient has just returned from his orthopedic appointment-he is complaining of itching and discomfort with this catheter-will remove this but this will have to be monitored closely for any sign of urinary retention-I did examine him he appeared to be stable clinically and did not note any discharge or increased erythema the penis area again this will have to be watched if he has urinary retention certainly will have to seriously consider reinserting Foley   [id number]-of note more than 35 minutes spent assessing patient-discussing his status with nursing staff and family at [facility] his chart-and coordinating and formulating a plan of care for numerous diagnoses-of note greater than 50% of time spent coordinating plan of care  .      Marland Kitchen

## 2015-01-30 NOTE — Progress Notes (Addendum)
Patient ID: Douglas [name], male   DOB: 09-Jun-1931, 81 y.o.   MRN: [id number]               PROGRESS NOTE  DATE:  01/28/2015             FACILITY: [facility]      LEVEL OF CARE:   SNF   Acute Visit   CHIEF COMPLAINT:  Follow up medical issues.    HISTORY OF PRESENT ILLNESS:  Douglas [name] is a gentleman whom we admitted to the facility last week.  He had a right distal femur fracture.    He has chronic renal failure, presumably related to diabetes.  Looking back through his records shows a creatinine in the 2.3 to 2.7 area.  In the hospital, this was up to 3.66 on admission and dropping down to 3.2 at discharge.    When we admitted him, he was in obvious urinary retention, cathed for between 900 and 1000 cc.  He has a history of prostate cancer.  His prostate was not palpable.  He did not have a voiding issue at home that I was able to elicit.  He also had severe fecal impaction which had to be cleared.  On rare occasions, I have seen fecal impaction elicit urinary retention, although I am not exactly sure that was the issue.    Lab work done in the facility shows a BUN of 87 and a creatinine of 3.66.  This seems higher than when he left the hospital at 56 and 3.20, respectively.  His last lab work was on 01/24/2015.  All of this is higher than when he entered the hosptial  Furthermore, he actually had a bloody emesis the last time I saw him later in the day.  There was  apparently a small amount of frank bleeding.  He also was OB-positive, although this was several days later and might be compatible with blood moving through his GI tract.  His hemoglobin was 9.8 on 01/24/2015.  This was 9.2 when he left the hospital.   This was not a sizable bleed.  We have asked GI for a consult here.  We discontinued the aspirin and the Xarelto that I was hoping to put him on for DVT prophylaxis.    The last issue raised by his wife is that he has  apparently not returned in terms of his  baseline mental status.  His wife states he had no cognitive issues prior to this and  apparently confirmed this with his primary doctor.  This is  apparently worse at night, although I have not had any reports here.    CURRENT MEDICATIONS:  Medication list is reviewed.       Norco 5/325, 1-2 tablets q.6.    MiraLAX 17 g q.d.     Senna-S 8.6/50 mg daily.    Neurontin 300 mg at bedtime.    Insulin sliding scale.    Norvasc 5 q.d.     Lipitor 40 q.d.    Levemir 25 U at bedtime.    Hydralazine 25 q.d.     REVIEW OF SYSTEMS:                      CHEST/RESPIRATORY:  No shortness of breath.  CARDIAC:   No chest pain.      GI:   Apparently, a massive amount of impaction has been removed.  There has been no further vomiting or bleeding.  PHYSICAL EXAMINATION:   GENERAL APPEARANCE:  The patient looks to be in no distress.   CHEST/RESPIRATORY:  Clear air entry bilaterally.    CARDIOVASCULAR:  CARDIAC:   Heart sounds are normal.  He appears to be euvolemic.   GASTROINTESTINAL:  ABDOMEN:   Much less distended than when I saw him last week.  There is no tenderness.   GENITOURINARY:  BLADDER:   Foley catheter is still in place.  He had some bleeding over the weekend.  However, a culture was negative.    ASSESSMENT/PLAN:    Multitude of medical issues:    Acute-on-chronic renal failure.  It would appear that his baseline creatinine appears to be in the 2.5- 3 range.   He went up during the hospitalization and this seems to be continuing here.  I am going to recheck this on Wednesday.  He appears to be euvolemic at the moment.    Upper GI bleeding.  He vomited an amount of frank blood.  His hemoglobin has remained stable.  I have asked for a GI consult.  Both his aspirin and Xarelto, which I had initially wanted to put him on for DVT prophylaxis, are on hold.  I hope he was put on a proton pump inhibitor.    Urinary retention.  At some point in time, I am going to try to pull this  catheter.  His impaction has been relieved.  He is on Flomax.  If this fails, then we will need to solicit a Urology consult.    ?Postoperative delirium.  His wife feels that he is much more confused than usual.  She denies any baseline mental status issues.  I am going to have the [facility] staff do a baseline screening on him.     The major concern here is the renal insufficiency.  This is going to need to be rechecked.  I am not planning to stop the Foley catheter until Wednesday at the earliest.                                 ADDENDUM:    I have stumbled on lab work on him from today that shows a BUN of 92, a creatinine of 4.18, and a potassium of 5.3.  He is going to need intravenous fluid.

## 2015-01-31 ENCOUNTER — Encounter (INDEPENDENT_AMBULATORY_CARE_PROVIDER_SITE_OTHER): Payer: Medicare Other | Admitting: Ophthalmology

## 2015-01-31 LAB — GLUCOSE, CAPILLARY
GLUCOSE-CAPILLARY: 231 mg/dL — AB (ref 70–99)
GLUCOSE-CAPILLARY: 237 mg/dL — AB (ref 70–99)
GLUCOSE-CAPILLARY: 329 mg/dL — AB (ref 70–99)
Glucose-Capillary: 167 mg/dL — ABNORMAL HIGH (ref 70–99)
Glucose-Capillary: 231 mg/dL — ABNORMAL HIGH (ref 70–99)
Glucose-Capillary: 238 mg/dL — ABNORMAL HIGH (ref 70–99)
Glucose-Capillary: 257 mg/dL — ABNORMAL HIGH (ref 70–99)

## 2015-02-01 ENCOUNTER — Non-Acute Institutional Stay (SKILLED_NURSING_FACILITY): Payer: Medicare Other | Admitting: Internal Medicine

## 2015-02-01 ENCOUNTER — Other Ambulatory Visit (HOSPITAL_COMMUNITY)
Admission: RE | Admit: 2015-02-01 | Discharge: 2015-02-01 | Disposition: A | Discharge status: Home / Self Care | Payer: Medicare Other | Source: Ambulatory Visit | Attending: Internal Medicine | Admitting: Internal Medicine

## 2015-02-01 DIAGNOSIS — N289 Disorder of kidney and ureter, unspecified: Secondary | ICD-10-CM

## 2015-02-01 DIAGNOSIS — E119 Type 2 diabetes mellitus without complications: Secondary | ICD-10-CM

## 2015-02-01 DIAGNOSIS — S72401D Unspecified fracture of lower end of right femur, subsequent encounter for closed fracture with routine healing: Secondary | ICD-10-CM

## 2015-02-01 DIAGNOSIS — R195 Other fecal abnormalities: Secondary | ICD-10-CM

## 2015-02-01 DIAGNOSIS — I10 Essential (primary) hypertension: Secondary | ICD-10-CM

## 2015-02-01 LAB — CBC WITH DIFFERENTIAL/PLATELET
BASOS ABS: 0.1 10*3/uL (ref 0.0–0.1)
BASOS PCT: 1 % (ref 0–1)
EOS ABS: 0.4 10*3/uL (ref 0.0–0.7)
EOS PCT: 3 % (ref 0–5)
HCT: 27.8 % — ABNORMAL LOW (ref 39.0–52.0)
HEMOGLOBIN: 9 g/dL — AB (ref 13.0–17.0)
LYMPHS PCT: 18 % (ref 12–46)
Lymphs Abs: 1.9 10*3/uL (ref 0.7–4.0)
MCH: 28.7 pg (ref 26.0–34.0)
MCHC: 32.4 g/dL (ref 30.0–36.0)
MCV: 88.5 fL (ref 78.0–100.0)
Monocytes Absolute: 0.6 10*3/uL (ref 0.1–1.0)
Monocytes Relative: 6 % (ref 3–12)
Neutro Abs: 7.7 10*3/uL (ref 1.7–7.7)
Neutrophils Relative %: 72 % (ref 43–77)
Platelets: 309 10*3/uL (ref 150–400)
RBC: 3.14 MIL/uL — AB (ref 4.22–5.81)
RDW: 13.4 % (ref 11.5–15.5)
WBC: 10.6 10*3/uL — AB (ref 4.0–10.5)

## 2015-02-01 LAB — GLUCOSE, CAPILLARY
GLUCOSE-CAPILLARY: 168 mg/dL — AB (ref 70–99)
GLUCOSE-CAPILLARY: 328 mg/dL — AB (ref 70–99)
Glucose-Capillary: 267 mg/dL — ABNORMAL HIGH (ref 70–99)
Glucose-Capillary: 336 mg/dL — ABNORMAL HIGH (ref 70–99)
Glucose-Capillary: 389 mg/dL — ABNORMAL HIGH (ref 70–99)

## 2015-02-01 LAB — BASIC METABOLIC PANEL
Anion gap: 5 (ref 5–15)
BUN: 56 mg/dL — ABNORMAL HIGH (ref 6–23)
CO2: 19 mmol/L (ref 19–32)
Calcium: 8.5 mg/dL (ref 8.4–10.5)
Chloride: 112 mmol/L (ref 96–112)
Creatinine, Ser: 2.84 mg/dL — ABNORMAL HIGH (ref 0.50–1.35)
GFR calc Af Amer: 22 mL/min — ABNORMAL LOW (ref >90)
GFR calc non Af Amer: 19 mL/min — ABNORMAL LOW (ref >90)
Glucose, Bld: 177 mg/dL — ABNORMAL HIGH (ref 70–99)
POTASSIUM: 5 mmol/L (ref 3.5–5.1)
SODIUM: 136 mmol/L (ref 135–145)

## 2015-02-01 NOTE — Progress Notes (Signed)
Patient ID: Douglas [name], male   DOB: [birth year], 81 y.o.   MRN: [id number]   This is a discharge note.  Level care skilled  [place].   Chief complaint-discharge note     HISTORY OF PRESENT ILLNESS:  Douglas [name] is a gentleman whom we admitted to the facility last week.  He had a right distal femur fracture.    He has chronic renal failure, presumably related to diabetes. It appears his baseline previously has been a creatinine in the 2.3 to 2.7 area.  In the hospital, this was up to 3.66 on admission and dropping down to 3.2 at discharge.    Shortly after admission here he was in obvious urinary retention, cathed for between 900 and 1000 cc.  He has a history of prostate cancer.  His prostate was not palpable.  He did not have a voiding issue at home apparently.  He also had severe fecal impaction which had to be cleared.  .    Lab work done in the facility earlier this week shows a BUN of 87 and a creatinine of 3.66.  This seems higher than when he left the hospital at 56 and 3.20, respectively.  .    Furthermore, he actually had a bloody emesis last week.  There was  apparently a small amount of frank bleeding.  He also was OB-positive, although this was several days later and might be compatible with blood moving through his GI tract.  His hemoglobin was 9.8 on 01/24/2015.  This was 9.2 when he left the hospital.   This was not a sizable bleed.  We have asked GI for a consult here.  We discontinued the aspirin and the Xarelto he was receiving secondary to DVT prophylaxis.  He received IV fluids-initially at 100 mL an hour and this was reduced to 50 mL an hour   -His renal function appears to have stabilized today BUN is 56 creatinine 2.84 sodium 136 potassium 5-  It also shows a hemoglobin of 9.0.  Patient apparently is insisting on going home and his family feels he would actually eat and drink better if he was in a home situation. Douglas [name] he does appear to be  improving but he will have to be followed closely.         CURRENT MEDICATIONS:  Medication list is reviewed.       Norco 5/325, 1-2 tablets q.6.    MiraLAX 17 g q.d.     Senna-S 8.6/50 mg daily.    Neurontin 300 mg at bedtime.    Insulin sliding scale.    Norvasc 5 q.d.     Lipitor 40 q.d.    Levemir 25 U at bedtime.    Hydralazine 25 q.d.     REVIEW OF SYSTEMS: General does not complain of fever or chills.  Head ears eyes nose mouth and throat does not when visual changes or sore throat                      CHEST/RESPIRATORY:  No shortness of breath.  CARDIAC:   No chest pain.      GI:   Initially a constipation issues this appears resolved.  There has been no further vomiting or bleeding. Does not complain of any abdominal pain  GU-he does have an indwelling Foley catheter which is draining amber colored urine  Muscle skeletal really does not complaining of joint pain or right leg pain.  Neurologic does  not complain of dizziness headache or numbness.  Psych again he does have occasional periods of confusion. This appears to have improved however since his admission       PHYSICAL EXAMINATION:   Temperature 98.0 pulse 79 respirations 19 blood pressure 138/75   GENERAL APPEARANCE:  The patient looks to be in no distress sitting comfortably in his wheelchair.  His skin is warm and dry.  Oropharynx is clear mucous membranes appear fairly moist.   CHEST/RESPIRATORY:  Clear air entry bilaterally.    CARDIOVASCULAR:  CARDIAC:   Heart sounds are normal.  He appears to be euvolemic.   GASTROINTESTINAL:  ABDOMEN:   Soft with positive bowel sounds does not appear to be overtly distended.  There is no tenderness.   GENITOURINARY:  BLADDER:   Foley catheter is still in place--draining amber colored urine I do not see any evidence of blood.    Muscle skeletal-does have an immobilizer on the right leg that there is no lower extremity edema that is apparent-moves  his other extremities at baseline.  Neurologic is grossly intact moves all extremities at baseline no lateralizing findings her speech is clear.  Psych he again he does continue with some intermittent confusion but appears to be more alert and oriented---- is pleasant and appropriate.  Labs  02/01/2015.  Sodium 136 potassium 5 BUN 56 creatinine 2.84.  WBC 10.6 hemoglobin 9.0 platelets 309.  Jan- third 2016.  Sodium 140 potassium 5.1 BUN 63 creatinine 2.99.  WBC 8.4 hemoglobin 8.7 platelets 251  Feb-- first 2016.  Sodium 138 potassium 5.3 BUN 92 creatinine 4.18.    ASSESSMENT/PLAN:    Multitude of medical issues  Femur fracture-he is followed by orthopedics this appears to be stable in fact he has just  been seen orthopedics-will need continued follow-up and home health support as well as PT and OT:    Acute-on-chronic renal failure.  It would appear that his baseline creatinine appears to be in the mid to upper 2 range.   He went up during the hospitalization--appear to increase somewhat initially here-he has received IV fluids and now renal function appears relatively baseline-  This will need expedient follow-up will have home health draw a metabolic panel on Monday, February 8 notify primary care provider of results      Upper GI bleeding.  He vomited an amount of frank blood.  His hemoglobin has remained relatively stable  This will need to be rechecked Monday as well by home health-he does have GI consult arranged  -his anticoagulation has been put on hold-he is  on proton pump inhibitor      Urinary retention. At this point will continue Foley catheter--with urology follow-up-he has failed voiding trial status post Foley catheter removal and  catheter has been  reinserted-again he needs urology follow-up which is being arranged r  t.    ?Postoperative delirium. This appears to be gradually improving--he did have a Mini-Mental status exam done that was 16 out of  30-will need continued monitoring but this appears gradually improving  Hypertension- This appears to be relatively stable recent blood pressure 138/75-108/82  Diabetes type 2-recent sugars appear to run more in the higher 100s low 200s his appetite is improving I suspect this will need titration by his primary care provider --he has been started on low-dose Levemir 5 units--he is going home would be hesitant to be too aggressive here-he also is on sliding scale his wife is familiar with this  I suspect the patient  would benefit from continued PT and OT and monitoring here however he is quite insistent on going home and family is quite attentive-again he will need expedient follow-up by his primary care provider as well as urology and a GI consult-as well as nephrology which family follows closely. He also is followed by orthopedics  Also will need PT and OT and home health support for his multiple medical issues    .    [id number] note greater than 30 minutes spent on this discharge summary  .

## 2015-02-02 ENCOUNTER — Encounter (HOSPITAL_COMMUNITY): Payer: Self-pay | Admitting: Emergency Medicine

## 2015-02-02 ENCOUNTER — Other Ambulatory Visit (HOSPITAL_COMMUNITY): Payer: Self-pay

## 2015-02-02 ENCOUNTER — Other Ambulatory Visit: Payer: Self-pay

## 2015-02-02 ENCOUNTER — Inpatient Hospital Stay (HOSPITAL_COMMUNITY)
Admission: EM | Admit: 2015-02-02 | Discharge: 2015-02-09 | DRG: 194 | Disposition: A | Discharge status: Home Health | Payer: Medicare Other | Attending: Internal Medicine | Admitting: Internal Medicine

## 2015-02-02 ENCOUNTER — Emergency Department (HOSPITAL_COMMUNITY): Payer: Medicare Other

## 2015-02-02 DIAGNOSIS — N184 Chronic kidney disease, stage 4 (severe): Secondary | ICD-10-CM | POA: Diagnosis present

## 2015-02-02 DIAGNOSIS — E1143 Type 2 diabetes mellitus with diabetic autonomic (poly)neuropathy: Secondary | ICD-10-CM | POA: Diagnosis present

## 2015-02-02 DIAGNOSIS — L89629 Pressure ulcer of left heel, unspecified stage: Secondary | ICD-10-CM | POA: Diagnosis present

## 2015-02-02 DIAGNOSIS — R309 Painful micturition, unspecified: Secondary | ICD-10-CM | POA: Diagnosis not present

## 2015-02-02 DIAGNOSIS — N390 Urinary tract infection, site not specified: Secondary | ICD-10-CM | POA: Diagnosis present

## 2015-02-02 DIAGNOSIS — F0390 Unspecified dementia without behavioral disturbance: Secondary | ICD-10-CM | POA: Diagnosis present

## 2015-02-02 DIAGNOSIS — Z09 Encounter for follow-up examination after completed treatment for conditions other than malignant neoplasm: Secondary | ICD-10-CM

## 2015-02-02 DIAGNOSIS — E119 Type 2 diabetes mellitus without complications: Secondary | ICD-10-CM

## 2015-02-02 DIAGNOSIS — J189 Pneumonia, unspecified organism: Secondary | ICD-10-CM | POA: Diagnosis present

## 2015-02-02 DIAGNOSIS — D509 Iron deficiency anemia, unspecified: Secondary | ICD-10-CM | POA: Diagnosis present

## 2015-02-02 DIAGNOSIS — R339 Retention of urine, unspecified: Secondary | ICD-10-CM | POA: Diagnosis not present

## 2015-02-02 DIAGNOSIS — B952 Enterococcus as the cause of diseases classified elsewhere: Secondary | ICD-10-CM | POA: Diagnosis present

## 2015-02-02 DIAGNOSIS — Z794 Long term (current) use of insulin: Secondary | ICD-10-CM | POA: Diagnosis not present

## 2015-02-02 DIAGNOSIS — N3001 Acute cystitis with hematuria: Secondary | ICD-10-CM

## 2015-02-02 DIAGNOSIS — E875 Hyperkalemia: Secondary | ICD-10-CM | POA: Diagnosis present

## 2015-02-02 DIAGNOSIS — Y95 Nosocomial condition: Secondary | ICD-10-CM | POA: Diagnosis present

## 2015-02-02 DIAGNOSIS — K3184 Gastroparesis: Secondary | ICD-10-CM | POA: Diagnosis present

## 2015-02-02 DIAGNOSIS — Z923 Personal history of irradiation: Secondary | ICD-10-CM

## 2015-02-02 DIAGNOSIS — R1110 Vomiting, unspecified: Secondary | ICD-10-CM | POA: Clinically undetermined

## 2015-02-02 DIAGNOSIS — R3915 Urgency of urination: Secondary | ICD-10-CM | POA: Diagnosis not present

## 2015-02-02 DIAGNOSIS — I129 Hypertensive chronic kidney disease with stage 1 through stage 4 chronic kidney disease, or unspecified chronic kidney disease: Secondary | ICD-10-CM | POA: Diagnosis present

## 2015-02-02 DIAGNOSIS — R509 Fever, unspecified: Secondary | ICD-10-CM | POA: Diagnosis present

## 2015-02-02 DIAGNOSIS — B372 Candidiasis of skin and nail: Secondary | ICD-10-CM | POA: Diagnosis present

## 2015-02-02 DIAGNOSIS — Z8249 Family history of ischemic heart disease and other diseases of the circulatory system: Secondary | ICD-10-CM | POA: Diagnosis not present

## 2015-02-02 DIAGNOSIS — K5900 Constipation, unspecified: Secondary | ICD-10-CM | POA: Diagnosis present

## 2015-02-02 DIAGNOSIS — Z8546 Personal history of malignant neoplasm of prostate: Secondary | ICD-10-CM | POA: Diagnosis not present

## 2015-02-02 DIAGNOSIS — E785 Hyperlipidemia, unspecified: Secondary | ICD-10-CM | POA: Diagnosis present

## 2015-02-02 DIAGNOSIS — I10 Essential (primary) hypertension: Secondary | ICD-10-CM | POA: Diagnosis present

## 2015-02-02 DIAGNOSIS — N3289 Other specified disorders of bladder: Secondary | ICD-10-CM | POA: Diagnosis present

## 2015-02-02 DIAGNOSIS — R52 Pain, unspecified: Secondary | ICD-10-CM

## 2015-02-02 DIAGNOSIS — N3941 Urge incontinence: Secondary | ICD-10-CM | POA: Diagnosis present

## 2015-02-02 DIAGNOSIS — N289 Disorder of kidney and ureter, unspecified: Secondary | ICD-10-CM

## 2015-02-02 DIAGNOSIS — R319 Hematuria, unspecified: Secondary | ICD-10-CM

## 2015-02-02 DIAGNOSIS — B3749 Other urogenital candidiasis: Secondary | ICD-10-CM | POA: Diagnosis present

## 2015-02-02 DIAGNOSIS — D649 Anemia, unspecified: Secondary | ICD-10-CM

## 2015-02-02 HISTORY — DX: Malignant neoplasm of prostate: C61

## 2015-02-02 HISTORY — DX: Gastroparesis: K31.84

## 2015-02-02 HISTORY — DX: Gastrointestinal hemorrhage, unspecified: K92.2

## 2015-02-02 HISTORY — DX: Disorientation, unspecified: R41.0

## 2015-02-02 HISTORY — DX: Type 2 diabetes mellitus with diabetic autonomic (poly)neuropathy: E11.43

## 2015-02-02 LAB — LIPASE, BLOOD: LIPASE: 28 U/L (ref 11–59)

## 2015-02-02 LAB — COMPREHENSIVE METABOLIC PANEL
ALBUMIN: 3.1 g/dL — AB (ref 3.5–5.2)
ALT: 9 U/L (ref 0–53)
ANION GAP: 7 (ref 5–15)
AST: 16 U/L (ref 0–37)
Alkaline Phosphatase: 103 U/L (ref 39–117)
BUN: 57 mg/dL — AB (ref 6–23)
CALCIUM: 8.7 mg/dL (ref 8.4–10.5)
CO2: 21 mmol/L (ref 19–32)
CREATININE: 2.98 mg/dL — AB (ref 0.50–1.35)
Chloride: 106 mmol/L (ref 96–112)
GFR calc Af Amer: 21 mL/min — ABNORMAL LOW (ref >90)
GFR calc non Af Amer: 18 mL/min — ABNORMAL LOW (ref >90)
Glucose, Bld: 190 mg/dL — ABNORMAL HIGH (ref 70–99)
Potassium: 6.1 mmol/L (ref 3.5–5.1)
Sodium: 134 mmol/L — ABNORMAL LOW (ref 135–145)
TOTAL PROTEIN: 7.8 g/dL (ref 6.0–8.3)
Total Bilirubin: 0.5 mg/dL (ref 0.3–1.2)

## 2015-02-02 LAB — CBC WITH DIFFERENTIAL/PLATELET
BASOS ABS: 0 10*3/uL (ref 0.0–0.1)
Basophils Relative: 0 % (ref 0–1)
Eosinophils Absolute: 0.3 10*3/uL (ref 0.0–0.7)
Eosinophils Relative: 2 % (ref 0–5)
HCT: 26.9 % — ABNORMAL LOW (ref 39.0–52.0)
Hemoglobin: 8.9 g/dL — ABNORMAL LOW (ref 13.0–17.0)
LYMPHS PCT: 5 % — AB (ref 12–46)
Lymphs Abs: 0.8 10*3/uL (ref 0.7–4.0)
MCH: 29.2 pg (ref 26.0–34.0)
MCHC: 33.1 g/dL (ref 30.0–36.0)
MCV: 88.2 fL (ref 78.0–100.0)
MONO ABS: 0.8 10*3/uL (ref 0.1–1.0)
Monocytes Relative: 5 % (ref 3–12)
NEUTROS ABS: 13.3 10*3/uL — AB (ref 1.7–7.7)
Neutrophils Relative %: 88 % — ABNORMAL HIGH (ref 43–77)
PLATELETS: 252 10*3/uL (ref 150–400)
RBC: 3.05 MIL/uL — ABNORMAL LOW (ref 4.22–5.81)
RDW: 13.4 % (ref 11.5–15.5)
WBC: 15.2 10*3/uL — AB (ref 4.0–10.5)

## 2015-02-02 LAB — URINALYSIS, ROUTINE W REFLEX MICROSCOPIC
Bilirubin Urine: NEGATIVE
GLUCOSE, UA: 250 mg/dL — AB
Ketones, ur: NEGATIVE mg/dL
NITRITE: NEGATIVE
Protein, ur: 100 mg/dL — AB
Specific Gravity, Urine: 1.025 (ref 1.005–1.030)
Urobilinogen, UA: 0.2 mg/dL (ref 0.0–1.0)
pH: 5.5 (ref 5.0–8.0)

## 2015-02-02 LAB — URINE MICROSCOPIC-ADD ON

## 2015-02-02 LAB — TROPONIN I: Troponin I: <0.03 ng/mL (ref <0.031)

## 2015-02-02 LAB — MRSA PCR SCREENING: MRSA by PCR: POSITIVE — AB

## 2015-02-02 LAB — GLUCOSE, CAPILLARY: Glucose-Capillary: 219 mg/dL — ABNORMAL HIGH (ref 70–99)

## 2015-02-02 LAB — POTASSIUM: Potassium: 5.2 mmol/L — ABNORMAL HIGH (ref 3.5–5.1)

## 2015-02-02 LAB — LACTIC ACID, PLASMA: Lactic Acid, Venous: 0.9 mmol/L (ref 0.5–2.0)

## 2015-02-02 MED ORDER — ALUM & MAG HYDROXIDE-SIMETH 200-200-20 MG/5ML PO SUSP: 30.0000 mL | Freq: Four times a day (QID) | ORAL | Status: DC | PRN

## 2015-02-02 MED ORDER — MICONAZOLE NITRATE 2 % EX CREA
TOPICAL_CREAM | Freq: Two times a day (BID) | CUTANEOUS | Status: DC
  Filled 2015-02-02: qty 14

## 2015-02-02 MED ORDER — DEXTROSE 5 % IV SOLN
1.0000 g | INTRAVENOUS | Status: DC
  Administered 2015-02-03 – 2015-02-05 (×3): 1 g via INTRAVENOUS
  Filled 2015-02-02 (×4): qty 1

## 2015-02-02 MED ORDER — ENOXAPARIN SODIUM 30 MG/0.3ML ~~LOC~~ SOLN
30.0000 mg | SUBCUTANEOUS | Status: DC
  Administered 2015-02-02 – 2015-02-08 (×7): 30 mg via SUBCUTANEOUS
  Filled 2015-02-02 (×7): qty 0.3

## 2015-02-02 MED ORDER — ALBUTEROL (5 MG/ML) CONTINUOUS INHALATION SOLN
10.0000 mg/h | INHALATION_SOLUTION | Freq: Once | RESPIRATORY_TRACT | Status: AC
  Administered 2015-02-02: 10 mg/h via RESPIRATORY_TRACT
  Filled 2015-02-02: qty 20

## 2015-02-02 MED ORDER — CHLORHEXIDINE GLUCONATE CLOTH 2 % EX PADS
6.0000 | MEDICATED_PAD | Freq: Every day | CUTANEOUS | Status: AC
  Administered 2015-02-03 – 2015-02-07 (×5): 6 via TOPICAL

## 2015-02-02 MED ORDER — INSULIN ASPART 100 UNIT/ML ~~LOC~~ SOLN
0.0000 [IU] | Freq: Every day | SUBCUTANEOUS | Status: DC
  Administered 2015-02-02: 2 [IU] via SUBCUTANEOUS

## 2015-02-02 MED ORDER — ACETAMINOPHEN 325 MG RE SUPP
325.0000 mg | Freq: Once | RECTAL | Status: AC
  Administered 2015-02-02: 325 mg via RECTAL

## 2015-02-02 MED ORDER — KETOROLAC TROMETHAMINE 0.5 % OP SOLN
1.0000 [drp] | Freq: Three times a day (TID) | OPHTHALMIC | Status: DC
  Administered 2015-02-02 – 2015-02-09 (×20): 1 [drp] via OPHTHALMIC
  Filled 2015-02-02: qty 3

## 2015-02-02 MED ORDER — SENNOSIDES-DOCUSATE SODIUM 8.6-50 MG PO TABS
2.0000 | ORAL_TABLET | Freq: Every day | ORAL | Status: DC
  Administered 2015-02-02 – 2015-02-07 (×6): 2 via ORAL
  Filled 2015-02-02 (×7): qty 2

## 2015-02-02 MED ORDER — ONDANSETRON HCL 4 MG/2ML IJ SOLN
4.0000 mg | Freq: Once | INTRAMUSCULAR | Status: AC
  Administered 2015-02-02: 4 mg via INTRAVENOUS

## 2015-02-02 MED ORDER — CETYLPYRIDINIUM CHLORIDE 0.05 % MT LIQD
7.0000 mL | Freq: Two times a day (BID) | OROMUCOSAL | Status: DC
  Administered 2015-02-02 – 2015-02-09 (×12): 7 mL via OROMUCOSAL

## 2015-02-02 MED ORDER — MUPIROCIN 2 % EX OINT
1.0000 "application " | TOPICAL_OINTMENT | Freq: Two times a day (BID) | CUTANEOUS | Status: AC
  Administered 2015-02-02 – 2015-02-07 (×10): 1 via NASAL
  Filled 2015-02-02 (×2): qty 22

## 2015-02-02 MED ORDER — INSULIN ASPART 100 UNIT/ML ~~LOC~~ SOLN
0.0000 [IU] | Freq: Three times a day (TID) | SUBCUTANEOUS | Status: DC
  Administered 2015-02-04 – 2015-02-05 (×2): 2 [IU] via SUBCUTANEOUS
  Administered 2015-02-05 (×2): 1 [IU] via SUBCUTANEOUS
  Administered 2015-02-09: 2 [IU] via SUBCUTANEOUS

## 2015-02-02 MED ORDER — ACETAMINOPHEN 650 MG RE SUPP: 975.0000 mg | Freq: Once | RECTAL | Status: DC

## 2015-02-02 MED ORDER — VANCOMYCIN HCL IN DEXTROSE 1-5 GM/200ML-% IV SOLN
1000.0000 mg | Freq: Once | INTRAVENOUS | Status: AC
  Administered 2015-02-02: 1000 mg via INTRAVENOUS
  Filled 2015-02-02: qty 200

## 2015-02-02 MED ORDER — ENSURE COMPLETE PO LIQD
237.0000 mL | Freq: Two times a day (BID) | ORAL | Status: DC
  Administered 2015-02-03 – 2015-02-09 (×6): 237 mL via ORAL

## 2015-02-02 MED ORDER — ONDANSETRON HCL 4 MG/2ML IJ SOLN
4.0000 mg | Freq: Four times a day (QID) | INTRAMUSCULAR | Status: DC | PRN
  Administered 2015-02-03 – 2015-02-07 (×4): 4 mg via INTRAVENOUS
  Filled 2015-02-02 (×4): qty 2

## 2015-02-02 MED ORDER — ACETAMINOPHEN 325 MG PO TABS
650.0000 mg | ORAL_TABLET | Freq: Four times a day (QID) | ORAL | Status: DC | PRN
  Administered 2015-02-03 – 2015-02-08 (×5): 650 mg via ORAL
  Filled 2015-02-02 (×5): qty 2

## 2015-02-02 MED ORDER — INSULIN ASPART 100 UNIT/ML ~~LOC~~ SOLN
10.0000 [IU] | Freq: Once | SUBCUTANEOUS | Status: AC
  Administered 2015-02-02: 10 [IU] via INTRAVENOUS
  Filled 2015-02-02: qty 1

## 2015-02-02 MED ORDER — SODIUM CHLORIDE 0.9 % IV BOLUS (SEPSIS)
250.0000 mL | Freq: Once | INTRAVENOUS | Status: AC
  Administered 2015-02-02: 250 mL via INTRAVENOUS

## 2015-02-02 MED ORDER — ACETAMINOPHEN 650 MG RE SUPP: 650.0000 mg | Freq: Four times a day (QID) | RECTAL | Status: DC | PRN

## 2015-02-02 MED ORDER — ACETAMINOPHEN 650 MG RE SUPP
RECTAL | Status: AC
  Administered 2015-02-02: 325 mg via RECTAL
  Filled 2015-02-02: qty 1

## 2015-02-02 MED ORDER — CEFEPIME HCL 2 G IJ SOLR
2.0000 g | Freq: Three times a day (TID) | INTRAMUSCULAR | Status: DC
  Filled 2015-02-02 (×5): qty 2

## 2015-02-02 MED ORDER — DEXTROSE 50 % IV SOLN
50.0000 mL | Freq: Once | INTRAVENOUS | Status: AC
  Administered 2015-02-02: 50 mL via INTRAVENOUS

## 2015-02-02 MED ORDER — ACETAMINOPHEN 650 MG RE SUPP
650.0000 mg | Freq: Once | RECTAL | Status: AC
  Administered 2015-02-02: 650 mg via RECTAL

## 2015-02-02 MED ORDER — DOCUSATE SODIUM 100 MG PO CAPS: 100.0000 mg | ORAL_CAPSULE | Freq: Every day | ORAL | Status: DC | PRN

## 2015-02-02 MED ORDER — GABAPENTIN 300 MG PO CAPS
300.0000 mg | ORAL_CAPSULE | Freq: Every day | ORAL | Status: DC
  Administered 2015-02-02 – 2015-02-08 (×7): 300 mg via ORAL
  Filled 2015-02-02 (×7): qty 1

## 2015-02-02 MED ORDER — ONDANSETRON HCL 4 MG PO TABS: 4.0000 mg | ORAL_TABLET | Freq: Four times a day (QID) | ORAL | Status: DC | PRN

## 2015-02-02 MED ORDER — INSULIN DETEMIR 100 UNIT/ML ~~LOC~~ SOLN
25.0000 [IU] | Freq: Every day | SUBCUTANEOUS | Status: DC
  Administered 2015-02-02: 25 [IU] via SUBCUTANEOUS
  Filled 2015-02-02 (×2): qty 0.25

## 2015-02-02 MED ORDER — SODIUM CHLORIDE 0.9 % IV SOLN
INTRAVENOUS | Status: DC
  Administered 2015-02-03 – 2015-02-07 (×5): via INTRAVENOUS

## 2015-02-02 MED ORDER — ONDANSETRON HCL 4 MG/2ML IJ SOLN
INTRAMUSCULAR | Status: AC
  Administered 2015-02-02: 4 mg via INTRAVENOUS
  Filled 2015-02-02: qty 2

## 2015-02-02 MED ORDER — SODIUM CHLORIDE 0.9 % IV SOLN
INTRAVENOUS | Status: DC
  Administered 2015-02-02: 75 mL/h via INTRAVENOUS

## 2015-02-02 MED ORDER — ENOXAPARIN SODIUM 40 MG/0.4ML ~~LOC~~ SOLN: 40.0000 mg | SUBCUTANEOUS | Status: DC

## 2015-02-02 MED ORDER — POLYETHYLENE GLYCOL 3350 17 G PO PACK: 17.0000 g | PACK | Freq: Two times a day (BID) | ORAL | Status: DC | PRN

## 2015-02-02 MED ORDER — DEXTROSE 5 % IV SOLN
1.0000 g | Freq: Once | INTRAVENOUS | Status: AC
  Administered 2015-02-02: 1 g via INTRAVENOUS
  Filled 2015-02-02: qty 1

## 2015-02-02 MED ORDER — AMLODIPINE BESYLATE 5 MG PO TABS
5.0000 mg | ORAL_TABLET | Freq: Every day | ORAL | Status: DC
  Administered 2015-02-03 – 2015-02-06 (×3): 5 mg via ORAL
  Filled 2015-02-02 (×4): qty 1

## 2015-02-02 MED ORDER — VANCOMYCIN HCL IN DEXTROSE 1-5 GM/200ML-% IV SOLN
1000.0000 mg | INTRAVENOUS | Status: DC
  Administered 2015-02-04: 1000 mg via INTRAVENOUS
  Filled 2015-02-02 (×2): qty 200

## 2015-02-02 MED ORDER — DEXTROSE 50 % IV SOLN
INTRAVENOUS | Status: AC
  Filled 2015-02-02: qty 50

## 2015-02-02 MED ORDER — ATORVASTATIN CALCIUM 40 MG PO TABS
40.0000 mg | ORAL_TABLET | Freq: Every day | ORAL | Status: DC
  Administered 2015-02-03 – 2015-02-09 (×7): 40 mg via ORAL
  Filled 2015-02-02 (×7): qty 1

## 2015-02-02 NOTE — H&P (Signed)
History and Physical  Douglas [name] ZOX:[id number] DOB: 1931-04-14 DOA: 02/02/2015  Referring physician: Dr Thurnell Garbe, ED physician PCP: [name]., MD   Chief Complaint: Fever, Vomiting  HPI: Douglas [name] is a 81 y.o. male  With a history of vomiting, diabetes, hypertension, history of prostate cancer status post radiation, hyperlipidemia, recent fracture of the right femur status post intramedullary nailing. Patient was discharged from the skilled nursing facility yesterday, but had a complicated stay at the nursing facility that involved urinary retention. Patient currently has a Foley catheter placed. The patient's began having fevers and shaking chills and began to vomit this morning. The patient was brought to the hospital for evaluation. There is some concerns of dementia the patient is not reliable for history.    Review of Systems:   Patient unreliable  Past Medical History  Diagnosis Date  . Hypertension   . Diabetes mellitus   . Hyperlipidemia   . Cataracts, bilateral   . Hyperkalemia   . UTI (lower urinary tract infection)   . Calculus of kidney   . Sensorineural hearing loss, unspecified   . Epiglottitis   . Chronic ankle pain   . Neuromuscular disorder     neurophaty lower legs  . Upper GI bleeding     while at [facility] 12/2014  . Prostate cancer 2009    radiation  . Confusion    Past Surgical History  Procedure Laterality Date  . Eye surgery  sept. 2015    cataract surgery  . Hernia repair      severa; years can't remeber  . Femur im nail Right 01/18/2015    Procedure: INTRAMEDULLARY (IM) RETROGRADE FEMORAL NAILING;  Surgeon: Renette Butters, MD;  Location: [facility];  Service: Orthopedics;  Laterality: Right;   Social History:  reports that he has never smoked. He does not have any smokeless tobacco history on file. He reports that he does not drink alcohol or use illicit drugs. Patient lives at home, although was recently at the [facility] & is able to  participate in activities of daily living  No Known Allergies  No family history on file.  Family history of Hypertension  Prior to Admission medications   Medication Sig Start Date End Date Taking? Authorizing Provider  amLODipine (NORVASC) 5 MG tablet Take 1 tablet (5 mg total) by mouth daily. 11/17/14  Yes Kathie Dike, MD  atorvastatin (LIPITOR) 40 MG tablet Take 40 mg by mouth daily.   Yes Historical Provider, MD  gabapentin (NEURONTIN) 300 MG capsule Take 1 capsule (300 mg total) by mouth at bedtime. 01/21/15  Yes Shanker Kristeen Mans, MD  insulin aspart (NOVOLOG) 100 UNIT/ML injection insulin aspart (novoLOG) injection 0-15 Units 0-15 Units, Subcutaneous, 3 times daily with meals CBG 70 - 120: 0 units CBG 121 - 150: 2 units CBG 151 - 200: 3 units CBG 201 - 250: 5 units CBG 251 - 300: 8 units CBG 301 - 350: 11 units CBG 351 - 400: 15 units CBG > 400: call MD 01/21/15  Yes Shanker Kristeen Mans, MD  insulin detemir (LEVEMIR) 100 UNIT/ML injection Inject 0.25 mLs (25 Units total) into the skin at bedtime. 11/17/14  Yes Kathie Dike, MD  ketorolac (ACULAR) 0.5 % ophthalmic solution Place 1 drop into both eyes 3 (three) times daily.   Yes Historical Provider, MD  HYDROcodone-acetaminophen (NORCO) 5-325 MG per tablet Take one to two tablets by mouth every 6 hours as needed for pain Patient not taking: Reported on  02/02/2015 01/22/15   Blanchie Serve, MD  polyethylene glycol (MIRALAX / GLYCOLAX) packet Take 17 g by mouth 2 (two) times daily. 01/21/15   Shanker Kristeen Mans, MD  senna-docusate (SENOKOT-S) 8.6-50 MG per tablet Take 2 tablets by mouth at bedtime. 01/21/15   Shanker Kristeen Mans, MD    Physical Exam: BP 107/43 mmHg  Pulse 57  Temp(Src) 98.5 F (36.9 C) (Oral)  Resp 20  Ht 5\' 3"  (1.6 m)  Wt 62.46 kg (137 lb 11.2 oz)  BMI 24.40 kg/m2  SpO2 95%  General: Elderly Caucasian male. Awake and alert and oriented x3. No acute cardiopulmonary distress. Patient restless in bed Eyes: Pupils  equal, round, reactive to light. Extraocular muscles are intact. Sclerae anicteric and noninjected.  ENT: Moist mucosal membranes. No mucosal lesions.   Neck: Neck supple without lymphadenopathy. No carotid bruits. No masses palpated.  Cardiovascular: Regular rate with normal S1-S2 sounds. No murmurs, rubs, gallops auscultated. No JVD.  Respiratory: Good respiratory effort. Mild Rales in the left base  Abdomen: Soft, mild epigastric and left upper quadrant discomfort, nondistended. Active bowel sounds. No masses or hepatosplenomegaly  Skin: Dry, warm to touch. 2+ dorsalis pedis and radial pulses. Redness around the glans of the penis. Musculoskeletal: No calf or leg pain. All major joints not erythematous nontender.  Psychiatric: Unable to determine. Patient not able to relay history although answers simple questions.  Neurologic: No focal neurological deficits. Cranial nerves II through XII are grossly intact.           Labs on Admission:  Basic Metabolic Panel:  Recent Labs Lab 01/30/15 0800 02/01/15 0901 02/02/15 1458  NA 140 136 134*  K 5.1 5.0 6.1*  CL 112 112 106  CO2 20 19 21   GLUCOSE 214* 177* 190*  BUN 63* 56* 57*  CREATININE 2.99* 2.84* 2.98*  CALCIUM 8.4 8.5 8.7   Liver Function Tests:  Recent Labs Lab 02/02/15 1458  AST 16  ALT 9  ALKPHOS 103  BILITOT 0.5  PROT 7.8  ALBUMIN 3.1*    Recent Labs Lab 02/02/15 1458  LIPASE 28   No results for input(s): AMMONIA in the last 168 hours. CBC:  Recent Labs Lab 01/30/15 0800 02/01/15 0901 02/02/15 1458  WBC 8.4 10.6* 15.2*  NEUTROABS 5.8 7.7 13.3*  HGB 8.7* 9.0* 8.9*  HCT 27.3* 27.8* 26.9*  MCV 89.2 88.5 88.2  PLT 251 309 252   Cardiac Enzymes:  Recent Labs Lab 02/02/15 1458  TROPONINI <0.03    BNP (last 3 results) No results for input(s): BNP in the last 8760 hours.  ProBNP (last 3 results) No results for input(s): PROBNP in the last 8760 hours.  CBG:  Recent Labs Lab 02/01/15 0758  02/01/15 1134 02/01/15 1137 02/01/15 1453 02/01/15 1829  GLUCAP 168* 336* 267* 389* 328*    Radiological Exams on Admission: Dg Chest Port 1 View  02/02/2015   CLINICAL DATA:  Fever, hematuria.  EXAM: PORTABLE CHEST - 1 VIEW  COMPARISON:  01/24/2015.  FINDINGS: Trachea is midline. Heart size is grossly stable. Lungs are low in volume with mild bibasilar atelectasis. Difficult to exclude developing airspace opacification in the left lower lobe. No definite pleural fluid.  IMPRESSION: Low lung volumes with bibasilar atelectasis. Difficult to exclude developing pneumonia in the left lower lobe.   Electronically Signed   By: Lorin Picket M.D.   On: 02/02/2015 15:09    EKG: Independently reviewed. Sinus tachycardia. Normal QRS and PR intervals. No acute ST changes  Assessment/Plan Present on Admission:  . Fever . HCAP (healthcare-associated pneumonia) . Urinary tract infectious disease . Yeast dermatitis of penis . Hyperkalemia  #1 fever #2 health care associated pneumonia #3 urinary tract infection #4 hyperkalemia #5 yeast dermatitis of the glans #6 diabetes #7 hypertension #8 urinary retention  Admit to MedSurg Continue vancomycin and cefepime MRSA by PCR screening - if negative may be able to eliminate vancomycin Remove Foley catheter and see if the patient will be able to void. If the patient's unable to void and continues to have urinary retention, may need to place Foley and obtain urology consult. Recheck potassium Miconazole for yeast dermatitis Continue insulin Continue antihypertensives  DVT prophylaxis: Lovenox  Consultants: None  Code Status: Full code  Family Communication: Wife and daughters in the room   Disposition Plan: Pending  Time spent: 70 minutes was spent with face-to-face time with patient with at least 50% with counseling and coordination of care  Truett Mainland, DO [name] Pager [phone]

## 2015-02-02 NOTE — ED Notes (Signed)
CRITICAL VALUE ALERT  Critical value received: Potassium 6.1  Date of notification:  02/02/2015  Time of notification:  2481  Critical value read back:Yes.    Nurse who received alert:  Ernestene Mention

## 2015-02-02 NOTE — ED Notes (Signed)
EMS reports pt has been vomiting and has been confused and running fevers. Pt just got home form rehab facility today for a femur fx.

## 2015-02-02 NOTE — ED Provider Notes (Signed)
CSN: [id number]     Arrival date & time 02/02/15  1413 History   First MD Initiated Contact with Patient 02/02/15 1433     Chief Complaint  Patient presents with  . Altered Mental Status      Patient is a 81 y.o. male presenting with altered mental status. The history is provided by the patient, the spouse and the EMS personnel. The history is limited by the condition of the patient (Hx confusion).  Altered Mental Status Pt was seen at 1435. Per EMS, pt's wife, and pt's report: Pt has had several intermittent episodes of N/V since this morning. Pt's wife states pt has been "more confused than normal" and "running a fever" today. Pt just arrived home from rehab today (s/p femur fx). Denies black or blood in stools or emesis, no abd pain, no CP/SOB, no falls.    Past Medical History  Diagnosis Date  . Hypertension   . Diabetes mellitus   . Hyperlipidemia   . Cataracts, bilateral   . Hyperkalemia   . UTI (lower urinary tract infection)   . Calculus of kidney   . Sensorineural hearing loss, unspecified   . Epiglottitis   . Chronic ankle pain   . Neuromuscular disorder     neurophaty lower legs  . Cancer     prostate and had radiation done   Past Surgical History  Procedure Laterality Date  . Eye surgery  sept. 2015    cataract surgery  . Hernia repair      severa; years can't remeber  . Femur im nail Right 01/18/2015    Procedure: INTRAMEDULLARY (IM) RETROGRADE FEMORAL NAILING;  Surgeon: Renette Butters, MD;  Location: [facility];  Service: Orthopedics;  Laterality: Right;    History  Substance Use Topics  . Smoking status: Never Smoker   . Smokeless tobacco: Not on file  . Alcohol Use: No    Review of Systems  Unable to perform ROS: Dementia      Allergies  Review of patient's allergies indicates no known allergies.  Home Medications   Prior to Admission medications   Medication Sig Start Date End Date Taking? Authorizing Provider  amLODipine (NORVASC) 5 MG tablet  Take 1 tablet (5 mg total) by mouth daily. 11/17/14   Kathie Dike, MD  atorvastatin (LIPITOR) 40 MG tablet Take 40 mg by mouth daily.    Historical Provider, MD  gabapentin (NEURONTIN) 300 MG capsule Take 1 capsule (300 mg total) by mouth at bedtime. 01/21/15   Shanker Kristeen Mans, MD  HYDROcodone-acetaminophen (NORCO) 5-325 MG per tablet Take one to two tablets by mouth every 6 hours as needed for pain 01/22/15   Blanchie Serve, MD  insulin aspart (NOVOLOG) 100 UNIT/ML injection insulin aspart (novoLOG) injection 0-15 Units 0-15 Units, Subcutaneous, 3 times daily with meals CBG 70 - 120: 0 units CBG 121 - 150: 2 units CBG 151 - 200: 3 units CBG 201 - 250: 5 units CBG 251 - 300: 8 units CBG 301 - 350: 11 units CBG 351 - 400: 15 units CBG > 400: call MD 01/21/15   Jonetta Osgood, MD  insulin detemir (LEVEMIR) 100 UNIT/ML injection Inject 0.25 mLs (25 Units total) into the skin at bedtime. 11/17/14   Kathie Dike, MD  ketorolac (ACULAR) 0.5 % ophthalmic solution Place 1 drop into both eyes 3 (three) times daily.    Historical Provider, MD  polyethylene glycol (MIRALAX / GLYCOLAX) packet Take 17 g by mouth 2 (two) times daily.  01/21/15   Shanker Kristeen Mans, MD  senna-docusate (SENOKOT-S) 8.6-50 MG per tablet Take 2 tablets by mouth at bedtime. 01/21/15   Shanker Kristeen Mans, MD   BP 190/72 mmHg  Pulse 119  Temp(Src) 102.4 F (39.1 C) (Rectal)  Resp 20  Wt 144 lb (65.318 kg)  SpO2 95% Physical Exam  1440: Physical examination:  Nursing notes reviewed; Vital signs and O2 SAT reviewed; +febrile.;; Constitutional: Well developed, Well nourished, In no acute distress; Head:  Normocephalic, atraumatic; Eyes: EOMI, PERRL, No scleral icterus; ENMT: Mouth and pharynx normal, Mucous membranes dry; Neck: Supple, Full range of motion, No lymphadenopathy; Cardiovascular: Tachycardic rate and rhythm, No gallop; Respiratory: Breath sounds clear & equal bilaterally, No wheezes.  Speaking sentences, Normal  respiratory effort/excursion; Chest: Nontender, Movement normal; Abdomen: Soft, Nontender, Nondistended, Normal bowel sounds; Genitourinary: No CVA tenderness. +foley catheter in place with blood tinged yellow urine; Extremities: Pulses normal, +RLE in brace. No edema, No calf edema or asymmetry.; Neuro: Awake, alert, confused per baseline. No facial droop. Major CN grossly intact.  Speech clear. +RLE in brace, otherwise pt moves extremities spontaneously on stretcher..; Skin: Color normal, Warm, Dry.   ED Course  Procedures     EKG Interpretation None      MDM  MDM Reviewed: previous chart, nursing note and vitals Reviewed previous: labs and ECG Interpretation: labs, ECG and x-ray Total time providing critical care: 30-74 minutes. This excludes time spent performing separately reportable procedures and services. Consults: admitting MD   CRITICAL CARE Performed by: Alfonzo Feller Total critical care time: 35 Critical care time was exclusive of separately billable procedures and treating other patients. Critical care was necessary to treat or prevent imminent or life-threatening deterioration. Critical care was time spent personally by me on the following activities: development of treatment plan with patient and/or surrogate as well as nursing, discussions with consultants, evaluation of patient's response to treatment, examination of patient, obtaining history from patient or surrogate, ordering and performing treatments and interventions, ordering and review of laboratory studies, ordering and review of radiographic studies, pulse oximetry and re-evaluation of patient's condition.    Results for orders placed or performed during the hospital encounter of 02/02/15  Urinalysis, Routine w reflex microscopic  Result Value Ref Range   Color, Urine YELLOW YELLOW   APPearance CLOUDY (A) CLEAR   Specific Gravity, Urine 1.025 1.005 - 1.030   pH 5.5 5.0 - 8.0   Glucose, UA 250 (A)  NEGATIVE mg/dL   Hgb urine dipstick LARGE (A) NEGATIVE   Bilirubin Urine NEGATIVE NEGATIVE   Ketones, ur NEGATIVE NEGATIVE mg/dL   Protein, ur 100 (A) NEGATIVE mg/dL   Urobilinogen, UA 0.2 0.0 - 1.0 mg/dL   Nitrite NEGATIVE NEGATIVE   Leukocytes, UA TRACE (A) NEGATIVE  CBC with Differential/Platelet  Result Value Ref Range   WBC 15.2 (H) 4.0 - 10.5 K/uL   RBC 3.05 (L) 4.22 - 5.81 MIL/uL   Hemoglobin 8.9 (L) 13.0 - 17.0 g/dL   HCT 26.9 (L) 39.0 - 52.0 %   MCV 88.2 78.0 - 100.0 fL   MCH 29.2 26.0 - 34.0 pg   MCHC 33.1 30.0 - 36.0 g/dL   RDW 13.4 11.5 - 15.5 %   Platelets 252 150 - 400 K/uL   Neutrophils Relative % 88 (H) 43 - 77 %   Lymphocytes Relative 5 (L) 12 - 46 %   Monocytes Relative 5 3 - 12 %   Eosinophils Relative 2 0 - 5 %  Basophils Relative 0 0 - 1 %   Neutro Abs 13.3 (H) 1.7 - 7.7 K/uL   Lymphs Abs 0.8 0.7 - 4.0 K/uL   Monocytes Absolute 0.8 0.1 - 1.0 K/uL   Eosinophils Absolute 0.3 0.0 - 0.7 K/uL   Basophils Absolute 0.0 0.0 - 0.1 K/uL   RBC Morphology POLYCHROMASIA PRESENT    WBC Morphology TOXIC GRANULATION   Comprehensive metabolic panel  Result Value Ref Range   Sodium 134 (L) 135 - 145 mmol/L   Potassium 6.1 (HH) 3.5 - 5.1 mmol/L   Chloride 106 96 - 112 mmol/L   CO2 21 19 - 32 mmol/L   Glucose, Bld 190 (H) 70 - 99 mg/dL   BUN 57 (H) 6 - 23 mg/dL   Creatinine, Ser 2.98 (H) 0.50 - 1.35 mg/dL   Calcium 8.7 8.4 - 10.5 mg/dL   Total Protein 7.8 6.0 - 8.3 g/dL   Albumin 3.1 (L) 3.5 - 5.2 g/dL   AST 16 0 - 37 U/L   ALT 9 0 - 53 U/L   Alkaline Phosphatase 103 39 - 117 U/L   Total Bilirubin 0.5 0.3 - 1.2 mg/dL   GFR calc non Af Amer 18 (L) >90 mL/min   GFR calc Af Amer 21 (L) >90 mL/min   Anion gap 7 5 - 15  Lactic acid, plasma  Result Value Ref Range   Lactic Acid, Venous 0.9 0.5 - 2.0 mmol/L  Troponin I  Result Value Ref Range   Troponin I <0.03 <0.031 ng/mL  Lipase, blood  Result Value Ref Range   Lipase 28 11 - 59 U/L  Urine microscopic-add on   Result Value Ref Range   WBC, UA 7-10 <3 WBC/hpf   RBC / HPF TOO NUMEROUS TO COUNT <3 RBC/hpf   Bacteria, UA MANY (A) RARE    Dg Chest Port 1 View 02/02/2015   CLINICAL DATA:  Fever, hematuria.  EXAM: PORTABLE CHEST - 1 VIEW  COMPARISON:  01/24/2015.  FINDINGS: Trachea is midline. Heart size is grossly stable. Lungs are low in volume with mild bibasilar atelectasis. Difficult to exclude developing airspace opacification in the left lower lobe. No definite pleural fluid.  IMPRESSION: Low lung volumes with bibasilar atelectasis. Difficult to exclude developing pneumonia in the left lower lobe.   Electronically Signed   By: Lorin Picket M.D.   On: 02/02/2015 15:09     1630:  Fever tx with APAP. BUN/Cr elevated, but near pt's baseline, per EPIC chart review. H/H also per baseline. UC and BC pending. +UTI and HCAP; will tx with IV abx. Hyperkalemia tx with albuterol 10mg  neb, IV D50 and IV insulin. Pt's VS remain stable. He is awake/alert, talking with family at bedside. ED staff note pt does "tug" on his foley catheter frequently. Pt also appears to have fungal rash on perineal area. Dx and testing d/w pt and family.  Questions answered.  Verb understanding, agreeable to admit.  T/C to [facility]. Dr. Nehemiah Settle, case discussed, including:  HPI, pertinent PM/SHx, VS/PE, dx testing, ED course and treatment:  Agreeable to admit, requests to write temporary orders, obtain medical bed to team APAdmits.   Francine Graven, DO 02/04/15 2115

## 2015-02-02 NOTE — Progress Notes (Signed)
ANTIBIOTIC CONSULT NOTE  Pharmacy Consult for Vancomycin Indication: pneumonia  No Known Allergies  Patient Measurements: Height: 5\' 3"  (160 cm) Weight: 137 lb 11.2 oz (62.46 kg) IBW/kg (Calculated) : 56.9  Vital Signs: Temp: 98.5 F (36.9 C) (02/06 1736) Temp Source: Oral (02/06 1736) BP: 107/43 mmHg (02/06 1736) Pulse Rate: 57 (02/06 1736) Intake/Output from previous day:   Intake/Output from this shift:    Labs:  Recent Labs  02/01/15 0901 02/02/15 1458  WBC 10.6* 15.2*  HGB 9.0* 8.9*  PLT 309 252  CREATININE 2.84* 2.98*   Estimated Creatinine Clearance: 15.6 mL/min (by C-G formula based on Cr of 2.98). No results for input(s): VANCOTROUGH, VANCOPEAK, VANCORANDOM, GENTTROUGH, GENTPEAK, GENTRANDOM, TOBRATROUGH, TOBRAPEAK, TOBRARND, AMIKACINPEAK, AMIKACINTROU, AMIKACIN in the last 72 hours.   Microbiology: Recent Results (from the past 720 hour(s))  Surgical pcr screen     Status: None   Collection Time: 01/18/15  5:32 AM  Result Value Ref Range Status   MRSA, PCR NEGATIVE NEGATIVE Final   Staphylococcus aureus NEGATIVE NEGATIVE Final    Comment:        The Xpert SA Assay (FDA approved for NASAL specimens in patients over 25 years of age), is one component of a comprehensive surveillance program.  Test performance has been validated by [facility] for patients greater than or equal to 1 year old. It is not intended to diagnose infection nor to guide or monitor treatment.     Anti-infectives    Start     Dose/Rate Route Frequency Ordered Stop   02/04/15 1600  vancomycin (VANCOCIN) IVPB 1000 mg/200 mL premix     1,000 mg200 mL/hr over 60 Minutes Intravenous Every 48 hours 02/02/15 2117     02/03/15 1600  ceFEPIme (MAXIPIME) 1 g in dextrose 5 % 50 mL IVPB     1 g100 mL/hr over 30 Minutes Intravenous Every 24 hours 02/02/15 2117     02/03/15 0000  ceFEPIme (MAXIPIME) 2 g in dextrose 5 % 50 mL IVPB  Status:  Discontinued     2 g100 mL/hr over 30 Minutes  Intravenous Every 8 hours 02/02/15 1911 02/02/15 2117   02/02/15 1630  vancomycin (VANCOCIN) IVPB 1000 mg/200 mL premix     1,000 mg200 mL/hr over 60 Minutes Intravenous  Once 02/02/15 1623 02/02/15 1738   02/02/15 1600  ceFEPIme (MAXIPIME) 1 g in dextrose 5 % 50 mL IVPB     1 g100 mL/hr over 30 Minutes Intravenous  Once 02/02/15 1550 02/02/15 1639      Assessment: 81 yoM who presented with fevers and vomiting.  He was discharged from rehab facility to home today after hospitalization & inpt rehab s/p hip fracture repair.  He is noted to have UTI and CXR + developing PNA.  He was febrile on admission (Tmax 102.40F) with elevated WBC.  Lactic acid is normal.  Chronic renal insufficiency noted.  Estimated CrCl ~ 15-79ml/min.  Vancomycin 2/6>> Cefepime 2/6>>   Goal of Therapy:  Vancomycin trough level 15-20 mcg/ml  Plan:  Vancomycin 1gm IV q48h Check Vancomycin trough at steady state Monitor renal function and cx data  Decrease Cefepime to 1gm IV q24h for renal function  Douglas [name] 02/02/2015,9:18 PM

## 2015-02-02 NOTE — ED Notes (Signed)
Pt vomitied x 1 here, emesis was green no blood present

## 2015-02-02 NOTE — Progress Notes (Signed)
CRITICAL VALUE ALERT  Critical value received:  MRSA PCR positive  Date of notification:  02/02/15  Time of notification:  2206  Critical value read back:Yes.    Nurse who received alert:  A. Stann Mainland, RN  MD notified (1st page): Dr. Nehemiah Settle text-paged to notify. MRSA PCR positive standing orders protocol in place. Notified Doris, RN patients nurse.   Time of first page:  2208  MD notified (2nd page):  Time of second page:  Responding MD:    Time MD responded:

## 2015-02-03 LAB — GLUCOSE, CAPILLARY
GLUCOSE-CAPILLARY: 67 mg/dL — AB (ref 70–99)
GLUCOSE-CAPILLARY: 86 mg/dL (ref 70–99)
Glucose-Capillary: 69 mg/dL — ABNORMAL LOW (ref 70–99)
Glucose-Capillary: 99 mg/dL (ref 70–99)
Glucose-Capillary: 115 mg/dL — ABNORMAL HIGH (ref 70–99)
Glucose-Capillary: 154 mg/dL — ABNORMAL HIGH (ref 70–99)

## 2015-02-03 LAB — BASIC METABOLIC PANEL
Anion gap: 2 — ABNORMAL LOW (ref 5–15)
BUN: 62 mg/dL — ABNORMAL HIGH (ref 6–23)
CO2: 22 mmol/L (ref 19–32)
Calcium: 8.2 mg/dL — ABNORMAL LOW (ref 8.4–10.5)
Chloride: 111 mmol/L (ref 96–112)
Creatinine, Ser: 3.32 mg/dL — ABNORMAL HIGH (ref 0.50–1.35)
GFR, EST AFRICAN AMERICAN: 19 mL/min — AB (ref >90)
GFR, EST NON AFRICAN AMERICAN: 16 mL/min — AB (ref >90)
GLUCOSE: 69 mg/dL — AB (ref 70–99)
Potassium: 5.3 mmol/L — ABNORMAL HIGH (ref 3.5–5.1)
SODIUM: 135 mmol/L (ref 135–145)

## 2015-02-03 LAB — CBC
HCT: 24.3 % — ABNORMAL LOW (ref 39.0–52.0)
Hemoglobin: 7.7 g/dL — ABNORMAL LOW (ref 13.0–17.0)
MCH: 28.3 pg (ref 26.0–34.0)
MCHC: 31.7 g/dL (ref 30.0–36.0)
MCV: 89.3 fL (ref 78.0–100.0)
Platelets: 240 10*3/uL (ref 150–400)
RBC: 2.72 MIL/uL — ABNORMAL LOW (ref 4.22–5.81)
RDW: 13.7 % (ref 11.5–15.5)
WBC: 11.1 10*3/uL — ABNORMAL HIGH (ref 4.0–10.5)

## 2015-02-03 MED ORDER — CLOTRIMAZOLE 1 % EX CREA
TOPICAL_CREAM | Freq: Two times a day (BID) | CUTANEOUS | Status: DC
  Administered 2015-02-03 – 2015-02-04 (×4): via TOPICAL
  Administered 2015-02-05: 1 via TOPICAL
  Administered 2015-02-05 – 2015-02-08 (×7): via TOPICAL
  Administered 2015-02-09: 1 via TOPICAL
  Filled 2015-02-03: qty 15

## 2015-02-03 MED ORDER — TRAMADOL HCL 50 MG PO TABS
100.0000 mg | ORAL_TABLET | Freq: Four times a day (QID) | ORAL | Status: DC | PRN
  Administered 2015-02-03 – 2015-02-08 (×5): 100 mg via ORAL
  Filled 2015-02-03 (×5): qty 2

## 2015-02-03 MED ORDER — SODIUM POLYSTYRENE SULFONATE 15 GM/60ML PO SUSP
15.0000 g | Freq: Once | ORAL | Status: AC
  Administered 2015-02-03: 15 g via ORAL
  Filled 2015-02-03: qty 60

## 2015-02-03 NOTE — Progress Notes (Addendum)
[name] PROGRESS NOTE  Douglas [name] [name]:[id number] DOB: [birth year] DOA: 02/02/2015 PCP: [name]., MD Interim summary: Douglas [name] is a 81 y.o. male  With a history of vomiting, diabetes, hypertension, history of prostate cancer status post radiation, hyperlipidemia, recent fracture of the right femur status post intramedullary nailing. Patient was discharged from the skilled nursing facility yesterday, comes in for nausea, vomiting and fever. CXR revealed questionable early pneumonia. He was admitted for health care associated pneumonia.  Assessment/Plan: 1. Fever chills and questionable  Health care associated pneumonia: Started on IV vancomycin and cefepime.  Nasal oxygen as needed.   2. Urinary retention: Resolved.   3. Diabetes mellitus: CBG (last 3)   Recent Labs  02/03/15 0805 02/03/15 0837 02/03/15 1148  GLUCAP 69* 86 99    cbg's running borderline. Stop lantus and Get [id number].   4. Nausea, vomiting : Resolved. Unclear etiology.    Constipation: started on colace and miralax.   Recent right femur fracture: physical therapy.   Abnormal UA: Urine cultures ordered and pending.   DVT prophylaxis.    Code Status: full code.  Family Communication: family at bedside Disposition Plan: pending further testing.    Consultants: Physical therapy Procedures:  none  Antibiotics:  VANCOMYCIN  cefepime  HPI/Subjective: No new complaints.   Objective: Filed Vitals:   02/03/15 1515  BP: 122/51  Pulse: 83  Temp: 98.6 F (37 C)  Resp: 20    Intake/Output Summary (Last 24 hours) at 02/03/15 1604 Last data filed at 02/03/15 1201  Gross per 24 hour  Intake    240 ml  Output    850 ml  Net   -610 ml   Filed Weights   02/02/15 1400 02/02/15 1736  Weight: 65.318 kg (144 lb) 62.46 kg (137 lb 11.2 oz)    Exam:   General:  Alert afebrile comfortable.   Cardiovascular: s1s2  Respiratory: clear to ausculation, no wheezing or  rhonchi,   Abdomen: soft , non tender non distended bowel sounds heard  Musculoskeletal: left heel pressure ulcer. No pedal edema.   Data Reviewed: Basic Metabolic Panel:  Recent Labs Lab 01/30/15 0800 02/01/15 0901 02/02/15 1458 02/02/15 1930 02/03/15 0619  NA 140 136 134*  --  135  K 5.1 5.0 6.1* 5.2* 5.3*  CL 112 112 106  --  111  CO2 20 19 21   --  22  GLUCOSE 214* 177* 190*  --  69*  BUN 63* 56* 57*  --  62*  CREATININE 2.99* 2.84* 2.98*  --  3.32*  CALCIUM 8.4 8.5 8.7  --  8.2*   Liver Function Tests:  Recent Labs Lab 02/02/15 1458  AST 16  ALT 9  ALKPHOS 103  BILITOT 0.5  PROT 7.8  ALBUMIN 3.1*    Recent Labs Lab 02/02/15 1458  LIPASE 28   No results for input(s): AMMONIA in the last 168 hours. CBC:  Recent Labs Lab 01/30/15 0800 02/01/15 0901 02/02/15 1458 02/03/15 0619  WBC 8.4 10.6* 15.2* 11.1*  NEUTROABS 5.8 7.7 13.3*  --   HGB 8.7* 9.0* 8.9* 7.7*  HCT 27.3* 27.8* 26.9* 24.3*  MCV 89.2 88.5 88.2 89.3  PLT 251 309 252 240   Cardiac Enzymes:  Recent Labs Lab 02/02/15 1458  TROPONINI <0.03   BNP (last 3 results) No results for input(s): BNP in the last 8760 hours.  ProBNP (last 3 results) No results for input(s): PROBNP in the last 8760 hours.  CBG:  Recent Labs  Lab 02/02/15 2114 02/03/15 0722 02/03/15 0805 02/03/15 0837 02/03/15 1148  GLUCAP 219* 67* 69* 86 99    Recent Results (from the past 240 hour(s))  MRSA PCR Screening     Status: Abnormal   Collection Time: 02/02/15  7:47 PM  Result Value Ref Range Status   MRSA by PCR POSITIVE (A) NEGATIVE Final    Comment: CRITICAL RESULT CALLED TO, READ BACK BY AND VERIFIED WITH: ROGERS,A AT 10:10PM ON 02/02/15 BY FESTERMAN,C        The GeneXpert MRSA Assay (FDA approved for NASAL specimens only), is one component of a comprehensive MRSA colonization surveillance program. It is not intended to diagnose MRSA infection nor to guide or monitor treatment for MRSA  infections.      Studies: Dg Chest Port 1 View  02/02/2015   CLINICAL DATA:  Fever, hematuria.  EXAM: PORTABLE CHEST - 1 VIEW  COMPARISON:  01/24/2015.  FINDINGS: Trachea is midline. Heart size is grossly stable. Lungs are low in volume with mild bibasilar atelectasis. Difficult to exclude developing airspace opacification in the left lower lobe. No definite pleural fluid.  IMPRESSION: Low lung volumes with bibasilar atelectasis. Difficult to exclude developing pneumonia in the left lower lobe.   Electronically Signed   By: Lorin Picket M.D.   On: 02/02/2015 15:09    Scheduled Meds: . amLODipine  5 mg Oral Daily  . antiseptic oral rinse  7 mL Mouth Rinse BID  . atorvastatin  40 mg Oral Daily  . ceFEPime (MAXIPIME) IV  1 g Intravenous Q24H  . Chlorhexidine Gluconate Cloth  6 each Topical Q0600  . clotrimazole   Topical BID  . enoxaparin (LOVENOX) injection  30 mg Subcutaneous Q24H  . feeding supplement (ENSURE COMPLETE)  237 mL Oral BID BM  . gabapentin  300 mg Oral QHS  . insulin aspart  0-5 Units Subcutaneous QHS  . insulin aspart  0-9 Units Subcutaneous TID WC  . insulin detemir  25 Units Subcutaneous QHS  . ketorolac  1 drop Both Eyes TID  . mupirocin ointment  1 application Nasal BID  . senna-docusate  2 tablet Oral QHS  . [START ON 02/04/2015] vancomycin  1,000 mg Intravenous Q48H   Continuous Infusions: . sodium chloride 100 mL/hr at 02/03/15 1549    Active Problems:   Hyperkalemia   Fever   HCAP (healthcare-associated pneumonia)   Urinary tract infectious disease   Yeast dermatitis of penis    Time spent: 25 min    [facility] Pager [phone], please contact night-coverage at [email or web address], password [facility] 02/03/2015, 4:04 PM  LOS: 1 day

## 2015-02-03 NOTE — Progress Notes (Signed)
After orange juice recheck of BS was 69.  Pt is not symptomatic. Gram crackers and peanut butter enjoyed prior to breakfast tray Will recheck BS

## 2015-02-03 NOTE — Progress Notes (Signed)
Pt Blood sugar 67 this am.  Pt is alert oriented without s/s.  Orange juice given

## 2015-02-04 LAB — BASIC METABOLIC PANEL
Anion gap: 9 (ref 5–15)
Anion gap: 4 — ABNORMAL LOW (ref 5–15)
BUN: 63 mg/dL — AB (ref 6–23)
BUN: 60 mg/dL — AB (ref 6–23)
CALCIUM: 8 mg/dL — AB (ref 8.4–10.5)
CO2: 19 mmol/L (ref 19–32)
CO2: 22 mmol/L (ref 19–32)
Calcium: 8 mg/dL — ABNORMAL LOW (ref 8.4–10.5)
Chloride: 109 mmol/L (ref 96–112)
Chloride: 111 mmol/L (ref 96–112)
Creatinine, Ser: 3.1 mg/dL — ABNORMAL HIGH (ref 0.50–1.35)
Creatinine, Ser: 2.95 mg/dL — ABNORMAL HIGH (ref 0.50–1.35)
GFR calc Af Amer: 21 mL/min — ABNORMAL LOW (ref >90)
GFR calc non Af Amer: 17 mL/min — ABNORMAL LOW (ref >90)
GFR, EST AFRICAN AMERICAN: 20 mL/min — AB (ref >90)
GFR, EST NON AFRICAN AMERICAN: 19 mL/min — AB (ref >90)
GLUCOSE: 164 mg/dL — AB (ref 70–99)
Glucose, Bld: 165 mg/dL — ABNORMAL HIGH (ref 70–99)
POTASSIUM: 5.5 mmol/L — AB (ref 3.5–5.1)
Potassium: 5.5 mmol/L — ABNORMAL HIGH (ref 3.5–5.1)
SODIUM: 137 mmol/L (ref 135–145)
Sodium: 137 mmol/L (ref 135–145)

## 2015-02-04 LAB — HEMOGLOBIN AND HEMATOCRIT, BLOOD
HEMATOCRIT: 23.5 % — AB (ref 39.0–52.0)
Hemoglobin: 7.6 g/dL — ABNORMAL LOW (ref 13.0–17.0)

## 2015-02-04 MED ORDER — SODIUM POLYSTYRENE SULFONATE 15 GM/60ML PO SUSP
15.0000 g | Freq: Once | ORAL | Status: AC
  Administered 2015-02-04: 15 g via ORAL
  Filled 2015-02-04: qty 60

## 2015-02-04 MED ORDER — INSULIN ASPART 100 UNIT/ML IV SOLN
10.0000 [IU] | Freq: Once | INTRAVENOUS | Status: AC
  Administered 2015-02-04: 10 [IU] via INTRAVENOUS

## 2015-02-04 MED ORDER — SODIUM CHLORIDE 0.9 % IV SOLN
1.0000 g | Freq: Once | INTRAVENOUS | Status: AC
  Administered 2015-02-04: 1 g via INTRAVENOUS
  Filled 2015-02-04: qty 10

## 2015-02-04 MED ORDER — DEXTROSE 50 % IV SOLN
1.0000 | Freq: Once | INTRAVENOUS | Status: AC
  Administered 2015-02-04: 50 mL via INTRAVENOUS

## 2015-02-04 MED ORDER — DEXTROSE 50 % IV SOLN
INTRAVENOUS | Status: AC
  Administered 2015-02-04: 16:00:00
  Filled 2015-02-04: qty 50

## 2015-02-04 NOTE — Progress Notes (Signed)
SLP Cancellation Note  Patient Details Name: Douglas [name] MRN: [id number] DOB: 1931/04/04   Cancelled treatment:       Reason Eval/Treat Not Completed: Other (comment); Consult received for clinical swallow evaluation. Chart reviewed. SLP unable to complete this PM, plan for swallow evaluation in the AM.   Thank you,  Genene Churn, [place]    [place] 02/04/2015, 4:15 PM

## 2015-02-04 NOTE — Clinical Social Work Note (Signed)
Referred to CSW today for ?SNF. Chart reviewed and have spoken with wife who states he was recently released from [facility] but she is planning to take him home at dc with [facility]. Wife concerned that he not be dc'ed too soon- Spoke with MD and have updated RNCM who will assist with [facility] and DME. CSW to sign off- please contact us if SW needs arise. Eduard Clos, MSW, [place]

## 2015-02-04 NOTE — Evaluation (Signed)
Physical Therapy Evaluation Patient Details Name: Douglas [name] MRN: [id number] DOB: 02/05/1931 Today's Date: 02/04/2015   History of Present Illness  With a history of vomiting, diabetes, hypertension, history of prostate cancer status post radiation, hyperlipidemia, recent fracture of the right femur status post intramedullary nailing. Patient was discharged from the skilled nursing facility yesterday, but had a complicated stay at the nursing facility that involved urinary retention. Patient currently has a Foley catheter placed. The patient's began having fevers and shaking chills and began to vomit this morning. The patient was brought to the hospital for evaluation. There is some concerns of dementia the patient is not reliable for history.   Clinical Impression  Douglas [name], was seen in bed initially sleepy but easily awaken by verbal and tactile stimulus from wife/PT. Now, patient is awake, alert, slightly disoriented, able to follow directions well and has good safety awareness. Patient current functional status: overall bed mobility max assist, transfers Total Assist, gait not assess at this time due to generalized weakness. Current problem list: generalized weakness, RLE ROM/Strength, 4.5 to 5 inches partially intact Pressure sore on Left lateral heel, standing balance and increase level of assist from caregiver. D/C plan of patient/family is to return home.    Follow Up Recommendations Home health PT    Equipment Recommendations    none   Recommendations for Other Services OT consult     Precautions / Restrictions Precautions Precautions: Fall Required Braces or Orthoses: Knee Immobilizer - Right Knee Immobilizer - Right: On at all times (Per wife report, She stated that Knee immobilizer can be removed  once in bed just to air  the RLE per doctor's verbal instruction to patient/family) Restrictions Weight Bearing Restrictions: Yes RLE Weight Bearing: Touchdown weight  bearing      Mobility  Bed Mobility Overal bed mobility: Needs Assistance Bed Mobility: Supine to Sit     Supine to sit: Max assist;HOB elevated     General bed mobility comments: Patient is pulling on PT's arm to bring himself in to upright position, Assist to bring legs over to EOB. Patient is able to do scooting to the side.  Transfers Overall transfer level: Needs assistance   Transfers: Stand Pivot Transfers   Stand pivot transfers: Max assist       General transfer comment: VERbal cues for hand placement. Assist to bring hips up. Left side buckling even with max assist. Kept my foot under pt's to make sure he was not bearing weight. Unable to do a stand pivot transfer due to 4.5 to 5 inches pressure sore on the left lateral heel. Patient unable to tolerate standing due to generalized weakness  Ambulation/Gait             General Gait Details: Not assessed at this time  Stairs            Wheelchair Mobility    Modified Rankin (Stroke Patients Only)       Balance Overall balance assessment: Needs assistance Sitting-balance support: Bilateral upper extremity supported;Feet supported Sitting balance-Leahy Scale: Good                                       Pertinent Vitals/Pain Pain Assessment: No/denies pain    Home Living Family/patient expects to be discharged to:: Private residence Living Arrangements: Spouse/significant other Available Help at Discharge: Home health;Available 24 hours/day Type of Home: House Home Access:  Stairs to enter;Ramped entrance (Ramp is currently installed hopefully ready when patient D/C)   Entrance Stairs-Number of Steps: 3   Home Equipment: Walker - 2 wheels;Walker - 4 wheels;Shower seat;Wheelchair - manual;Hospital bed      Prior Function Level of Independence: Independent               Hand Dominance        Extremity/Trunk Assessment   Upper Extremity Assessment: Defer to OT  evaluation           Lower Extremity Assessment: Generalized weakness;RLE deficits/detail         Communication   Communication: No difficulties  Cognition Arousal/Alertness: Awake/alert Behavior During Therapy: WFL for tasks assessed/performed Overall Cognitive Status: Within Functional Limits for tasks assessed                      General Comments General comments (skin integrity, edema, etc.): (+) partial skin break on the Left Lateral Heel measured 4.5 to 5 inches pressure sore per wound nurse report and measurement    Exercises        Assessment/Plan    PT Assessment Patient needs continued PT services  PT Diagnosis Difficulty walking;Generalized weakness   PT Problem List Decreased strength;Decreased range of motion;Decreased activity tolerance;Decreased balance;Decreased mobility;Decreased skin integrity  PT Treatment Interventions DME instruction;Gait training;Functional mobility training;Therapeutic activities;Therapeutic exercise;Patient/family education;Wheelchair mobility training;Balance training   PT Goals (Current goals can be found in the Care Plan section) Acute Rehab PT Goals Patient Stated Goal: Return home PT Goal Formulation: With patient/family Time For Goal Achievement: 02/11/15 Potential to Achieve Goals: Good    Frequency Min 5X/week   Barriers to discharge   none    Co-evaluation               End of Session Equipment Utilized During Treatment: Gait belt;Right knee immobilizer Activity Tolerance: Patient limited by fatigue Patient left: in chair;with call bell/phone within reach;with chair alarm set;with family/visitor present Nurse Communication: Mobility status         Time: 1735-6701 PT Time Calculation (min) (ACUTE ONLY): 55 min   Charges:   PT Evaluation $Initial PT Evaluation Tier I: 1 Procedure     PT G CodesDemetrios Isaacs L 02/04/2015, 9:59 AM

## 2015-02-04 NOTE — Care Management Note (Unsigned)
    Page 1 of 2   02/08/2015     4:54:16 PM CARE MANAGEMENT NOTE 02/08/2015  Patient:  Douglas [name], Douglas [name]   Account Number:  [id number]  Date Initiated:  02/04/2015  Documentation initiated by:  Theophilus Kinds  Subjective/Objective Assessment:   Pt admitted from [facility] with pneumonia. Anticipate discharge back to facility.     Action/Plan:   CSW is aware and will arrange discharge when medically stable.   Anticipated DC Date:  02/09/2015   Anticipated DC Plan:  [place]  In-house referral  Clinical Social Worker      DC Planning Services  CM consult      [facility] Choice  Resumption Of Svcs/PTA Provider  DURABLE MEDICAL EQUIPMENT   Choice offered to / List presented to:  C-3 Spouse   DME arranged  HOSPITAL BED      DME agency  [place] arranged  HH-1 RN  HH-10 DISEASE MANAGEMENT  HH-2 PT  HH-4 NURSE'S AIDE  [place].   Status of service:  In process, will continue to follow Medicare Important Message given?  YES (If response is "NO", the following Medicare IM given date fields will be blank) Date Medicare IM given:  02/08/2015 Medicare IM given by:  Vladimir Creeks Date Additional Medicare IM given:   Additional Medicare IM given by:    Discharge Disposition:  [place]  Per UR Regulation:  Reviewed for med. necessity/level of care/duration of stay  If discussed at [place] of Stay Meetings, dates discussed:    Comments:  02/06/15 [place] Kaislyn Gulas RN/CM Per spouse, she borrowed a hosp bed from a friend and it had no side rails, so she is sending it back, so needs a hosp bed. Ordered bed 02/04/15 [place], RN BSN CM CSW notified CM that pt has just been discharged from [facility] center home prior to admission. PT has evaluated pt and is recommending [place] PT. WIll continue to follow for discharge planning needs.  02/04/15 [place], RN  BSN CM

## 2015-02-04 NOTE — Progress Notes (Signed)
Bladder scan performed per Dr Petra Kuba orders, greater than 200 ml's noted. Dr Karleen [place] notified. Will continue to monitor patient,family at the bedside.

## 2015-02-04 NOTE — Progress Notes (Signed)
[name] PROGRESS NOTE  Douglas [name] DVV:[id number] DOB: June 25, 1931 DOA: 02/02/2015 PCP: [name]., MD Interim summary: Douglas [name] is a 81 y.o. male  With a history of vomiting, diabetes, hypertension, history of prostate cancer status post radiation, hyperlipidemia, recent fracture of the right femur status post intramedullary nailing. Patient was discharged from the skilled nursing facility yesterday, comes in for nausea, vomiting and fever. CXR revealed questionable early pneumonia. He was admitted for health care associated pneumonia. He was also found to have urinary retention, so foley catheter was placed. Urology was consulted.  Assessment/Plan: 1. Fever chills and questionable  Health care associated pneumonia: Started on IV vancomycin and cefepime.  Nasal oxygen as needed.   2. Urinary retention: Resolved. Foley catheter placed. Urology consulted.   3. Diabetes mellitus: CBG (last 3)   Recent Labs  02/03/15 1148 02/03/15 1653 02/03/15 2057  GLUCAP 99 115* 154*    cbg's running borderline. Stop lantus and Get hgba1c.   4. Nausea, vomiting : Resolved. Unclear etiology.    Constipation: started on colace and miralax. Had 4 bowel movements last night.   Recent right femur fracture: physical therapy.   Abnormal UA: Urine cultures ordered and pending.   Hyperkalemia: One dose of kayexalate given last night, the repeat potassium levels are still elevated. One dose of calcium gluconate, insulin, glucose and another dose of kayexalate given. Repeat potassium in am.   Acute on CKD stage 4: Slight worsening when compared to baseline.  Patient's family/ wife wants renal to be consulted for recommendations.   Anemia: Normocytic, get anemia panel .  Transfuse to keep it greater than 7.  Repeat tonight.       DVT prophylaxis.    Code Status: full code.  Family Communication: family at bedside Disposition Plan: pending further testing.     Consultants: Physical therapy Procedures:  none  Antibiotics:  VANCOMYCIN  cefepime  HPI/Subjective: He denies any new complaints but he is confused at baseline because of dementia.    Objective: Filed Vitals:   02/04/15 0635  BP: 138/51  Pulse: 85  Temp: 98.2 F (36.8 C)  Resp: 20    Intake/Output Summary (Last 24 hours) at 02/04/15 1714 Last data filed at 02/04/15 0900  Gross per 24 hour  Intake    240 ml  Output   1100 ml  Net   -860 ml   Filed Weights   02/02/15 1400 02/02/15 1736  Weight: 65.318 kg (144 lb) 62.46 kg (137 lb 11.2 oz)    Exam:   General:  Alert afebrile comfortable.   Cardiovascular: s1s2  Respiratory: clear to ausculation, no wheezing or rhonchi,   Abdomen: soft , non tender non distended bowel sounds heard  Musculoskeletal: left heel pressure ulcer. No pedal edema.   Data Reviewed: Basic Metabolic Panel:  Recent Labs Lab 01/30/15 0800 02/01/15 0901 02/02/15 1458 02/02/15 1930 02/03/15 0619 02/04/15 0706  NA 140 136 134*  --  135 137  K 5.1 5.0 6.1* 5.2* 5.3* 5.5*  CL 112 112 106  --  111 109  CO2 20 19 21   --  22 19  GLUCOSE 214* 177* 190*  --  69* 165*  BUN 63* 56* 57*  --  62* 63*  CREATININE 2.99* 2.84* 2.98*  --  3.32* 3.10*  CALCIUM 8.4 8.5 8.7  --  8.2* 8.0*   Liver Function Tests:  Recent Labs Lab 02/02/15 1458  AST 16  ALT 9  ALKPHOS 103  BILITOT 0.5  PROT 7.8  ALBUMIN 3.1*    Recent Labs Lab 02/02/15 1458  LIPASE 28   No results for input(s): AMMONIA in the last 168 hours. CBC:  Recent Labs Lab 01/30/15 0800 02/01/15 0901 02/02/15 1458 02/03/15 0619  WBC 8.4 10.6* 15.2* 11.1*  NEUTROABS 5.8 7.7 13.3*  --   HGB 8.7* 9.0* 8.9* 7.7*  HCT 27.3* 27.8* 26.9* 24.3*  MCV 89.2 88.5 88.2 89.3  PLT 251 309 252 240   Cardiac Enzymes:  Recent Labs Lab 02/02/15 1458  TROPONINI <0.03   BNP (last 3 results) No results for input(s): BNP in the last 8760 hours.  ProBNP (last 3  results) No results for input(s): PROBNP in the last 8760 hours.  CBG:  Recent Labs Lab 02/03/15 0805 02/03/15 0837 02/03/15 1148 02/03/15 1653 02/03/15 2057  GLUCAP 69* 86 99 115* 154*    Recent Results (from the past 240 hour(s))  Urine culture     Status: None (Preliminary result)   Collection Time: 02/02/15  2:43 PM  Result Value Ref Range Status   Specimen Description URINE, CATHETERIZED  Final   Special Requests NONE  Final   Colony Count PENDING  Incomplete   Culture   Final    Culture reincubated for better growth Performed at [facility]    Report Status PENDING  Incomplete  MRSA PCR Screening     Status: Abnormal   Collection Time: 02/02/15  7:47 PM  Result Value Ref Range Status   MRSA by PCR POSITIVE (A) NEGATIVE Final    Comment: CRITICAL RESULT CALLED TO, READ BACK BY AND VERIFIED WITH: ROGERS,A AT 10:10PM ON 02/02/15 BY FESTERMAN,C        The GeneXpert MRSA Assay (FDA approved for NASAL specimens only), is one component of a comprehensive MRSA colonization surveillance program. It is not intended to diagnose MRSA infection nor to guide or monitor treatment for MRSA infections.      Studies: No results found.  Scheduled Meds: . amLODipine  5 mg Oral Daily  . antiseptic oral rinse  7 mL Mouth Rinse BID  . atorvastatin  40 mg Oral Daily  . ceFEPime (MAXIPIME) IV  1 g Intravenous Q24H  . Chlorhexidine Gluconate Cloth  6 each Topical Q0600  . clotrimazole   Topical BID  . dextrose      . enoxaparin (LOVENOX) injection  30 mg Subcutaneous Q24H  . feeding supplement (ENSURE COMPLETE)  237 mL Oral BID BM  . gabapentin  300 mg Oral QHS  . insulin aspart  0-9 Units Subcutaneous TID WC  . ketorolac  1 drop Both Eyes TID  . mupirocin ointment  1 application Nasal BID  . senna-docusate  2 tablet Oral QHS  . vancomycin  1,000 mg Intravenous Q48H   Continuous Infusions: . sodium chloride 100 mL/hr at 02/03/15 1549    Active Problems:    Hyperkalemia   Fever   HCAP (healthcare-associated pneumonia)   Urinary tract infectious disease   Yeast dermatitis of penis    Time spent: 25 min    [place] [facility] Pager [phone], please contact night-coverage at [email or web address], password [facility] 02/04/2015, 5:14 PM  LOS: 2 days

## 2015-02-04 NOTE — Clinical Documentation Improvement (Signed)
MD's, NP's, and PA's  Per chart patient with "indwelling foley" for urinary retention, WBC 15.2   Temp 102.1, Pulse ( 101-107) on admit.  Would either of the following conditions be appropriate for diagnosis for this admission an/or POA.?  If so please document in notes and discharge summary. Thank you    Possible Clinical Conditions?  SIRS  Septicemia / Sepsis  Sepsis due to UTI from an indwelling foley  Other Condition   Cannot clinically Determine      Risk Factors: indwelling catheter, urinary retention, h/o renal calculus, s/p femur fx w IM nailing repair surgery  Diagnostics: Urine  cultures pending  Treatment:  IV Vancomycin and cefepime  Thank You, Ree Kida ,RN  C. Clinical Documentation Specialist:  [phone]  [facility] [place] [employer] [facility]

## 2015-02-04 NOTE — Progress Notes (Signed)
INITIAL NUTRITION ASSESSMENT   INTERVENTION: Ensure Complete po BID, each supplement provides 350 kcal and 13 grams of protein   Low potassium foods  NUTRITION DIAGNOSIS: Inadequate oral intake  related to decreased appetite per spouse as evidenced by meal intake 0-50% and weight loss (3-4% below usual body weight) in past 3 weeks  Goal: Pt to meet >/= 90% of their estimated nutrition needs    Monitor:  Po intake, labs and wt trends   Reason for Assessment: Malnutrition Screen   81 y.o. male  ASSESSMENT: Pt has hx of diabetes type E, prostate cancer, HLD, HTN and recent femur fx s/p surgical intramedullary nailing. Recent upper GI bleed (January). He presents with health care associated pneumonia. His appetite has been diminished the last 2-3 weeks.  Nutrition focused exam: within normal limits Labs: BUN-63 and Creat -3.10 high-  Height: Ht Readings from Last 1 Encounters:  02/02/15 5\' 3"  (1.6 m)    Weight: Wt Readings from Last 1 Encounters:  02/02/15 137 lb 11.2 oz (62.46 kg)    Ideal Body Weight: 124# (56 kg)  % Ideal Body Weight: 111%  Wt Readings from Last 10 Encounters:  02/02/15 137 lb 11.2 oz (62.46 kg)  01/17/15 144 lb (65.318 kg)  11/27/14 154 lb 5.1 oz (69.999 kg)  11/17/14 154 lb 5.2 oz (70 kg)  11/15/14 140 lb (63.504 kg)  10/15/14 140 lb (63.504 kg)  10/13/14 140 lb (63.504 kg)  04/19/14 145 lb (65.772 kg)  05/05/13 158 lb (71.668 kg)    Usual Body Weight: 140-144#  % Usual Body Weight: 97%  BMI:  Body mass index is 24.4 kg/(m^2).normal range  Estimated Nutritional Needs: Kcal: 1700-1900 Protein: 75 gr Fluid: 1.7-1.9 liters daily  Skin: stage II to buttocks, stage I to left heel   Diet Order: Diet Carb Modified (low potassium foods)  EDUCATION NEEDS: -No education needs identified at this time   Intake/Output Summary (Last 24 hours) at 02/04/15 1656 Last data filed at 02/04/15 0900  Gross per 24 hour  Intake    240 ml  Output    1100 ml  Net   -860 ml    Last BM: 02/03/15  Labs:   Recent Labs Lab 02/02/15 1458 02/02/15 1930 02/03/15 0619 02/04/15 0706  NA 134*  --  135 137  K 6.1* 5.2* 5.3* 5.5*  CL 106  --  111 109  CO2 21  --  22 19  BUN 57*  --  62* 63*  CREATININE 2.98*  --  3.32* 3.10*  CALCIUM 8.7  --  8.2* 8.0*  GLUCOSE 190*  --  69* 165*    CBG (last 3)   Recent Labs  02/03/15 1148 02/03/15 1653 02/03/15 2057  GLUCAP 99 115* 154*    Scheduled Meds: . amLODipine  5 mg Oral Daily  . antiseptic oral rinse  7 mL Mouth Rinse BID  . atorvastatin  40 mg Oral Daily  . ceFEPime (MAXIPIME) IV  1 g Intravenous Q24H  . Chlorhexidine Gluconate Cloth  6 each Topical Q0600  . clotrimazole   Topical BID  . dextrose      . enoxaparin (LOVENOX) injection  30 mg Subcutaneous Q24H  . feeding supplement (ENSURE COMPLETE)  237 mL Oral BID BM  . gabapentin  300 mg Oral QHS  . insulin aspart  0-9 Units Subcutaneous TID WC  . ketorolac  1 drop Both Eyes TID  . mupirocin ointment  1 application Nasal BID  . senna-docusate  2 tablet Oral QHS  . vancomycin  1,000 mg Intravenous Q48H    Continuous Infusions: . sodium chloride 100 mL/hr at 02/03/15 1549    Past Medical History  Diagnosis Date  . Hypertension   . Diabetes mellitus   . Hyperlipidemia   . Cataracts, bilateral   . Hyperkalemia   . UTI (lower urinary tract infection)   . Calculus of kidney   . Sensorineural hearing loss, unspecified   . Epiglottitis   . Chronic ankle pain   . Neuromuscular disorder     neurophaty lower legs  . Upper GI bleeding     while at [facility] 12/2014  . Prostate cancer 2009    radiation  . Confusion     Past Surgical History  Procedure Laterality Date  . Eye surgery  sept. 2015    cataract surgery  . Hernia repair      severa; years can't remeber  . Femur im nail Right 01/18/2015    Procedure: INTRAMEDULLARY (IM) RETROGRADE FEMORAL NAILING;  Surgeon: Renette Butters, MD;  Location: [place];  Service:  Orthopedics;  Laterality: Right;    Colman Cater MS,RD,CSG,LDN Office: [phone] Pager: [phone]

## 2015-02-05 ENCOUNTER — Inpatient Hospital Stay (HOSPITAL_COMMUNITY): Payer: Medicare Other

## 2015-02-05 DIAGNOSIS — R309 Painful micturition, unspecified: Secondary | ICD-10-CM

## 2015-02-05 DIAGNOSIS — R3915 Urgency of urination: Secondary | ICD-10-CM

## 2015-02-05 DIAGNOSIS — R339 Retention of urine, unspecified: Secondary | ICD-10-CM

## 2015-02-05 LAB — GLUCOSE, CAPILLARY
GLUCOSE-CAPILLARY: 140 mg/dL — AB (ref 70–99)
GLUCOSE-CAPILLARY: 199 mg/dL — AB (ref 70–99)
GLUCOSE-CAPILLARY: 79 mg/dL (ref 70–99)
GLUCOSE-CAPILLARY: 161 mg/dL — AB (ref 70–99)
GLUCOSE-CAPILLARY: 149 mg/dL — AB (ref 70–99)
GLUCOSE-CAPILLARY: 136 mg/dL — AB (ref 70–99)
Glucose-Capillary: 178 mg/dL — ABNORMAL HIGH (ref 70–99)

## 2015-02-05 LAB — BASIC METABOLIC PANEL
Anion gap: 5 (ref 5–15)
BUN: 61 mg/dL — AB (ref 6–23)
CO2: 21 mmol/L (ref 19–32)
CREATININE: 2.93 mg/dL — AB (ref 0.50–1.35)
Calcium: 8.1 mg/dL — ABNORMAL LOW (ref 8.4–10.5)
Chloride: 112 mmol/L (ref 96–112)
GFR, EST AFRICAN AMERICAN: 22 mL/min — AB (ref >90)
GFR, EST NON AFRICAN AMERICAN: 19 mL/min — AB (ref >90)
Glucose, Bld: 135 mg/dL — ABNORMAL HIGH (ref 70–99)
POTASSIUM: 5.2 mmol/L — AB (ref 3.5–5.1)
Sodium: 138 mmol/L (ref 135–145)

## 2015-02-05 LAB — RETICULOCYTES
RBC.: 2.57 MIL/uL — ABNORMAL LOW (ref 4.22–5.81)
Retic Count, Absolute: 38.6 10*3/uL (ref 19.0–186.0)
Retic Ct Pct: 1.5 % (ref 0.4–3.1)

## 2015-02-05 LAB — CBC
HCT: 23 % — ABNORMAL LOW (ref 39.0–52.0)
HEMOGLOBIN: 7.4 g/dL — AB (ref 13.0–17.0)
MCH: 29 pg (ref 26.0–34.0)
MCHC: 32.2 g/dL (ref 30.0–36.0)
MCV: 90.2 fL (ref 78.0–100.0)
PLATELETS: 247 10*3/uL (ref 150–400)
RBC: 2.55 MIL/uL — AB (ref 4.22–5.81)
RDW: 13.9 % (ref 11.5–15.5)
WBC: 11 10*3/uL — AB (ref 4.0–10.5)

## 2015-02-05 LAB — HEMOGLOBIN A1C
HEMOGLOBIN A1C: 8.2 % — AB (ref 4.8–5.6)
MEAN PLASMA GLUCOSE: 189 mg/dL

## 2015-02-05 LAB — PREPARE RBC (CROSSMATCH)

## 2015-02-05 LAB — ABO/RH: ABO/RH(D): A POS

## 2015-02-05 MED ORDER — OXYBUTYNIN CHLORIDE 5 MG PO TABS
5.0000 mg | ORAL_TABLET | Freq: Three times a day (TID) | ORAL | Status: DC | PRN
  Administered 2015-02-05: 5 mg via ORAL
  Filled 2015-02-05: qty 1

## 2015-02-05 MED ORDER — SODIUM CHLORIDE 0.9 % IV SOLN: Freq: Once | INTRAVENOUS | Status: DC

## 2015-02-05 MED ORDER — INSULIN DETEMIR 100 UNIT/ML ~~LOC~~ SOLN
12.0000 [IU] | Freq: Every day | SUBCUTANEOUS | Status: DC
  Administered 2015-02-05 – 2015-02-08 (×3): 12 [IU] via SUBCUTANEOUS
  Filled 2015-02-05 (×6): qty 0.12

## 2015-02-05 MED ORDER — TAMSULOSIN HCL 0.4 MG PO CAPS
0.4000 mg | ORAL_CAPSULE | Freq: Every day | ORAL | Status: DC
  Administered 2015-02-05 – 2015-02-09 (×5): 0.4 mg via ORAL
  Filled 2015-02-05 (×5): qty 1

## 2015-02-05 NOTE — Progress Notes (Signed)
[name] PROGRESS NOTE  LINDA BIEHN [id number] DOB: [birth year] DOA: 02/02/2015 PCP: [name]., MD Interim summary: Douglas [name] is a 81 y.o. male  With a history of vomiting, diabetes, hypertension, history of prostate cancer status post radiation, hyperlipidemia, recent fracture of the right femur status post intramedullary nailing. Patient was discharged from the skilled nursing facility yesterday, comes in for nausea, vomiting and fever. CXR revealed questionable early pneumonia. He was admitted for health care associated pneumonia. He was also found to have urinary retention, so foley catheter was placed. Urology was consulted.  Assessment/Plan: 1. Fever chills and questionable  Health care associated pneumonia: Started on IV vancomycin and cefepime.  Nasal oxygen as needed. Repeat CXR today and plan to transition to oral antibiotics as he remains afebrile and his leukocytosis is improving.   2. Urinary retention: Resolved. Foley catheter placed. Urology consulted.   3. Diabetes mellitus: CBG (last 3)   Recent Labs  02/03/15 2057 02/04/15 1344 02/05/15 1147  GLUCAP 154* 199* 161*    cbg's running borderline.  I stopped the lantus and patient's wife and family reports poor appetite and borderline cbg's. His appetite is better and he is eating atleast 40% of his meals. Plan to resume his lantus at a lower dose from today . His hgba1c is 8.2.   4. Nausea, vomiting : Resolved. Unclear etiology.    Constipation: started on colace and miralax. Resolved.   Recent right femur fracture: physical therapy.   Abnormal UA: Urine cultures ordered and pending. Yeast and enterococcus.   Hyperkalemia: One dose of kayexalate given last night, the repeat potassium levels are still elevated. One dose of calcium gluconate, insulin, glucose and another dose of kayexalate given on 2/8. rpeat potassium is 5.2  Acute on CKD stage 4: improving with fluids.  Slight  worsening when compared to baseline on 2/8.  Patient's family/ wife wants renal to be consulted for recommendations.   Anemia: Normocytic, get anemia panel .  Transfuse to keep it greater than 7.  Repeat in am.      DVT prophylaxis.    Code Status: full code.  Family Communication: family at bedside Disposition Plan: pending further testing.    Consultants: Physical therapy Procedures:  none  Antibiotics:  VANCOMYCIN  cefepime  HPI/Subjective: He denies any new complaints but he is confused at baseline because of dementia.    Objective: Filed Vitals:   02/05/15 1433  BP: 134/49  Pulse: 84  Temp: 99 F (37.2 C)  Resp:     Intake/Output Summary (Last 24 hours) at 02/05/15 1559 Last data filed at 02/05/15 1355  Gross per 24 hour  Intake   1780 ml  Output   2000 ml  Net   -220 ml   Filed Weights   02/02/15 1400 02/02/15 1736  Weight: 65.318 kg (144 lb) 62.46 kg (137 lb 11.2 oz)    Exam:   General:  Alert afebrile comfortable.   Cardiovascular: s1s2  Respiratory: clear to ausculation, no wheezing or rhonchi,   Abdomen: soft , non tender non distended bowel sounds heard  Musculoskeletal: left heel pressure ulcer. No pedal edema.   Data Reviewed: Basic Metabolic Panel:  Recent Labs Lab 02/02/15 1458 02/02/15 1930 02/03/15 0619 02/04/15 0706 02/04/15 1945 02/05/15 0632  NA 134*  --  135 137 137 138  K 6.1* 5.2* 5.3* 5.5* 5.5* 5.2*  CL 106  --  111 109 111 112  CO2 21  --  22 19 22  21  GLUCOSE 190*  --  69* 165* 164* 135*  BUN 57*  --  62* 63* 60* 61*  CREATININE 2.98*  --  3.32* 3.10* 2.95* 2.93*  CALCIUM 8.7  --  8.2* 8.0* 8.0* 8.1*   Liver Function Tests:  Recent Labs Lab 02/02/15 1458  AST 16  ALT 9  ALKPHOS 103  BILITOT 0.5  PROT 7.8  ALBUMIN 3.1*    Recent Labs Lab 02/02/15 1458  LIPASE 28   No results for input(s): AMMONIA in the last 168 hours. CBC:  Recent Labs Lab 01/30/15 0800 02/01/15 0901  02/02/15 1458 02/03/15 0619 02/04/15 1945 02/05/15 0632  WBC 8.4 10.6* 15.2* 11.1*  --  11.0*  NEUTROABS 5.8 7.7 13.3*  --   --   --   HGB 8.7* 9.0* 8.9* 7.7* 7.6* 7.4*  HCT 27.3* 27.8* 26.9* 24.3* 23.5* 23.0*  MCV 89.2 88.5 88.2 89.3  --  90.2  PLT 251 309 252 240  --  247   Cardiac Enzymes:  Recent Labs Lab 02/02/15 1458  TROPONINI <0.03   BNP (last 3 results) No results for input(s): BNP in the last 8760 hours.  ProBNP (last 3 results) No results for input(s): PROBNP in the last 8760 hours.  CBG:  Recent Labs Lab 02/03/15 1148 02/03/15 1653 02/03/15 2057 02/04/15 1344 02/05/15 1147  GLUCAP 99 115* 154* 199* 161*    Recent Results (from the past 240 hour(s))  Urine culture     Status: None (Preliminary result)   Collection Time: 02/02/15  2:43 PM  Result Value Ref Range Status   Specimen Description URINE, CATHETERIZED  Final   Special Requests NONE  Final   Colony Count   Final    >=100,000 COLONIES/ML Performed at [facility]    Culture   Final    ENTEROCOCCUS SPECIES YEAST Performed at [facility]    Report Status PENDING  Incomplete  MRSA PCR Screening     Status: Abnormal   Collection Time: 02/02/15  7:47 PM  Result Value Ref Range Status   MRSA by PCR POSITIVE (A) NEGATIVE Final    Comment: CRITICAL RESULT CALLED TO, READ BACK BY AND VERIFIED WITH: ROGERS,A AT 10:10PM ON 02/02/15 BY FESTERMAN,C        The GeneXpert MRSA Assay (FDA approved for NASAL specimens only), is one component of a comprehensive MRSA colonization surveillance program. It is not intended to diagnose MRSA infection nor to guide or monitor treatment for MRSA infections.      Studies: No results found.  Scheduled Meds: . sodium chloride   Intravenous Once  . amLODipine  5 mg Oral Daily  . antiseptic oral rinse  7 mL Mouth Rinse BID  . atorvastatin  40 mg Oral Daily  . ceFEPime (MAXIPIME) IV  1 g Intravenous Q24H  . Chlorhexidine Gluconate  Cloth  6 each Topical Q0600  . clotrimazole   Topical BID  . enoxaparin (LOVENOX) injection  30 mg Subcutaneous Q24H  . feeding supplement (ENSURE COMPLETE)  237 mL Oral BID BM  . gabapentin  300 mg Oral QHS  . insulin aspart  0-9 Units Subcutaneous TID WC  . ketorolac  1 drop Both Eyes TID  . mupirocin ointment  1 application Nasal BID  . senna-docusate  2 tablet Oral QHS  . tamsulosin  0.4 mg Oral Daily  . vancomycin  1,000 mg Intravenous Q48H   Continuous Infusions: . sodium chloride 100 mL/hr at 02/03/15 1549    Active Problems:  Hyperkalemia   Fever   HCAP (healthcare-associated pneumonia)   Urinary tract infectious disease   Yeast dermatitis of penis    Time spent: 25 min    [place] [facility] Pager [phone], please contact night-coverage at [email or web address], password [facility] 02/05/2015, 3:59 PM  LOS: 3 days

## 2015-02-05 NOTE — Progress Notes (Signed)
ANTIBIOTIC CONSULT NOTE  Pharmacy Consult for Vancomycin Indication: pneumonia  No Known Allergies  Patient Measurements: Height: 5\' 3"  (160 cm) Weight: 137 lb 11.2 oz (62.46 kg) IBW/kg (Calculated) : 56.9  Vital Signs: Temp: 98.5 F (36.9 C) (02/09 0500) BP: 112/54 mmHg (02/09 0500) Pulse Rate: 78 (02/09 0500) Intake/Output from previous day: 02/08 0701 - 02/09 0700 In: 1540 [P.O.:240; I.V.:1100; IV Piggyback:200] Out: 1200 [Urine:1200] Intake/Output from this shift: Total I/O In: 240 [P.O.:240] Out: 800 [Urine:800]  Labs:  Recent Labs  02/02/15 1458 02/03/15 0619 02/04/15 0706 02/04/15 1945 02/05/15 0632  WBC 15.2* 11.1*  --   --  11.0*  HGB 8.9* 7.7*  --  7.6* 7.4*  PLT 252 240  --   --  247  CREATININE 2.98* 3.32* 3.10* 2.95* 2.93*   Estimated Creatinine Clearance: 15.9 mL/min (by C-G formula based on Cr of 2.93). No results for input(s): VANCOTROUGH, VANCOPEAK, VANCORANDOM, GENTTROUGH, GENTPEAK, GENTRANDOM, TOBRATROUGH, TOBRAPEAK, TOBRARND, AMIKACINPEAK, AMIKACINTROU, AMIKACIN in the last 72 hours.   Microbiology: Recent Results (from the past 720 hour(s))  Surgical pcr screen     Status: None   Collection Time: 01/18/15  5:32 AM  Result Value Ref Range Status   MRSA, PCR NEGATIVE NEGATIVE Final   Staphylococcus aureus NEGATIVE NEGATIVE Final    Comment:        The Xpert SA Assay (FDA approved for NASAL specimens in patients over 79 years of age), is one component of a comprehensive surveillance program.  Test performance has been validated by [facility] for patients greater than or equal to 79 year old. It is not intended to diagnose infection nor to guide or monitor treatment.   Urine culture     Status: None (Preliminary result)   Collection Time: 02/02/15  2:43 PM  Result Value Ref Range Status   Specimen Description URINE, CATHETERIZED  Final   Special Requests NONE  Final   Colony Count   Final    >=100,000 COLONIES/ML Performed at  [facility]    Culture   Final    ENTEROCOCCUS SPECIES YEAST Performed at [facility]    Report Status PENDING  Incomplete  MRSA PCR Screening     Status: Abnormal   Collection Time: 02/02/15  7:47 PM  Result Value Ref Range Status   MRSA by PCR POSITIVE (A) NEGATIVE Final    Comment: CRITICAL RESULT CALLED TO, READ BACK BY AND VERIFIED WITH: ROGERS,A AT 10:10PM ON 02/02/15 BY FESTERMAN,C        The GeneXpert MRSA Assay (FDA approved for NASAL specimens only), is one component of a comprehensive MRSA colonization surveillance program. It is not intended to diagnose MRSA infection nor to guide or monitor treatment for MRSA infections.     Anti-infectives    Start     Dose/Rate Route Frequency Ordered Stop   02/04/15 1600  vancomycin (VANCOCIN) IVPB 1000 mg/200 mL premix     1,000 mg200 mL/hr over 60 Minutes Intravenous Every 48 hours 02/02/15 2117     02/03/15 1600  ceFEPIme (MAXIPIME) 1 g in dextrose 5 % 50 mL IVPB     1 g100 mL/hr over 30 Minutes Intravenous Every 24 hours 02/02/15 2117     02/03/15 0000  ceFEPIme (MAXIPIME) 2 g in dextrose 5 % 50 mL IVPB  Status:  Discontinued     2 g100 mL/hr over 30 Minutes Intravenous Every 8 hours 02/02/15 1911 02/02/15 2117   02/02/15 1630  vancomycin (VANCOCIN) IVPB 1000  mg/200 mL premix     1,000 mg200 mL/hr over 60 Minutes Intravenous  Once 02/02/15 1623 02/02/15 1738   02/02/15 1600  ceFEPIme (MAXIPIME) 1 g in dextrose 5 % 50 mL IVPB     1 g100 mL/hr over 30 Minutes Intravenous  Once 02/02/15 1550 02/02/15 1639     Assessment: 81 yoM who presented with fevers and vomiting.  He was discharged from rehab facility to home recently.  He was noted to have UTI and CXR + developing PNA.  He was febrile on admission (Tmax 102.89F) with elevated WBC.  Lactic acid is normal.  Chronic renal insufficiency noted.  Estimated CrCl ~ 15-79ml/min.  Vancomycin 2/6>> Cefepime 2/6>>   Goal of Therapy:  Vancomycin trough level  15-20 mcg/ml  Plan:  Vancomycin 1gm IV q48h Check Vancomycin trough at steady state Monitor renal function and cx data  Cefepime 1gm IV q24h  [place] Crane, Raylan Troiani A 02/05/2015,1:34 PM

## 2015-02-05 NOTE — Progress Notes (Signed)
Clinical/Bedside Swallow Evaluation Patient Details  Name: Douglas [name] MRN: [id number] Date of Birth: 1931/07/23  Today's Date: 02/05/2015 Time: SLP Start Time (ACUTE ONLY): 26 SLP Stop Time (ACUTE ONLY): 1128 SLP Time Calculation (min) (ACUTE ONLY): 26 min  Past Medical History:  Past Medical History  Diagnosis Date  . Hypertension   . Diabetes mellitus   . Hyperlipidemia   . Cataracts, bilateral   . Hyperkalemia   . UTI (lower urinary tract infection)   . Calculus of kidney   . Sensorineural hearing loss, unspecified   . Epiglottitis   . Chronic ankle pain   . Neuromuscular disorder     neurophaty lower legs  . Upper GI bleeding     while at [facility] 12/2014  . Prostate cancer 2009    radiation  . Confusion    Past Surgical History:  Past Surgical History  Procedure Laterality Date  . Eye surgery  sept. 2015    cataract surgery  . Hernia repair      severa; years can't remeber  . Femur im nail Right 01/18/2015    Procedure: INTRAMEDULLARY (IM) RETROGRADE FEMORAL NAILING;  Surgeon: Renette Butters, MD;  Location: [place];  Service: Orthopedics;  Laterality: Right;   HPI:  Douglas [name] is an 81 yo gentleman who was recently discharged from [facility] following rehab for femur fracture with repair (due to fall at home). He presented to [facility] with a history of vomiting, diabetes, hypertension, history of prostate cancer status post radiation, hyperlipidemia, recent fracture of the right femur status post intramedullary nailing. Patient was discharged from the skilled nursing facility yesterday, but had a complicated stay at the nursing facility that involved urinary retention. Patient currently has a Foley catheter placed. The patient's began having fevers and shaking chills and began to vomit on morning of admission. The patient was brought to the hospital for evaluation. Chest x-ray showed: Low lung volumes with bibasilar atelectasis. Difficult to exclude developing pneumonia in the  left lower lobe. SLP asked to evaluate swallow due to possible PNA. Wife and daughter present for evaluation.    Assessment / Plan / Recommendation Clinical Impression  Douglas [name] was seen at bedside from clinical swallow evaluation with his daughter and grandaughter present. All deny difficulty swallowing (in pt), but stated that pt has vomited a few times after eating in the past few days (most recently last night per daughter). It is difficult to know whether this is true emesis or regurgitation. Daughter states that it may be more regurgitation as it was not forceful and pt seemed to feel ok otherwise. Oral motor examination is WFL except for sparse dentition. Pt has partials, but does not want to wear. He tolerated regular textures and thin liquids well when allowed liquid wash when consuming crackers. No overt signs or symptoms of aspiration observed. Pt does have mild hoarse vocal quality which may be due to recent regurgitation/emesis and/or reflux. Pt also has a history of constipation with recent impaction which may also impact swallow function. Family and pt were encouraged to implement reflux precautions (sit upright for all eating/drinking, remain upright for 30-60 minutes after, and eat frequent, smaller meals). Continue diet as ordered, regular and thin. SLP will follow for diet tolerance as needed.    Aspiration Risk  Mild    Diet Recommendation Regular;Thin liquid   Liquid Administration via: Cup;Straw Medication Administration: Whole meds with liquid Supervision: Patient able to self feed;Intermittent supervision to cue for compensatory strategies Postural Changes  and/or Swallow Maneuvers: Seated upright 90 degrees;Upright 30-60 min after meal    Other  Recommendations Oral Care Recommendations: Oral care BID Other Recommendations: Clarify dietary restrictions   Follow Up Recommendations  None    Frequency and Duration min 1 x/week  1 week   Pertinent Vitals/Pain VSS    SLP Swallow Goals   Pt will demonstrate safe and efficient consumption of least restrictive diet with use of strategies as needed.    Swallow Study Prior Functional Status   Recently discharged from [facility] following rehab due to fall. Lives at home with wife.    General Date of Onset: 02/02/15 HPI: Douglas [name] is an 81 yo gentleman who was recently discharged from [facility] following rehab for femur fracture with repair (due to fall at home). He presented to [facility] with a history of vomiting, diabetes, hypertension, history of prostate cancer status post radiation, hyperlipidemia, recent fracture of the right femur status post intramedullary nailing. Patient was discharged from the skilled nursing facility yesterday, but had a complicated stay at the nursing facility that involved urinary retention. Patient currently has a Foley catheter placed. The patient's began having fevers and shaking chills and began to vomit on morning of admission. The patient was brought to the hospital for evaluation. Chest x-ray showed: Low lung volumes with bibasilar atelectasis. Difficult to exclude developing pneumonia in the left lower lobe. SLP asked to evaluate swallow due to possible PNA. Wife and daughter present for evaluation.  Type of Study: Bedside swallow evaluation Previous Swallow Assessment: None on record Diet Prior to this Study: Regular;Thin liquids Temperature Spikes Noted: No Respiratory Status: Room air History of Recent Intubation: No Behavior/Cognition: Alert;Cooperative;Pleasant mood Oral Cavity - Dentition: Poor condition (pt has partials but did not want to wear them) Self-Feeding Abilities: Able to feed self Patient Positioning: Upright in bed Baseline Vocal Quality: Hoarse (mild hoarseness to vocal quality) Volitional Cough: Strong Volitional Swallow: Able to elicit    Oral/Motor/Sensory Function Overall Oral Motor/Sensory Function: Appears within functional limits for tasks  assessed Labial ROM: Within Functional Limits Labial Symmetry: Within Functional Limits Labial Strength: Within Functional Limits Labial Sensation: Within Functional Limits Lingual ROM: Within Functional Limits Lingual Symmetry: Within Functional Limits Lingual Strength: Within Functional Limits Lingual Sensation: Within Functional Limits Facial ROM: Within Functional Limits Facial Symmetry: Within Functional Limits Facial Strength: Within Functional Limits Facial Sensation: Within Functional Limits Velum: Within Functional Limits Mandible: Within Functional Limits   Ice Chips Ice chips: Within functional limits Presentation: Spoon   Thin Liquid Thin Liquid: Within functional limits Presentation: Cup;Self Fed;Straw    Nectar Thick Nectar Thick Liquid: Not tested   Honey Thick Honey Thick Liquid: Not tested   Puree Puree: Within functional limits Presentation: Spoon   Solid   Thank you,  Genene Churn, CCC-SLP [phone]     Solid: Within functional limits (mild oral delays ) Presentation: Self Fed       PORTER,DABNEY 02/05/2015,3:08 PM

## 2015-02-05 NOTE — Progress Notes (Signed)
Wife called me into the room regarding blood on the sheets this evening around dinner time. After assessing, it appeared that the pt removed majority of his foley and had some bleeding from his penis. The balloon was still intact in the penis, and was deflated before the tip of the catheter was removed. The NT and I bathed the pt's legs and groin and changed his bed linen. Foley was inserted and the drainage into the bag appeared as a bloody urine. After about 30 mins the drainage did appear to lighten in color in the drainage tube. Mitts were placed on the pt to help prevent anymore pulling on the catheter. Pain medication was given as well. Family is often in the room and were encouraged to help him from pulling near the top of his legs or groin area. Nurse coming onto 2nd shift informed and will monitor the drainage as well.

## 2015-02-05 NOTE — Progress Notes (Signed)
Physical Therapy Treatment Patient Details Name: Douglas LUKAS MRN: [id number] DOB: 01/08/1931 Today's Date: 02/05/2015    History of Present Illness With a history of vomiting, diabetes, hypertension, history of prostate cancer status post radiation, hyperlipidemia, recent fracture of the right femur status post intramedullary nailing. Patient was discharged from the skilled nursing facility yesterday, but had a complicated stay at the nursing facility that involved urinary retention. Patient currently has a Foley catheter placed. The patient's began having fevers and shaking chills and began to vomit this morning. The patient was brought to the hospital for evaluation. There is some concerns of dementia the patient is not reliable for history.     PT Comments    Pt was receiving blood at the time of our arrival.  He was very lethargic but able to awaken for short moments of time.  He had no c/o of any kind.  He was able to tolerate only minimal therapeutic exercise to the LLE and right hip/ankle (in the immobilizer) due to drowsiness.  I did not feel it safe to try to transfer him OOB due to significant lethargy.  This was discussed with his wife and Therapist, sports.  Pt has not had any medication which should cause drowsiness per RN.  Wife plans to discuss lethargy with.  Follow Up Recommendations  Home health PT     Equipment Recommendations  Wheelchair (measurements PT);Wheelchair cushion (measurements PT)    Recommendations for Other Services       Precautions / Restrictions Precautions Precautions: Fall Required Braces or Orthoses: Knee Immobilizer - Right Knee Immobilizer - Right: On at all times Restrictions Weight Bearing Restrictions: Yes RLE Weight Bearing: Touchdown weight bearing    Mobility  Bed Mobility               General bed mobility comments: Pt is too lethargic to transfer out of bed  Transfers                    Ambulation/Gait                  Stairs            Wheelchair Mobility    Modified Rankin (Stroke Patients Only)       Balance                                    Cognition Arousal/Alertness: Lethargic Behavior During Therapy: WFL for tasks assessed/performed Overall Cognitive Status: Within Functional Limits for tasks assessed                      Exercises General Exercises - Lower Extremity Ankle Circles/Pumps: AAROM;Both;10 reps;Supine Short Arc Quad: AAROM;Left;10 reps;Supine Heel Slides: AAROM;10 reps;Supine;Left Hip ABduction/ADduction: AAROM;Both;10 reps;Supine Straight Leg Raises: AAROM;Both;10 reps;Supine    General Comments        Pertinent Vitals/Pain Pain Assessment: No/denies pain    Home Living                      Prior Function            PT Goals (current goals can now be found in the care plan section) Progress towards PT goals: Not progressing toward goals - comment (pt very lethargic)    Frequency  Min 3X/week    PT Plan Current plan remains appropriate;Frequency needs to be updated  Co-evaluation             End of Session Equipment Utilized During Treatment:  (Prevalon boot applied to LLE) Activity Tolerance: Patient limited by fatigue;Patient limited by lethargy Patient left: in bed;with call bell/phone within reach;with bed alarm set;with nursing/sitter in room     Time: 0017-4944 PT Time Calculation (min) (ACUTE ONLY): 22 min  Charges:  $Therapeutic Exercise: 8-22 mins                    G Codes:      Sable Feil Feb 21, 2015, 2:35 PM

## 2015-02-05 NOTE — Consult Note (Signed)
Urology Consult  Consulting MD: Karleen Hampshire  CC: Urinary retention2  HPI: This is an 81 year old male w/ a history of recurrent/metastatic PCa--followed by Dr Jeffie Pollock and on ADT. He was recently admitted for mgmt of CAP, and was noted to have retention. A catheter was placed, and he recently failed a voiding trial.  Residual urine volume in the office in July 2015 was approx. 30 ml.  He has longstanding urgency as well as urgency incontinence. He wears depends @ home. He denies recent dysuria. Blood has been in the urine sin=nce his catheter was placed. He is having bladder spasms.  PMH: Past Medical History  Diagnosis Date  . Hypertension   . Diabetes mellitus   . Hyperlipidemia   . Cataracts, bilateral   . Hyperkalemia   . UTI (lower urinary tract infection)   . Calculus of kidney   . Sensorineural hearing loss, unspecified   . Epiglottitis   . Chronic ankle pain   . Neuromuscular disorder     neurophaty lower legs  . Upper GI bleeding     while at [facility] 12/2014  . Prostate cancer 2009    radiation  . Confusion     PSH: Past Surgical History  Procedure Laterality Date  . Eye surgery  sept. 2015    cataract surgery  . Hernia repair      severa; years can't remeber  . Femur im nail Right 01/18/2015    Procedure: INTRAMEDULLARY (IM) RETROGRADE FEMORAL NAILING;  Surgeon: Renette Butters, MD;  Location: [place];  Service: Orthopedics;  Laterality: Right;    Allergies: No Known Allergies  Medications: Prescriptions prior to admission  Medication Sig Dispense Refill Last Dose  . amLODipine (NORVASC) 5 MG tablet Take 1 tablet (5 mg total) by mouth daily. 30 tablet 1 02/02/2015  . atorvastatin (LIPITOR) 40 MG tablet Take 40 mg by mouth daily.   02/02/2015  . gabapentin (NEURONTIN) 300 MG capsule Take 1 capsule (300 mg total) by mouth at bedtime.   02/01/2015  . insulin aspart (NOVOLOG) 100 UNIT/ML injection insulin aspart (novoLOG) injection 0-15 Units 0-15 Units, Subcutaneous, 3 times  daily with meals CBG 70 - 120: 0 units CBG 121 - 150: 2 units CBG 151 - 200: 3 units CBG 201 - 250: 5 units CBG 251 - 300: 8 units CBG 301 - 350: 11 units CBG 351 - 400: 15 units CBG > 400: call MD 10 mL 11 02/01/2015  . insulin detemir (LEVEMIR) 100 UNIT/ML injection Inject 0.25 mLs (25 Units total) into the skin at bedtime. 10 mL 11 02/01/2015  . ketorolac (ACULAR) 0.5 % ophthalmic solution Place 1 drop into both eyes 3 (three) times daily.   02/02/2015  . HYDROcodone-acetaminophen (NORCO) 5-325 MG per tablet Take one to two tablets by mouth every 6 hours as needed for pain (Patient not taking: Reported on 02/02/2015) 240 tablet 0   . polyethylene glycol (MIRALAX / GLYCOLAX) packet Take 17 g by mouth 2 (two) times daily. 14 each 0 Unknown  . senna-docusate (SENOKOT-S) 8.6-50 MG per tablet Take 2 tablets by mouth at bedtime.   Unknown     Social History: History   Social History  . Marital Status: Married    Spouse Name: N/A    Number of Children: N/A  . Years of Education: N/A   Occupational History  . Not on file.   Social History Main Topics  . Smoking status: Never Smoker   . Smokeless tobacco: Not on file  .  Alcohol Use: No  . Drug Use: No  . Sexual Activity: Not on file   Other Topics Concern  . Not on file   Social History Narrative    Family History: History reviewed. No pertinent family history.  Review of Systems: Positive: Urinary urgency, frequency, leakage Negative: * A further 10 point review of systems was negative except what is listed in the HPI.  Physical Exam: @VITALS2 @ General: No acute distress.  Awake. Head:  Normocephalic.  Atraumatic.Poor dentition ENT:  EOMI.  Mucous membranes moist Neck:  Supple.   CV:            Regular rate. Pulmonary: Equal effort bilaterally.  Clear to auscultation bilaterally. Skin:  Normal turgor.  No visible rash. Extremity: No gross deformity of bilateral upper extremities.  No gross deformity of    bilateral lower  extremities. Neurologic: Alert. Appropriate mood.    Studies:  Recent Labs     02/03/15  0619  02/04/15  1945  02/05/15  0632  HGB  7.7*  7.6*  7.4*  WBC  11.1*   --   11.0*  PLT  240   --   247    Recent Labs     02/04/15  1945  02/05/15  0632  NA  137  138  K  5.5*  5.2*  CL  111  112  CO2  22  21  BUN  60*  61*  CREATININE  2.95*  2.93*  CALCIUM  8.0*  8.1*  GFRNONAA  19*  19*  GFRAA  21*  22*     No results for input(s): INR, APTT in the last 72 hours.  Invalid input(s): PT   Invalid input(s): ABG    Assessment:  Urinary retention. History of proper bladder emptying in past. Most likely secondary to debilitated stte  Plan: 1. Will start on flomax  2.Leave catheter in for now until followup in our [place] office--we will call to set up that appt for 2-3 weeks    [phone]

## 2015-02-06 DIAGNOSIS — N390 Urinary tract infection, site not specified: Secondary | ICD-10-CM

## 2015-02-06 DIAGNOSIS — R339 Retention of urine, unspecified: Secondary | ICD-10-CM

## 2015-02-06 DIAGNOSIS — R319 Hematuria, unspecified: Secondary | ICD-10-CM

## 2015-02-06 DIAGNOSIS — N184 Chronic kidney disease, stage 4 (severe): Secondary | ICD-10-CM

## 2015-02-06 DIAGNOSIS — D509 Iron deficiency anemia, unspecified: Secondary | ICD-10-CM | POA: Diagnosis present

## 2015-02-06 LAB — IRON AND TIBC
Iron: <10 ug/dL — ABNORMAL LOW (ref 42–165)
UIBC: 171 ug/dL (ref 125–400)

## 2015-02-06 LAB — VITAMIN B12: VITAMIN B 12: 451 pg/mL (ref 211–911)

## 2015-02-06 LAB — TYPE AND SCREEN
ABO/RH(D): A POS
Antibody Screen: NEGATIVE
Unit division: 0

## 2015-02-06 LAB — BASIC METABOLIC PANEL
Anion gap: 7 (ref 5–15)
BUN: 55 mg/dL — ABNORMAL HIGH (ref 6–23)
CO2: 20 mmol/L (ref 19–32)
Calcium: 8.3 mg/dL — ABNORMAL LOW (ref 8.4–10.5)
Chloride: 112 mmol/L (ref 96–112)
Creatinine, Ser: 2.78 mg/dL — ABNORMAL HIGH (ref 0.50–1.35)
GFR, EST AFRICAN AMERICAN: 23 mL/min — AB (ref >90)
GFR, EST NON AFRICAN AMERICAN: 20 mL/min — AB (ref >90)
GLUCOSE: 72 mg/dL (ref 70–99)
Potassium: 5.2 mmol/L — ABNORMAL HIGH (ref 3.5–5.1)
Sodium: 139 mmol/L (ref 135–145)

## 2015-02-06 LAB — FERRITIN: Ferritin: 641 ng/mL — ABNORMAL HIGH (ref 22–322)

## 2015-02-06 LAB — GLUCOSE, CAPILLARY
GLUCOSE-CAPILLARY: 170 mg/dL — AB (ref 70–99)
GLUCOSE-CAPILLARY: 106 mg/dL — AB (ref 70–99)
Glucose-Capillary: 135 mg/dL — ABNORMAL HIGH (ref 70–99)
Glucose-Capillary: 74 mg/dL (ref 70–99)
Glucose-Capillary: 92 mg/dL (ref 70–99)
Glucose-Capillary: 91 mg/dL (ref 70–99)

## 2015-02-06 LAB — URINE CULTURE

## 2015-02-06 LAB — HEMOGLOBIN A1C
Hgb A1c MFr Bld: 8 % — ABNORMAL HIGH (ref 4.8–5.6)
MEAN PLASMA GLUCOSE: 183 mg/dL

## 2015-02-06 LAB — OCCULT BLOOD X 1 CARD TO LAB, STOOL: Fecal Occult Bld: NEGATIVE

## 2015-02-06 MED ORDER — LEVOFLOXACIN 750 MG PO TABS
750.0000 mg | ORAL_TABLET | ORAL | Status: DC
  Administered 2015-02-06 – 2015-02-08 (×2): 750 mg via ORAL
  Filled 2015-02-06 (×2): qty 1

## 2015-02-06 MED ORDER — PANTOPRAZOLE SODIUM 40 MG PO TBEC
40.0000 mg | DELAYED_RELEASE_TABLET | Freq: Every day | ORAL | Status: DC
  Administered 2015-02-06: 40 mg via ORAL
  Filled 2015-02-06: qty 1

## 2015-02-06 MED ORDER — AMLODIPINE BESYLATE 5 MG PO TABS
10.0000 mg | ORAL_TABLET | Freq: Every day | ORAL | Status: DC
  Administered 2015-02-07 – 2015-02-09 (×3): 10 mg via ORAL
  Filled 2015-02-06 (×3): qty 2

## 2015-02-06 MED ORDER — POLYSACCHARIDE IRON COMPLEX 150 MG PO CAPS
150.0000 mg | ORAL_CAPSULE | Freq: Every day | ORAL | Status: DC
  Administered 2015-02-07 – 2015-02-08 (×2): 150 mg via ORAL
  Filled 2015-02-06 (×2): qty 1

## 2015-02-06 MED ORDER — SODIUM CHLORIDE 0.9 % IV SOLN
510.0000 mg | Freq: Once | INTRAVENOUS | Status: AC
  Administered 2015-02-07: 510 mg via INTRAVENOUS
  Filled 2015-02-06: qty 17

## 2015-02-06 MED ORDER — AMLODIPINE BESYLATE 5 MG PO TABS
5.0000 mg | ORAL_TABLET | Freq: Once | ORAL | Status: AC
  Administered 2015-02-06: 5 mg via ORAL
  Filled 2015-02-06: qty 1

## 2015-02-06 NOTE — Progress Notes (Signed)
Patient's B/P 171/78,heart rate 101, Dr Grandville Silos notified. Will continue to monitor patient. Family at the bedside.

## 2015-02-06 NOTE — Progress Notes (Signed)
[name] PROGRESS NOTE  Douglas [name] HEN:[id number] DOB: 10/14/1931 DOA: 02/02/2015 PCP: [name]., MD  Assessment/Plan: #1 HCAP Clinical improvement. Change IV antibiotics to oral antibiotics. Continue supportive care. Follow.  #2 hypertension Stable. Increased Norvasc to 10 mg daily.  #3 urinary retention Status post Foley catheter placement. Continue Flomax. Outpatient follow-up with urology.  #4 iron deficiency anemia FOBT is negative. IV iron. Will need oral iron supplementation. Follow H&H.  #5 enterococcus UTI Change IV antibiotics to oral Levaquin to complete a course.  #6 hyperkalemia Change diet to a renal diet. Follow.  #7 chronic kidney disease stage IV Stable. Follow.  #8 diabetes mellitus CBGs have ranged from 74-136. Continue Levemir and sliding scale insulin.  #9 constipation Continue current bowel regimen.  #10 recent right femur fracture PT/OT.  # 11 prophylaxis Lovenox for DVT prophylaxis.   Code Status: Full Family Communication: Updated patient and family at bedside. Disposition Plan: Home versus SNF when medically stable.   Consultants:  Urologist: Dr. Diona Fanti  02/05/2015  Procedures:  Chest x-ray 02/02/2015, 02/05/2015  Lower extremity Dopplers 02/05/2015 negative  Antibiotics:  IV cefepime 02/02/2015>>>> 02/06/2015  IV vancomycin 02/02/2015>>>> 02/06/2015  Oral Levaquin 02/06/15  HPI/Subjective: Patient with no complaints. Patient states breathing has improved. Patient and family state every time patient tries to eat he ends up vomiting his food up.  Objective: Filed Vitals:   02/06/15 1551  BP: 153/73  Pulse: 107  Temp:   Resp:     Intake/Output Summary (Last 24 hours) at 02/06/15 1832 Last data filed at 02/06/15 1712  Gross per 24 hour  Intake 2838.33 ml  Output   1450 ml  Net 1388.33 ml   Filed Weights   02/02/15 1400 02/02/15 1736  Weight: 65.318 kg (144 lb) 62.46 kg (137 lb 11.2 oz)     Exam:   General:  NAD  Cardiovascular: RRR  Respiratory: CTAB anterior lung fields  Abdomen: Soft, nontender, nondistended, positive bowel sounds.  Musculoskeletal: No clubbing cyanosis or edema.  Data Reviewed: Basic Metabolic Panel:  Recent Labs Lab 02/03/15 0619 02/04/15 0706 02/04/15 1945 02/05/15 0632 02/06/15 0528  NA 135 137 137 138 139  K 5.3* 5.5* 5.5* 5.2* 5.2*  CL 111 109 111 112 112  CO2 22 19 22 21 20   GLUCOSE 69* 165* 164* 135* 72  BUN 62* 63* 60* 61* 55*  CREATININE 3.32* 3.10* 2.95* 2.93* 2.78*  CALCIUM 8.2* 8.0* 8.0* 8.1* 8.3*   Liver Function Tests:  Recent Labs Lab 02/02/15 1458  AST 16  ALT 9  ALKPHOS 103  BILITOT 0.5  PROT 7.8  ALBUMIN 3.1*    Recent Labs Lab 02/02/15 1458  LIPASE 28   No results for input(s): AMMONIA in the last 168 hours. CBC:  Recent Labs Lab 02/01/15 0901 02/02/15 1458 02/03/15 0619 02/04/15 1945 02/05/15 0632  WBC 10.6* 15.2* 11.1*  --  11.0*  NEUTROABS 7.7 13.3*  --   --   --   HGB 9.0* 8.9* 7.7* 7.6* 7.4*  HCT 27.8* 26.9* 24.3* 23.5* 23.0*  MCV 88.5 88.2 89.3  --  90.2  PLT 309 252 240  --  247   Cardiac Enzymes:  Recent Labs Lab 02/02/15 1458  TROPONINI <0.03   BNP (last 3 results) No results for input(s): BNP in the last 8760 hours.  ProBNP (last 3 results) No results for input(s): PROBNP in the last 8760 hours.  CBG:  Recent Labs Lab 02/05/15 1658 02/05/15 2153 02/06/15 0740 02/06/15 1144 02/06/15  1640  GLUCAP 149* 136* 74 92 106*    Recent Results (from the past 240 hour(s))  Urine culture     Status: None   Collection Time: 02/02/15  2:43 PM  Result Value Ref Range Status   Specimen Description URINE, CATHETERIZED  Final   Special Requests NONE  Final   Colony Count   Final    >=100,000 COLONIES/ML Performed at [facility]    Culture   Final    ENTEROCOCCUS SPECIES YEAST Performed at [facility]    Report Status 02/06/2015 FINAL  Final    Organism ID, Bacteria ENTEROCOCCUS SPECIES  Final      Susceptibility   Enterococcus species - MIC*    AMPICILLIN <=2 SENSITIVE Sensitive     LEVOFLOXACIN 1 SENSITIVE Sensitive     NITROFURANTOIN <=16 SENSITIVE Sensitive     VANCOMYCIN 2 SENSITIVE Sensitive     TETRACYCLINE >=16 RESISTANT Resistant     * ENTEROCOCCUS SPECIES  MRSA PCR Screening     Status: Abnormal   Collection Time: 02/02/15  7:47 PM  Result Value Ref Range Status   MRSA by PCR POSITIVE (A) NEGATIVE Final    Comment: CRITICAL RESULT CALLED TO, READ BACK BY AND VERIFIED WITH: ROGERS,A AT 10:10PM ON 02/02/15 BY FESTERMAN,C        The GeneXpert MRSA Assay (FDA approved for NASAL specimens only), is one component of a comprehensive MRSA colonization surveillance program. It is not intended to diagnose MRSA infection nor to guide or monitor treatment for MRSA infections.      Studies: US Venous Img Lower Bilateral  02/05/2015   CLINICAL DATA:  History of right femur fracture, now with bilateral lower extremity pain. Evaluate for DVT.  EXAM: BILATERAL LOWER EXTREMITY VENOUS DOPPLER ULTRASOUND  TECHNIQUE: Gray-scale sonography with graded compression, as well as color Doppler and duplex ultrasound were performed to evaluate the lower extremity deep venous systems from the level of the common femoral vein and including the common femoral, femoral, profunda femoral, popliteal and calf veins including the posterior tibial, peroneal and gastrocnemius veins when visible. The superficial great saphenous vein was also interrogated. Spectral Doppler was utilized to evaluate flow at rest and with distal augmentation maneuvers in the common femoral, femoral and popliteal veins.  COMPARISON:  None.  FINDINGS: RIGHT LOWER EXTREMITY  Common Femoral Vein: No evidence of thrombus. Normal compressibility, respiratory phasicity and response to augmentation.  Saphenofemoral Junction: No evidence of thrombus. Normal compressibility and flow on  color Doppler imaging.  Profunda Femoral Vein: No evidence of thrombus. Normal compressibility and flow on color Doppler imaging.  Femoral Vein: No evidence of thrombus. Normal compressibility, respiratory phasicity and response to augmentation.  Popliteal Vein: No evidence of thrombus. Normal compressibility, respiratory phasicity and response to augmentation.  Calf Veins: No evidence of thrombus. Normal compressibility and flow on color Doppler imaging.  Superficial Great Saphenous Vein: No evidence of thrombus. Normal compressibility and flow on color Doppler imaging.  Venous Reflux:  None.  Other Findings:  None.  LEFT LOWER EXTREMITY  Common Femoral Vein: No evidence of thrombus. Normal compressibility, respiratory phasicity and response to augmentation.  Saphenofemoral Junction: No evidence of thrombus. Normal compressibility and flow on color Doppler imaging.  Profunda Femoral Vein: No evidence of thrombus. Normal compressibility and flow on color Doppler imaging.  Femoral Vein: No evidence of thrombus. Normal compressibility, respiratory phasicity and response to augmentation.  Popliteal Vein: No evidence of thrombus. Normal compressibility, respiratory phasicity and response to  augmentation.  Calf Veins: No evidence of thrombus. Normal compressibility and flow on color Doppler imaging.  Superficial Great Saphenous Vein: No evidence of thrombus. Normal compressibility and flow on color Doppler imaging.  Venous Reflux:  None.  Other Findings:  None.  IMPRESSION: No evidence of DVT within either lower extremity.   Electronically Signed   By: Sandi Mariscal M.D.   On: 02/05/2015 17:34   Dg Chest Port 1 View  02/05/2015   CLINICAL DATA:  Shortness of breath. Recent femur fracture 3 weeks ago.  EXAM: PORTABLE CHEST - 1 VIEW  COMPARISON:  02/02/2015  FINDINGS: Shallow inspiration with atelectasis in the lung bases, increasing on the right side since previous study. This may represent developing infiltration. Mild  cardiac enlargement with mild pulmonary vascular congestion. This is also increasing. Blunting of the right costophrenic angle suggests small effusion. No pneumothorax  IMPRESSION: Increasing infiltration or atelectasis in the right lung base. Cardiac enlargement with increasing pulmonary vascular congestion. Small right pleural effusion.   Electronically Signed   By: Lucienne Capers M.D.   On: 02/05/2015 17:32    Scheduled Meds: . sodium chloride   Intravenous Once  . amLODipine  5 mg Oral Daily  . antiseptic oral rinse  7 mL Mouth Rinse BID  . atorvastatin  40 mg Oral Daily  . Chlorhexidine Gluconate Cloth  6 each Topical Q0600  . clotrimazole   Topical BID  . enoxaparin (LOVENOX) injection  30 mg Subcutaneous Q24H  . feeding supplement (ENSURE COMPLETE)  237 mL Oral BID BM  . gabapentin  300 mg Oral QHS  . insulin aspart  0-9 Units Subcutaneous TID WC  . insulin detemir  12 Units Subcutaneous QHS  . ketorolac  1 drop Both Eyes TID  . levofloxacin  750 mg Oral Q48H  . mupirocin ointment  1 application Nasal BID  . senna-docusate  2 tablet Oral QHS  . tamsulosin  0.4 mg Oral Daily   Continuous Infusions: . sodium chloride 100 mL/hr at 02/06/15 1552    Principal Problem:   HCAP (healthcare-associated pneumonia) Active Problems:   Hypertension   Hyperkalemia   CKD (chronic kidney disease) stage 4, GFR 15-29 ml/min   Fever   Urinary tract infectious disease   Yeast dermatitis of penis   Anemia, iron deficiency   Urinary retention    Time spent: 51 minutes    Calise Dunckel M.D. [name] Pager [phone]. If 7PM-7AM, please contact night-coverage at [email or web address], password [email or web address] 02/06/2015, 6:32 PM  LOS: 4 days

## 2015-02-06 NOTE — Care Management Utilization Note (Signed)
UR completed 

## 2015-02-06 NOTE — Progress Notes (Signed)
MEDICATION RELATED CONSULT NOTE - INITIAL   Pharmacy Consult for IV Iron Indication: iron deficient anemia  No Known Allergies  Patient Measurements: Height: 5\' 3"  (160 cm) Weight: 137 lb 11.2 oz (62.46 kg) IBW/kg (Calculated) : 56.9  Vital Signs: Temp: 98 F (36.7 C) (02/10 1417) Temp Source: Oral (02/10 1417) BP: 153/73 mmHg (02/10 1551) Pulse Rate: 107 (02/10 1551) Intake/Output from previous day: 02/09 0701 - 02/10 0700 In: 2160 [P.O.:600; I.V.:1225; Blood:335] Out: 2200 [Urine:2200] Intake/Output from this shift:    Labs:  Recent Labs  02/04/15 1945 02/05/15 0632 02/06/15 0528  WBC  --  11.0*  --   HGB 7.6* 7.4*  --   HCT 23.5* 23.0*  --   PLT  --  247  --   CREATININE 2.95* 2.93* 2.78*   Estimated Creatinine Clearance: 16.8 mL/min (by C-G formula based on Cr of 2.78).   Microbiology: Recent Results (from the past 720 hour(s))  Surgical pcr screen     Status: None   Collection Time: 01/18/15  5:32 AM  Result Value Ref Range Status   MRSA, PCR NEGATIVE NEGATIVE Final   Staphylococcus aureus NEGATIVE NEGATIVE Final    Comment:        The Xpert SA Assay (FDA approved for NASAL specimens in patients over 20 years of age), is one component of a comprehensive surveillance program.  Test performance has been validated by [facility] for patients greater than or equal to 46 year old. It is not intended to diagnose infection nor to guide or monitor treatment.   Urine culture     Status: None   Collection Time: 02/02/15  2:43 PM  Result Value Ref Range Status   Specimen Description URINE, CATHETERIZED  Final   Special Requests NONE  Final   Colony Count   Final    >=100,000 COLONIES/ML Performed at [facility]    Culture   Final    ENTEROCOCCUS SPECIES YEAST Performed at [facility]    Report Status 02/06/2015 FINAL  Final   Organism ID, Bacteria ENTEROCOCCUS SPECIES  Final      Susceptibility   Enterococcus species - MIC*   AMPICILLIN <=2 SENSITIVE Sensitive     LEVOFLOXACIN 1 SENSITIVE Sensitive     NITROFURANTOIN <=16 SENSITIVE Sensitive     VANCOMYCIN 2 SENSITIVE Sensitive     TETRACYCLINE >=16 RESISTANT Resistant     * ENTEROCOCCUS SPECIES  MRSA PCR Screening     Status: Abnormal   Collection Time: 02/02/15  7:47 PM  Result Value Ref Range Status   MRSA by PCR POSITIVE (A) NEGATIVE Final    Comment: CRITICAL RESULT CALLED TO, READ BACK BY AND VERIFIED WITH: ROGERS,A AT 10:10PM ON 02/02/15 BY FESTERMAN,C        The GeneXpert MRSA Assay (FDA approved for NASAL specimens only), is one component of a comprehensive MRSA colonization surveillance program. It is not intended to diagnose MRSA infection nor to guide or monitor treatment for MRSA infections.     Medical History: Past Medical History  Diagnosis Date  . Hypertension   . Diabetes mellitus   . Hyperlipidemia   . Cataracts, bilateral   . Hyperkalemia   . UTI (lower urinary tract infection)   . Calculus of kidney   . Sensorineural hearing loss, unspecified   . Epiglottitis   . Chronic ankle pain   . Neuromuscular disorder     neurophaty lower legs  . Upper GI bleeding     while  at [facility] 12/2014  . Prostate cancer 2009    radiation  . Confusion     Medications:  Scheduled:  . sodium chloride   Intravenous Once  . [START ON 02/07/2015] amLODipine  10 mg Oral Daily  . amLODipine  5 mg Oral Once  . antiseptic oral rinse  7 mL Mouth Rinse BID  . atorvastatin  40 mg Oral Daily  . Chlorhexidine Gluconate Cloth  6 each Topical Q0600  . clotrimazole   Topical BID  . enoxaparin (LOVENOX) injection  30 mg Subcutaneous Q24H  . feeding supplement (ENSURE COMPLETE)  237 mL Oral BID BM  . [START ON 02/07/2015] ferumoxytol  510 mg Intravenous Once  . gabapentin  300 mg Oral QHS  . insulin aspart  0-9 Units Subcutaneous TID WC  . insulin detemir  12 Units Subcutaneous QHS  . [START ON 02/07/2015] iron polysaccharides  150 mg Oral Daily  .  ketorolac  1 drop Both Eyes TID  . levofloxacin  750 mg Oral Q48H  . mupirocin ointment  1 application Nasal BID  . senna-docusate  2 tablet Oral QHS  . tamsulosin  0.4 mg Oral Daily    Assessment: 81 yoM with chronic renal insufficiency noted to have iron deficient anemia.   CBC & anemia panel reviewed.   Goal of Therapy:  Replete iron stores  Plan:  Feraheme 510mg  IV x1 dose in am F/U CBC and anemia panel Repeat dose in 14 days if needed Pharmacy to sign off.  Re-consult if needed.   Biagio Borg 02/06/2015,7:07 PM

## 2015-02-07 DIAGNOSIS — I10 Essential (primary) hypertension: Secondary | ICD-10-CM

## 2015-02-07 DIAGNOSIS — R112 Nausea with vomiting, unspecified: Secondary | ICD-10-CM

## 2015-02-07 DIAGNOSIS — R1110 Vomiting, unspecified: Secondary | ICD-10-CM | POA: Clinically undetermined

## 2015-02-07 LAB — CBC
HCT: 28.2 % — ABNORMAL LOW (ref 39.0–52.0)
Hemoglobin: 8.9 g/dL — ABNORMAL LOW (ref 13.0–17.0)
MCH: 28.7 pg (ref 26.0–34.0)
MCHC: 31.6 g/dL (ref 30.0–36.0)
MCV: 91 fL (ref 78.0–100.0)
Platelets: 235 10*3/uL (ref 150–400)
RBC: 3.1 MIL/uL — ABNORMAL LOW (ref 4.22–5.81)
RDW: 13.6 % (ref 11.5–15.5)
WBC: 13 10*3/uL — ABNORMAL HIGH (ref 4.0–10.5)

## 2015-02-07 LAB — BASIC METABOLIC PANEL
ANION GAP: 8 (ref 5–15)
Anion gap: 8 (ref 5–15)
BUN: 47 mg/dL — ABNORMAL HIGH (ref 6–23)
BUN: 44 mg/dL — AB (ref 6–23)
CHLORIDE: 114 mmol/L — AB (ref 96–112)
CO2: 19 mmol/L (ref 19–32)
CO2: 17 mmol/L — AB (ref 19–32)
CREATININE: 2.78 mg/dL — AB (ref 0.50–1.35)
Calcium: 8.1 mg/dL — ABNORMAL LOW (ref 8.4–10.5)
Calcium: 8.2 mg/dL — ABNORMAL LOW (ref 8.4–10.5)
Chloride: 111 mmol/L (ref 96–112)
Creatinine, Ser: 2.86 mg/dL — ABNORMAL HIGH (ref 0.50–1.35)
GFR calc Af Amer: 22 mL/min — ABNORMAL LOW (ref >90)
GFR calc Af Amer: 23 mL/min — ABNORMAL LOW (ref >90)
GFR calc non Af Amer: 20 mL/min — ABNORMAL LOW (ref >90)
GFR, EST NON AFRICAN AMERICAN: 19 mL/min — AB (ref >90)
Glucose, Bld: 84 mg/dL (ref 70–99)
Glucose, Bld: 113 mg/dL — ABNORMAL HIGH (ref 70–99)
Potassium: 5.4 mmol/L — ABNORMAL HIGH (ref 3.5–5.1)
Potassium: 5.2 mmol/L — ABNORMAL HIGH (ref 3.5–5.1)
SODIUM: 138 mmol/L (ref 135–145)
Sodium: 139 mmol/L (ref 135–145)

## 2015-02-07 LAB — GLUCOSE, CAPILLARY
GLUCOSE-CAPILLARY: 86 mg/dL (ref 70–99)
GLUCOSE-CAPILLARY: 103 mg/dL — AB (ref 70–99)
GLUCOSE-CAPILLARY: 91 mg/dL (ref 70–99)
Glucose-Capillary: 102 mg/dL — ABNORMAL HIGH (ref 70–99)

## 2015-02-07 MED ORDER — METOCLOPRAMIDE HCL 5 MG/ML IJ SOLN
5.0000 mg | Freq: Four times a day (QID) | INTRAMUSCULAR | Status: DC
  Administered 2015-02-07 – 2015-02-09 (×7): 5 mg via INTRAVENOUS
  Filled 2015-02-07 (×7): qty 2

## 2015-02-07 MED ORDER — SODIUM POLYSTYRENE SULFONATE 15 GM/60ML PO SUSP
45.0000 g | Freq: Once | ORAL | Status: AC
  Administered 2015-02-07: 45 g via ORAL
  Filled 2015-02-07: qty 180

## 2015-02-07 MED ORDER — SODIUM CHLORIDE 0.9 % IV SOLN
INTRAVENOUS | Status: DC
  Administered 2015-02-07 – 2015-02-08 (×2): via INTRAVENOUS

## 2015-02-07 MED ORDER — PANTOPRAZOLE SODIUM 40 MG PO TBEC
40.0000 mg | DELAYED_RELEASE_TABLET | Freq: Two times a day (BID) | ORAL | Status: DC
  Administered 2015-02-07 – 2015-02-09 (×4): 40 mg via ORAL
  Filled 2015-02-07 (×4): qty 1

## 2015-02-07 MED ORDER — SODIUM POLYSTYRENE SULFONATE 15 GM/60ML PO SUSP: 60.0000 g | Freq: Once | ORAL | Status: DC

## 2015-02-07 NOTE — Progress Notes (Signed)
Physical Therapy Treatment Patient Details Name: Douglas [name] MRN: [id number] DOB: July 28, 1931 Today's Date: 02/07/2015    History of Present Illness With [name] history of vomiting, diabetes, hypertension, history of prostate cancer status post radiation, hyperlipidemia, recent fracture of the right femur status post intramedullary nailing. Patient was discharged from the skilled nursing facility yesterday, but had [name] complicated stay at the nursing facility that involved urinary retention. Patient currently has [name] Foley catheter placed. The patient's began having fevers and shaking chills and began to vomit this morning. The patient was brought to the hospital for evaluation. There is some concerns of dementia the patient is not reliable for history.     PT Comments    Douglas [name] was seen today in bed, awake, alert, oriented, cooperative and able to follow directions well. Noted (+) Left Lateral Heel Pressure Sore with bluish discoloration. Patient was able to perform actively all therapeutic exercises with least assist. Patient tolerated all therapeutic exercises and activities with no complaints of increase pain or signs of intolerance. Current functional status: bed mobility min guard assist in performing rolling side to side. Transfers and gait not assessed today: Limited by Bowel movement. Nursing Staff informed PT and reported that patient was having 4x incidence of bowel movement for today.      Equipment Recommendations  none    Recommendations for Other Services none    Precautions / Restrictions Precautions Precautions: Fall Required Braces or Orthoses: Knee Immobilizer - Right Knee Immobilizer - Right: On at all times Restrictions Weight Bearing Restrictions: Yes RLE Weight Bearing: Touchdown weight bearing    Mobility  Bed Mobility Overal bed mobility: Needs Assistance Bed Mobility: Rolling Rolling: Min guard      Cognition Arousal/Alertness: Awake/alert Behavior  During Therapy: WFL for tasks assessed/performed Overall Cognitive Status: Within Functional Limits for tasks assessed   Exercises Total Joint Exercises Ankle Circles/Pumps: AROM;Supine;Both;20 reps Quad Sets: AROM;Supine;Both;20 reps Hip ABduction/ADduction: AROM;AAROM;Both;20 reps;Supine Straight Leg Raises: AROM;AAROM;Both;10 reps;20 reps;Supine (AAROM on the Right for 10 reps. AROM on the Left for 20 reps)        Pertinent Vitals/Pain Pain Assessment: No/denies pain           PT Goals (current goals can now be found in the care plan section) Progress towards PT goals: Progressing toward goals    Frequency  Min 3X/week    PT Plan Current plan remains appropriate       End of Session Equipment Utilized During Treatment: Right knee immobilizer Activity Tolerance: Patient tolerated treatment well;Patient limited by fatigue;Other (comment) (Activity was limited by another incident of bowel movement while performing therapeutic activities) Patient left: in bed;with nursing/sitter in room;with family/visitor present     Time: 1408-1500 PT Time Calculation (min) (ACUTE ONLY): 52 min  Charges:  $Therapeutic Exercise: 8-22 mins $Therapeutic Activity: 8-22 mins                          Douglas [name] 02/07/2015, 3:31 PM

## 2015-02-07 NOTE — Progress Notes (Signed)
Patient had an episode of emesis after receiving morning meds and kayexalate.  Dr. Grandville Silos notified.  Orders received to start reglan.  Because unsure of how much kayexalate was received, a kayexalate enema was ordered.  And due to the continued N/V, a gastric emptying study was ordered.  Patient received IV zofran this morning so the gastric emptying study will be in the morning.  Patient had a very large BM prior to receiving the kayexalate enema.  Dr. Grandville Silos notified.  Will have bmet drawn now and hold the enema until we have a current potassium level.  Will continue to monitor patient.

## 2015-02-07 NOTE — Progress Notes (Signed)
[name] PROGRESS NOTE  Douglas [name] JJK:[id number] DOB: [birth year] DOA: 02/02/2015 PCP: [name]., MD  Assessment/Plan: #1 HCAP Clinical improvement. Continue oral antibiotics. Continue supportive care. Follow.  #2 hypertension Stable. Continue Norvasc at 10 mg daily.   #3 urinary retention Status post Foley catheter placement. Continue Flomax. Outpatient follow-up with urology.  #4 iron deficiency anemia FOBT is negative. IV iron. Will need oral iron supplementation. Follow H&H.  #5 enterococcus UTI Continue oral Levaquin to complete a course.  #6 hyperkalemia Patient still with hyperkalemia. Diet has been changed to renal diet. Will give a dose of Kayexalate. Repeat labs this afternoon. Follow.  #7 nausea and emesis Patient with emesis after eating per nursing.? Regurgitation. She does have a history of diabetes consent for possible gastroparesis. Will get a gastric emptying study. Will place empirically on IV Reglan. Speech therapy following.  #8 chronic kidney disease stage IV Stable. Follow.  #9 diabetes mellitus CBGs have ranged from 74-136. Continue Levemir and sliding scale insulin.  #10 constipation Continue current bowel regimen.  #11 recent right femur fracture PT/OT.  # 12 prophylaxis Lovenox for DVT prophylaxis.   Code Status: Full Family Communication: Updated patient and family at bedside. Disposition Plan: Home versus SNF when medically stable.   Consultants:  Urologist: Dr. Diona Fanti  02/05/2015  Procedures:  Chest x-ray 02/02/2015, 02/05/2015  Lower extremity Dopplers 02/05/2015 negative  Antibiotics:  IV cefepime 02/02/2015>>>> 02/06/2015  IV vancomycin 02/02/2015>>>> 02/06/2015  Oral Levaquin 02/06/15  HPI/Subjective: Patient with no complaints. Patient states breathing has improved. Patient and family state every time patient tries to eat he ends up vomiting his food up. Per nursing patient hesitant to eat  secondary to emesis. Patient also vomited up Kayexalate which was given this morning.  Objective: Filed Vitals:   02/07/15 0633  BP: 139/66  Pulse: 87  Temp: 98.4 F (36.9 C)  Resp: 20    Intake/Output Summary (Last 24 hours) at 02/07/15 1128 Last data filed at 02/07/15 3716  Gross per 24 hour  Intake 2003.33 ml  Output   1400 ml  Net 603.33 ml   Filed Weights   02/02/15 1400 02/02/15 1736  Weight: 65.318 kg (144 lb) 62.46 kg (137 lb 11.2 oz)    Exam:   General:  NAD  Cardiovascular: RRR  Respiratory: CTAB anterior lung fields  Abdomen: Soft, nontender, nondistended, positive bowel sounds.  Musculoskeletal: No clubbing cyanosis or edema.  Data Reviewed: Basic Metabolic Panel:  Recent Labs Lab 02/04/15 0706 02/04/15 1945 02/05/15 9678 02/06/15 0528 02/07/15 0621  NA 137 137 138 139 138  K 5.5* 5.5* 5.2* 5.2* 5.4*  CL 109 111 112 112 111  CO2 19 22 21 20 19   GLUCOSE 165* 164* 135* 72 84  BUN 63* 60* 61* 55* 47*  CREATININE 3.10* 2.95* 2.93* 2.78* 2.86*  CALCIUM 8.0* 8.0* 8.1* 8.3* 8.1*   Liver Function Tests:  Recent Labs Lab 02/02/15 1458  AST 16  ALT 9  ALKPHOS 103  BILITOT 0.5  PROT 7.8  ALBUMIN 3.1*    Recent Labs Lab 02/02/15 1458  LIPASE 28   No results for input(s): AMMONIA in the last 168 hours. CBC:  Recent Labs Lab 02/01/15 0901 02/02/15 1458 02/03/15 0619 02/04/15 1945 02/05/15 0632 02/07/15 0621  WBC 10.6* 15.2* 11.1*  --  11.0* 13.0*  NEUTROABS 7.7 13.3*  --   --   --   --   HGB 9.0* 8.9* 7.7* 7.6* 7.4* 8.9*  HCT 27.8* 26.9* 24.3* 23.5*  23.0* 28.2*  MCV 88.5 88.2 89.3  --  90.2 91.0  PLT 309 252 240  --  247 235   Cardiac Enzymes:  Recent Labs Lab 02/02/15 1458  TROPONINI <0.03   BNP (last 3 results) No results for input(s): BNP in the last 8760 hours.  ProBNP (last 3 results) No results for input(s): PROBNP in the last 8760 hours.  CBG:  Recent Labs Lab 02/06/15 0740 02/06/15 1144 02/06/15 1640  02/06/15 2118 02/07/15 0740  GLUCAP 74 92 106* 91 86    Recent Results (from the past 240 hour(s))  Urine culture     Status: None   Collection Time: 02/02/15  2:43 PM  Result Value Ref Range Status   Specimen Description URINE, CATHETERIZED  Final   Special Requests NONE  Final   Colony Count   Final    >=100,000 COLONIES/ML Performed at [facility]    Culture   Final    ENTEROCOCCUS SPECIES YEAST Performed at [facility]    Report Status 02/06/2015 FINAL  Final   Organism ID, Bacteria ENTEROCOCCUS SPECIES  Final      Susceptibility   Enterococcus species - MIC*    AMPICILLIN <=2 SENSITIVE Sensitive     LEVOFLOXACIN 1 SENSITIVE Sensitive     NITROFURANTOIN <=16 SENSITIVE Sensitive     VANCOMYCIN 2 SENSITIVE Sensitive     TETRACYCLINE >=16 RESISTANT Resistant     * ENTEROCOCCUS SPECIES  MRSA PCR Screening     Status: Abnormal   Collection Time: 02/02/15  7:47 PM  Result Value Ref Range Status   MRSA by PCR POSITIVE (A) NEGATIVE Final    Comment: CRITICAL RESULT CALLED TO, READ BACK BY AND VERIFIED WITH: ROGERS,A AT 10:10PM ON 02/02/15 BY FESTERMAN,C        The GeneXpert MRSA Assay (FDA approved for NASAL specimens only), is one component of a comprehensive MRSA colonization surveillance program. It is not intended to diagnose MRSA infection nor to guide or monitor treatment for MRSA infections.      Studies: US Venous Img Lower Bilateral  02/05/2015   CLINICAL DATA:  History of right femur fracture, now with bilateral lower extremity pain. Evaluate for DVT.  EXAM: BILATERAL LOWER EXTREMITY VENOUS DOPPLER ULTRASOUND  TECHNIQUE: Gray-scale sonography with graded compression, as well as color Doppler and duplex ultrasound were performed to evaluate the lower extremity deep venous systems from the level of the common femoral vein and including the common femoral, femoral, profunda femoral, popliteal and calf veins including the posterior tibial,  peroneal and gastrocnemius veins when visible. The superficial great saphenous vein was also interrogated. Spectral Doppler was utilized to evaluate flow at rest and with distal augmentation maneuvers in the common femoral, femoral and popliteal veins.  COMPARISON:  None.  FINDINGS: RIGHT LOWER EXTREMITY  Common Femoral Vein: No evidence of thrombus. Normal compressibility, respiratory phasicity and response to augmentation.  Saphenofemoral Junction: No evidence of thrombus. Normal compressibility and flow on color Doppler imaging.  Profunda Femoral Vein: No evidence of thrombus. Normal compressibility and flow on color Doppler imaging.  Femoral Vein: No evidence of thrombus. Normal compressibility, respiratory phasicity and response to augmentation.  Popliteal Vein: No evidence of thrombus. Normal compressibility, respiratory phasicity and response to augmentation.  Calf Veins: No evidence of thrombus. Normal compressibility and flow on color Doppler imaging.  Superficial Great Saphenous Vein: No evidence of thrombus. Normal compressibility and flow on color Doppler imaging.  Venous Reflux:  None.  Other Findings:  None.  LEFT LOWER EXTREMITY  Common Femoral Vein: No evidence of thrombus. Normal compressibility, respiratory phasicity and response to augmentation.  Saphenofemoral Junction: No evidence of thrombus. Normal compressibility and flow on color Doppler imaging.  Profunda Femoral Vein: No evidence of thrombus. Normal compressibility and flow on color Doppler imaging.  Femoral Vein: No evidence of thrombus. Normal compressibility, respiratory phasicity and response to augmentation.  Popliteal Vein: No evidence of thrombus. Normal compressibility, respiratory phasicity and response to augmentation.  Calf Veins: No evidence of thrombus. Normal compressibility and flow on color Doppler imaging.  Superficial Great Saphenous Vein: No evidence of thrombus. Normal compressibility and flow on color Doppler imaging.   Venous Reflux:  None.  Other Findings:  None.  IMPRESSION: No evidence of DVT within either lower extremity.   Electronically Signed   By: Sandi Mariscal M.D.   On: 02/05/2015 17:34   Dg Chest Port 1 View  02/05/2015   CLINICAL DATA:  Shortness of breath. Recent femur fracture 3 weeks ago.  EXAM: PORTABLE CHEST - 1 VIEW  COMPARISON:  02/02/2015  FINDINGS: Shallow inspiration with atelectasis in the lung bases, increasing on the right side since previous study. This may represent developing infiltration. Mild cardiac enlargement with mild pulmonary vascular congestion. This is also increasing. Blunting of the right costophrenic angle suggests small effusion. No pneumothorax  IMPRESSION: Increasing infiltration or atelectasis in the right lung base. Cardiac enlargement with increasing pulmonary vascular congestion. Small right pleural effusion.   Electronically Signed   By: Lucienne Capers M.D.   On: 02/05/2015 17:32    Scheduled Meds: . sodium chloride   Intravenous Once  . amLODipine  10 mg Oral Daily  . antiseptic oral rinse  7 mL Mouth Rinse BID  . atorvastatin  40 mg Oral Daily  . clotrimazole   Topical BID  . enoxaparin (LOVENOX) injection  30 mg Subcutaneous Q24H  . feeding supplement (ENSURE COMPLETE)  237 mL Oral BID BM  . gabapentin  300 mg Oral QHS  . insulin aspart  0-9 Units Subcutaneous TID WC  . insulin detemir  12 Units Subcutaneous QHS  . iron polysaccharides  150 mg Oral Daily  . ketorolac  1 drop Both Eyes TID  . levofloxacin  750 mg Oral Q48H  . metoCLOPramide (REGLAN) injection  5 mg Intravenous 4 times per day  . pantoprazole  40 mg Oral BID AC  . senna-docusate  2 tablet Oral QHS  . sodium polystyrene  60 g Rectal Once  . tamsulosin  0.4 mg Oral Daily   Continuous Infusions:    Principal Problem:   HCAP (healthcare-associated pneumonia) Active Problems:   Hypertension   Hyperkalemia   CKD (chronic kidney disease) stage 4, GFR 15-29 ml/min   Fever   Urinary tract  infectious disease   Yeast dermatitis of penis   Anemia, iron deficiency   Urinary retention   Emesis    Time spent: 67 minutes    THOMPSON,DANIEL M.D. [name] Pager [phone]. If 7PM-7AM, please contact night-coverage at [email or web address], password [facility] 02/07/2015, 11:28 AM  LOS: 5 days

## 2015-02-08 ENCOUNTER — Encounter (HOSPITAL_COMMUNITY): Payer: Self-pay

## 2015-02-08 ENCOUNTER — Inpatient Hospital Stay (HOSPITAL_COMMUNITY): Payer: Medicare Other

## 2015-02-08 DIAGNOSIS — R1111 Vomiting without nausea: Secondary | ICD-10-CM

## 2015-02-08 DIAGNOSIS — E1143 Type 2 diabetes mellitus with diabetic autonomic (poly)neuropathy: Secondary | ICD-10-CM

## 2015-02-08 DIAGNOSIS — K3184 Gastroparesis: Secondary | ICD-10-CM

## 2015-02-08 HISTORY — DX: Type 2 diabetes mellitus with diabetic autonomic (poly)neuropathy: K31.84

## 2015-02-08 HISTORY — DX: Type 2 diabetes mellitus with diabetic autonomic (poly)neuropathy: E11.43

## 2015-02-08 LAB — BASIC METABOLIC PANEL
ANION GAP: 7 (ref 5–15)
BUN: 43 mg/dL — AB (ref 6–23)
CHLORIDE: 114 mmol/L — AB (ref 96–112)
CO2: 19 mmol/L (ref 19–32)
Calcium: 8.5 mg/dL (ref 8.4–10.5)
Creatinine, Ser: 2.72 mg/dL — ABNORMAL HIGH (ref 0.50–1.35)
GFR calc non Af Amer: 20 mL/min — ABNORMAL LOW (ref >90)
GFR, EST AFRICAN AMERICAN: 24 mL/min — AB (ref >90)
Glucose, Bld: 113 mg/dL — ABNORMAL HIGH (ref 70–99)
POTASSIUM: 4 mmol/L (ref 3.5–5.1)
SODIUM: 140 mmol/L (ref 135–145)

## 2015-02-08 LAB — CBC
HEMATOCRIT: 28.2 % — AB (ref 39.0–52.0)
HEMOGLOBIN: 9.3 g/dL — AB (ref 13.0–17.0)
MCH: 29.4 pg (ref 26.0–34.0)
MCHC: 33 g/dL (ref 30.0–36.0)
MCV: 89.2 fL (ref 78.0–100.0)
Platelets: 232 10*3/uL (ref 150–400)
RBC: 3.16 MIL/uL — ABNORMAL LOW (ref 4.22–5.81)
RDW: 13.5 % (ref 11.5–15.5)
WBC: 8.6 10*3/uL (ref 4.0–10.5)

## 2015-02-08 LAB — GLUCOSE, CAPILLARY
GLUCOSE-CAPILLARY: 110 mg/dL — AB (ref 70–99)
Glucose-Capillary: 103 mg/dL — ABNORMAL HIGH (ref 70–99)
Glucose-Capillary: 104 mg/dL — ABNORMAL HIGH (ref 70–99)

## 2015-02-08 LAB — CLOSTRIDIUM DIFFICILE BY PCR: Toxigenic C. Difficile by PCR: NEGATIVE

## 2015-02-08 LAB — FOLATE: FOLATE: 5.2 ng/mL

## 2015-02-08 MED ORDER — LOPERAMIDE HCL 2 MG PO CAPS
2.0000 mg | ORAL_CAPSULE | Freq: Four times a day (QID) | ORAL | Status: DC | PRN
  Administered 2015-02-08: 2 mg via ORAL
  Filled 2015-02-08: qty 1

## 2015-02-08 MED ORDER — TECHNETIUM TC 99M SULFUR COLLOID
2.0000 | Freq: Once | INTRAVENOUS | Status: AC | PRN
  Administered 2015-02-08: 2 via ORAL

## 2015-02-08 NOTE — Progress Notes (Signed)
[name] PROGRESS NOTE  Endrit Gittins Kingsberry DJS:[id number] DOB: 03-Dec-1931 DOA: 02/02/2015 PCP: [name]., MD  Assessment/Plan: #1 HCAP Clinical improvement. Continue oral antibiotics. Continue supportive care. Follow.  #2 hypertension Stable. Continue Norvasc at 10 mg daily.   #3 urinary retention Status post Foley catheter placement. Continue Flomax. Outpatient follow-up with urology.  #4 iron deficiency anemia FOBT is negative. Status post IV iron. Will place on oral iron supplementation. Follow H&H.  #5 enterococcus UTI Continue oral Levaquin to complete a course.  #6 hyperkalemia Resolved.  #7 nausea and emesis Patient with emesis after eating per nursing.? Regurgitation. She does have a history of diabetes consent for possible gastroparesis. Likely secondary to gastroparesis. Patient has just returned from gastric emptying study. Continue IV Reglan. Follow.   #8 chronic kidney disease stage IV Stable. Follow.  #9 diabetes mellitus CBGs have ranged from 91-110. Continue Levemir and sliding scale insulin.  #10 constipation Continue current bowel regimen.  #11 recent right femur fracture PT/OT.  # 12 prophylaxis Lovenox for DVT prophylaxis.   Code Status: Full Family Communication: Updated patient and family at bedside. Disposition Plan: Home with home health when medically stable.   Consultants:  Urologist: Dr. Diona Fanti  02/05/2015  Procedures:  Chest x-ray 02/02/2015, 02/05/2015  Lower extremity Dopplers 02/05/2015 negative  Gastric empty study 02/08/2015  Antibiotics:  IV cefepime 02/02/2015>>>> 02/06/2015  IV vancomycin 02/02/2015>>>> 02/06/2015  Oral Levaquin 02/06/15  HPI/Subjective: Patient with no complaints. Patient states breathing has improved. Patient has just returned from a gastric emptying study, no complaints.   Objective: Filed Vitals:   02/08/15 0616  BP: 154/62  Pulse: 82  Temp: 97.4 F (36.3 C)  Resp: 16     Intake/Output Summary (Last 24 hours) at 02/08/15 1218 Last data filed at 02/08/15 8850  Gross per 24 hour  Intake 436.67 ml  Output   1000 ml  Net -563.33 ml   Filed Weights   02/02/15 1400 02/02/15 1736  Weight: 65.318 kg (144 lb) 62.46 kg (137 lb 11.2 oz)    Exam:   General:  NAD  Cardiovascular: RRR  Respiratory: CTAB anterior lung fields  Abdomen: Soft, nontender, nondistended, positive bowel sounds.  Musculoskeletal: No clubbing cyanosis or edema.  Data Reviewed: Basic Metabolic Panel:  Recent Labs Lab 02/04/15 1945 02/05/15 2774 02/06/15 0528 02/07/15 0621 02/07/15 1306  NA 137 138 139 138 139  K 5.5* 5.2* 5.2* 5.4* 5.2*  CL 111 112 112 111 114*  CO2 22 21 20 19  17*  GLUCOSE 164* 135* 72 84 113*  BUN 60* 61* 55* 47* 44*  CREATININE 2.95* 2.93* 2.78* 2.86* 2.78*  CALCIUM 8.0* 8.1* 8.3* 8.1* 8.2*   Liver Function Tests:  Recent Labs Lab 02/02/15 1458  AST 16  ALT 9  ALKPHOS 103  BILITOT 0.5  PROT 7.8  ALBUMIN 3.1*    Recent Labs Lab 02/02/15 1458  LIPASE 28   No results for input(s): AMMONIA in the last 168 hours. CBC:  Recent Labs Lab 02/02/15 1458 02/03/15 0619 02/04/15 1945 02/05/15 0632 02/07/15 0621  WBC 15.2* 11.1*  --  11.0* 13.0*  NEUTROABS 13.3*  --   --   --   --   HGB 8.9* 7.7* 7.6* 7.4* 8.9*  HCT 26.9* 24.3* 23.5* 23.0* 28.2*  MCV 88.2 89.3  --  90.2 91.0  PLT 252 240  --  247 235   Cardiac Enzymes:  Recent Labs Lab 02/02/15 1458  TROPONINI <0.03   BNP (last 3 results) No  results for input(s): BNP in the last 8760 hours.  ProBNP (last 3 results) No results for input(s): PROBNP in the last 8760 hours.  CBG:  Recent Labs Lab 02/07/15 0740 02/07/15 1123 02/07/15 1641 02/07/15 2103 02/08/15 1153  GLUCAP 86 102* 103* 91 110*    Recent Results (from the past 240 hour(s))  Urine culture     Status: None   Collection Time: 02/02/15  2:43 PM  Result Value Ref Range Status   Specimen Description  URINE, CATHETERIZED  Final   Special Requests NONE  Final   Colony Count   Final    >=100,000 COLONIES/ML Performed at [facility]    Culture   Final    ENTEROCOCCUS SPECIES YEAST Performed at [facility]    Report Status 02/06/2015 FINAL  Final   Organism ID, Bacteria ENTEROCOCCUS SPECIES  Final      Susceptibility   Enterococcus species - MIC*    AMPICILLIN <=2 SENSITIVE Sensitive     LEVOFLOXACIN 1 SENSITIVE Sensitive     NITROFURANTOIN <=16 SENSITIVE Sensitive     VANCOMYCIN 2 SENSITIVE Sensitive     TETRACYCLINE >=16 RESISTANT Resistant     * ENTEROCOCCUS SPECIES  MRSA PCR Screening     Status: Abnormal   Collection Time: 02/02/15  7:47 PM  Result Value Ref Range Status   MRSA by PCR POSITIVE (A) NEGATIVE Final    Comment: CRITICAL RESULT CALLED TO, READ BACK BY AND VERIFIED WITH: ROGERS,A AT 10:10PM ON 02/02/15 BY FESTERMAN,C        The GeneXpert MRSA Assay (FDA approved for NASAL specimens only), is one component of a comprehensive MRSA colonization surveillance program. It is not intended to diagnose MRSA infection nor to guide or monitor treatment for MRSA infections.      Studies: No results found.  Scheduled Meds: . amLODipine  10 mg Oral Daily  . antiseptic oral rinse  7 mL Mouth Rinse BID  . atorvastatin  40 mg Oral Daily  . clotrimazole   Topical BID  . enoxaparin (LOVENOX) injection  30 mg Subcutaneous Q24H  . feeding supplement (ENSURE COMPLETE)  237 mL Oral BID BM  . gabapentin  300 mg Oral QHS  . insulin aspart  0-9 Units Subcutaneous TID WC  . insulin detemir  12 Units Subcutaneous QHS  . iron polysaccharides  150 mg Oral Daily  . ketorolac  1 drop Both Eyes TID  . levofloxacin  750 mg Oral Q48H  . metoCLOPramide (REGLAN) injection  5 mg Intravenous 4 times per day  . pantoprazole  40 mg Oral BID AC  . senna-docusate  2 tablet Oral QHS  . sodium polystyrene  60 g Rectal Once  . tamsulosin  0.4 mg Oral Daily    Continuous Infusions: . sodium chloride 50 mL/hr at 02/07/15 2354    Principal Problem:   HCAP (healthcare-associated pneumonia) Active Problems:   Hypertension   Hyperkalemia   CKD (chronic kidney disease) stage 4, GFR 15-29 ml/min   Fever   Urinary tract infectious disease   Yeast dermatitis of penis   Anemia, iron deficiency   Urinary retention   Emesis    Time spent: 39 minutes    Alyne Martinson M.D. [name] Pager [phone]. If 7PM-7AM, please contact night-coverage at [email or web address], password [facility] 02/08/2015, 12:18 PM  LOS: 6 days

## 2015-02-08 NOTE — Progress Notes (Signed)
SLP Cancellation Note  Patient Details Name: Douglas [name] MRN: [id number] DOB: 1931-06-06   Cancelled treatment:       Reason Eval/Treat Not Completed: Patient at procedure or test/unavailable; Pt down for gastric emptying study.  Thank you,  Genene Churn, [place]    [place] 02/08/2015, 10:00 AM

## 2015-02-08 NOTE — Progress Notes (Signed)
OT Cancellation Note  Patient Details Name: Douglas [name] MRN: [id number] DOB: 1931/01/24   Cancelled Treatment:    Reason Eval/Treat Not Completed: Patient at procedure or test/ unavailable. Will re-attempt OT eval when able.  Ailene Ravel, OTR/L,CBIS  [phone]  02/08/2015, 8:50 AM

## 2015-02-08 NOTE — Progress Notes (Signed)
Pt has been nauseous and vomiting throughout the day per report. The blood sugar was 91 and MD states to give the nightly dose of 12 units of Levemir.

## 2015-02-09 DIAGNOSIS — E1143 Type 2 diabetes mellitus with diabetic autonomic (poly)neuropathy: Secondary | ICD-10-CM

## 2015-02-09 LAB — RENAL FUNCTION PANEL
ANION GAP: 8 (ref 5–15)
Albumin: 2.4 g/dL — ABNORMAL LOW (ref 3.5–5.2)
BUN: 41 mg/dL — AB (ref 6–23)
CHLORIDE: 115 mmol/L — AB (ref 96–112)
CO2: 20 mmol/L (ref 19–32)
Calcium: 8.2 mg/dL — ABNORMAL LOW (ref 8.4–10.5)
Creatinine, Ser: 2.59 mg/dL — ABNORMAL HIGH (ref 0.50–1.35)
GFR, EST AFRICAN AMERICAN: 25 mL/min — AB (ref >90)
GFR, EST NON AFRICAN AMERICAN: 22 mL/min — AB (ref >90)
Glucose, Bld: 49 mg/dL — ABNORMAL LOW (ref 70–99)
POTASSIUM: 3.9 mmol/L (ref 3.5–5.1)
Phosphorus: 3.4 mg/dL (ref 2.3–4.6)
Sodium: 143 mmol/L (ref 135–145)

## 2015-02-09 LAB — CBC
HCT: 25.5 % — ABNORMAL LOW (ref 39.0–52.0)
HEMOGLOBIN: 8.2 g/dL — AB (ref 13.0–17.0)
MCH: 28.6 pg (ref 26.0–34.0)
MCHC: 32.2 g/dL (ref 30.0–36.0)
MCV: 88.9 fL (ref 78.0–100.0)
Platelets: 226 10*3/uL (ref 150–400)
RBC: 2.87 MIL/uL — ABNORMAL LOW (ref 4.22–5.81)
RDW: 13.5 % (ref 11.5–15.5)
WBC: 7.4 10*3/uL (ref 4.0–10.5)

## 2015-02-09 LAB — GLUCOSE, CAPILLARY
GLUCOSE-CAPILLARY: 48 mg/dL — AB (ref 70–99)
GLUCOSE-CAPILLARY: 76 mg/dL (ref 70–99)
GLUCOSE-CAPILLARY: 158 mg/dL — AB (ref 70–99)

## 2015-02-09 MED ORDER — DOCUSATE SODIUM 100 MG PO CAPS: 100.0000 mg | ORAL_CAPSULE | Freq: Every day | ORAL | Status: DC | PRN

## 2015-02-09 MED ORDER — POLYETHYLENE GLYCOL 3350 17 G PO PACK: 17.0000 g | PACK | Freq: Two times a day (BID) | ORAL | Status: DC | PRN

## 2015-02-09 MED ORDER — PANTOPRAZOLE SODIUM 40 MG PO TBEC: 40.0000 mg | DELAYED_RELEASE_TABLET | Freq: Every day | ORAL | Status: DC

## 2015-02-09 MED ORDER — TAMSULOSIN HCL 0.4 MG PO CAPS: 0.4000 mg | ORAL_CAPSULE | Freq: Every day | ORAL | Status: AC

## 2015-02-09 MED ORDER — POLYSACCHARIDE IRON COMPLEX 150 MG PO CAPS: 150.0000 mg | ORAL_CAPSULE | Freq: Two times a day (BID) | ORAL | Status: DC

## 2015-02-09 MED ORDER — ENSURE COMPLETE PO LIQD: 237.0000 mL | Freq: Two times a day (BID) | ORAL | Status: DC

## 2015-02-09 MED ORDER — OXYBUTYNIN CHLORIDE 5 MG PO TABS: 5.0000 mg | ORAL_TABLET | Freq: Three times a day (TID) | ORAL | Status: AC | PRN

## 2015-02-09 MED ORDER — INSULIN DETEMIR 100 UNIT/ML ~~LOC~~ SOLN: 10.0000 [IU] | Freq: Every day | SUBCUTANEOUS | Status: AC

## 2015-02-09 MED ORDER — CLOTRIMAZOLE 1 % EX CREA: TOPICAL_CREAM | Freq: Two times a day (BID) | CUTANEOUS | Status: DC

## 2015-02-09 MED ORDER — POLYSACCHARIDE IRON COMPLEX 150 MG PO CAPS
150.0000 mg | ORAL_CAPSULE | Freq: Two times a day (BID) | ORAL | Status: DC
  Administered 2015-02-09: 150 mg via ORAL
  Filled 2015-02-09: qty 1

## 2015-02-09 MED ORDER — METOCLOPRAMIDE HCL 10 MG PO TABS
5.0000 mg | ORAL_TABLET | Freq: Three times a day (TID) | ORAL | Status: DC
  Administered 2015-02-09: 5 mg via ORAL
  Filled 2015-02-09: qty 1

## 2015-02-09 MED ORDER — METOCLOPRAMIDE HCL 5 MG PO TABS: 5.0000 mg | ORAL_TABLET | Freq: Three times a day (TID) | ORAL | Status: DC

## 2015-02-09 MED ORDER — TRAMADOL HCL 50 MG PO TABS: 100.0000 mg | ORAL_TABLET | Freq: Four times a day (QID) | ORAL | Status: DC | PRN

## 2015-02-09 MED ORDER — SENNOSIDES-DOCUSATE SODIUM 8.6-50 MG PO TABS: 2.0000 | ORAL_TABLET | Freq: Every day | ORAL | Status: DC

## 2015-02-09 NOTE — Discharge Summary (Signed)
Physician Discharge Summary  Douglas [name] QMV:[id number] DOB: [birth year] DOA: 02/02/2015  PCP: [name]., MD  Admit date: 02/02/2015 Discharge date: 02/09/2015  Time spent: 70 minutes  Recommendations for Outpatient Follow-up:  1. Follow-up with [name],[name], MD in 1 week. On follow-up a basic metabolic profile needs to be obtained to follow-up on electrolytes and renal function. CBC will also need to be obtained to follow-up on patient's hemoglobin. Patient has been started on oral iron supplementation for iron deficiency anemia. Patient has also been started on Reglan for diabetic gastroparesis which need to be followed up upon. 2. Follow-up with Dr. Jeffie Pollock on urology in 2 weeks for voiding trial and further evaluation and management of his urinary retention.  Discharge Diagnoses:  Principal Problem:   HCAP (healthcare-associated pneumonia) Active Problems:   Hypertension   Hyperkalemia   CKD (chronic kidney disease) stage 4, GFR 15-29 ml/min   Fever   Urinary tract infectious disease   Yeast dermatitis of penis   Anemia, iron deficiency   Urinary retention   Emesis   Gastroparesis due to DM: DELAYED GASTRIC EMPTYING STUDY 02/08/15   Discharge Condition: Stable and improved  Diet recommendation: Carb modified  Filed Weights   02/02/15 1400 02/02/15 1736  Weight: 65.318 kg (144 lb) 62.46 kg (137 lb 11.2 oz)    History of present illness:  Douglas [name] is a 81 y.o. male  With a history of vomiting, diabetes, hypertension, history of prostate cancer status post radiation, hyperlipidemia, recent fracture of the right femur status post intramedullary nailing. Patient was discharged from the skilled nursing facility one day prior to admission, but had a complicated stay at the nursing facility that involved urinary retention. Patient currently has a Foley catheter placed. The patient's began having fevers and shaking chills and began to vomit on the day of admission.  The  patient was brought to the hospital for evaluation. There was some concerns of dementia the patient is not a reliable historian.  Hospital Course:  #1 HCAP Patient had presented with fever and chills on admission. Chest x-ray which was obtained showed increasing infiltration at the right lung base as well as some increasing pulmonary vascular congestion. Patient was admitted and treated for healthcare associated pneumonia. Patient was placed empirically on IV vancomycin and IV cefepime. Patient improved clinically IV vancomycin IV cefepime was subsequently transitioned to oral Levaquin. Patient received a one-week course of antibiotic therapy. Patient be discharged home in stable and improved condition.   #2 hypertension Stable. Continued on Norvasc at 10 mg daily.   #3 urinary retention Patient was noted to have a urinary retention during the hospitalization. Foley catheter was placed. Patient was started on Flomax. Urology was consulted and patient was seen in consultation by Dr. Diona Fanti. It was recommended that patient be continued on the Foley catheter and it be maintained on discharge with outpatient follow-up with Dr. Jeffie Pollock in 2 weeks for voiding trial and further evaluation and management.  #4 iron deficiency anemia Patient during the hospitalization was noted to be anemic. Anemia panel which was done was consistent with iron deficiency anemia. Patient was given IV iron during the hospitalization and will be discharged home on oral iron supplementations. Outpatient follow-up.   #5 enterococcus UTI On admission urinalysis which was done was consistent with a urinary tract infection. Patient was empirically on IV vancomycin and cefepime secondary to treatment for healthcare associated pneumonia. Urine cultures grew enterococcus species. Patient's antibiotics were subsequently transitioned to Levaquin and patient  received a 7-8 day course of antibiotic therapy.   #6 hyperkalemia Patient was  noted to be hypokalemic during the hospitalization. Patient's diet was changed to a renal/Modified diet. Patient was given some Kayexalate with resolution of his hyperkalemia.   #7 nausea and emesis secondary to diabetic gastroparesis General hospitalization patient was noted to have some nausea and emesis every time he ate his meals. Patient did have a history of ongoing diabetes and due to concerns for possible gastroparesis a gastric emptying study was obtained. Patient was also placed empirically on IV Reglan. Gastric emptying study was consistent with gastroparesis. Patient improved clinically and was tolerating a diet without any further nausea or emesis. Patient will be discharged home on oral Reglan before meals and at bedtime. Outpatient follow-up.  #8 chronic kidney disease stage IV Stable. Follow.  #9 diabetes mellitus Patient was maintained on the lower dose of Levemir and sliding scale insulin with good CBG control. Patient will be discharged on this new regimen.  #10 constipation Patient was placed on a bowel regimen with resolution of his constipation.   #11 recent right femur fracture PT/OT. Outpatient follow-up.  Procedures:  Chest x-ray 02/02/2015, 02/05/2015  Lower extremity Dopplers 02/05/2015 negative  Gastric emptying study 02/08/2015    Consultations:  Urologist: Dr. Diona Fanti 02/05/2015    Discharge Exam: Filed Vitals:   02/09/15 1011  BP: 142/72  Pulse:   Temp:   Resp:     General: NAD Cardiovascular: RRR Respiratory: CTAB anterior lung fields.  Discharge Instructions   Discharge Instructions    Diet Carb Modified    Complete by:  As directed      Discharge instructions    Complete by:  As directed   Follow up with [name],[name], MD in 1 week. Follow up with Dr Jeffie Pollock in 2 weeks.     Increase activity slowly    Complete by:  As directed           Current Discharge Medication List    START taking these medications   Details   clotrimazole (LOTRIMIN) 1 % cream Apply topically 2 (two) times daily. Qty: 30 g, Refills: 0    docusate sodium (COLACE) 100 MG capsule Take 1 capsule (100 mg total) by mouth daily as needed for mild constipation. Qty: 10 capsule, Refills: 0    feeding supplement, ENSURE COMPLETE, (ENSURE COMPLETE) LIQD Take 237 mLs by mouth 2 (two) times daily between meals.    iron polysaccharides (NIFEREX) 150 MG capsule Take 1 capsule (150 mg total) by mouth 2 (two) times daily. Qty: 60 capsule, Refills: 0    metoCLOPramide (REGLAN) 5 MG tablet Take 1 tablet (5 mg total) by mouth 4 (four) times daily -  before meals and at bedtime. Qty: 120 tablet, Refills: 0    oxybutynin (DITROPAN) 5 MG tablet Take 1 tablet (5 mg total) by mouth every 8 (eight) hours as needed for bladder spasms. Qty: 20 tablet, Refills: 0    pantoprazole (PROTONIX) 40 MG tablet Take 1 tablet (40 mg total) by mouth daily. Qty: 30 tablet, Refills: 0    tamsulosin (FLOMAX) 0.4 MG CAPS capsule Take 1 capsule (0.4 mg total) by mouth daily. Qty: 30 capsule, Refills: 0    traMADol (ULTRAM) 50 MG tablet Take 2 tablets (100 mg total) by mouth every 6 (six) hours as needed for severe pain. Qty: 20 tablet, Refills: 0      CONTINUE these medications which have CHANGED   Details  insulin detemir (LEVEMIR) 100  UNIT/ML injection Inject 0.1 mLs (10 Units total) into the skin at bedtime. Qty: 10 mL, Refills: 11    polyethylene glycol (MIRALAX / GLYCOLAX) packet Take 17 g by mouth 2 (two) times daily as needed for moderate constipation. Hold if develops diarrhea. Qty: 14 each, Refills: 0    senna-docusate (SENOKOT-S) 8.6-50 MG per tablet Take 2 tablets by mouth at bedtime. Hold if develops diarrhea.      CONTINUE these medications which have NOT CHANGED   Details  amLODipine (NORVASC) 5 MG tablet Take 1 tablet (5 mg total) by mouth daily. Qty: 30 tablet, Refills: 1    atorvastatin (LIPITOR) 40 MG tablet Take 40 mg by mouth daily.     gabapentin (NEURONTIN) 300 MG capsule Take 1 capsule (300 mg total) by mouth at bedtime.    insulin aspart (NOVOLOG) 100 UNIT/ML injection insulin aspart (novoLOG) injection 0-15 Units 0-15 Units, Subcutaneous, 3 times daily with meals CBG 70 - 120: 0 units CBG 121 - 150: 2 units CBG 151 - 200: 3 units CBG 201 - 250: 5 units CBG 251 - 300: 8 units CBG 301 - 350: 11 units CBG 351 - 400: 15 units CBG > 400: call MD Qty: 10 mL, Refills: 11    ketorolac (ACULAR) 0.5 % ophthalmic solution Place 1 drop into both eyes 3 (three) times daily.      STOP taking these medications     HYDROcodone-acetaminophen (NORCO) 5-325 MG per tablet        No Known Allergies Follow-up Information    Follow up with [name],[name], MD. Schedule an appointment as soon as possible for a visit in 1 week.   Specialty:  Internal Medicine   Contact information:   [place] [address] [phone]       Follow up with Malka So, MD. Schedule an appointment as soon as possible for a visit in 2 weeks.   Specialty:  Urology   Contact information:   [address] [place] [address] [phone]        The results of significant diagnostics from this hospitalization (including imaging, microbiology, ancillary and laboratory) are listed below for reference.    Significant Diagnostic Studies: Dg Chest 1 View  01/24/2015   CLINICAL DATA:  Congestion and fever.  EXAM: CHEST - 1 VIEW  COMPARISON:  01/17/2015.  FINDINGS: Mediastinum and hilar structures are normal. Bibasilar subsegmental axis and or scarring. No acute infiltrate. No pleural effusion or pneumothorax. Heart size stable. Degenerative changes thoracic spine.  IMPRESSION: Bibasilar subsegmental atelectasis and/or scarring. No acute cardiopulmonary disease.   Electronically Signed   By: Marcello Moores  Register   On: 01/24/2015 11:17   Nm Gastric Emptying  02/08/2015   CLINICAL DATA:  History of reflux, bloating, and early satiety   EXAM: NUCLEAR MEDICINE GASTRIC EMPTYING SCAN  TECHNIQUE: After oral ingestion of radiolabeled meal, sequential abdominal images were obtained for 4 hours. Percentage of activity emptying the stomach calculated at 1 hour, 2 hour, 3 hour, and 4 hours.  RADIOPHARMACEUTICALS:  2 mCi Technetium 99-m labeled sulfur colloid into egg whites, 6 oz water, and 1 slice of toast  COMPARISON:  None.  FINDINGS: Expected location of the stomach in the left upper quadrant. Ingested meal empties the stomach gradually over the course of the study.  18% emptied at 1 hr ( normal >= 10%)  8% emptied at 2 hr ( normal >= 40%)  16% emptied at 3 hr ( normal >=  70%)  30% emptied at 4 hr ( normal >= 90%)  IMPRESSION: Delayed gastric emptying study.   Electronically Signed   By: David  Martinique   On: 02/08/2015 12:19   US Venous Img Lower Bilateral  02/05/2015   CLINICAL DATA:  History of right femur fracture, now with bilateral lower extremity pain. Evaluate for DVT.  EXAM: BILATERAL LOWER EXTREMITY VENOUS DOPPLER ULTRASOUND  TECHNIQUE: Gray-scale sonography with graded compression, as well as color Doppler and duplex ultrasound were performed to evaluate the lower extremity deep venous systems from the level of the common femoral vein and including the common femoral, femoral, profunda femoral, popliteal and calf veins including the posterior tibial, peroneal and gastrocnemius veins when visible. The superficial great saphenous vein was also interrogated. Spectral Doppler was utilized to evaluate flow at rest and with distal augmentation maneuvers in the common femoral, femoral and popliteal veins.  COMPARISON:  None.  FINDINGS: RIGHT LOWER EXTREMITY  Common Femoral Vein: No evidence of thrombus. Normal compressibility, respiratory phasicity and response to augmentation.  Saphenofemoral Junction: No evidence of thrombus. Normal compressibility and flow on color Doppler imaging.  Profunda Femoral Vein: No evidence of thrombus. Normal  compressibility and flow on color Doppler imaging.  Femoral Vein: No evidence of thrombus. Normal compressibility, respiratory phasicity and response to augmentation.  Popliteal Vein: No evidence of thrombus. Normal compressibility, respiratory phasicity and response to augmentation.  Calf Veins: No evidence of thrombus. Normal compressibility and flow on color Doppler imaging.  Superficial Great Saphenous Vein: No evidence of thrombus. Normal compressibility and flow on color Doppler imaging.  Venous Reflux:  None.  Other Findings:  None.  LEFT LOWER EXTREMITY  Common Femoral Vein: No evidence of thrombus. Normal compressibility, respiratory phasicity and response to augmentation.  Saphenofemoral Junction: No evidence of thrombus. Normal compressibility and flow on color Doppler imaging.  Profunda Femoral Vein: No evidence of thrombus. Normal compressibility and flow on color Doppler imaging.  Femoral Vein: No evidence of thrombus. Normal compressibility, respiratory phasicity and response to augmentation.  Popliteal Vein: No evidence of thrombus. Normal compressibility, respiratory phasicity and response to augmentation.  Calf Veins: No evidence of thrombus. Normal compressibility and flow on color Doppler imaging.  Superficial Great Saphenous Vein: No evidence of thrombus. Normal compressibility and flow on color Doppler imaging.  Venous Reflux:  None.  Other Findings:  None.  IMPRESSION: No evidence of DVT within either lower extremity.   Electronically Signed   By: Sandi Mariscal M.D.   On: 02/05/2015 17:34   Dg Chest Port 1 View  02/05/2015   CLINICAL DATA:  Shortness of breath. Recent femur fracture 3 weeks ago.  EXAM: PORTABLE CHEST - 1 VIEW  COMPARISON:  02/02/2015  FINDINGS: Shallow inspiration with atelectasis in the lung bases, increasing on the right side since previous study. This may represent developing infiltration. Mild cardiac enlargement with mild pulmonary vascular congestion. This is also  increasing. Blunting of the right costophrenic angle suggests small effusion. No pneumothorax  IMPRESSION: Increasing infiltration or atelectasis in the right lung base. Cardiac enlargement with increasing pulmonary vascular congestion. Small right pleural effusion.   Electronically Signed   By: Lucienne Capers M.D.   On: 02/05/2015 17:32   Dg Chest Port 1 View  02/02/2015   CLINICAL DATA:  Fever, hematuria.  EXAM: PORTABLE CHEST - 1 VIEW  COMPARISON:  01/24/2015.  FINDINGS: Trachea is midline. Heart size is grossly stable. Lungs are low in volume with mild bibasilar atelectasis. Difficult to exclude developing  airspace opacification in the left lower lobe. No definite pleural fluid.  IMPRESSION: Low lung volumes with bibasilar atelectasis. Difficult to exclude developing pneumonia in the left lower lobe.   Electronically Signed   By: Lorin Picket M.D.   On: 02/02/2015 15:09   Dg Chest Portable 1 View  01/17/2015   CLINICAL DATA:  Pre-OP Rt knee FX. No current chest complaints. Hx HTN, diabetes, CA, nonsmoker  EXAM: PORTABLE CHEST - 1 VIEW  COMPARISON:  11/15/2014  FINDINGS: Low lung volumes. There is also elevation the right hemidiaphragm. Mild thinning and lung base opacity noted, greater on the right, most likely atelectasis. No convincing pneumonia. No pulmonary edema. No pleural effusion or pneumothorax.  Cardiac silhouette is normal in size and configuration. Normal mediastinal and hilar contours.  Bony thorax is demineralized but intact.  IMPRESSION: No acute cardiopulmonary disease.   Electronically Signed   By: Lajean Manes M.D.   On: 01/17/2015 19:09   Dg Knee Complete 4 Views Right  01/17/2015   CLINICAL DATA:  Fall  EXAM: RIGHT KNEE - COMPLETE 4+ VIEW  COMPARISON:  None  FINDINGS: Osseous demineralization.  Oblique displaced fracture distal RIGHT femoral meta diaphysis, with distal fragment displaced medially and posteriorly.  Mild apex anterior angulation and overriding.  No articular  extension.  Knee joint alignment normal.  No additional fracture, dislocation, or bone destruction.  Scattered vascular calcifications.  Old healed fracture of the proximal RIGHT fibular diaphysis.  Tiny cassette artifact noted on all images.  IMPRESSION: Displaced and angulated distal RIGHT femoral metadiaphyseal fracture.   Electronically Signed   By: Lavonia Dana M.D.   On: 01/17/2015 17:55   Dg C-arm 1-60 Min  01/18/2015   CLINICAL DATA:  ORIF.  EXAM: DG FEMUR 2+V*R*; DG C-ARM 61-120 MIN  COMPARISON:  None.  FINDINGS: Patient status post open reduction internal fixation of distal right femoral fracture. Near anatomic alignment. Fluoro time 61.4 seconds.  IMPRESSION: ORIF distal right femoral fracture with near anatomic alignment.   Electronically Signed   By: Marcello Moores  Register   On: 01/18/2015 16:35   Dg Femur, Min 2 Views Right  01/18/2015   CLINICAL DATA:  ORIF.  EXAM: DG FEMUR 2+V*R*; DG C-ARM 61-120 MIN  COMPARISON:  None.  FINDINGS: Patient status post open reduction internal fixation of distal right femoral fracture. Near anatomic alignment. Fluoro time 61.4 seconds.  IMPRESSION: ORIF distal right femoral fracture with near anatomic alignment.   Electronically Signed   By: Marcello Moores  Register   On: 01/18/2015 16:35    Microbiology: Recent Results (from the past 240 hour(s))  Urine culture     Status: None   Collection Time: 02/02/15  2:43 PM  Result Value Ref Range Status   Specimen Description URINE, CATHETERIZED  Final   Special Requests NONE  Final   Colony Count   Final    >=100,000 COLONIES/ML Performed at [facility]    Culture   Final    ENTEROCOCCUS SPECIES YEAST Performed at [facility]    Report Status 02/06/2015 FINAL  Final   Organism ID, Bacteria ENTEROCOCCUS SPECIES  Final      Susceptibility   Enterococcus species - MIC*    AMPICILLIN <=2 SENSITIVE Sensitive     LEVOFLOXACIN 1 SENSITIVE Sensitive     NITROFURANTOIN <=16 SENSITIVE Sensitive      VANCOMYCIN 2 SENSITIVE Sensitive     TETRACYCLINE >=16 RESISTANT Resistant     * ENTEROCOCCUS SPECIES  MRSA PCR Screening  Status: Abnormal   Collection Time: 02/02/15  7:47 PM  Result Value Ref Range Status   MRSA by PCR POSITIVE (A) NEGATIVE Final    Comment: CRITICAL RESULT CALLED TO, READ BACK BY AND VERIFIED WITH: ROGERS,A AT 10:10PM ON 02/02/15 BY FESTERMAN,C        The GeneXpert MRSA Assay (FDA approved for NASAL specimens only), is one component of a comprehensive MRSA colonization surveillance program. It is not intended to diagnose MRSA infection nor to guide or monitor treatment for MRSA infections.   Clostridium Difficile by PCR     Status: None   Collection Time: 02/08/15 11:02 AM  Result Value Ref Range Status   C difficile by pcr NEGATIVE NEGATIVE Final     Labs: Basic Metabolic Panel:  Recent Labs Lab 02/06/15 0528 02/07/15 0621 02/07/15 1306 02/08/15 1226 02/09/15 0624  NA 139 138 139 140 143  K 5.2* 5.4* 5.2* 4.0 3.9  CL 112 111 114* 114* 115*  CO2 20 19 17* 19 20  GLUCOSE 72 84 113* 113* 49*  BUN 55* 47* 44* 43* 41*  CREATININE 2.78* 2.86* 2.78* 2.72* 2.59*  CALCIUM 8.3* 8.1* 8.2* 8.5 8.2*  PHOS  --   --   --   --  3.4   Liver Function Tests:  Recent Labs Lab 02/02/15 1458 02/09/15 0624  AST 16  --   ALT 9  --   ALKPHOS 103  --   BILITOT 0.5  --   PROT 7.8  --   ALBUMIN 3.1* 2.4*    Recent Labs Lab 02/02/15 1458  LIPASE 28   No results for input(s): AMMONIA in the last 168 hours. CBC:  Recent Labs Lab 02/02/15 1458 02/03/15 4034 02/04/15 1945 02/05/15 0632 02/07/15 0621 02/08/15 1226 02/09/15 0624  WBC 15.2* 11.1*  --  11.0* 13.0* 8.6 7.4  NEUTROABS 13.3*  --   --   --   --   --   --   HGB 8.9* 7.7* 7.6* 7.4* 8.9* 9.3* 8.2*  HCT 26.9* 24.3* 23.5* 23.0* 28.2* 28.2* 25.5*  MCV 88.2 89.3  --  90.2 91.0 89.2 88.9  PLT 252 240  --  247 235 232 226   Cardiac Enzymes:  Recent Labs Lab 02/02/15 1458  TROPONINI  <0.03   BNP: BNP (last 3 results) No results for input(s): BNP in the last 8760 hours.  ProBNP (last 3 results) No results for input(s): PROBNP in the last 8760 hours.  CBG:  Recent Labs Lab 02/08/15 1153 02/08/15 1706 02/08/15 2040 02/09/15 0745 02/09/15 0835  GLUCAP 110* 103* 104* 48* 76       Signed:  THOMPSON,DANIEL MD [name] 02/09/2015, 10:58 AM

## 2015-02-09 NOTE — Progress Notes (Signed)
Discharge instructions reviewed with pt and family. Medications explained and no current questions or concerns. Waiting for confirmation that the bed from [facility] has arrived at pt's residence before pt leaves. Dressing changed. Will be leaving shortly

## 2015-02-20 ENCOUNTER — Encounter (INDEPENDENT_AMBULATORY_CARE_PROVIDER_SITE_OTHER): Payer: Medicare Other | Admitting: Ophthalmology

## 2015-02-26 ENCOUNTER — Encounter (HOSPITAL_COMMUNITY): Payer: Self-pay

## 2015-02-26 ENCOUNTER — Emergency Department (HOSPITAL_COMMUNITY): Payer: Medicare Other

## 2015-02-26 ENCOUNTER — Inpatient Hospital Stay (HOSPITAL_COMMUNITY)
Admission: EM | Admit: 2015-02-26 | Discharge: 2015-03-09 | DRG: 871 | Disposition: A | Discharge status: Home Health | Payer: Medicare Other | Attending: Internal Medicine | Admitting: Internal Medicine

## 2015-02-26 DIAGNOSIS — N139 Obstructive and reflux uropathy, unspecified: Secondary | ICD-10-CM | POA: Diagnosis present

## 2015-02-26 DIAGNOSIS — N184 Chronic kidney disease, stage 4 (severe): Secondary | ICD-10-CM | POA: Diagnosis present

## 2015-02-26 DIAGNOSIS — R310 Gross hematuria: Secondary | ICD-10-CM | POA: Diagnosis not present

## 2015-02-26 DIAGNOSIS — R339 Retention of urine, unspecified: Secondary | ICD-10-CM | POA: Diagnosis not present

## 2015-02-26 DIAGNOSIS — A047 Enterocolitis due to Clostridium difficile: Secondary | ICD-10-CM | POA: Diagnosis not present

## 2015-02-26 DIAGNOSIS — R0902 Hypoxemia: Secondary | ICD-10-CM | POA: Diagnosis present

## 2015-02-26 DIAGNOSIS — A419 Sepsis, unspecified organism: Secondary | ICD-10-CM | POA: Diagnosis present

## 2015-02-26 DIAGNOSIS — E1143 Type 2 diabetes mellitus with diabetic autonomic (poly)neuropathy: Secondary | ICD-10-CM | POA: Diagnosis present

## 2015-02-26 DIAGNOSIS — T148XXA Other injury of unspecified body region, initial encounter: Secondary | ICD-10-CM

## 2015-02-26 DIAGNOSIS — Z87440 Personal history of urinary (tract) infections: Secondary | ICD-10-CM | POA: Diagnosis not present

## 2015-02-26 DIAGNOSIS — K3184 Gastroparesis: Secondary | ICD-10-CM | POA: Diagnosis present

## 2015-02-26 DIAGNOSIS — C775 Secondary and unspecified malignant neoplasm of intrapelvic lymph nodes: Secondary | ICD-10-CM | POA: Diagnosis present

## 2015-02-26 DIAGNOSIS — D638 Anemia in other chronic diseases classified elsewhere: Secondary | ICD-10-CM | POA: Diagnosis present

## 2015-02-26 DIAGNOSIS — Z794 Long term (current) use of insulin: Secondary | ICD-10-CM

## 2015-02-26 DIAGNOSIS — E860 Dehydration: Secondary | ICD-10-CM | POA: Diagnosis present

## 2015-02-26 DIAGNOSIS — Z8546 Personal history of malignant neoplasm of prostate: Secondary | ICD-10-CM | POA: Diagnosis not present

## 2015-02-26 DIAGNOSIS — M25569 Pain in unspecified knee: Secondary | ICD-10-CM

## 2015-02-26 DIAGNOSIS — I129 Hypertensive chronic kidney disease with stage 1 through stage 4 chronic kidney disease, or unspecified chronic kidney disease: Secondary | ICD-10-CM | POA: Diagnosis present

## 2015-02-26 DIAGNOSIS — M25561 Pain in right knee: Secondary | ICD-10-CM | POA: Diagnosis present

## 2015-02-26 DIAGNOSIS — E119 Type 2 diabetes mellitus without complications: Secondary | ICD-10-CM

## 2015-02-26 DIAGNOSIS — E785 Hyperlipidemia, unspecified: Secondary | ICD-10-CM | POA: Diagnosis present

## 2015-02-26 DIAGNOSIS — G9341 Metabolic encephalopathy: Secondary | ICD-10-CM | POA: Diagnosis present

## 2015-02-26 DIAGNOSIS — K219 Gastro-esophageal reflux disease without esophagitis: Secondary | ICD-10-CM | POA: Diagnosis present

## 2015-02-26 DIAGNOSIS — R4182 Altered mental status, unspecified: Secondary | ICD-10-CM | POA: Diagnosis present

## 2015-02-26 DIAGNOSIS — R0602 Shortness of breath: Secondary | ICD-10-CM

## 2015-02-26 DIAGNOSIS — G9340 Encephalopathy, unspecified: Secondary | ICD-10-CM

## 2015-02-26 DIAGNOSIS — R112 Nausea with vomiting, unspecified: Secondary | ICD-10-CM | POA: Diagnosis not present

## 2015-02-26 DIAGNOSIS — H905 Unspecified sensorineural hearing loss: Secondary | ICD-10-CM | POA: Diagnosis present

## 2015-02-26 DIAGNOSIS — Z923 Personal history of irradiation: Secondary | ICD-10-CM | POA: Diagnosis not present

## 2015-02-26 DIAGNOSIS — N390 Urinary tract infection, site not specified: Secondary | ICD-10-CM | POA: Diagnosis present

## 2015-02-26 DIAGNOSIS — R609 Edema, unspecified: Secondary | ICD-10-CM

## 2015-02-26 DIAGNOSIS — N179 Acute kidney failure, unspecified: Secondary | ICD-10-CM | POA: Diagnosis present

## 2015-02-26 DIAGNOSIS — R000 Tachycardia, unspecified: Secondary | ICD-10-CM | POA: Diagnosis present

## 2015-02-26 DIAGNOSIS — A0472 Enterocolitis due to Clostridium difficile, not specified as recurrent: Secondary | ICD-10-CM | POA: Diagnosis not present

## 2015-02-26 LAB — CBC WITH DIFFERENTIAL/PLATELET
Basophils Absolute: 0.1 10*3/uL (ref 0.0–0.1)
Basophils Relative: 1 % (ref 0–1)
Eosinophils Absolute: 0.3 10*3/uL (ref 0.0–0.7)
Eosinophils Relative: 3 % (ref 0–5)
HCT: 29.5 % — ABNORMAL LOW (ref 39.0–52.0)
Hemoglobin: 9.6 g/dL — ABNORMAL LOW (ref 13.0–17.0)
Lymphocytes Relative: 7 % — ABNORMAL LOW (ref 12–46)
Lymphs Abs: 0.7 10*3/uL (ref 0.7–4.0)
MCH: 29.1 pg (ref 26.0–34.0)
MCHC: 32.5 g/dL (ref 30.0–36.0)
MCV: 89.4 fL (ref 78.0–100.0)
MONO ABS: 0.5 10*3/uL (ref 0.1–1.0)
Monocytes Relative: 5 % (ref 3–12)
NEUTROS PCT: 84 % — AB (ref 43–77)
Neutro Abs: 8 10*3/uL — ABNORMAL HIGH (ref 1.7–7.7)
Platelets: 146 10*3/uL — ABNORMAL LOW (ref 150–400)
RBC: 3.3 MIL/uL — ABNORMAL LOW (ref 4.22–5.81)
RDW: 14 % (ref 11.5–15.5)
WBC: 9.5 10*3/uL (ref 4.0–10.5)

## 2015-02-26 LAB — URINALYSIS, ROUTINE W REFLEX MICROSCOPIC
Glucose, UA: 100 mg/dL — AB
Ketones, ur: NEGATIVE mg/dL
NITRITE: POSITIVE — AB
SPECIFIC GRAVITY, URINE: 1.025 (ref 1.005–1.030)
UROBILINOGEN UA: 4 mg/dL — AB (ref 0.0–1.0)
pH: 5 (ref 5.0–8.0)

## 2015-02-26 LAB — BLOOD GAS, ARTERIAL
ACID-BASE DEFICIT: 7.3 mmol/L — AB (ref 0.0–2.0)
Bicarbonate: 17.5 mEq/L — ABNORMAL LOW (ref 20.0–24.0)
DRAWN BY: 25788
O2 Saturation: 89.3 %
PATIENT TEMPERATURE: 37
TCO2: 16.3 mmol/L (ref 0–100)
pCO2 arterial: 34.1 mmHg — ABNORMAL LOW (ref 35.0–45.0)
pH, Arterial: 7.331 — ABNORMAL LOW (ref 7.350–7.450)
pO2, Arterial: 58.9 mmHg — ABNORMAL LOW (ref 80.0–100.0)

## 2015-02-26 LAB — COMPREHENSIVE METABOLIC PANEL
ALT: 11 U/L (ref 0–53)
AST: 19 U/L (ref 0–37)
Albumin: 3.4 g/dL — ABNORMAL LOW (ref 3.5–5.2)
Alkaline Phosphatase: 104 U/L (ref 39–117)
Anion gap: 13 (ref 5–15)
BILIRUBIN TOTAL: 0.4 mg/dL (ref 0.3–1.2)
BUN: 50 mg/dL — ABNORMAL HIGH (ref 6–23)
CALCIUM: 8.9 mg/dL (ref 8.4–10.5)
CO2: 18 mmol/L — ABNORMAL LOW (ref 19–32)
CREATININE: 3.03 mg/dL — AB (ref 0.50–1.35)
Chloride: 106 mmol/L (ref 96–112)
GFR calc Af Amer: 21 mL/min — ABNORMAL LOW (ref >90)
GFR calc non Af Amer: 18 mL/min — ABNORMAL LOW (ref >90)
GLUCOSE: 264 mg/dL — AB (ref 70–99)
Potassium: 4.9 mmol/L (ref 3.5–5.1)
SODIUM: 137 mmol/L (ref 135–145)
Total Protein: 7.4 g/dL (ref 6.0–8.3)

## 2015-02-26 LAB — GLUCOSE, CAPILLARY: Glucose-Capillary: 206 mg/dL — ABNORMAL HIGH (ref 70–99)

## 2015-02-26 LAB — URINE MICROSCOPIC-ADD ON

## 2015-02-26 LAB — I-STAT CG4 LACTIC ACID, ED
Lactic Acid, Venous: 2.55 mmol/L (ref 0.5–2.0)
Lactic Acid, Venous: 1.63 mmol/L (ref 0.5–2.0)

## 2015-02-26 LAB — BRAIN NATRIURETIC PEPTIDE: B NATRIURETIC PEPTIDE 5: 187 pg/mL — AB (ref 0.0–100.0)

## 2015-02-26 LAB — TROPONIN I: Troponin I: <0.03 ng/mL (ref <0.031)

## 2015-02-26 MED ORDER — OXYBUTYNIN CHLORIDE 5 MG PO TABS
5.0000 mg | ORAL_TABLET | Freq: Three times a day (TID) | ORAL | Status: DC | PRN
  Administered 2015-02-26 – 2015-03-05 (×3): 5 mg via ORAL
  Filled 2015-02-26 (×3): qty 1

## 2015-02-26 MED ORDER — ACETAMINOPHEN 325 MG PO TABS
ORAL_TABLET | ORAL | Status: AC
  Administered 2015-02-26: 650 mg via ORAL
  Filled 2015-02-26: qty 2

## 2015-02-26 MED ORDER — SODIUM CHLORIDE 0.9 % IV BOLUS (SEPSIS)
1000.0000 mL | INTRAVENOUS | Status: AC
  Administered 2015-02-26 (×2): 1000 mL via INTRAVENOUS

## 2015-02-26 MED ORDER — ACETAMINOPHEN 325 MG PO TABS
650.0000 mg | ORAL_TABLET | Freq: Four times a day (QID) | ORAL | Status: DC | PRN
  Administered 2015-02-26: 650 mg via ORAL

## 2015-02-26 MED ORDER — PANTOPRAZOLE SODIUM 40 MG PO TBEC
40.0000 mg | DELAYED_RELEASE_TABLET | Freq: Every day | ORAL | Status: DC
  Administered 2015-02-26 – 2015-03-05 (×7): 40 mg via ORAL
  Filled 2015-02-26 (×8): qty 1

## 2015-02-26 MED ORDER — METOCLOPRAMIDE HCL 10 MG PO TABS
5.0000 mg | ORAL_TABLET | Freq: Three times a day (TID) | ORAL | Status: DC
Start: 2015-02-26 — End: 2015-02-27
  Administered 2015-02-26 – 2015-02-27 (×2): 5 mg via ORAL
  Filled 2015-02-26 (×2): qty 1

## 2015-02-26 MED ORDER — KETOROLAC TROMETHAMINE 0.5 % OP SOLN
1.0000 [drp] | Freq: Three times a day (TID) | OPHTHALMIC | Status: DC
  Administered 2015-02-26 – 2015-03-09 (×30): 1 [drp] via OPHTHALMIC
  Filled 2015-02-26: qty 3
  Filled 2015-02-26: qty 5

## 2015-02-26 MED ORDER — GABAPENTIN 300 MG PO CAPS
300.0000 mg | ORAL_CAPSULE | Freq: Every day | ORAL | Status: DC
  Administered 2015-02-26 – 2015-03-08 (×11): 300 mg via ORAL
  Filled 2015-02-26 (×13): qty 1

## 2015-02-26 MED ORDER — SODIUM CHLORIDE 0.9 % IV SOLN: INTRAVENOUS | Status: AC

## 2015-02-26 MED ORDER — ENOXAPARIN SODIUM 30 MG/0.3ML ~~LOC~~ SOLN
30.0000 mg | SUBCUTANEOUS | Status: DC
  Administered 2015-02-26 – 2015-03-07 (×9): 30 mg via SUBCUTANEOUS
  Filled 2015-02-26 (×9): qty 0.3

## 2015-02-26 MED ORDER — ACETAMINOPHEN 650 MG RE SUPP
650.0000 mg | RECTAL | Status: AC
  Administered 2015-02-26: 650 mg via RECTAL
  Filled 2015-02-26: qty 1

## 2015-02-26 MED ORDER — CEFEPIME HCL 1 G IJ SOLR
1.0000 g | INTRAMUSCULAR | Status: DC
  Administered 2015-02-27 – 2015-03-04 (×6): 1 g via INTRAVENOUS
  Filled 2015-02-26 (×6): qty 1

## 2015-02-26 MED ORDER — INSULIN ASPART 100 UNIT/ML ~~LOC~~ SOLN
0.0000 [IU] | SUBCUTANEOUS | Status: DC
  Administered 2015-02-26: 3 [IU] via SUBCUTANEOUS
  Administered 2015-02-27: 2 [IU] via SUBCUTANEOUS
  Administered 2015-02-27: 1 [IU] via SUBCUTANEOUS

## 2015-02-26 MED ORDER — ONDANSETRON HCL 4 MG/2ML IJ SOLN
4.0000 mg | Freq: Four times a day (QID) | INTRAMUSCULAR | Status: DC | PRN
  Administered 2015-02-27: 4 mg via INTRAVENOUS
  Filled 2015-02-26: qty 2

## 2015-02-26 MED ORDER — CEFEPIME HCL 2 G IJ SOLR
2.0000 g | Freq: Once | INTRAMUSCULAR | Status: AC
  Administered 2015-02-26: 2 g via INTRAVENOUS
  Filled 2015-02-26: qty 2

## 2015-02-26 MED ORDER — ONDANSETRON HCL 4 MG PO TABS: 4.0000 mg | ORAL_TABLET | Freq: Four times a day (QID) | ORAL | Status: DC | PRN

## 2015-02-26 MED ORDER — ONDANSETRON HCL 4 MG/2ML IJ SOLN
4.0000 mg | Freq: Once | INTRAMUSCULAR | Status: AC
  Administered 2015-02-26: 4 mg via INTRAVENOUS
  Filled 2015-02-26: qty 2

## 2015-02-26 MED ORDER — TAMSULOSIN HCL 0.4 MG PO CAPS
0.4000 mg | ORAL_CAPSULE | Freq: Every day | ORAL | Status: DC
  Administered 2015-02-26 – 2015-03-09 (×11): 0.4 mg via ORAL
  Filled 2015-02-26 (×12): qty 1

## 2015-02-26 NOTE — ED Provider Notes (Signed)
CSN: [id number]     Arrival date & time 02/26/15  1714 History   First MD Initiated Contact with Patient 02/26/15 1715     Chief Complaint  Patient presents with  . Emesis  . Altered Mental Status     (Consider location/radiation/quality/duration/timing/severity/associated sxs/prior Treatment) HPI Comments: Patient brought to the emergency department from home by ambulance. Patient was diagnosed with urinary tract infection yesterday. Home health nursing initiated antibiotic therapy. Today, however, patient has become weak, febrile. He has been having Reiter's and confusion. Patient has had multiple episodes of vomiting. He is brought to the ER by ambulance. EMS report that he had a temp of 101.3 at home. He has been given Zofran for the vomiting. They have noticed decreased oxygen saturations, approximately 90%. He has been placed on supplemental oxygen with improvement. At arrival to the ER, patient is without complaints.  Patient is a 81 y.o. male presenting with vomiting and altered mental status.  Emesis Associated symptoms: chills   Altered Mental Status Presenting symptoms: confusion   Associated symptoms: fever, nausea, vomiting and weakness     Past Medical History  Diagnosis Date  . Hypertension   . Diabetes mellitus   . Hyperlipidemia   . Cataracts, bilateral   . Hyperkalemia   . UTI (lower urinary tract infection)   . Calculus of kidney   . Sensorineural hearing loss, unspecified   . Epiglottitis   . Chronic ankle pain   . Neuromuscular disorder     neurophaty lower legs  . Upper GI bleeding     while at [facility] 12/2014  . Prostate cancer 2009    radiation  . Confusion   . Gastroparesis due to DM: DELAYED GASTRIC EMPTYING STUDY 02/08/15 02/08/2015   Past Surgical History  Procedure Laterality Date  . Eye surgery  sept. 2015    cataract surgery  . Hernia repair      severa; years can't remeber  . Femur im nail Right 01/18/2015    Procedure: INTRAMEDULLARY (IM)  RETROGRADE FEMORAL NAILING;  Surgeon: Renette Butters, MD;  Location: [place];  Service: Orthopedics;  Laterality: Right;   No family history on file. History  Substance Use Topics  . Smoking status: Never Smoker   . Smokeless tobacco: Not on file  . Alcohol Use: No    Review of Systems  Constitutional: Positive for fever and chills.  Gastrointestinal: Positive for nausea and vomiting.  Neurological: Positive for weakness.  Psychiatric/Behavioral: Positive for confusion.  All other systems reviewed and are negative.     Allergies  Review of patient's allergies indicates no known allergies.  Home Medications   Prior to Admission medications   Medication Sig Start Date End Date Taking? Authorizing Provider  acetaminophen (TYLENOL) 500 MG tablet Take 500 mg by mouth every 6 (six) hours as needed for mild pain or moderate pain.   Yes Historical Provider, MD  amLODipine (NORVASC) 10 MG tablet Take 10 mg by mouth daily.   Yes Historical Provider, MD  atorvastatin (LIPITOR) 40 MG tablet Take 40 mg by mouth daily.   Yes Historical Provider, MD  ciprofloxacin (CIPRO) 250 MG tablet Take 250 mg by mouth once.   Yes Historical Provider, MD  gabapentin (NEURONTIN) 300 MG capsule Take 1 capsule (300 mg total) by mouth at bedtime. Patient taking differently: Take 600 mg by mouth at bedtime.  01/21/15  Yes Shanker Kristeen Mans, MD  insulin aspart (NOVOLOG) 100 UNIT/ML injection insulin aspart (novoLOG) injection 0-15 Units 0-15 Units,  Subcutaneous, 3 times daily with meals CBG 70 - 120: 0 units CBG 121 - 150: 2 units CBG 151 - 200: 3 units CBG 201 - 250: 5 units CBG 251 - 300: 8 units CBG 301 - 350: 11 units CBG 351 - 400: 15 units CBG > 400: call MD 01/21/15  Yes Shanker Kristeen Mans, MD  insulin detemir (LEVEMIR) 100 UNIT/ML injection Inject 0.1 mLs (10 Units total) into the skin at bedtime. 02/09/15  Yes Eugenie Filler, MD  iron polysaccharides (NIFEREX) 150 MG capsule Take 1 capsule (150 mg  total) by mouth 2 (two) times daily. 02/09/15  Yes Eugenie Filler, MD  ketorolac (ACULAR) 0.5 % ophthalmic solution Place 1 drop into both eyes 3 (three) times daily.   Yes Historical Provider, MD  metoCLOPramide (REGLAN) 5 MG tablet Take 1 tablet (5 mg total) by mouth 4 (four) times daily -  before meals and at bedtime. 02/09/15  Yes Eugenie Filler, MD  oxybutynin (DITROPAN) 5 MG tablet Take 1 tablet (5 mg total) by mouth every 8 (eight) hours as needed for bladder spasms. 02/09/15  Yes Eugenie Filler, MD  phenazopyridine (PYRIDIUM) 200 MG tablet Take 200 mg by mouth 3 (three) times daily as needed for pain.   Yes Historical Provider, MD  polyethylene glycol (MIRALAX / GLYCOLAX) packet Take 17 g by mouth 2 (two) times daily as needed for moderate constipation. Hold if develops diarrhea. 02/09/15  Yes Eugenie Filler, MD  senna-docusate (SENOKOT-S) 8.6-50 MG per tablet Take 2 tablets by mouth at bedtime. Hold if develops diarrhea. 02/09/15  Yes Eugenie Filler, MD  tamsulosin (FLOMAX) 0.4 MG CAPS capsule Take 1 capsule (0.4 mg total) by mouth daily. 02/09/15  Yes Eugenie Filler, MD  traMADol (ULTRAM) 50 MG tablet Take 2 tablets (100 mg total) by mouth every 6 (six) hours as needed for severe pain. 02/09/15  Yes Eugenie Filler, MD  docusate sodium (COLACE) 100 MG capsule Take 1 capsule (100 mg total) by mouth daily as needed for mild constipation. Patient not taking: Reported on 02/26/2015 02/09/15   Eugenie Filler, MD  pantoprazole (PROTONIX) 40 MG tablet Take 1 tablet (40 mg total) by mouth daily. 02/09/15   Eugenie Filler, MD   BP 115/61 mmHg  Pulse 112  Temp(Src) 100.9 F (38.3 C) (Oral)  Resp 26  Ht 5\' 5"  (1.651 m)  Wt 140 lb (63.504 kg)  BMI 23.30 kg/m2  SpO2 88% Physical Exam  Constitutional: He is oriented to person, place, and time. He appears well-developed and well-nourished. No distress.  HENT:  Head: Normocephalic and atraumatic.  Right Ear: Hearing normal.  Left  Ear: Hearing normal.  Nose: Nose normal.  Mouth/Throat: Oropharynx is clear and moist and mucous membranes are normal.  Eyes: Conjunctivae and EOM are normal. Pupils are equal, round, and reactive to light.  Neck: Normal range of motion. Neck supple.  Cardiovascular: Regular rhythm, S1 normal and S2 normal.  Tachycardia present.  Exam reveals no gallop and no friction rub.   No murmur heard. Pulmonary/Chest: Effort normal and breath sounds normal. No respiratory distress. He exhibits no tenderness.  Abdominal: Soft. Normal appearance and bowel sounds are normal. There is no hepatosplenomegaly. There is no tenderness. There is no rebound, no guarding, no tenderness at McBurney's point and negative Murphy's sign. No hernia.  Musculoskeletal: Normal range of motion.  Neurological: He is alert and oriented to person, place, and time. He has normal strength. No cranial  nerve deficit or sensory deficit. Coordination normal. GCS eye subscore is 4. GCS verbal subscore is 5. GCS motor subscore is 6.  Skin: Skin is warm, dry and intact. No rash noted. No cyanosis.  Psychiatric: He has a normal mood and affect. His speech is normal and behavior is normal. Thought content normal.  Nursing note and vitals reviewed.   ED Course  Procedures (including critical care time) Labs Review Labs Reviewed  CBC WITH DIFFERENTIAL/PLATELET - Abnormal; Notable for the following:    RBC 3.30 (*)    Hemoglobin 9.6 (*)    HCT 29.5 (*)    Platelets 146 (*)    Neutrophils Relative % 84 (*)    Neutro Abs 8.0 (*)    Lymphocytes Relative 7 (*)    All other components within normal limits  COMPREHENSIVE METABOLIC PANEL - Abnormal; Notable for the following:    CO2 18 (*)    Glucose, Bld 264 (*)    BUN 50 (*)    Creatinine, Ser 3.03 (*)    Albumin 3.4 (*)    GFR calc non Af Amer 18 (*)    GFR calc Af Amer 21 (*)    All other components within normal limits  URINALYSIS, ROUTINE W REFLEX MICROSCOPIC - Abnormal;  Notable for the following:    Color, Urine AMBER (*)    APPearance HAZY (*)    Glucose, UA 100 (*)    Hgb urine dipstick LARGE (*)    Bilirubin Urine MODERATE (*)    Protein, ur >300 (*)    Urobilinogen, UA 4.0 (*)    Nitrite POSITIVE (*)    Leukocytes, UA MODERATE (*)    All other components within normal limits  BLOOD GAS, ARTERIAL - Abnormal; Notable for the following:    pH, Arterial 7.331 (*)    pCO2 arterial 34.1 (*)    pO2, Arterial 58.9 (*)    Bicarbonate 17.5 (*)    Acid-base deficit 7.3 (*)    All other components within normal limits  BRAIN NATRIURETIC PEPTIDE - Abnormal; Notable for the following:    B Natriuretic Peptide 187.0 (*)    All other components within normal limits  URINE MICROSCOPIC-ADD ON - Abnormal; Notable for the following:    Squamous Epithelial / LPF FEW (*)    Bacteria, UA FEW (*)    All other components within normal limits  I-STAT CG4 LACTIC ACID, ED - Abnormal; Notable for the following:    Lactic Acid, Venous 2.55 (*)    All other components within normal limits  CULTURE, BLOOD (ROUTINE X 2)  CULTURE, BLOOD (ROUTINE X 2)  URINE CULTURE  TROPONIN I  I-STAT CG4 LACTIC ACID, ED  I-STAT CG4 LACTIC ACID, ED    Imaging Review Dg Chest Port 1 View  02/26/2015   CLINICAL DATA:  Altered mental status and emesis.  EXAM: PORTABLE CHEST - 1 VIEW  COMPARISON:  02/05/2015  FINDINGS: Chronic elevation of the right hemidiaphragm. Linear densities at lung bases are suggestive for atelectasis. Upper lungs are clear. Heart size is normal. No focal airspace disease.  IMPRESSION: Bibasilar atelectasis.   Electronically Signed   By: Markus Daft M.D.   On: 02/26/2015 17:44     EKG Interpretation   Date/Time:  Tuesday February 26 2015 17:26:14 EST Ventricular Rate:  129 PR Interval:  173 QRS Duration: 71 QT Interval:  321 QTC Calculation: 470 R Axis:   78 Text Interpretation:  Sinus tachycardia Repolarization abnormality, prob  rate related  Confirmed by  [facility]  MD, [place] [phone]) on 02/26/2015  5:32:59 PM      MDM   Final diagnoses:  None   sepsis  UTI  Patient presents to the ER for evaluation of fever, chills, rigors and mild contusion. Patient reportedly had a urinalysis taken by his home health nurse. It showed evidence of urinary tract infection. Family reports that there was some delay in getting the prescription for the urinary tract infection. He got his first dose of Cipro this afternoon, but shortly afterwards started to develop nausea, vomiting, fever and chills. Presentation to the ER revealed evidence of sepsis with fever, tachycardia, mild hypoxia. He was initiated on cefepime. Blood and urine cultures are pending. Remainder of his workup was unremarkable. He has a sinus tachycardia secondary to fever and sepsis, but no evidence of arrhythmia. Note no elevation of troponin. Lactate only slightly elevated. Patient will be admitted to the hospital for management of sepsis secondary to UTI.    Orpah Greek, MD 02/26/15 2016

## 2015-02-26 NOTE — ED Notes (Signed)
EMS reports home health started him on an antibiotic and pyridium for UTI.  Today per ems pt more confused than usual, fever, and chills. CBG 177.  EMS administered 4mg  zofran IV and 228ml bolus of nss.

## 2015-02-26 NOTE — H&P (Signed)
PCP:   [name]., MD   Chief Complaint:  Fever, ams  HPI: 81 yo male with recent hip injury, hospitalization last month with foley since then diagnosed with uti yesterday started on cipro.  Progressively worsening today with ams, high fever, shaking all over and vomiting nonbloody.  Pt complains of no pain.  He lives at home with wife.  Previous urine cultures over the last couple of months reviewed, cipro appropriate.  Repeat culture data sent in ED.  Pt has been nauseas but no vomiting in the ED.  Has received 2 liters of ivf, and sepsis protocol.  Temp is still over 103.  Unknown when last time catheter replaced.  Suppose to have urology appt tomorrow for f/u of the indwelling catheter for urinary retention.  Review of Systems:  Unobtainable  Past Medical History: Past Medical History  Diagnosis Date  . Hypertension   . Diabetes mellitus   . Hyperlipidemia   . Cataracts, bilateral   . Hyperkalemia   . UTI (lower urinary tract infection)   . Calculus of kidney   . Sensorineural hearing loss, unspecified   . Epiglottitis   . Chronic ankle pain   . Neuromuscular disorder     neurophaty lower legs  . Upper GI bleeding     while at [facility] 12/2014  . Prostate cancer 2009    radiation  . Confusion   . Gastroparesis due to DM: DELAYED GASTRIC EMPTYING STUDY 02/08/15 02/08/2015   Past Surgical History  Procedure Laterality Date  . Eye surgery  sept. 2015    cataract surgery  . Hernia repair      severa; years can't remeber  . Femur im nail Right 01/18/2015    Procedure: INTRAMEDULLARY (IM) RETROGRADE FEMORAL NAILING;  Surgeon: Renette Butters, MD;  Location: [place];  Service: Orthopedics;  Laterality: Right;    Medications: Prior to Admission medications   Medication Sig Start Date End Date Taking? Authorizing Provider  acetaminophen (TYLENOL) 500 MG tablet Take 500 mg by mouth every 6 (six) hours as needed for mild pain or moderate pain.   Yes Historical Provider, MD   amLODipine (NORVASC) 10 MG tablet Take 10 mg by mouth daily.   Yes Historical Provider, MD  atorvastatin (LIPITOR) 40 MG tablet Take 40 mg by mouth daily.   Yes Historical Provider, MD  ciprofloxacin (CIPRO) 250 MG tablet Take 250 mg by mouth once.   Yes Historical Provider, MD  gabapentin (NEURONTIN) 300 MG capsule Take 1 capsule (300 mg total) by mouth at bedtime. Patient taking differently: Take 600 mg by mouth at bedtime.  01/21/15  Yes Shanker Kristeen Mans, MD  insulin aspart (NOVOLOG) 100 UNIT/ML injection insulin aspart (novoLOG) injection 0-15 Units 0-15 Units, Subcutaneous, 3 times daily with meals CBG 70 - 120: 0 units CBG 121 - 150: 2 units CBG 151 - 200: 3 units CBG 201 - 250: 5 units CBG 251 - 300: 8 units CBG 301 - 350: 11 units CBG 351 - 400: 15 units CBG > 400: call MD 01/21/15  Yes Shanker Kristeen Mans, MD  insulin detemir (LEVEMIR) 100 UNIT/ML injection Inject 0.1 mLs (10 Units total) into the skin at bedtime. 02/09/15  Yes Eugenie Filler, MD  iron polysaccharides (NIFEREX) 150 MG capsule Take 1 capsule (150 mg total) by mouth 2 (two) times daily. 02/09/15  Yes Eugenie Filler, MD  ketorolac (ACULAR) 0.5 % ophthalmic solution Place 1 drop into both eyes 3 (three) times daily.   Yes  Historical Provider, MD  metoCLOPramide (REGLAN) 5 MG tablet Take 1 tablet (5 mg total) by mouth 4 (four) times daily -  before meals and at bedtime. 02/09/15  Yes Eugenie Filler, MD  oxybutynin (DITROPAN) 5 MG tablet Take 1 tablet (5 mg total) by mouth every 8 (eight) hours as needed for bladder spasms. 02/09/15  Yes Eugenie Filler, MD  polyethylene glycol [facility] / GLYCOLAX) packet Take 17 g by mouth 2 (two) times daily as needed for moderate constipation. Hold if develops diarrhea. 02/09/15  Yes Eugenie Filler, MD  senna-docusate (SENOKOT-S) 8.6-50 MG per tablet Take 2 tablets by mouth at bedtime. Hold if develops diarrhea. 02/09/15  Yes Eugenie Filler, MD  tamsulosin (FLOMAX) 0.4 MG CAPS  capsule Take 1 capsule (0.4 mg total) by mouth daily. 02/09/15  Yes Eugenie Filler, MD  traMADol (ULTRAM) 50 MG tablet Take 2 tablets (100 mg total) by mouth every 6 (six) hours as needed for severe pain. 02/09/15  Yes Eugenie Filler, MD  docusate sodium (COLACE) 100 MG capsule Take 1 capsule (100 mg total) by mouth daily as needed for mild constipation. Patient not taking: Reported on 02/26/2015 02/09/15   Eugenie Filler, MD  pantoprazole (PROTONIX) 40 MG tablet Take 1 tablet (40 mg total) by mouth daily. 02/09/15   Eugenie Filler, MD    Allergies:  No Known Allergies  Social History:  reports that he has never smoked. He does not have any smokeless tobacco history on file. He reports that he does not drink alcohol or use illicit drugs.  Family History: none  Physical Exam: Filed Vitals:   02/26/15 1800 02/26/15 1838 02/26/15 1845 02/26/15 1900  BP: 141/82 123/49 123/49 119/76  Pulse: 130 123 120 119  Temp:   102.1 F (38.9 C)   TempSrc:   Oral   Resp: 30 22 22 22   Height:      Weight:      SpO2: 94% 96% 92% 95%   General appearance: alert, cooperative and mild distress Head: Normocephalic, without obvious abnormality, atraumatic Eyes: negative Nose: Nares normal. Septum midline. Mucosa normal. No drainage or sinus tenderness. Neck: no JVD and supple, symmetrical, trachea midline Lungs: clear to auscultation bilaterally Heart: regular rate and rhythm tachycardic Abdomen: soft, non-tender; bowel sounds normal; no masses,  no organomegaly Extremities: extremities normal, atraumatic, no cyanosis or edema Pulses: 2+ and symmetric Skin: Skin color, texture, turgor normal. No rashes or lesions Neurologic: Grossly normal    Labs on Admission:   Recent Labs  02/26/15 1725  NA 137  K 4.9  CL 106  CO2 18*  GLUCOSE 264*  BUN 50*  CREATININE 3.03*  CALCIUM 8.9    Recent Labs  02/26/15 1725  AST 19  ALT 11  ALKPHOS 104  BILITOT 0.4  PROT 7.4  ALBUMIN 3.4*     Recent Labs  02/26/15 1725  WBC 9.5  NEUTROABS 8.0*  HGB 9.6*  HCT 29.5*  MCV 89.4  PLT 146*    Recent Labs  02/26/15 1725  TROPONINI <0.03   Radiological Exams on Admission: cxr neg infiltrate or edema  Assessment/Plan  81 yo male with sepsis from complicated uti/catheter  Principal Problem:   Sepsis-  Source urine.  Cefepime iv given and to be continued.  Blood and urine cultures done.  Mildly elevated lactic acid level, repeat in couple of hours.  bp stable.  Tachycardic due to fever.  Give apap.  Close monitoring in the stepdown  unit.  Active Problems:   Tachycardia-  As above.  Treat with tylenol.   Diabetes-  Hold home insulin.   ssi sensitive here.   CKD (chronic kidney disease) stage 4, GFR 15-29 ml/min-  Around baseline.  Monitor.     Anemia, chronic disease-  stable   Encephalopathy-  Due to infection/fever.  freq neuro checks overnight.  No focal neuro deficits.   Urinary tract infectious disease-  As above, cefepime.  Follow culture data.  Urology appt will have to be rescheduled, as he will miss appt tomorrow.  Change out foley.   Emesis-  Prn zofran.  abd exam is benign.  lfts nml.   Gastroparesis due to DM: DELAYED GASTRIC EMPTYING STUDY 02/08/15-  Cont home reglan.  Admit to stepdown.  Full code.    Alette Kataoka A 02/26/2015, 7:16 PM

## 2015-02-26 NOTE — Progress Notes (Signed)
ANTIBIOTIC CONSULT NOTE - INITIAL  Pharmacy Consult for Cefepime Indication: Urosepsis  No Known Allergies  Patient Measurements: Height: 5\' 5"  (165.1 cm) Weight: 140 lb (63.504 kg) IBW/kg (Calculated) : 61.5 Adjusted Body Weight:   Vital Signs: Temp: 100.4 F (38 C) (03/01 1715) Temp Source: Oral (03/01 1715) BP: 137/107 mmHg (03/01 1715) Pulse Rate: 123 (03/01 1715) Intake/Output from previous day:   Intake/Output from this shift:    Labs: No results for input(s): WBC, HGB, PLT, LABCREA, CREATININE in the last 72 hours. Estimated Creatinine Clearance: 19.5 mL/min (by C-G formula based on Cr of 2.59). No results for input(s): VANCOTROUGH, VANCOPEAK, VANCORANDOM, GENTTROUGH, GENTPEAK, GENTRANDOM, TOBRATROUGH, TOBRAPEAK, TOBRARND, AMIKACINPEAK, AMIKACINTROU, AMIKACIN in the last 72 hours.   Microbiology: Recent Results (from the past 720 hour(s))  Urine culture     Status: None   Collection Time: 02/02/15  2:43 PM  Result Value Ref Range Status   Specimen Description URINE, CATHETERIZED  Final   Special Requests NONE  Final   Colony Count   Final    >=100,000 COLONIES/ML Performed at [facility]    Culture   Final    ENTEROCOCCUS SPECIES YEAST Performed at [facility]    Report Status 02/06/2015 FINAL  Final   Organism ID, Bacteria ENTEROCOCCUS SPECIES  Final      Susceptibility   Enterococcus species - MIC*    AMPICILLIN <=2 SENSITIVE Sensitive     LEVOFLOXACIN 1 SENSITIVE Sensitive     NITROFURANTOIN <=16 SENSITIVE Sensitive     VANCOMYCIN 2 SENSITIVE Sensitive     TETRACYCLINE >=16 RESISTANT Resistant     * ENTEROCOCCUS SPECIES  MRSA PCR Screening     Status: Abnormal   Collection Time: 02/02/15  7:47 PM  Result Value Ref Range Status   MRSA by PCR POSITIVE (A) NEGATIVE Final    Comment: CRITICAL RESULT CALLED TO, READ BACK BY AND VERIFIED WITH: ROGERS,A AT 10:10PM ON 02/02/15 BY FESTERMAN,C        The GeneXpert MRSA Assay  (FDA approved for NASAL specimens only), is one component of a comprehensive MRSA colonization surveillance program. It is not intended to diagnose MRSA infection nor to guide or monitor treatment for MRSA infections.   Clostridium Difficile by PCR     Status: None   Collection Time: 02/08/15 11:02 AM  Result Value Ref Range Status   C difficile by pcr NEGATIVE NEGATIVE Final    Medical History: Past Medical History  Diagnosis Date  . Hypertension   . Diabetes mellitus   . Hyperlipidemia   . Cataracts, bilateral   . Hyperkalemia   . UTI (lower urinary tract infection)   . Calculus of kidney   . Sensorineural hearing loss, unspecified   . Epiglottitis   . Chronic ankle pain   . Neuromuscular disorder     neurophaty lower legs  . Upper GI bleeding     while at [facility] 12/2014  . Prostate cancer 2009    radiation  . Confusion   . Gastroparesis due to DM: DELAYED GASTRIC EMPTYING STUDY 02/08/15 02/08/2015    Medications:  Scheduled:   Assessment: home health started him on an antibiotic and pyridium for UTI Cefepime per pharmacy protocol for urosepsis Cefepime 2 GM IV x 1 dose ordered by ED MD Reduced renal function  Goal of Therapy:  Eradicate infection  Plan:  Cefepime 1 GM IV every 24 hours, starting 02/27/2015 6 PM Monitor renal function Labs per protocol  Abner Greenspan, Fariha Goto [employer]  02/26/2015,5:23 PM

## 2015-02-26 NOTE — ED Notes (Signed)
CRITICAL VALUE ALERT  Critical value received:  Lactic acid 2.55  Date of notification:  02/26/2015  Time of notification:  4287  Critical value read back:Yes.    Nurse who received alert:  [facility] RN  MD notified (1st page):  Dr. Betsey Holiday   Time of first page:  1824  MD notified (2nd page):  Time of second page:  Responding MD:  Dr. Betsey Holiday  Time MD responded:  [phone]

## 2015-02-27 ENCOUNTER — Inpatient Hospital Stay (HOSPITAL_COMMUNITY): Payer: Medicare Other

## 2015-02-27 LAB — GLUCOSE, CAPILLARY
GLUCOSE-CAPILLARY: 113 mg/dL — AB (ref 70–99)
Glucose-Capillary: 113 mg/dL — ABNORMAL HIGH (ref 70–99)
Glucose-Capillary: 112 mg/dL — ABNORMAL HIGH (ref 70–99)
Glucose-Capillary: 186 mg/dL — ABNORMAL HIGH (ref 70–99)
Glucose-Capillary: 95 mg/dL (ref 70–99)

## 2015-02-27 LAB — BASIC METABOLIC PANEL
Anion gap: 6 (ref 5–15)
BUN: 50 mg/dL — AB (ref 6–23)
CALCIUM: 8.1 mg/dL — AB (ref 8.4–10.5)
CO2: 20 mmol/L (ref 19–32)
CREATININE: 3.02 mg/dL — AB (ref 0.50–1.35)
Chloride: 116 mmol/L — ABNORMAL HIGH (ref 96–112)
GFR, EST AFRICAN AMERICAN: 21 mL/min — AB (ref >90)
GFR, EST NON AFRICAN AMERICAN: 18 mL/min — AB (ref >90)
Glucose, Bld: 147 mg/dL — ABNORMAL HIGH (ref 70–99)
POTASSIUM: 5.7 mmol/L — AB (ref 3.5–5.1)
Sodium: 142 mmol/L (ref 135–145)

## 2015-02-27 LAB — CBC
HEMATOCRIT: 27.8 % — AB (ref 39.0–52.0)
HEMOGLOBIN: 8.7 g/dL — AB (ref 13.0–17.0)
MCH: 28.5 pg (ref 26.0–34.0)
MCHC: 31.3 g/dL (ref 30.0–36.0)
MCV: 91.1 fL (ref 78.0–100.0)
Platelets: 123 10*3/uL — ABNORMAL LOW (ref 150–400)
RBC: 3.05 MIL/uL — AB (ref 4.22–5.81)
RDW: 14.4 % (ref 11.5–15.5)
WBC: 7.4 10*3/uL (ref 4.0–10.5)

## 2015-02-27 LAB — URINE CULTURE
Colony Count: NO GROWTH
Culture: NO GROWTH

## 2015-02-27 LAB — POTASSIUM: Potassium: 4.8 mmol/L (ref 3.5–5.1)

## 2015-02-27 MED ORDER — ATORVASTATIN CALCIUM 40 MG PO TABS
40.0000 mg | ORAL_TABLET | Freq: Every day | ORAL | Status: DC
Start: 2015-02-27 — End: 2015-03-09
  Administered 2015-02-27 – 2015-03-09 (×10): 40 mg via ORAL
  Filled 2015-02-27 (×4): qty 1
  Filled 2015-02-27: qty 2
  Filled 2015-02-27 (×6): qty 1

## 2015-02-27 MED ORDER — SODIUM POLYSTYRENE SULFONATE 15 GM/60ML PO SUSP
30.0000 g | Freq: Once | ORAL | Status: AC
  Administered 2015-02-27: 30 g via ORAL
  Filled 2015-02-27: qty 120

## 2015-02-27 MED ORDER — POLYETHYLENE GLYCOL 3350 17 G PO PACK
17.0000 g | PACK | Freq: Two times a day (BID) | ORAL | Status: DC
  Administered 2015-02-27 – 2015-03-07 (×7): 17 g via ORAL
  Filled 2015-02-27 (×17): qty 1

## 2015-02-27 MED ORDER — SODIUM CHLORIDE 0.9 % IV SOLN
INTRAVENOUS | Status: AC
  Administered 2015-02-27 – 2015-02-28 (×2): via INTRAVENOUS

## 2015-02-27 MED ORDER — METOCLOPRAMIDE HCL 10 MG PO TABS
5.0000 mg | ORAL_TABLET | Freq: Three times a day (TID) | ORAL | Status: DC
  Administered 2015-02-27 (×3): 5 mg via ORAL
  Filled 2015-02-27 (×4): qty 1

## 2015-02-27 MED ORDER — INSULIN DETEMIR 100 UNIT/ML ~~LOC~~ SOLN
10.0000 [IU] | Freq: Every day | SUBCUTANEOUS | Status: DC
Start: 2015-02-27 — End: 2015-02-28
  Administered 2015-02-27: 10 [IU] via SUBCUTANEOUS
  Filled 2015-02-27 (×2): qty 0.1

## 2015-02-27 NOTE — Care Management Utilization Note (Signed)
UR completed 

## 2015-02-27 NOTE — Consult Note (Signed)
We were consulted  on this elderly gentleman with advanced prostate cancer, currently on usual androgen deprivation therapy.  He apparently recently went into urinary retention and has a Foley catheter in place.  He is admitted for UTI with sepsis.  At this point, no specific urologic intervention would be necessary unless he has evidence of hydronephrosis or other renal abnormality.  I would recommend ultrasound of his kidneys to rule out any evidence of the above.  If there is hydronephrosis, stone protocol CT scan would be recommended following that.  Please call me if further urologic consultation requested.

## 2015-02-27 NOTE — Progress Notes (Signed)
PT Cancellation Note  Patient Details Name: Douglas [name] MRN: [id number] DOB: 1931-05-17   Cancelled Treatment:     Wife is concerned about therapy working with husband as she states MD stated no weight bearing on Rt LE.  Therapist attempted to explain that we work with pt with NWB status when wife stated that he has a sore on his other leg and is not suppose to put weight through this leg either.  Wife appeared very upset therapist told wife that therapy will not work with pt until she feels comfortable and to let staff know.  Therapist explained that this meant pt would be staying in bed which will decrease his strength.    RUSSELL,CINDY PT 02/27/2015, 2:07 PM

## 2015-02-27 NOTE — Progress Notes (Signed)
Patient Demographics  Douglas [name], is a 81 y.o. male, DOB - [birth year], TKP:[id number]  Admit date - 02/26/2015   Admitting Physician [name], MD  Outpatient Primary MD for the patient is Douglas B., MD  LOS - 1   Chief Complaint  Patient presents with  . Emesis  . Altered Mental Status        Subjective:   Douglas [name] today has, No headache, No chest pain, No abdominal pain - No Nausea, No new weakness tingling or numbness, No Cough - SOB   Assessment & Plan    1.Sepsis due to UTI - has indwelling Foley, on Flomax, has responded very well to IV Maxipime which will be continued, supportive care with IV fluids, blood pressure stable now afebrile and leukocytosis improving. Foley catheter was changed yesterday in the ER. Discussed his case with urologist on call Dr. [name], renal ultrasound will be obtained. Will follow cultures.    2. Recent right femur fracture. Follows with Dr. [name]. Obtain x-ray which was due. Currently no weightbearing on right leg.     3.DM2 - resume home dose Levemir along with sliding scale  Lab Results  Component Value Date   HGBA1C 8.0* 02/05/2015    CBG (last 3)   Recent Labs  02/26/15 2154 02/26/15 2358 02/27/15 0415  GLUCAP 206* 113* 113*     4. Dyslipidemia. Continue statin.    5.GERD - PPI.    6. Acute renal failure on chronic kidney disease stage IV. Baseline creatinine appears to be around 2.5.  secondary to sepsis, hydrate, obtain renal ultrasound and monitor.     7. Diabetic gastroparesis. Place on Reglan pre-meal.      Code Status: Full  Family Communication: wife  Disposition Plan: TBD   Procedures    Renal US   Consults  urology Dr. [name]   Medications  Scheduled Meds: . ceFEPime  (MAXIPIME) IV  1 g Intravenous Q24H  . enoxaparin (LOVENOX) injection  30 mg Subcutaneous Q24H  . gabapentin  300 mg Oral QHS  . insulin aspart  0-9 Units Subcutaneous 6 times per day  . ketorolac  1 drop Both Eyes TID  . pantoprazole  40 mg Oral Daily  . tamsulosin  0.4 mg Oral Daily   Continuous Infusions: . sodium chloride 50 mL/hr at 02/27/15 0903   PRN Meds:.ondansetron **OR** ondansetron (ZOFRAN) IV, oxybutynin  DVT Prophylaxis  Lovenox    Lab Results  Component Value Date   PLT 123* 02/27/2015    Antibiotics     Anti-infectives    Start     Dose/Rate Route Frequency Ordered Stop   02/27/15 1800  ceFEPIme (MAXIPIME) 1 g in dextrose 5 % 50 mL IVPB     1 g 100 mL/hr over 30 Minutes Intravenous Every 24 hours 02/26/15 1729     02/26/15 1730  ceFEPIme (MAXIPIME) 2 g in dextrose 5 % 50 mL IVPB     2 g 100 mL/hr over 30 Minutes Intravenous  Once 02/26/15 1716 02/26/15 1822          Objective:   Filed Vitals:   02/27/15 0500 02/27/15 0700 02/27/15 0800 02/27/15 0900  BP: 119/49 93/72 111/42   Pulse: 96 96 78 99  Temp:   98.7 F (37.1 C)   TempSrc:   Oral   Resp: 16 21 19 19   Height:      Weight: 63 kg (138 lb 14.2 oz)     SpO2: 91% 95% 94% 95%    Wt Readings from Last 3 Encounters:  02/27/15 63 kg (138 lb 14.2 oz)  02/02/15 62.46 kg (137 lb 11.2 oz)  01/17/15 65.318 kg (144 lb)     Intake/Output Summary (Last 24 hours) at 02/27/15 1018 Last data filed at 02/27/15 0903  Gross per 24 hour  Intake 903.75 ml  Output    300 ml  Net 603.75 ml     Physical Exam  Awake Alert, Oriented X 3, some fine tremors, No new F.N deficits, Normal affect [place].AT,PERRAL Supple Neck,No JVD, No cervical lymphadenopathy appriciated.  Symmetrical Chest wall movement, Good air movement bilaterally, CTAB RRR,No Gallops,Rubs or new Murmurs, No Parasternal Heave +ve HahnSounds, Abd Soft, No tenderness, No organomegaly appriciated, No rebound - guarding or rigidity. No  Cyanosis, Clubbing or edema, No new Rash or bruise , foley in place   Data Review   Micro Results Recent Results (from the past 240 hour(s))  Blood Culture (routine x 2)     Status: None (Preliminary result)   Collection Time: 02/26/15  5:24 PM  Result Value Ref Range Status   Specimen Description BLOOD LEFT ANTECUBITAL  Final   Special Requests BOTTLES DRAWN AEROBIC AND ANAEROBIC 8CC  Final   Culture NO GROWTH 1 DAY  Final   Report Status PENDING  Incomplete  Blood Culture (routine x 2)     Status: None (Preliminary result)   Collection Time: 02/26/15  5:30 PM  Result Value Ref Range Status   Specimen Description BLOOD RIGHT ANTECUBITAL  Final   Special Requests BOTTLES DRAWN AEROBIC AND ANAEROBIC 6CC  Final   Culture NO GROWTH 1 DAY  Final   Report Status PENDING  Incomplete    Radiology Reports    Dg Chest Port 1 View  02/26/2015   CLINICAL DATA:  Altered mental status and emesis.  EXAM: PORTABLE CHEST - 1 VIEW  COMPARISON:  02/05/2015  FINDINGS: Chronic elevation of the right hemidiaphragm. Linear densities at lung bases are suggestive for atelectasis. Upper lungs are clear. Heart size is normal. No focal airspace disease.  IMPRESSION: Bibasilar atelectasis.   Electronically Signed   By: Markus Daft M.D.   On: 02/26/2015 17:44        CBC  Recent Labs Lab 02/26/15 1725 02/27/15 0537  WBC 9.5 7.4  HGB 9.6* 8.7*  HCT 29.5* 27.8*  PLT 146* 123*  MCV 89.4 91.1  MCH 29.1 28.5  MCHC 32.5 31.3  RDW 14.0 14.4  LYMPHSABS 0.7  --   MONOABS 0.5  --   EOSABS 0.3  --   BASOSABS 0.1  --     Chemistries   Recent Labs Lab 02/26/15 1725 02/27/15 0537  NA 137 142  K 4.9 5.7*  CL 106 116*  CO2 18* 20  GLUCOSE 264* 147*  BUN 50* 50*  CREATININE 3.03* 3.02*  CALCIUM 8.9 8.1*  AST 19  --   ALT 11  --   ALKPHOS 104  --   BILITOT 0.4  --    ------------------------------------------------------------------------------------------------------------------ estimated  creatinine clearance is 17.1 mL/min (by C-G formula based on Cr of 3.02). ------------------------------------------------------------------------------------------------------------------ No results for input(s): HGBA1C in the last 72 hours. ------------------------------------------------------------------------------------------------------------------ No results for input(s): CHOL, HDL, LDLCALC, TRIG, CHOLHDL, LDLDIRECT  in the last 72 hours. ------------------------------------------------------------------------------------------------------------------ No results for input(s): TSH, T4TOTAL, T3FREE, THYROIDAB in the last 72 hours.  Invalid input(s): FREET3 ------------------------------------------------------------------------------------------------------------------ No results for input(s): VITAMINB12, FOLATE, FERRITIN, TIBC, IRON, RETICCTPCT in the last 72 hours.  Coagulation profile No results for input(s): INR, PROTIME in the last 168 hours.  No results for input(s): DDIMER in the last 72 hours.  Cardiac Enzymes  Recent Labs Lab 02/26/15 1725  TROPONINI <0.03   ------------------------------------------------------------------------------------------------------------------ Invalid input(s): POCBNP     Time Spent in minutes   35   Annakate Soulier K M.D on 02/27/2015 at 10:18 AM  Between 7am to 7pm - Pager - [phone]  After 7pm go to [email or web address] - password [facility]  [name]   Office  [phone]

## 2015-02-28 LAB — CBC
HEMATOCRIT: 24.8 % — AB (ref 39.0–52.0)
HEMOGLOBIN: 8 g/dL — AB (ref 13.0–17.0)
MCH: 29.1 pg (ref 26.0–34.0)
MCHC: 32.3 g/dL (ref 30.0–36.0)
MCV: 90.2 fL (ref 78.0–100.0)
Platelets: 124 10*3/uL — ABNORMAL LOW (ref 150–400)
RBC: 2.75 MIL/uL — AB (ref 4.22–5.81)
RDW: 14.5 % (ref 11.5–15.5)
WBC: 7.6 10*3/uL (ref 4.0–10.5)

## 2015-02-28 LAB — BASIC METABOLIC PANEL
Anion gap: 8 (ref 5–15)
BUN: 43 mg/dL — AB (ref 6–23)
CO2: 19 mmol/L (ref 19–32)
Calcium: 7.8 mg/dL — ABNORMAL LOW (ref 8.4–10.5)
Chloride: 113 mmol/L — ABNORMAL HIGH (ref 96–112)
Creatinine, Ser: 2.94 mg/dL — ABNORMAL HIGH (ref 0.50–1.35)
GFR calc Af Amer: 22 mL/min — ABNORMAL LOW (ref >90)
GFR calc non Af Amer: 19 mL/min — ABNORMAL LOW (ref >90)
Glucose, Bld: 89 mg/dL (ref 70–99)
POTASSIUM: 4 mmol/L (ref 3.5–5.1)
SODIUM: 140 mmol/L (ref 135–145)

## 2015-02-28 LAB — GLUCOSE, CAPILLARY
GLUCOSE-CAPILLARY: 89 mg/dL (ref 70–99)
GLUCOSE-CAPILLARY: 74 mg/dL (ref 70–99)
GLUCOSE-CAPILLARY: 69 mg/dL — AB (ref 70–99)
GLUCOSE-CAPILLARY: 119 mg/dL — AB (ref 70–99)
Glucose-Capillary: 133 mg/dL — ABNORMAL HIGH (ref 70–99)
Glucose-Capillary: 70 mg/dL (ref 70–99)
Glucose-Capillary: 92 mg/dL (ref 70–99)

## 2015-02-28 LAB — URINE CULTURE
Colony Count: NO GROWTH
Culture: NO GROWTH

## 2015-02-28 MED ORDER — INSULIN ASPART 100 UNIT/ML ~~LOC~~ SOLN
0.0000 [IU] | Freq: Three times a day (TID) | SUBCUTANEOUS | Status: DC
  Administered 2015-03-01 (×2): 1 [IU] via SUBCUTANEOUS
  Administered 2015-03-02 (×2): 2 [IU] via SUBCUTANEOUS
  Administered 2015-03-03: 1 [IU] via SUBCUTANEOUS
  Administered 2015-03-03 – 2015-03-04 (×3): 2 [IU] via SUBCUTANEOUS
  Administered 2015-03-04 (×2): 1 [IU] via SUBCUTANEOUS
  Administered 2015-03-05 – 2015-03-06 (×3): 2 [IU] via SUBCUTANEOUS
  Administered 2015-03-06: 1 [IU] via SUBCUTANEOUS
  Administered 2015-03-06 – 2015-03-07 (×2): 2 [IU] via SUBCUTANEOUS
  Administered 2015-03-07: 1 [IU] via SUBCUTANEOUS
  Administered 2015-03-08: 2 [IU] via SUBCUTANEOUS
  Administered 2015-03-09: 1 [IU] via SUBCUTANEOUS

## 2015-02-28 MED ORDER — SODIUM CHLORIDE 0.9 % IV SOLN: INTRAVENOUS | Status: DC

## 2015-02-28 MED ORDER — METOPROLOL TARTRATE 25 MG PO TABS
25.0000 mg | ORAL_TABLET | Freq: Two times a day (BID) | ORAL | Status: DC
  Administered 2015-02-28 – 2015-03-09 (×18): 25 mg via ORAL
  Filled 2015-02-28 (×19): qty 1

## 2015-02-28 MED ORDER — ACETAMINOPHEN 500 MG PO TABS
500.0000 mg | ORAL_TABLET | Freq: Four times a day (QID) | ORAL | Status: DC | PRN
  Administered 2015-02-28 – 2015-03-06 (×4): 500 mg via ORAL
  Filled 2015-02-28 (×4): qty 1

## 2015-02-28 NOTE — Care Management Note (Addendum)
    Page 1 of 2   03/08/2015     11:19:51 AM CARE MANAGEMENT NOTE 03/08/2015  Patient:  Douglas [name], Douglas [name]   Account Number:  [id number]  Date Initiated:  02/28/2015  Documentation initiated by:  Jolene Provost  Subjective/Objective Assessment:   Pt admitted with sepsis. Pt is from home lives with wife and as baseline independnet with ADL's. Pt recently broke leg and how has cane/walker/WC/BSC/hosp bed. Pt is active with [facility] for RN, PT, and aid.     Action/Plan:   Pt plans to dsicharge home with resumption of HH services throguh [facility]. [facility] is aware of admission. Will cont to follow.   Anticipated DC Date:  03/03/2015   Anticipated DC Plan:  [place]  CM consult      [facility] Choice  Resumption Of Svcs/PTA Provider   Choice offered to / List presented to:  C-1 Patient        [place] arranged  HH-1 RN  [place].   Status of service:  Completed, signed off Medicare Important Message given?  YES (If response is "NO", the following Medicare IM given date fields will be blank) Date Medicare IM given:  03/08/2015 Medicare IM given by:  Theophilus Kinds Date Additional Medicare IM given:   Additional Medicare IM given by:    Discharge Disposition:  [place]  Per UR Regulation:  Reviewed for med. necessity/level of care/duration of stay  If discussed at [place] of Stay Meetings, dates discussed:   03/05/2015  03/07/2015    Comments:  03/08/15 1115 Christinia Gully, RN BSN CM Anticipate discharge within 48 hours. Pts home health resumed with [facility] per family choice. Romualdo Bolk of [facility] is aware and will collect the pts information from the chart. Weekend staff to call and fax orders at discharge. [place] services to start within 48 hours of discharge. No DMe needs noted. Pt and pts nurse aware of discharge arrangements.  02/28/2015 [place], RN, MSN, CM

## 2015-02-28 NOTE — Progress Notes (Signed)
PT Cancellation Note  Patient Details Name: TIEN SPOONER MRN: [id number] DOB: 1931-06-09   Cancelled Treatment:    Reason Eval/Treat Not Completed: Fatigue/lethargy limiting ability to participate.  Pt had just been moved up to 334.  Wife states that he has had a "rough" day with little rest.  Pt would prefer that we begin PT in the AM.   Demetrios Isaacs L 02/28/2015, 3:03 PM

## 2015-02-28 NOTE — Progress Notes (Signed)
Patient transferred to room 334 in safe and stable condition with wife and daughter at bedside. Receiving nurse made aware that patient had arrived. Schonewitz, Eulis Canner 02/28/2015

## 2015-02-28 NOTE — Progress Notes (Signed)
Called report to [facility], LPN on dept [phone]. Verbalized understanding.  Pt sleeping at this time and to be transferred to floor when patient wakes up. Schonewitz, Eulis Canner 02/28/2015

## 2015-02-28 NOTE — Progress Notes (Signed)
Patient Demographics  Douglas [name], is a 81 y.o. male, DOB - 10/10/1931, WJX:[id number]  Admit date - 02/26/2015   Admitting Physician [name], MD  Outpatient Primary MD for the patient is [name],[name], MD  LOS - 2   Chief Complaint  Patient presents with  . Emesis  . Altered Mental Status        Subjective:   Douglas [name] today has, No headache, No chest pain, No abdominal pain - No Nausea, No new weakness tingling or numbness, No Cough - SOB   Assessment & Plan    1.Sepsis due to UTI - has indwelling Foley, on Flomax, has responded very well to IV Maxipime which will be continued, supportive care with IV fluids, blood pressure stable now afebrile and leukocytosis improving. Foley catheter was changed in the ER. Discussed his case with urologist on call Dr. [name], renal ultrasound stable. Will follow cultures.    2. Recent right femur fracture. Discussed with Dr. [name] reviewed his x-ray and case with Dr. [name], per Dr. [name] okay to bear weight as tolerated on right leg and initiate PT. Follow with him post discharge.     3.DM2 - resume home dose Levemir along with sliding scale  Lab Results  Component Value Date   HGBA1C 8.0* 02/05/2015    CBG (last 3)   Recent Labs  02/28/15 0415 02/28/15 0507 02/28/15 0735  GLUCAP 70 89 74     4. Dyslipidemia. Continue statin.    5.GERD - PPI.    6. Acute renal failure on chronic kidney disease stage IV. Baseline creatinine appears to be around 2.5.  secondary to sepsis, hydrate, obtain renal ultrasound and monitor.     7. Diabetic gastroparesis. Placed on Reglan pre-meal his home Med.      Code Status: Full  Family Communication: wife  Disposition Plan: TBD   Procedures    Renal  US   Consults  urology Dr. [name]   Medications  Scheduled Meds: . atorvastatin  40 mg Oral Daily  . ceFEPime (MAXIPIME) IV  1 g Intravenous Q24H  . enoxaparin (LOVENOX) injection  30 mg Subcutaneous Q24H  . gabapentin  300 mg Oral QHS  . insulin aspart  0-9 Units Subcutaneous 6 times per day  . insulin detemir  10 Units Subcutaneous QHS  . ketorolac  1 drop Both Eyes TID  . metoprolol tartrate  25 mg Oral BID  . pantoprazole  40 mg Oral Daily  . polyethylene glycol  17 g Oral BID  . tamsulosin  0.4 mg Oral Daily   Continuous Infusions: . sodium chloride     PRN Meds:.ondansetron **OR** ondansetron (ZOFRAN) IV, oxybutynin  DVT Prophylaxis  Lovenox    Lab Results  Component Value Date   PLT 124* 02/28/2015    Antibiotics     Anti-infectives    Start     Dose/Rate Route Frequency Ordered Stop   02/27/15 1800  ceFEPIme (MAXIPIME) 1 g in dextrose 5 % 50 mL IVPB     1 g 100 mL/hr over 30 Minutes Intravenous Every 24 hours 02/26/15 1729     02/26/15 1730  ceFEPIme (MAXIPIME) 2 g in dextrose 5 % 50 mL IVPB     2  g 100 mL/hr over 30 Minutes Intravenous  Once 02/26/15 1716 02/26/15 1822          Objective:   Filed Vitals:   02/28/15 0500 02/28/15 0600 02/28/15 0700 02/28/15 0800  BP: 121/52 128/56 118/79 137/56  Pulse: 98 98 113 111  Temp:    98.5 F (36.9 C)  TempSrc:    Oral  Resp: 12 19 21 23   Height:      Weight: 64 kg (141 lb 1.5 oz)     SpO2: 96% 96% 96% 95%    Wt Readings from Last 3 Encounters:  02/28/15 64 kg (141 lb 1.5 oz)  02/02/15 62.46 kg (137 lb 11.2 oz)  01/17/15 65.318 kg (144 lb)     Intake/Output Summary (Last 24 hours) at 02/28/15 0929 Last data filed at 02/28/15 0723  Gross per 24 hour  Intake 1447.5 ml  Output   1450 ml  Net   -2.5 ml     Physical Exam  Awake , mild confusion, Oriented X 1, some fine tremors, No new F.N deficits, Normal affect Douglas [name].AT,PERRAL Supple Neck,No JVD, No cervical lymphadenopathy appriciated.   Symmetrical Chest wall movement, Good air movement bilaterally, CTAB RRR,No Gallops,Rubs or new Murmurs, No Parasternal Heave +ve B.Sounds, Abd Soft, No tenderness, No organomegaly appriciated, No rebound - guarding or rigidity. No Cyanosis, Clubbing or edema, No new Rash or bruise , foley in place   Data Review   Micro Results Recent Results (from the past 240 hour(s))  Blood Culture (routine x 2)     Status: None (Preliminary result)   Collection Time: 02/26/15  5:24 PM  Result Value Ref Range Status   Specimen Description BLOOD LEFT ANTECUBITAL  Final   Special Requests BOTTLES DRAWN AEROBIC AND ANAEROBIC 8CC  Final   Culture NO GROWTH 1 DAY  Final   Report Status PENDING  Incomplete  Blood Culture (routine x 2)     Status: None (Preliminary result)   Collection Time: 02/26/15  5:30 PM  Result Value Ref Range Status   Specimen Description BLOOD RIGHT ANTECUBITAL  Final   Special Requests BOTTLES DRAWN AEROBIC AND ANAEROBIC 6CC  Final   Culture NO GROWTH 1 DAY  Final   Report Status PENDING  Incomplete  Urine culture     Status: None   Collection Time: 02/26/15  5:40 PM  Result Value Ref Range Status   Specimen Description URINE, CATHETERIZED  Final   Special Requests NONE  Final   Colony Count NO GROWTH Performed at [facility]   Final   Culture NO GROWTH Performed at [facility]   Final   Report Status 02/27/2015 FINAL  Final    Radiology Reports    Dg Chest Port 1 View  02/26/2015   CLINICAL DATA:  Altered mental status and emesis.  EXAM: PORTABLE CHEST - 1 VIEW  COMPARISON:  02/05/2015  FINDINGS: Chronic elevation of the right hemidiaphragm. Linear densities at lung bases are suggestive for atelectasis. Upper lungs are clear. Heart size is normal. No focal airspace disease.  IMPRESSION: Bibasilar atelectasis.   Electronically Signed   By: Markus Daft M.D.   On: 02/26/2015 17:44        CBC  Recent Labs Lab 02/26/15 1725 02/27/15 0537  02/28/15 0505  WBC 9.5 7.4 7.6  HGB 9.6* 8.7* 8.0*  HCT 29.5* 27.8* 24.8*  PLT 146* 123* 124*  MCV 89.4 91.1 90.2  MCH 29.1 28.5 29.1  MCHC 32.5 31.3 32.3  RDW 14.0 14.4 14.5  LYMPHSABS 0.7  --   --   MONOABS 0.5  --   --   EOSABS 0.3  --   --   BASOSABS 0.1  --   --     Chemistries   Recent Labs Lab 02/26/15 1725 02/27/15 0537 02/27/15 1328 02/28/15 0505  NA 137 142  --  140  K 4.9 5.7* 4.8 4.0  CL 106 116*  --  113*  CO2 18* 20  --  19  GLUCOSE 264* 147*  --  89  BUN 50* 50*  --  43*  CREATININE 3.03* 3.02*  --  2.94*  CALCIUM 8.9 8.1*  --  7.8*  AST 19  --   --   --   ALT 11  --   --   --   ALKPHOS 104  --   --   --   BILITOT 0.4  --   --   --    ------------------------------------------------------------------------------------------------------------------ estimated creatinine clearance is 17.8 mL/min (by C-G formula based on Cr of 2.94). ------------------------------------------------------------------------------------------------------------------ No results for input(s): HGBA1C in the last 72 hours. ------------------------------------------------------------------------------------------------------------------ No results for input(s): CHOL, HDL, LDLCALC, TRIG, CHOLHDL, LDLDIRECT in the last 72 hours. ------------------------------------------------------------------------------------------------------------------ No results for input(s): TSH, T4TOTAL, T3FREE, THYROIDAB in the last 72 hours.  Invalid input(s): FREET3 ------------------------------------------------------------------------------------------------------------------ No results for input(s): VITAMINB12, FOLATE, FERRITIN, TIBC, IRON, RETICCTPCT in the last 72 hours.  Coagulation profile No results for input(s): INR, PROTIME in the last 168 hours.  No results for input(s): DDIMER in the last 72 hours.  Cardiac Enzymes  Recent Labs Lab 02/26/15 1725  TROPONINI <0.03    ------------------------------------------------------------------------------------------------------------------ Invalid input(s): POCBNP     Time Spent in minutes   35   Hadden Steig K M.D on 02/28/2015 at 9:29 AM  Between 7am to 7pm - Pager - [phone]  After 7pm go to [email or web address] - password [facility]  [name]   Office  [phone]

## 2015-02-28 NOTE — Clinical Social Work Psychosocial (Signed)
Clinical Social Work Department BRIEF PSYCHOSOCIAL ASSESSMENT 02/28/2015  Patient:  INDY, KUCK     Account Number:  [id number]     Admit date:  02/26/2015  Clinical Social Worker:  Daiva Huge  Date/Time:  02/28/2015 02:07 PM  Referred by:  Physician  Date Referred:  02/28/2015 Referred for  SNF Placement   Other Referral:   Interview type:  Other - See comment Other interview type:   Met wtih patient's wife and daughter at bedside- patient was sleeping throughout viist.    PSYCHOSOCIAL DATA Living Status:  FAMILY Admitted from facility:   Level of care:   Primary support name:  wife Primary support relationship to patient:  FAMILY Degree of support available:   good    CURRENT CONCERNS Current Concerns  Post-Acute Placement   Other Concerns:    SOCIAL WORK ASSESSMENT / PLAN CSW met with patient's wife and daughter Izora Gala at bedside- wife of 79 years states he fell in January walking to the mailbox and broke his leg- he was transferred to [facility] for surgery and from there went to SNF bed at [facility] Per wife, he was unable to do much there because of his leg fx and being NWB. He stayed there a short while and has been at home being cared for by wife and family (as well as [facility] services).  CSW spoke with wife and daughter about the possibility of going to SNF at d/c.  Wife feels quite strongly about taking him home at dc with family and HH support. "He would rather be at home and we would too". She reports having all needed DME at home including wheelchair and ramp.   Assessment/plan status:  No Further Intervention Required Other assessment/ plan:   Information/referral to community resources:    PATIENT'S/FAMILY'S RESPONSE TO PLAN OF CARE: Patient's wife shared that he was a Theme park manager in [place] and "he did alot to help others over the years". She is very committed to caring for him and providing his care in the home and not in a SNF setting. She is hopeful now that he  has become WBAT he will become more mobile and able to get around as well as to progress with PT.  "he's 81 years old so he is kinda slow but he can do it and we have lots of help".    CSW has updated RNCM and will sign off as they plan to dc home.       Eduard Clos, MSW, [place]

## 2015-02-28 NOTE — Progress Notes (Signed)
Inpatient Diabetes Program Recommendations  AACE/ADA: New Consensus Statement on Inpatient Glycemic Control (2013)  Target Ranges:  Prepandial:   less than 140 mg/dL      Peak postprandial:   less than 180 mg/dL (1-2 hours)      Critically ill patients:  140 - 180 mg/dL   Results for HIROTO, SALTZMAN (MRN [id number]) as of 02/28/2015 12:59  Ref. Range 02/27/2015 16:08 02/27/2015 20:38 02/28/2015 00:12 02/28/2015 04:15 02/28/2015 05:07 02/28/2015 07:35 02/28/2015 11:21  Glucose-Capillary Latest Range: 70-99 mg/dL 95 133 (H) 92 70 89 74 69 (L)    Outpatient Diabetes medications: Levemir 10 units QHS, Novolog 0-15 units TID with meals Current orders for Inpatient glycemic control: Levemir 10 units QHS, Novolog 0-9 units TID with meals  Inpatient Diabetes Program Recommendations Insulin - Basal: Noted CBG of 70 mg/dl at 4:15, 74 mg/dl at 7:35, and 69 mg/dl at 11:21 today. Please consider decreasing Levemir to 5 units QHS and adjust accordingly.  Thanks, Barnie Alderman, RN, MSN, CCRN, CDE Diabetes Coordinator Inpatient Diabetes Program [phone] (Team Pager) [phone] ([facility] office) [phone] [facility] office)

## 2015-02-28 NOTE — Progress Notes (Signed)
Patient asleep and resting well at this time after having a restless night per previous shift report and patient's family. Family requested that he be left to sleep at this time rather than getting a bath and getting up to a chair.  Will continue to monitor. Schonewitz, Eulis Canner 02/28/2015

## 2015-03-01 ENCOUNTER — Inpatient Hospital Stay (HOSPITAL_COMMUNITY): Payer: Medicare Other

## 2015-03-01 DIAGNOSIS — A419 Sepsis, unspecified organism: Secondary | ICD-10-CM

## 2015-03-01 DIAGNOSIS — N390 Urinary tract infection, site not specified: Secondary | ICD-10-CM

## 2015-03-01 DIAGNOSIS — R339 Retention of urine, unspecified: Secondary | ICD-10-CM

## 2015-03-01 DIAGNOSIS — T8351XA Infection and inflammatory reaction due to indwelling urinary catheter, initial encounter: Secondary | ICD-10-CM

## 2015-03-01 DIAGNOSIS — R310 Gross hematuria: Secondary | ICD-10-CM

## 2015-03-01 LAB — BASIC METABOLIC PANEL
Anion gap: 8 (ref 5–15)
BUN: 45 mg/dL — ABNORMAL HIGH (ref 6–23)
CALCIUM: 7.8 mg/dL — AB (ref 8.4–10.5)
CO2: 20 mmol/L (ref 19–32)
Chloride: 112 mmol/L (ref 96–112)
Creatinine, Ser: 3.11 mg/dL — ABNORMAL HIGH (ref 0.50–1.35)
GFR calc Af Amer: 20 mL/min — ABNORMAL LOW (ref >90)
GFR calc non Af Amer: 17 mL/min — ABNORMAL LOW (ref >90)
GLUCOSE: 132 mg/dL — AB (ref 70–99)
Potassium: 4.4 mmol/L (ref 3.5–5.1)
Sodium: 140 mmol/L (ref 135–145)

## 2015-03-01 LAB — GLUCOSE, CAPILLARY
GLUCOSE-CAPILLARY: 122 mg/dL — AB (ref 70–99)
Glucose-Capillary: 125 mg/dL — ABNORMAL HIGH (ref 70–99)
Glucose-Capillary: 103 mg/dL — ABNORMAL HIGH (ref 70–99)
Glucose-Capillary: 114 mg/dL — ABNORMAL HIGH (ref 70–99)

## 2015-03-01 LAB — OSMOLALITY: Osmolality: 302 mOsm/kg — ABNORMAL HIGH (ref 275–300)

## 2015-03-01 MED ORDER — FUROSEMIDE 10 MG/ML IJ SOLN
20.0000 mg | Freq: Once | INTRAMUSCULAR | Status: AC
  Administered 2015-03-01: 20 mg via INTRAVENOUS
  Filled 2015-03-01: qty 2

## 2015-03-01 MED ORDER — ALBUTEROL SULFATE (2.5 MG/3ML) 0.083% IN NEBU: 2.5000 mg | INHALATION_SOLUTION | RESPIRATORY_TRACT | Status: DC | PRN

## 2015-03-01 NOTE — Progress Notes (Signed)
Obtained orders per singh d/c fluids, lasix 20 mg iv, advance foley, obtain bladder scan, chest x ray and neb tx.

## 2015-03-01 NOTE — Evaluation (Signed)
Clinical/Bedside Swallow Evaluation Patient Details  Name: Douglas [name] MRN: [id number] Date of Birth: [birth year]  Today's Date: 03/01/2015 Time: SLP Start Time (ACUTE ONLY): 3546 SLP Stop Time (ACUTE ONLY): 1314 SLP Time Calculation (min) (ACUTE ONLY): 29 min  Past Medical History:  Past Medical History  Diagnosis Date  . Hypertension   . Diabetes mellitus   . Hyperlipidemia   . Cataracts, bilateral   . Hyperkalemia   . UTI (lower urinary tract infection)   . Calculus of kidney   . Sensorineural hearing loss, unspecified   . Epiglottitis   . Chronic ankle pain   . Neuromuscular disorder     neurophaty lower legs  . Upper GI bleeding     while at [facility] 12/2014  . Prostate cancer 2009    radiation  . Confusion   . Gastroparesis due to DM: DELAYED GASTRIC EMPTYING STUDY 02/08/15 02/08/2015   Past Surgical History:  Past Surgical History  Procedure Laterality Date  . Eye surgery  sept. 2015    cataract surgery  . Hernia repair      severa; years can't remeber  . Femur im nail Right 01/18/2015    Procedure: INTRAMEDULLARY (IM) RETROGRADE FEMORAL NAILING;  Surgeon: Renette Butters, MD;  Location: [place];  Service: Orthopedics;  Laterality: Right;   HPI:  Douglas [name] is an 81 yo male who was admitted to [facility] for management of sepsis secondary to UTI. He is known to SLP from previous admission in February. At that time he presented with symptoms of esophageal dysphagia and had a gastric emptying study completed which did show delayed gastric emptying and was placed on reglan. Family states that pt had been doing fairly well over the past 2 weeks at home, but appetite had been poor. SLP asked to evaluate swallow due to regurgitation after po.   Assessment / Plan / Recommendation Clinical Impression  Douglas [name] was lethargic during my assessment, but family present and able to provide background information. Wife reports that pt was doing well at home, mostly bedbound due to  leg fracture and non weightbearing status. His appetite was poor however. Pt was seen for gastric emptying study during last admission and diagnosed with delayed emptying and put on Reglan. Pt is now regurgitating again per family (although ate very well yesterday lunch). Clinical evaluation was limited today due to pt lethargy, however suspect pt has esophageal dysmotility which is causing his regurgitation. Consider GI consult (family says he had EGD this past summer in [place]) and/or swallow function (either MBSS or barium swallow) when pt able to participate. Radiology does not do this over the weekend, but could be arranged for Monday. Continue diet as ordered, small bites/sips, when pt is alert and upright. Remain upright 30-60" after meals. SLP will follow up on Monday    Aspiration Risk  Mild    Diet Recommendation Dysphagia 3 (Mechanical Soft);Thin liquid   Liquid Administration via: Cup;Straw Medication Administration: Whole meds with liquid Supervision: Patient able to self feed;Intermittent supervision to cue for compensatory strategies Compensations: Slow rate;Small sips/bites;Multiple dry swallows after each bite/sip Postural Changes and/or Swallow Maneuvers: Seated upright 90 degrees;Upright 30-60 min after meal    Other  Recommendations Recommended Consults: Consider GI evaluation Oral Care Recommendations: Oral care BID Other Recommendations: Clarify dietary restrictions   Follow Up Recommendations       Frequency and Duration min 2x/week  1 week   Pertinent Vitals/Pain O2 via nasal cannula    SLP Swallow  Goals   Pt will demonstrate safe and efficient consumption of least restrictive diet with use of strategies as needed.    Swallow Study Prior Functional Status   Lives at home with wife    General Date of Onset: 02/26/15 HPI: Douglas [name] is an 81 yo male who was admitted to [facility] for management of sepsis secondary to UTI. He is known to SLP from previous  admission in February. At that time he presented with symptoms of esophageal dysphagia and had a gastric emptying study completed which did show delayed gastric emptying and was placed on reglan. Family states that pt had been doing fairly well over the past 2 weeks at home, but appetite had been poor. SLP asked to evaluate swallow due to regurgitation after po. Type of Study: Bedside swallow evaluation Previous Swallow Assessment: BSE early February D3/thin Diet Prior to this Study: Dysphagia 3 (soft);Thin liquids Temperature Spikes Noted: Yes Respiratory Status: Nasal cannula History of Recent Intubation: No Behavior/Cognition: Lethargic;Requires cueing Oral Cavity - Dentition: Poor condition Self-Feeding Abilities: Total assist Patient Positioning: Upright in bed Baseline Vocal Quality: Clear Volitional Cough: Cognitively unable to elicit Volitional Swallow: Unable to elicit    Oral/Motor/Sensory Function Overall Oral Motor/Sensory Function: Other (comment) (difficult to assess due to lethargy)   Ice Chips Ice chips: Not tested   Thin Liquid Thin Liquid: Impaired Presentation: Straw Oral Phase Impairments: Reduced labial seal;Poor awareness of bolus    Nectar Thick Nectar Thick Liquid: Not tested   Honey Thick Honey Thick Liquid: Not tested   Puree Puree: Not tested   Solid       Solid: Not tested      Thank you,  Genene Churn, [place]  Brocha Gilliam 03/01/2015,3:24 PM

## 2015-03-01 NOTE — Progress Notes (Signed)
Patient Demographics  Douglas [name], is a 81 y.o. male, DOB - 06/03/1931, RFX:[id number]  Admit date - 02/26/2015   Admitting Physician [name], MD  Outpatient Primary MD for the patient is Douglas [name],Douglas B., MD  LOS - 3   Chief Complaint  Patient presents with  . Emesis  . Altered Mental Status        Subjective:   [name] today has, No headache, No chest pain, No abdominal pain - No Nausea, No new weakness tingling or numbness, No Cough - SOB   Assessment & Plan    1.Sepsis due to UTI - has indwelling Foley, on Flomax, has responded very well to IV Maxipime which will be continued, blood pressure stable now afebrile and leukocytosis improving. Foley catheter was changed in the ER. Discussed his case with urologist on call Dr. [name], renal ultrasound stable. Will follow cultures.    2. Recent right femur fracture. Discussed with Dr. [name] reviewed his x-ray and case with Dr. [name], per Dr. [name] okay to bear weight as tolerated on right leg and initiate PT. Follow with him post discharge.     3.DM2 - and she oral intake inconsistent continue only on sliding scale  Lab Results  Component Value Date   HGBA1C 8.0* 02/05/2015    CBG (last 3)   Recent Labs  02/28/15 1121 02/28/15 1615 03/01/15 0742  GLUCAP 69* 119* 125*     4. Dyslipidemia. Continue statin.    5.GERD - PPI.    6. Acute renal failure on chronic kidney disease stage IV. Baseline creatinine appears to be around 2.5.  secondary to sepsis, hydrate, stable renal ultrasound will consult nephrology. Have adequately hydrated him, now developed some wheezing and pulmonary congestion on chest x-ray stop IV fluids gentle Lasix.    7. Diabetic gastroparesis. Placed on Reglan pre-meal his home Med.      8. Delirium. Due to advanced age, infection acute renal failure. Supportive care. Address for aspiration of fall. Speech therapy requested to monitor.    9. Mild wheezing and pulmonary congestion on chest x-ray. With mild acute on chronic diastolic CHF last EF 26% on echogram. Hold IV fluids, gentle IV Lasix one time, continue beta blocker and monitor.           Code Status: Full  Family Communication: wife  Disposition Plan: TBD   Procedures    Renal US - non acute   Consults  urology Dr. [name], Renal   Medications  Scheduled Meds: . atorvastatin  40 mg Oral Daily  . ceFEPime (MAXIPIME) IV  1 g Intravenous Q24H  . enoxaparin (LOVENOX) injection  30 mg Subcutaneous Q24H  . gabapentin  300 mg Oral QHS  . insulin aspart  0-9 Units Subcutaneous TID WC  . ketorolac  1 drop Both Eyes TID  . metoprolol tartrate  25 mg Oral BID  . pantoprazole  40 mg Oral Daily  . polyethylene glycol  17 g Oral BID  . tamsulosin  0.4 mg Oral Daily   Continuous Infusions:   PRN Meds:.acetaminophen, albuterol, ondansetron **OR** ondansetron (ZOFRAN) IV, oxybutynin  DVT Prophylaxis  Lovenox    Lab Results  Component Value Date   PLT 124* 02/28/2015    Antibiotics  Anti-infectives    Start     Dose/Rate Route Frequency Ordered Stop   02/27/15 1800  ceFEPIme (MAXIPIME) 1 g in dextrose 5 % 50 mL IVPB     1 g 100 mL/hr over 30 Minutes Intravenous Every 24 hours 02/26/15 1729     02/26/15 1730  ceFEPIme (MAXIPIME) 2 g in dextrose 5 % 50 mL IVPB     2 g 100 mL/hr over 30 Minutes Intravenous  Once 02/26/15 1716 02/26/15 1822          Objective:   Filed Vitals:   02/28/15 1400 02/28/15 1500 02/28/15 2206 03/01/15 0558  BP: 108/46 131/58 122/73 128/77  Pulse: 79 102 96 116  Temp:  99.4 F (37.4 C) 98.7 F (37.1 C) 99.8 F (37.7 C)  TempSrc:   Oral Oral  Resp: 17 20 20 20   Height:      Weight:      SpO2: 95% 90% 93% 92%    Wt Readings from Last 3  Encounters:  02/28/15 64 kg (141 lb 1.5 oz)  02/02/15 62.46 kg (137 lb 11.2 oz)  01/17/15 65.318 kg (144 lb)     Intake/Output Summary (Last 24 hours) at 03/01/15 1008 Last data filed at 02/28/15 1800  Gross per 24 hour  Intake 679.16 ml  Output    800 ml  Net -120.84 ml     Physical Exam  Awake , mild confusion, Oriented X 1, some fine tremors, No new F.N deficits, Normal affect [place].AT,PERRAL Supple Neck,No JVD, No cervical lymphadenopathy appriciated.  Symmetrical Chest wall movement, Good air movement bilaterally, CTAB RRR,No Gallops,Rubs or new Murmurs, No Parasternal Heave +ve HahnSounds, Abd Soft, No tenderness, No organomegaly appriciated, No rebound - guarding or rigidity. No Cyanosis, Clubbing or edema, No new Rash or bruise , foley in place   Data Review   Micro Results Recent Results (from the past 240 hour(s))  Blood Culture (routine x 2)     Status: None (Preliminary result)   Collection Time: 02/26/15  5:24 PM  Result Value Ref Range Status   Specimen Description BLOOD LEFT ANTECUBITAL  Final   Special Requests BOTTLES DRAWN AEROBIC AND ANAEROBIC 8CC  Final   Culture NO GROWTH 3 DAYS  Final   Report Status PENDING  Incomplete  Blood Culture (routine x 2)     Status: None (Preliminary result)   Collection Time: 02/26/15  5:30 PM  Result Value Ref Range Status   Specimen Description BLOOD RIGHT ANTECUBITAL  Final   Special Requests BOTTLES DRAWN AEROBIC AND ANAEROBIC 6CC  Final   Culture NO GROWTH 3 DAYS  Final   Report Status PENDING  Incomplete  Urine culture     Status: None   Collection Time: 02/26/15  5:40 PM  Result Value Ref Range Status   Specimen Description URINE, CATHETERIZED  Final   Special Requests NONE  Final   Colony Count NO GROWTH Performed at [facility]   Final   Culture NO GROWTH Performed at [facility]   Final   Report Status 02/27/2015 FINAL  Final  Urine culture     Status: None   Collection Time: 02/26/15  10:58 PM  Result Value Ref Range Status   Specimen Description URINE, CLEAN CATCH  Final   Special Requests NONE  Final   Colony Count NO GROWTH Performed at [facility]   Final   Culture NO GROWTH Performed at [facility]   Final   Report Status  02/28/2015 FINAL  Final    Radiology Reports    Dg Chest Port 1 View  02/26/2015   CLINICAL DATA:  Altered mental status and emesis.  EXAM: PORTABLE CHEST - 1 VIEW  COMPARISON:  02/05/2015  FINDINGS: Chronic elevation of the right hemidiaphragm. Linear densities at lung bases are suggestive for atelectasis. Upper lungs are clear. Heart size is normal. No focal airspace disease.  IMPRESSION: Bibasilar atelectasis.   Electronically Signed   By: Markus Daft M.D.   On: 02/26/2015 17:44        CBC  Recent Labs Lab 02/26/15 1725 02/27/15 0537 02/28/15 0505  WBC 9.5 7.4 7.6  HGB 9.6* 8.7* 8.0*  HCT 29.5* 27.8* 24.8*  PLT 146* 123* 124*  MCV 89.4 91.1 90.2  MCH 29.1 28.5 29.1  MCHC 32.5 31.3 32.3  RDW 14.0 14.4 14.5  LYMPHSABS 0.7  --   --   MONOABS 0.5  --   --   EOSABS 0.3  --   --   BASOSABS 0.1  --   --     Chemistries   Recent Labs Lab 02/26/15 1725 02/27/15 0537 02/27/15 1328 02/28/15 0505 03/01/15 0643  NA 137 142  --  140 140  K 4.9 5.7* 4.8 4.0 4.4  CL 106 116*  --  113* 112  CO2 18* 20  --  19 20  GLUCOSE 264* 147*  --  89 132*  BUN 50* 50*  --  43* 45*  CREATININE 3.03* 3.02*  --  2.94* 3.11*  CALCIUM 8.9 8.1*  --  7.8* 7.8*  AST 19  --   --   --   --   ALT 11  --   --   --   --   ALKPHOS 104  --   --   --   --   BILITOT 0.4  --   --   --   --    ------------------------------------------------------------------------------------------------------------------ estimated creatinine clearance is 16.9 mL/min (by C-G formula based on Cr of 3.11). ------------------------------------------------------------------------------------------------------------------ No results for input(s): HGBA1C in  the last 72 hours. ------------------------------------------------------------------------------------------------------------------ No results for input(s): CHOL, HDL, LDLCALC, TRIG, CHOLHDL, LDLDIRECT in the last 72 hours. ------------------------------------------------------------------------------------------------------------------ No results for input(s): TSH, T4TOTAL, T3FREE, THYROIDAB in the last 72 hours.  Invalid input(s): FREET3 ------------------------------------------------------------------------------------------------------------------ No results for input(s): VITAMINB12, FOLATE, FERRITIN, TIBC, IRON, RETICCTPCT in the last 72 hours.  Coagulation profile No results for input(s): INR, PROTIME in the last 168 hours.  No results for input(s): DDIMER in the last 72 hours.  Cardiac Enzymes  Recent Labs Lab 02/26/15 1725  TROPONINI <0.03   ------------------------------------------------------------------------------------------------------------------ Invalid input(s): POCBNP     Time Spent in minutes   35   SINGH,PRASHANT K M.D on 03/01/2015 at 10:08 AM  Between 7am to 7pm - Pager - [phone]  After 7pm go to [email or web address] - password [email or web address]  [name]   Office  [phone]

## 2015-03-01 NOTE — Progress Notes (Signed)
Pt not alert Unable to take PO safely Dr Candiss Norse aware.

## 2015-03-01 NOTE — Evaluation (Signed)
Physical Therapy Evaluation Patient Details Name: Douglas [name] MRN: [id number] DOB: 1930-12-31 Today's Date: 03/01/2015   History of Present Illness  81 yo male with recent hip injury, hospitalization last month with foley since then diagnosed with uti yesterday started on cipro. Progressively worsening today with ams, high fever, shaking all over and vomiting nonbloody. Pt complains of no pain. He lives at home with wife. Previous urine cultures over the last couple of months reviewed, cipro appropriate. Repeat culture data sent in ED. Pt has been nauseas but no vomiting in the ED. Has received 2 liters of ivf, and sepsis protocol. Temp is still over 103. Unknown when last time catheter replaced. Suppose to have urology appt tomorrow for f/u of the indwelling catheter for urinary retention.  It has now been 7 weeks since right femoral fx with OTIF.  Dr. [name] has now allowed [facility] on RLE.  Clinical Impression   Two attempts were made to see pt today for evaluation.  This morning pt was sleeping very soundly and unable to awaken.  This afternoon, he again would not awaken.  With approval of wife, I removed his knee immobilizer and did gentle passive ROM to his hip and knee.  He gradually woke up.  Once awake, he was able to participate in gentle AA ROM exercise to the RLE.  He required min assist to transfer OOB and stand to the walker.  He was able to bear weight on his RLE with no pain and step from bed to chair with a walker.  He should be able to rapidly progress to gait.  Wife reports that PTA he was ambulating short distances in the home (NWB R) with a walker.  They plan to take him home at d/c with HHPT.  He is currently up in a chair and very awake/comfortable.    Follow Up Recommendations Home health PT    Equipment Recommendations  None recommended by PT    Recommendations for Other Services       Precautions / Restrictions Precautions Precautions: Fall Required Braces  or Orthoses: Knee Immobilizer - Right Knee Immobilizer - Right: On when out of bed or walking Restrictions RLE Weight Bearing: Weight bearing as tolerated      Mobility  Bed Mobility Overal bed mobility: Needs Assistance       Supine to sit: Min assist;HOB elevated        Transfers Overall transfer level: Needs assistance Equipment used: Rolling walker (2 wheeled) Transfers: Sit to/from [facility] Sit to Stand: Supervision Stand pivot transfers: Supervision       General transfer comment: pt was able to bear weight on the RLE (immobilizer on) with no pain...he was able to take small steps to move bed to chair  Ambulation/Gait Ambulation/Gait assistance:  (not yet able)              Stairs            Wheelchair Mobility    Modified Rankin (Stroke Patients Only)       Balance Overall balance assessment: Needs assistance Sitting-balance support: No upper extremity supported;Feet supported Sitting balance-Leahy Scale: Good     Standing balance support: Bilateral upper extremity supported Standing balance-Leahy Scale: Fair                               Pertinent Vitals/Pain Pain Assessment: No/denies pain    Home Living Family/patient expects to be discharged  to:: Private residence Living Arrangements: Spouse/significant other Available Help at Discharge: Family;Friend(s);Available 24 hours/day Type of Home: House Home Access: Ramped entrance Entrance Stairs-Rails: Right;Left;Can reach both   Home Layout: One level Home Equipment: Walker - 2 wheels;Walker - 4 wheels;Shower seat;Wheelchair - manual;Hospital bed      Prior Function Level of [place]: Needs assistance   Gait / Transfers Assistance Needed: Prior to hip fracture pt was I but since hip fracture three weeks ago pt needs assist   ADL's / Homemaking Assistance Needed: Prior to hip fracture pt was I but since hip fracture three weeks ago pt needs  assist         Hand Dominance        Extremity/Trunk Assessment               Lower Extremity Assessment: RLE deficits/detail RLE Deficits / Details: immobilizer removed and pt has AA ROM of the knee: 0-60 degrees...strength generally 2/5       Communication   Communication: No difficulties  Cognition Arousal/Alertness: Lethargic Behavior During Therapy: WFL for tasks assessed/performed Overall Cognitive Status: Within Functional Limits for tasks assessed                      General Comments      Exercises General Exercises - Lower Extremity Ankle Circles/Pumps: AROM;Both;10 reps;Supine Quad Sets: AROM;Both;5 reps;Supine Short Arc Quad: AAROM;Right;5 reps;Supine Heel Slides: AROM;AAROM;Both;10 reps;Supine Hip ABduction/ADduction: AROM;AAROM;Both;10 reps;Supine      Assessment/Plan    PT Assessment Patient needs continued PT services  PT Diagnosis Difficulty walking;Generalized weakness   PT Problem List Decreased strength;Decreased range of motion;Decreased activity tolerance;Decreased mobility;Cardiopulmonary status limiting activity  PT Treatment Interventions Gait training;Functional mobility training;Therapeutic exercise   PT Goals (Current goals can be found in the Care Plan section) Acute Rehab PT Goals Patient Stated Goal: return home PT Goal Formulation: With patient/family Time For Goal Achievement: 03/15/15 Potential to Achieve Goals: Good    Frequency Min 3X/week   Barriers to discharge   none    Co-evaluation               End of Session Equipment Utilized During Treatment: Gait belt;Right knee immobilizer;Oxygen Activity Tolerance: Patient tolerated treatment well Patient left: in chair;with call bell/phone within reach;with family/visitor present (wife stays with pt) Nurse Communication: Mobility status         Time: 0211-1552 PT Time Calculation (min) (ACUTE ONLY): 67 min   Charges:   PT Evaluation $Initial  PT Evaluation Tier I: 1 Procedure PT Treatments $Therapeutic Exercise: 8-22 mins   PT G CodesSable Feil 03/01/2015, 3:53 PM

## 2015-03-01 NOTE — Consult Note (Signed)
Subjective: Douglas [name] is well know to me.  I was asked to see him in consultation by Dr. Candiss Norse for urinary retention and hematuria.   Mr. Misener has a history of a T2b Gleason 7(4+3) prostate cancer treated with radiation therapy in 2010.  He developed pelvic lymph node mets and had been started on androgen ablation but he had severe hot flashes with this and was taken off of therapy but his PSA rose to over 20 and the treatment was resumed and then held again when his PSA fell sufficiently.   His last PSA in our office in January was 0.55 which was an increased from his nadir of 0.18.  He has had a history of urge incontinence and was using 2 pads daily.   His recent history is signficant for a fractured right leg in January and after the surgery he developed urinary retention with a PVR of 954ml.  He had a foley placed when he was in the [place] [facility] and failed a subsequent voiding trial despite flomax and the foley was reinserted.   He is admitted no with urosepis and gross hematuria.  His urine is rusty today but the foley appears to be draining well.  He has been leaking around the foley.  A renal US was obtained on 3/2 per Dr. Alan Ripper request and the kidneys were unremarkable.  The bladder was decompressed around the foley.   His urine cultures from 3/1 are negative but he was given an antibiotic prior to admission.   . ROS:  Review of Systems  Unable to perform ROS: dementia   No Known Allergies  Past Medical History  Diagnosis Date  . Hypertension   . Diabetes mellitus   . Hyperlipidemia   . Cataracts, bilateral   . Hyperkalemia   . UTI (lower urinary tract infection)   . Calculus of kidney   . Sensorineural hearing loss, unspecified   . Epiglottitis   . Chronic ankle pain   . Neuromuscular disorder     neurophaty lower legs  . Upper GI bleeding     while at [facility] 12/2014  . Prostate cancer 2009    radiation  . Confusion   . Gastroparesis due to DM: DELAYED GASTRIC  EMPTYING STUDY 02/08/15 02/08/2015    Past Surgical History  Procedure Laterality Date  . Eye surgery  sept. 2015    cataract surgery  . Hernia repair      severa; years can't remeber  . Femur im nail Right 01/18/2015    Procedure: INTRAMEDULLARY (IM) RETROGRADE FEMORAL NAILING;  Surgeon: Renette Butters, MD;  Location: [place];  Service: Orthopedics;  Laterality: Right;    History   Social History  . Marital Status: Married    Spouse Name: N/A  . Number of Children: N/A  . Years of Education: N/A   Occupational History  . Not on file.   Social History Main Topics  . Smoking status: Never Smoker   . Smokeless tobacco: Not on file  . Alcohol Use: No  . Drug Use: No  . Sexual Activity: Not on file   Other Topics Concern  . Not on file   Social History Narrative    History reviewed. No pertinent family history.  Anti-infectives: Anti-infectives    Start     Dose/Rate Route Frequency Ordered Stop   02/27/15 1800  ceFEPIme (MAXIPIME) 1 g in dextrose 5 % 50 mL IVPB     1 g 100 mL/hr over  30 Minutes Intravenous Every 24 hours 02/26/15 1729     02/26/15 1730  ceFEPIme (MAXIPIME) 2 g in dextrose 5 % 50 mL IVPB     2 g 100 mL/hr over 30 Minutes Intravenous  Once 02/26/15 1716 02/26/15 1822      Current Facility-Administered Medications  Medication Dose Route Frequency Provider Last Rate Last Dose  . acetaminophen (TYLENOL) tablet 500 mg  500 mg Oral Q6H PRN Thurnell Lose, MD   500 mg at 02/28/15 1714  . albuterol (PROVENTIL) (2.5 MG/3ML) 0.083% nebulizer solution 2.5 mg  2.5 mg Nebulization Q4H PRN Thurnell Lose, MD      . atorvastatin (LIPITOR) tablet 40 mg  40 mg Oral Daily Thurnell Lose, MD   40 mg at 02/28/15 1100  . ceFEPIme (MAXIPIME) 1 g in dextrose 5 % 50 mL IVPB  1 g Intravenous Q24H Orpah Greek, MD   1 g at 02/28/15 1749  . enoxaparin (LOVENOX) injection 30 mg  30 mg Subcutaneous Q24H [name], MD   30 mg at 02/28/15 2149  . gabapentin  (NEURONTIN) capsule 300 mg  300 mg Oral QHS [name], MD   300 mg at 02/28/15 2148  . insulin aspart (novoLOG) injection 0-9 Units  0-9 Units Subcutaneous TID WC Thurnell Lose, MD   1 Units at 03/01/15 0941  . ketorolac (ACULAR) 0.5 % ophthalmic solution 1 drop  1 drop Both Eyes TID [name], MD   1 drop at 02/28/15 2317  . metoprolol tartrate (LOPRESSOR) tablet 25 mg  25 mg Oral BID Thurnell Lose, MD   25 mg at 02/28/15 2148  . ondansetron (ZOFRAN) tablet 4 mg  4 mg Oral Q6H PRN [name], MD       Or  . ondansetron [facility]) injection 4 mg  4 mg Intravenous Q6H PRN [name], MD   4 mg at 02/27/15 1214  . oxybutynin (DITROPAN) tablet 5 mg  5 mg Oral Q8H PRN [name], MD   5 mg at 02/26/15 2151  . pantoprazole (PROTONIX) EC tablet 40 mg  40 mg Oral Daily [name], MD   40 mg at 02/28/15 1100  . polyethylene glycol (MIRALAX / GLYCOLAX) packet 17 g  17 g Oral BID Thurnell Lose, MD   17 g at 02/28/15 2148  . tamsulosin (FLOMAX) capsule 0.4 mg  0.4 mg Oral Daily [name], MD   0.4 mg at 02/28/15 1100     Objective: Vital signs in last 24 hours: Temp:  [97.6 F (36.4 C)-99.8 F (37.7 C)] 97.6 F (36.4 C) (03/04 1607) Pulse Rate:  [96-116] 100 (03/04 1607) Resp:  [20] 20 (03/04 1607) BP: (118-128)/(52-77) 118/52 mmHg (03/04 1607) SpO2:  [92 %-96 %] 96 % (03/04 1607)  Intake/Output from previous day: 03/03 0701 - 03/04 0700 In: 779.2 [P.O.:240; I.V.:489.2; IV Piggyback:50] Out: 800 [Urine:800] Intake/Output this shift:     Physical Exam  Constitutional: He is well-developed, well-nourished, and in no distress.  Cardiovascular: Normal rate and regular rhythm.   Pulmonary/Chest: Effort normal. No respiratory distress.  Abdominal: Soft. He exhibits no mass. There is no tenderness.  Genitourinary:  98fr foley indwelling draining rusty urine.   Neurological:  He is alert and responsive but confused.  Vitals reviewed.   Lab Results:    Recent Labs  02/27/15 0537 02/28/15 0505  WBC 7.4 7.6  HGB 8.7* 8.0*  HCT 27.8* 24.8*  PLT  123* 124*   BMET  Recent Labs  02/28/15 0505 03/01/15 0643  NA 140 140  K 4.0 4.4  CL 113* 112  CO2 19 20  GLUCOSE 89 132*  BUN 43* 45*  CREATININE 2.94* 3.11*  CALCIUM 7.8* 7.8*   PT/INR No results for input(s): LABPROT, INR in the last 72 hours. ABG  Recent Labs  02/26/15 1815  PHART 7.331*  HCO3 17.5*    Studies/Results: Dg Chest Port 1 View  03/01/2015   CLINICAL DATA:  Shortness of breath  EXAM: PORTABLE CHEST - 1 VIEW  COMPARISON:  02/26/2015  FINDINGS: Cardiac shadow is stable. Bibasilar atelectatic changes are again noted. Increased vascular congestion with interstitial edema is now seen consistent with mild CHF. No bony abnormality is noted.  IMPRESSION: New changes of CHF.   Electronically Signed   By: Inez Catalina M.D.   On: 03/01/2015 08:31   I have reviewed his recent hospital notes, our office notes, his past history, labs and recent imaging including the renal US films.    Assessment: He has urosepsis with urinary retention.     Gross hematuria that is probably secondary to the infection and his prior radiation therapy and aggravated by Lovenox.    Plan: I would recommend changing the foley to a 26fr to allow more effect irrigation of clots.  I have ordered this.  I would recommend stopping the lovenox and using mechanical VTE prophylaxis until the hematuria resolves.     I don't think a voiding trial will be in order until his mental status and ambulatory status improves and if he fails a trial in the future, we will need to consider a suprapubic tube.   The oxybutynin can aggravate delirium/dementia.   It my be worthwhile to consider Myrbetriq 25mg  as an alternative but I will defer to the medical staff.   CC: Dr. Lala Lund.     LOS: 3 days    Jerrel Tiberio J 03/01/2015

## 2015-03-01 NOTE — Progress Notes (Signed)
PT Cancellation Note  Patient Details Name: TAKEEM KROTZER MRN: [id number] DOB: March 20, 1931   Cancelled Treatment:    Reason Eval/Treat Not Completed: Patient's level of consciousness.  Pt is sleeping very soundly and is unable to arouse.  Wife states that he has been like this all morning.  We will try to check on him later today.   Demetrios Isaacs L 03/01/2015, 11:58 AM

## 2015-03-02 LAB — GLUCOSE, CAPILLARY
GLUCOSE-CAPILLARY: 119 mg/dL — AB (ref 70–99)
GLUCOSE-CAPILLARY: 172 mg/dL — AB (ref 70–99)
GLUCOSE-CAPILLARY: 152 mg/dL — AB (ref 70–99)
Glucose-Capillary: 157 mg/dL — ABNORMAL HIGH (ref 70–99)

## 2015-03-02 LAB — CBC
HEMATOCRIT: 25.2 % — AB (ref 39.0–52.0)
Hemoglobin: 8.1 g/dL — ABNORMAL LOW (ref 13.0–17.0)
MCH: 28.6 pg (ref 26.0–34.0)
MCHC: 32.1 g/dL (ref 30.0–36.0)
MCV: 89 fL (ref 78.0–100.0)
Platelets: 128 10*3/uL — ABNORMAL LOW (ref 150–400)
RBC: 2.83 MIL/uL — ABNORMAL LOW (ref 4.22–5.81)
RDW: 14.3 % (ref 11.5–15.5)
WBC: 7.5 10*3/uL (ref 4.0–10.5)

## 2015-03-02 LAB — BASIC METABOLIC PANEL
Anion gap: 7 (ref 5–15)
BUN: 47 mg/dL — ABNORMAL HIGH (ref 6–23)
CO2: 23 mmol/L (ref 19–32)
Calcium: 7.9 mg/dL — ABNORMAL LOW (ref 8.4–10.5)
Chloride: 109 mmol/L (ref 96–112)
Creatinine, Ser: 3.05 mg/dL — ABNORMAL HIGH (ref 0.50–1.35)
GFR calc Af Amer: 21 mL/min — ABNORMAL LOW (ref >90)
GFR, EST NON AFRICAN AMERICAN: 18 mL/min — AB (ref >90)
GLUCOSE: 124 mg/dL — AB (ref 70–99)
Potassium: 4.1 mmol/L (ref 3.5–5.1)
Sodium: 139 mmol/L (ref 135–145)

## 2015-03-02 LAB — SODIUM, URINE, RANDOM: SODIUM UR: 127 mmol/L

## 2015-03-02 LAB — OSMOLALITY, URINE: OSMOLALITY UR: 329 mosm/kg — AB (ref 390–1090)

## 2015-03-02 LAB — CREATININE, URINE, RANDOM: Creatinine, Urine: 26.53 mg/dL

## 2015-03-02 MED ORDER — SODIUM CHLORIDE 0.9 % IV SOLN
INTRAVENOUS | Status: DC
  Administered 2015-03-02 – 2015-03-07 (×9): via INTRAVENOUS

## 2015-03-02 MED ORDER — METOCLOPRAMIDE HCL 10 MG PO TABS
5.0000 mg | ORAL_TABLET | Freq: Three times a day (TID) | ORAL | Status: DC
  Administered 2015-03-02 – 2015-03-09 (×22): 5 mg via ORAL
  Filled 2015-03-02 (×22): qty 1

## 2015-03-02 NOTE — Progress Notes (Signed)
At 1100 Foley irrigated with 100 mls NS return multiple small clots.  Foley catheter remained patient without leaking until 4:30pm. Foley Irrigated again with 100 mls NS return multiple small clots.

## 2015-03-02 NOTE — Progress Notes (Signed)
Patient Demographics  Douglas [name], is a 81 y.o. male, DOB - 07/23/1931, ZMO:[id number]  Admit date - 02/26/2015   Admitting Physician [name], MD  Outpatient Primary MD for the patient is [name],[name], MD  LOS - 4   Chief Complaint  Patient presents with  . Emesis  . Altered Mental Status      Brief summary  81 year old pleasant gentleman with recent right distal femur fracture required treatment at [facility] by Dr. Edmonia Lynch with internal fixation, urinary retention requiring Foley catheter placement by [facility], diabetes mellitus, essential hypertension, generalized deconditioning, and kidney disease stage IV Baseline creatinine around 2.5, who was admitted to the hospital with sepsis due to UTI and toxic encephalopathy along with acute renal failure. His Foley catheter was switched by urology 2, he has been placed on appropriate IV antibiotic with good results. Mentation improved. Sepsis resolved clinically. Acute renal failure is improving.  Despite appropriate treatment. He continues to appear white frail and chronically deconditioned, I had a detailed discussion with the wife, he is now partial code with no intubation, she is open for palliative care/hospice if he shows no improvement in 2-3 days.   Subjective:   Douglas [name] today has, No headache, No chest pain, No abdominal pain - No Nausea, No new weakness tingling or numbness, No Cough - SOB   Assessment & Plan    1.Sepsis due to UTI - has indwelling Foley, on Flomax, has responded very well to IV Maxipime which will be continued, blood pressure stable now afebrile and leukocytosis improving. Foley catheter was initially changed in the ER. Urology on board and they changed Foley catheter on 03/01/2015  again, renal ultrasound stable. So far cultures have been negative.    2. Recent right femur fracture. Discussed with Dr. [name] reviewed his x-ray and case with Dr. [name], per Dr. [name] okay to bear weight as tolerated on right leg and initiate PT. Follow with him post discharge.     3.DM2 - as his oral intake inconsistent continue only on sliding scale only holding Levimir  Lab Results  Component Value Date   HGBA1C 8.0* 02/05/2015    CBG (last 3)   Recent Labs  03/01/15 1637 03/01/15 2210 03/02/15 0756  GLUCAP 122* 114* 119*     4. Dyslipidemia. Continue statin.     5.GERD - PPI.    6. Acute renal failure on chronic kidney disease stage IV. Baseline creatinine appears to be around 2.5.  secondary to sepsis, hydrate, stable renal ultrasound also consulted nephrology.   After initial IV fluids he developed some wheezing and pulmonary congestion on chest x-ray on 03/01/2015 requiring stopping of IV fluids and gentle Lasix.    7. Diabetic gastroparesis.pre meal Reglan to be resumed     8. Delirium. Due to advanced age, infection acute renal failure. Supportive care. Address for aspiration of fall. Speech therapy requested to monitor. Continue on dysphagia 3 diet.    9. Mild wheezing and pulmonary congestion on chest x-ray. With mild acute on chronic diastolic CHF last EF 03% on echogram. Holding IV fluids, gentle IV Lasix one time on 03/01/2015, wheezing improved, continue beta blocker and monitor.      Code Status: Full  Family Communication: wife at bedside in detail.   Despite appropriate treatment. He continues to appear white frail and chronically deconditioned, I had a detailed discussion with the wife, he is now partial code with no intubation, she is open for palliative care/hospice if he shows no improvement in 2-3 days.  Disposition Plan: TBD   Procedures    Renal US - non acute   Consults  urology Dr. [name],  Renal   Medications  Scheduled Meds: . atorvastatin  40 mg Oral Daily  . ceFEPime (MAXIPIME) IV  1 g Intravenous Q24H  . enoxaparin (LOVENOX) injection  30 mg Subcutaneous Q24H  . gabapentin  300 mg Oral QHS  . insulin aspart  0-9 Units Subcutaneous TID WC  . ketorolac  1 drop Both Eyes TID  . metoprolol tartrate  25 mg Oral BID  . pantoprazole  40 mg Oral Daily  . polyethylene glycol  17 g Oral BID  . tamsulosin  0.4 mg Oral Daily   Continuous Infusions:   PRN Meds:.acetaminophen, albuterol, ondansetron **OR** ondansetron (ZOFRAN) IV, oxybutynin  DVT Prophylaxis  Lovenox    Lab Results  Component Value Date   PLT 128* 03/02/2015    Antibiotics     Anti-infectives    Start     Dose/Rate Route Frequency Ordered Stop   02/27/15 1800  ceFEPIme (MAXIPIME) 1 g in dextrose 5 % 50 mL IVPB     1 g 100 mL/hr over 30 Minutes Intravenous Every 24 hours 02/26/15 1729     02/26/15 1730  ceFEPIme (MAXIPIME) 2 g in dextrose 5 % 50 mL IVPB     2 g 100 mL/hr over 30 Minutes Intravenous  Once 02/26/15 1716 02/26/15 1822          Objective:   Filed Vitals:   02/28/15 2206 03/01/15 0558 03/01/15 1607 03/01/15 2331  BP: 122/73 128/77 118/52 146/63  Pulse: 96 116 100 93  Temp: 98.7 F (37.1 C) 99.8 F (37.7 C) 97.6 F (36.4 C)   TempSrc: Oral Oral Oral   Resp: 20 20 20    Height:      Weight:      SpO2: 93% 92% 96%     Wt Readings from Last 3 Encounters:  02/28/15 64 kg (141 lb 1.5 oz)  02/02/15 62.46 kg (137 lb 11.2 oz)  01/17/15 65.318 kg (144 lb)     Intake/Output Summary (Last 24 hours) at 03/02/15 0859 Last data filed at 03/01/15 2100  Gross per 24 hour  Intake    140 ml  Output   1020 ml  Net   -880 ml     Physical Exam  Awake , Alert, Oriented X 2, some fine tremors, No new F.N deficits, Normal affect [place].AT,PERRAL Supple Neck,No JVD, No cervical lymphadenopathy appriciated.  Symmetrical Chest wall movement, Good air movement bilaterally, CTAB RRR,No  Gallops,Rubs or new Murmurs, No Parasternal Heave +ve B.Sounds, Abd Soft, No tenderness, No organomegaly appriciated, No rebound - guarding or rigidity. No Cyanosis, Clubbing or edema, No new Rash or bruise , foley in place   Data Review   Micro Results Recent Results (from the past 240 hour(s))  Blood Culture (routine x 2)     Status: None (Preliminary result)   Collection Time: 02/26/15  5:24 PM  Result Value Ref Range Status   Specimen Description BLOOD LEFT ANTECUBITAL  Final   Special Requests BOTTLES DRAWN AEROBIC AND ANAEROBIC 8CC  Final   Culture NO GROWTH 3 DAYS  Final  Report Status PENDING  Incomplete  Blood Culture (routine x 2)     Status: None (Preliminary result)   Collection Time: 02/26/15  5:30 PM  Result Value Ref Range Status   Specimen Description BLOOD RIGHT ANTECUBITAL  Final   Special Requests BOTTLES DRAWN AEROBIC AND ANAEROBIC 6CC  Final   Culture NO GROWTH 3 DAYS  Final   Report Status PENDING  Incomplete  Urine culture     Status: None   Collection Time: 02/26/15  5:40 PM  Result Value Ref Range Status   Specimen Description URINE, CATHETERIZED  Final   Special Requests NONE  Final   Colony Count NO GROWTH Performed at [facility]   Final   Culture NO GROWTH Performed at [facility]   Final   Report Status 02/27/2015 FINAL  Final  Urine culture     Status: None   Collection Time: 02/26/15 10:58 PM  Result Value Ref Range Status   Specimen Description URINE, CLEAN CATCH  Final   Special Requests NONE  Final   Colony Count NO GROWTH Performed at [facility]   Final   Culture NO GROWTH Performed at [facility]   Final   Report Status 02/28/2015 FINAL  Final    Radiology Reports    Dg Chest Port 1 View  02/26/2015   CLINICAL DATA:  Altered mental status and emesis.  EXAM: PORTABLE CHEST - 1 VIEW  COMPARISON:  02/05/2015  FINDINGS: Chronic elevation of the right hemidiaphragm. Linear densities at lung bases  are suggestive for atelectasis. Upper lungs are clear. Heart size is normal. No focal airspace disease.  IMPRESSION: Bibasilar atelectasis.   Electronically Signed   By: Markus Daft M.D.   On: 02/26/2015 17:44        CBC  Recent Labs Lab 02/26/15 1725 02/27/15 0537 02/28/15 0505 03/02/15 0614  WBC 9.5 7.4 7.6 7.5  HGB 9.6* 8.7* 8.0* 8.1*  HCT 29.5* 27.8* 24.8* 25.2*  PLT 146* 123* 124* 128*  MCV 89.4 91.1 90.2 89.0  MCH 29.1 28.5 29.1 28.6  MCHC 32.5 31.3 32.3 32.1  RDW 14.0 14.4 14.5 14.3  LYMPHSABS 0.7  --   --   --   MONOABS 0.5  --   --   --   EOSABS 0.3  --   --   --   BASOSABS 0.1  --   --   --     Chemistries   Recent Labs Lab 02/26/15 1725 02/27/15 0537 02/27/15 1328 02/28/15 0505 03/01/15 0643 03/02/15 0614  NA 137 142  --  140 140 139  K 4.9 5.7* 4.8 4.0 4.4 4.1  CL 106 116*  --  113* 112 109  CO2 18* 20  --  19 20 23   GLUCOSE 264* 147*  --  89 132* 124*  BUN 50* 50*  --  43* 45* 47*  CREATININE 3.03* 3.02*  --  2.94* 3.11* 3.05*  CALCIUM 8.9 8.1*  --  7.8* 7.8* 7.9*  AST 19  --   --   --   --   --   ALT 11  --   --   --   --   --   ALKPHOS 104  --   --   --   --   --   BILITOT 0.4  --   --   --   --   --    ------------------------------------------------------------------------------------------------------------------ estimated creatinine clearance is 17.2 mL/min (by C-G  formula based on Cr of 3.05). ------------------------------------------------------------------------------------------------------------------ No results for input(s): HGBA1C in the last 72 hours. ------------------------------------------------------------------------------------------------------------------ No results for input(s): CHOL, HDL, LDLCALC, TRIG, CHOLHDL, LDLDIRECT in the last 72 hours. ------------------------------------------------------------------------------------------------------------------ No results for input(s): TSH, T4TOTAL, T3FREE, THYROIDAB in the  last 72 hours.  Invalid input(s): FREET3 ------------------------------------------------------------------------------------------------------------------ No results for input(s): VITAMINB12, FOLATE, FERRITIN, TIBC, IRON, RETICCTPCT in the last 72 hours.  Coagulation profile No results for input(s): INR, PROTIME in the last 168 hours.  No results for input(s): DDIMER in the last 72 hours.  Cardiac Enzymes  Recent Labs Lab 02/26/15 1725  TROPONINI <0.03   ------------------------------------------------------------------------------------------------------------------ Invalid input(s): POCBNP     Time Spent in minutes   35   SINGH,PRASHANT K M.D on 03/02/2015 at 8:59 AM  Between 7am to 7pm - Pager - [phone]  After 7pm go to [email or web address] - password [facility]  [name]   Office  [phone]

## 2015-03-02 NOTE — Progress Notes (Signed)
SCD's not in use, orthopedic boot to right leg. Foley catheter irrigated, no blood clots returned.  Leakage around catheter continues, increased when patient on his side. Wife in room and involved in patient's care.

## 2015-03-02 NOTE — Consult Note (Signed)
Reason for Consult: Acute kidney injury superimposed on chronic Referring Physician: Dr.Singh  Douglas [name] is an 81 y.o. male.  HPI: He is a patient was history of hypertension, chronic renal failure stage IV, prostate cancer, kidney stone in with history of obstructive uropathy and recurrent urinary tract infection presently brought by family is because of fever, chills and intermittent matured area. Presently patient seems to be feeling better. Her family's states that his hematuria has improved but patient seems to be leaking urine around his catheter site after a new Foley catheter insertion yesterday. Patient still has some cough but no sputum production. He has intermittent difficulty breathing which is not getting worst after his last admission for pneumonia. His appetite is okay and start getting since he came to the hospital. He doesn't have any nausea or vomiting now.  Past Medical History  Diagnosis Date  . Hypertension   . Diabetes mellitus   . Hyperlipidemia   . Cataracts, bilateral   . Hyperkalemia   . UTI (lower urinary tract infection)   . Calculus of kidney   . Sensorineural hearing loss, unspecified   . Epiglottitis   . Chronic ankle pain   . Neuromuscular disorder     neurophaty lower legs  . Upper GI bleeding     while at [facility] 12/2014  . Prostate cancer 2009    radiation  . Confusion   . Gastroparesis due to DM: DELAYED GASTRIC EMPTYING STUDY 02/08/15 02/08/2015    Past Surgical History  Procedure Laterality Date  . Eye surgery  sept. 2015    cataract surgery  . Hernia repair      severa; years can't remeber  . Femur im nail Right 01/18/2015    Procedure: INTRAMEDULLARY (IM) RETROGRADE FEMORAL NAILING;  Surgeon: Renette Butters, MD;  Location: [place];  Service: Orthopedics;  Laterality: Right;    History reviewed. No pertinent family history.  Social History:  reports that he has never smoked. He does not have any smokeless tobacco history on file. He  reports that he does not drink alcohol or use illicit drugs.  Allergies: No Known Allergies  Medications: I have reviewed the patient's current medications.  Results for orders placed or performed during the hospital encounter of 02/26/15 (from the past 48 hour(s))  Glucose, capillary     Status: Abnormal   Collection Time: 02/28/15 11:21 AM  Result Value Ref Range   Glucose-Capillary 69 (L) 70 - 99 mg/dL   Comment 1 Notify RN   Glucose, capillary     Status: Abnormal   Collection Time: 02/28/15  4:15 PM  Result Value Ref Range   Glucose-Capillary 119 (H) 70 - 99 mg/dL   Comment 1 Notify RN    Comment 2 Document in Chart   Basic metabolic panel     Status: Abnormal   Collection Time: 03/01/15  6:43 AM  Result Value Ref Range   Sodium 140 135 - 145 mmol/L   Potassium 4.4 3.5 - 5.1 mmol/L   Chloride 112 96 - 112 mmol/L   CO2 20 19 - 32 mmol/L   Glucose, Bld 132 (H) 70 - 99 mg/dL   BUN 45 (H) 6 - 23 mg/dL   Creatinine, Ser 3.11 (H) 0.50 - 1.35 mg/dL   Calcium 7.8 (L) 8.4 - 10.5 mg/dL   GFR calc non Af Amer 17 (L) >90 mL/min   GFR calc Af Amer 20 (L) >90 mL/min    Comment: (NOTE) The eGFR has been calculated  using the CKD EPI equation. This calculation has not been validated in all clinical situations. eGFR's persistently <90 mL/min signify possible Chronic Kidney Disease.    Anion gap 8 5 - 15  Glucose, capillary     Status: Abnormal   Collection Time: 03/01/15  7:42 AM  Result Value Ref Range   Glucose-Capillary 125 (H) 70 - 99 mg/dL  Osmolality     Status: Abnormal   Collection Time: 03/01/15 10:18 AM  Result Value Ref Range   Osmolality 302 (H) 275 - 300 mOsm/kg    Comment: Performed at [facility]  Glucose, capillary     Status: Abnormal   Collection Time: 03/01/15 11:22 AM  Result Value Ref Range   Glucose-Capillary 103 (H) 70 - 99 mg/dL  Glucose, capillary     Status: Abnormal   Collection Time: 03/01/15  4:37 PM  Result Value Ref Range    Glucose-Capillary 122 (H) 70 - 99 mg/dL  Glucose, capillary     Status: Abnormal   Collection Time: 03/01/15 10:10 PM  Result Value Ref Range   Glucose-Capillary 114 (H) 70 - 99 mg/dL   Comment 1 Notify RN    Comment 2 Document in Chart   Creatinine, urine, random     Status: None   Collection Time: 03/01/15 11:55 PM  Result Value Ref Range   Creatinine, Urine 26.53 mg/dL  Sodium, urine, random     Status: None   Collection Time: 03/01/15 11:55 PM  Result Value Ref Range   Sodium, Ur 127 mmol/L  CBC     Status: Abnormal   Collection Time: 03/02/15  6:14 AM  Result Value Ref Range   WBC 7.5 4.0 - 10.5 K/uL   RBC 2.83 (L) 4.22 - 5.81 MIL/uL   Hemoglobin 8.1 (L) 13.0 - 17.0 g/dL   HCT 25.2 (L) 39.0 - 52.0 %   MCV 89.0 78.0 - 100.0 fL   MCH 28.6 26.0 - 34.0 pg   MCHC 32.1 30.0 - 36.0 g/dL   RDW 14.3 11.5 - 15.5 %   Platelets 128 (L) 150 - 400 K/uL  Basic metabolic panel     Status: Abnormal   Collection Time: 03/02/15  6:14 AM  Result Value Ref Range   Sodium 139 135 - 145 mmol/L   Potassium 4.1 3.5 - 5.1 mmol/L   Chloride 109 96 - 112 mmol/L   CO2 23 19 - 32 mmol/L   Glucose, Bld 124 (H) 70 - 99 mg/dL   BUN 47 (H) 6 - 23 mg/dL   Creatinine, Ser 3.05 (H) 0.50 - 1.35 mg/dL   Calcium 7.9 (L) 8.4 - 10.5 mg/dL   GFR calc non Af Amer 18 (L) >90 mL/min   GFR calc Af Amer 21 (L) >90 mL/min    Comment: (NOTE) The eGFR has been calculated using the CKD EPI equation. This calculation has not been validated in all clinical situations. eGFR's persistently <90 mL/min signify possible Chronic Kidney Disease.    Anion gap 7 5 - 15  Glucose, capillary     Status: Abnormal   Collection Time: 03/02/15  7:56 AM  Result Value Ref Range   Glucose-Capillary 119 (H) 70 - 99 mg/dL   Comment 1 Notify RN     Dg Chest Port 1 View  03/01/2015   CLINICAL DATA:  Shortness of breath  EXAM: PORTABLE CHEST - 1 VIEW  COMPARISON:  02/26/2015  FINDINGS: Cardiac shadow is stable. Bibasilar atelectatic  changes are again noted. Increased  vascular congestion with interstitial edema is now seen consistent with mild CHF. No bony abnormality is noted.  IMPRESSION: New changes of CHF.   Electronically Signed   By: Inez Catalina M.D.   On: 03/01/2015 08:31    Review of Systems  Constitutional: Positive for fever and chills.  Respiratory: Positive for cough, shortness of breath and wheezing.   Gastrointestinal: Positive for vomiting and diarrhea. Negative for nausea.       Poor appetite  Genitourinary: Positive for hematuria.  Neurological: Positive for weakness.   Blood pressure 146/63, pulse 93, temperature 97.6 F (36.4 C), temperature source Oral, resp. rate 20, height _0  (1.702 m), weight 64 kg (141 lb 1.5 oz), SpO2 96 %. Physical Exam  Constitutional: No distress.  Eyes: No scleral icterus.  Neck: No JVD present.  Cardiovascular: Normal rate and regular rhythm.   Respiratory: No respiratory distress. He has no wheezes.  GI: He exhibits no distension. There is no tenderness.  Musculoskeletal: He exhibits no edema.    Assessment/Plan: Problem #1 acute kidney injury superimposed on chronic. Possibly from obstructive uropathy. His renal function at this moment seems to be stable. However his output cannot be measured accurately because of leak around the catheter. Problem #2 chronic renal failure: Stage IV. Patient had renal failure for a long period and his last creatinine was about 2.59 with a GFR of 22 mL/m. The etiology for his renal failure was thought to be secondary focal segmental atherosclerosis at associated with kidney stone and obstructive uropathy/hypertension/diabetes. Problem #3 history of hypertension: His blood pressure is reasonably controlled Problem #4 history of diabetes Problem #5 history of recurrent uterine tract infection and hematuria: Patient presently being followed by urology. At this moment he doesn't have any bleeding but patient seems to have urine leak  around the catheter. Problem #6 history of prostate cancer: His status post radiation long time ago and presently no history of recurrence.  Problem #7 anemia Problem #8 secondary hyperparathyroidism Plan: At this moment his Foley catheter may need to be changed. We'll start patient on normal saline at 75 mL per hour We'll check his basic metabolic panel and phosphorus in the morning. We'll start patient on calcitriol 0.25 g by mouth once a day.  Ezeriah Luty S 03/02/2015, 10:10 AM

## 2015-03-03 LAB — GLUCOSE, CAPILLARY
GLUCOSE-CAPILLARY: 147 mg/dL — AB (ref 70–99)
GLUCOSE-CAPILLARY: 196 mg/dL — AB (ref 70–99)
Glucose-Capillary: 173 mg/dL — ABNORMAL HIGH (ref 70–99)
Glucose-Capillary: 164 mg/dL — ABNORMAL HIGH (ref 70–99)

## 2015-03-03 LAB — BASIC METABOLIC PANEL
ANION GAP: 8 (ref 5–15)
BUN: 50 mg/dL — AB (ref 6–23)
CALCIUM: 7.7 mg/dL — AB (ref 8.4–10.5)
CO2: 22 mmol/L (ref 19–32)
Chloride: 111 mmol/L (ref 96–112)
Creatinine, Ser: 2.81 mg/dL — ABNORMAL HIGH (ref 0.50–1.35)
GFR calc Af Amer: 23 mL/min — ABNORMAL LOW (ref >90)
GFR, EST NON AFRICAN AMERICAN: 20 mL/min — AB (ref >90)
Glucose, Bld: 157 mg/dL — ABNORMAL HIGH (ref 70–99)
POTASSIUM: 4.1 mmol/L (ref 3.5–5.1)
Sodium: 141 mmol/L (ref 135–145)

## 2015-03-03 LAB — CULTURE, BLOOD (ROUTINE X 2)
Culture: NO GROWTH
Culture: NO GROWTH

## 2015-03-03 LAB — CBC
HEMATOCRIT: 24.3 % — AB (ref 39.0–52.0)
Hemoglobin: 7.8 g/dL — ABNORMAL LOW (ref 13.0–17.0)
MCH: 28.6 pg (ref 26.0–34.0)
MCHC: 32.1 g/dL (ref 30.0–36.0)
MCV: 89 fL (ref 78.0–100.0)
Platelets: 131 10*3/uL — ABNORMAL LOW (ref 150–400)
RBC: 2.73 MIL/uL — ABNORMAL LOW (ref 4.22–5.81)
RDW: 14.3 % (ref 11.5–15.5)
WBC: 6.9 10*3/uL (ref 4.0–10.5)

## 2015-03-03 LAB — PHOSPHORUS: Phosphorus: 3.5 mg/dL (ref 2.3–4.6)

## 2015-03-03 MED ORDER — FUROSEMIDE 10 MG/ML IJ SOLN
40.0000 mg | Freq: Two times a day (BID) | INTRAMUSCULAR | Status: DC
  Administered 2015-03-03 – 2015-03-07 (×8): 40 mg via INTRAVENOUS
  Filled 2015-03-03 (×8): qty 4

## 2015-03-03 NOTE — Progress Notes (Signed)
Foley flushed with 100 mls normal saline twice this shift. 100 mls returned and wasted Many small clots noted foley output remains tea colored

## 2015-03-03 NOTE — Progress Notes (Signed)
[name] PROGRESS NOTE  Douglas [name] MOQ:[id number] DOB: 1931-05-13 DOA: 02/26/2015 PCP: [name]., MD  Assessment/Plan: Sepsis 2/2 UTI -Sepsis parameters improved. -Continue cefepime. -Cx with no growth. -Can transition to PO antibiotics in am if remains afebrile.  Chronic Urinary Retention -Continue foley/flomax/oxybutinin. -Appreciate GU follow up. -Foley has been exchanged twice this admission due to clogging from blood clots. -May need suprapubic catheters in the future.  Acute on CKD Stage IV -With obstructive uropathy. -Cr continues to improve. -Appreciate renal and GU following.  Diabetic Gastroparesis -On reglan. -No n/v.  Acute Metabolic Encephalopathy -Resolved. -Secondary to sepsis picture.  Recent Right Femur Fracture -Resume PT in am. -WBAT per Dr. [name].  Code Status: Partial  Family Communication: Wife and daughter at bedside updated on plan of care  Disposition Plan: To be determined   Consultants:  Renal  GU   Antibiotics:  Cefepime   Subjective: No complaints today.  Objective: Filed Vitals:   03/02/15 1605 03/02/15 2245 03/03/15 0500 03/03/15 0640  BP: 105/36 118/58  108/62  Pulse: 68 72  88  Temp: 98.9 F (37.2 C) 97.9 F (36.6 C)  98.1 F (36.7 C)  TempSrc: Oral Oral  Oral  Resp: 20 20  20   Height:      Weight:   63.2 kg (139 lb 5.3 oz)   SpO2: 95% 94%  93%    Intake/Output Summary (Last 24 hours) at 03/03/15 1610 Last data filed at 03/03/15 0641  Gross per 24 hour  Intake 1513.75 ml  Output    500 ml  Net 1013.75 ml   Filed Weights   02/27/15 0500 02/28/15 0500 03/03/15 0500  Weight: 63 kg (138 lb 14.2 oz) 64 kg (141 lb 1.5 oz) 63.2 kg (139 lb 5.3 oz)    Exam:   General:  AA Ox3  Cardiovascular: RRR  Respiratory: CTA B  Abdomen: S/NT/ND/+BS  Extremities: no C/C/E   Neurologic:  Non-focal  Data Reviewed: Basic Metabolic Panel:  Recent Labs Lab 02/27/15 0537 02/27/15 1328  02/28/15 0505 03/01/15 0643 03/02/15 0614 03/03/15 0617  NA 142  --  140 140 139 141  K 5.7* 4.8 4.0 4.4 4.1 4.1  CL 116*  --  113* 112 109 111  CO2 20  --  19 20 23 22   GLUCOSE 147*  --  89 132* 124* 157*  BUN 50*  --  43* 45* 47* 50*  CREATININE 3.02*  --  2.94* 3.11* 3.05* 2.81*  CALCIUM 8.1*  --  7.8* 7.8* 7.9* 7.7*  PHOS  --   --   --   --   --  3.5   Liver Function Tests:  Recent Labs Lab 02/26/15 1725  AST 19  ALT 11  ALKPHOS 104  BILITOT 0.4  PROT 7.4  ALBUMIN 3.4*   No results for input(s): LIPASE, AMYLASE in the last 168 hours. No results for input(s): AMMONIA in the last 168 hours. CBC:  Recent Labs Lab 02/26/15 1725 02/27/15 0537 02/28/15 0505 03/02/15 0614 03/03/15 0617  WBC 9.5 7.4 7.6 7.5 6.9  NEUTROABS 8.0*  --   --   --   --   HGB 9.6* 8.7* 8.0* 8.1* 7.8*  HCT 29.5* 27.8* 24.8* 25.2* 24.3*  MCV 89.4 91.1 90.2 89.0 89.0  PLT 146* 123* 124* 128* 131*   Cardiac Enzymes:  Recent Labs Lab 02/26/15 1725  TROPONINI <0.03   BNP (last 3 results)  Recent Labs  02/26/15 1726  BNP 187.0*  ProBNP (last 3 results) No results for input(s): PROBNP in the last 8760 hours.  CBG:  Recent Labs Lab 03/02/15 1150 03/02/15 1642 03/02/15 2054 03/03/15 0722 03/03/15 1133  GLUCAP 157* 172* 152* 147* 173*    Recent Results (from the past 240 hour(s))  Blood Culture (routine x 2)     Status: None   Collection Time: 02/26/15  5:24 PM  Result Value Ref Range Status   Specimen Description BLOOD LEFT ANTECUBITAL  Final   Special Requests BOTTLES DRAWN AEROBIC AND ANAEROBIC 8CC  Final   Culture NO GROWTH 5 DAYS  Final   Report Status 03/03/2015 FINAL  Final  Blood Culture (routine x 2)     Status: None   Collection Time: 02/26/15  5:30 PM  Result Value Ref Range Status   Specimen Description BLOOD RIGHT ANTECUBITAL  Final   Special Requests BOTTLES DRAWN AEROBIC AND ANAEROBIC 6CC  Final   Culture NO GROWTH 5 DAYS  Final   Report Status  03/03/2015 FINAL  Final  Urine culture     Status: None   Collection Time: 02/26/15  5:40 PM  Result Value Ref Range Status   Specimen Description URINE, CATHETERIZED  Final   Special Requests NONE  Final   Colony Count NO GROWTH Performed at [facility]   Final   Culture NO GROWTH Performed at [facility]   Final   Report Status 02/27/2015 FINAL  Final  Urine culture     Status: None   Collection Time: 02/26/15 10:58 PM  Result Value Ref Range Status   Specimen Description URINE, CLEAN CATCH  Final   Special Requests NONE  Final   Colony Count NO GROWTH Performed at [facility]   Final   Culture NO GROWTH Performed at [facility]   Final   Report Status 02/28/2015 FINAL  Final     Studies: No results found.  Scheduled Meds: . atorvastatin  40 mg Oral Daily  . ceFEPime (MAXIPIME) IV  1 g Intravenous Q24H  . enoxaparin (LOVENOX) injection  30 mg Subcutaneous Q24H  . furosemide  40 mg Intravenous BID  . gabapentin  300 mg Oral QHS  . insulin aspart  0-9 Units Subcutaneous TID WC  . ketorolac  1 drop Both Eyes TID  . metoCLOPramide  5 mg Oral TID AC  . metoprolol tartrate  25 mg Oral BID  . pantoprazole  40 mg Oral Daily  . polyethylene glycol  17 g Oral BID  . tamsulosin  0.4 mg Oral Daily   Continuous Infusions: . sodium chloride 75 mL/hr at 03/03/15 1028    Principal Problem:   Sepsis secondary to UTI Active Problems:   Acute renal failure   Tachycardia   Altered mental state   CKD (chronic kidney disease) stage 4, GFR 15-29 ml/min   Anemia, chronic disease   Encephalopathy   Diabetes mellitus, controlled   Urinary tract infectious disease   Urinary retention   Gastroparesis due to DM: DELAYED GASTRIC EMPTYING STUDY 02/08/15    Time spent: 35 minutes. Greater than 50% of this time was spent in direct contact with the patient coordinating care.    Lelon Frohlich  [name] Pager [phone]  If  7PM-7AM, please contact night-coverage at [email or web address], password [facility] 03/03/2015, 4:10 PM  LOS: 5 days

## 2015-03-03 NOTE — Progress Notes (Signed)
Subjective: Interval History: has no complaint of nausea or vomiting. Patient stated that he is feeling much better. Presently he denies any difficulty in breathing..  Objective: Vital signs in last 24 hours: Temp:  [97.9 F (36.6 C)-98.9 F (37.2 C)] 98.1 F (36.7 C) (03/06 0640) Pulse Rate:  [68-92] 88 (03/06 0640) Resp:  [20] 20 (03/06 0640) BP: (105-132)/(36-62) 108/62 mmHg (03/06 0640) SpO2:  [93 %-95 %] 93 % (03/06 0640) Weight:  [63.2 kg (139 lb 5.3 oz)] 63.2 kg (139 lb 5.3 oz) (03/06 0500) Weight change:   Intake/Output from previous day: 03/05 0701 - 03/06 0700 In: 2213.8 [P.O.:600; I.V.:1363.8; IV Piggyback:50] Out: 600 [Urine:600] Intake/Output this shift:    General appearance: alert, cooperative and no distress Resp: clear to auscultation bilaterally Cardio: regular rate and rhythm, S1, S2 normal, no murmur, click, rub or gallop GI: soft, non-tender; bowel sounds normal; no masses,  no organomegaly Extremities: extremities normal, atraumatic, no cyanosis or edema  Lab Results:  Recent Labs  03/02/15 0614 03/03/15 0617  WBC 7.5 6.9  HGB 8.1* 7.8*  HCT 25.2* 24.3*  PLT 128* 131*   BMET:  Recent Labs  03/02/15 0614 03/03/15 0617  NA 139 141  K 4.1 4.1  CL 109 111  CO2 23 22  GLUCOSE 124* 157*  BUN 47* 50*  CREATININE 3.05* 2.81*  CALCIUM 7.9* 7.7*   No results for input(s): PTH in the last 72 hours. Iron Studies: No results for input(s): IRON, TIBC, TRANSFERRIN, FERRITIN in the last 72 hours.  Studies/Results: No results found.  I have reviewed the patient's current medications.  Assessment/Plan: Problem #1 acute kidney injury: His BUN and creatinine is progressively improving. His potassium is normal. Presently patient with obstructive uropathy requiring frequent flushing of his catheter with multiple blood clots coming out of it. Presently patient does not have any sign of fluid overload. Problem #2 anemia: Possibly iron deficiency  anemia Problem #3 history of chronic renal failure: Stage IV Problem #4 diabetes Problem #5 history of prostate cancer. Patient presently with Foley catheter. Problem #6 metabolic bone disease his calcium and his phosphorus is in range. Plan: We'll start patient on Lasix 40 mg IV twice a day We'll do iron studies, basic metabolic panel and CBC in the morning.    LOS: 5 days   Douglas [name] S 03/03/2015,9:29 AM

## 2015-03-04 ENCOUNTER — Encounter (INDEPENDENT_AMBULATORY_CARE_PROVIDER_SITE_OTHER): Payer: Medicare Other | Admitting: Ophthalmology

## 2015-03-04 DIAGNOSIS — R339 Retention of urine, unspecified: Secondary | ICD-10-CM

## 2015-03-04 LAB — GLUCOSE, CAPILLARY
Glucose-Capillary: 126 mg/dL — ABNORMAL HIGH (ref 70–99)
Glucose-Capillary: 139 mg/dL — ABNORMAL HIGH (ref 70–99)
Glucose-Capillary: 186 mg/dL — ABNORMAL HIGH (ref 70–99)
Glucose-Capillary: 223 mg/dL — ABNORMAL HIGH (ref 70–99)

## 2015-03-04 LAB — CBC
HCT: 24 % — ABNORMAL LOW (ref 39.0–52.0)
Hemoglobin: 7.8 g/dL — ABNORMAL LOW (ref 13.0–17.0)
MCH: 29 pg (ref 26.0–34.0)
MCHC: 32.5 g/dL (ref 30.0–36.0)
MCV: 89.2 fL (ref 78.0–100.0)
PLATELETS: 144 10*3/uL — AB (ref 150–400)
RBC: 2.69 MIL/uL — ABNORMAL LOW (ref 4.22–5.81)
RDW: 14.2 % (ref 11.5–15.5)
WBC: 6.8 10*3/uL (ref 4.0–10.5)

## 2015-03-04 LAB — BASIC METABOLIC PANEL
Anion gap: 7 (ref 5–15)
BUN: 45 mg/dL — AB (ref 6–23)
CHLORIDE: 112 mmol/L (ref 96–112)
CO2: 23 mmol/L (ref 19–32)
Calcium: 7.7 mg/dL — ABNORMAL LOW (ref 8.4–10.5)
Creatinine, Ser: 2.78 mg/dL — ABNORMAL HIGH (ref 0.50–1.35)
GFR calc Af Amer: 23 mL/min — ABNORMAL LOW (ref >90)
GFR calc non Af Amer: 20 mL/min — ABNORMAL LOW (ref >90)
GLUCOSE: 145 mg/dL — AB (ref 70–99)
Potassium: 4 mmol/L (ref 3.5–5.1)
Sodium: 142 mmol/L (ref 135–145)

## 2015-03-04 LAB — IRON AND TIBC
Iron: 30 ug/dL — ABNORMAL LOW (ref 42–165)
SATURATION RATIOS: 19 % — AB (ref 20–55)
TIBC: 155 ug/dL — AB (ref 215–435)
UIBC: 125 ug/dL (ref 125–400)

## 2015-03-04 LAB — FERRITIN: Ferritin: 1246 ng/mL — ABNORMAL HIGH (ref 22–322)

## 2015-03-04 LAB — CLOSTRIDIUM DIFFICILE BY PCR: Toxigenic C. Difficile by PCR: POSITIVE — AB

## 2015-03-04 MED ORDER — METRONIDAZOLE 500 MG PO TABS
500.0000 mg | ORAL_TABLET | Freq: Three times a day (TID) | ORAL | Status: DC
  Administered 2015-03-04 – 2015-03-09 (×12): 500 mg via ORAL
  Filled 2015-03-04 (×13): qty 1

## 2015-03-04 NOTE — Progress Notes (Signed)
Subjective: Interval History: has no complaint except the recurrent hematuria. His appetite is not that great but he doesn't have any nausea or vomiting. He said he is trying to force himself to eat. But overall he seems to be improving. At this moment patient does not have any cough or difficulty breathing.  Objective: Vital signs in last 24 hours: Temp:  [97.9 F (36.6 C)-98.5 F (36.9 C)] 98.2 F (36.8 C) (03/07 0620) Pulse Rate:  [82-84] 83 (03/07 0620) Resp:  [20] 20 (03/07 0620) BP: (101-118)/(63-73) 101/63 mmHg (03/07 0620) SpO2:  [94 %-95 %] 94 % (03/07 0620) Weight:  [62.5 kg (137 lb 12.6 oz)] 62.5 kg (137 lb 12.6 oz) (03/07 0621) Weight change: -0.7 kg (-1 lb 8.7 oz)  Intake/Output from previous day: 03/06 0701 - 03/07 0700 In: 2966.3 [P.O.:720; I.V.:1896.3; IV Piggyback:50] Out: 4400 [Urine:4400] Intake/Output this shift: Total I/O In: -  Out: 950 [Urine:950]  General appearance: alert, cooperative and no distress Resp: clear to auscultation bilaterally Cardio: regular rate and rhythm, S1, S2 normal, no murmur, click, rub or gallop GI: soft, non-tender; bowel sounds normal; no masses,  no organomegaly Extremities: extremities normal, atraumatic, no cyanosis or edema  Lab Results:  Recent Labs  03/03/15 0617 03/04/15 0616  WBC 6.9 6.8  HGB 7.8* 7.8*  HCT 24.3* 24.0*  PLT 131* 144*   BMET:   Recent Labs  03/03/15 0617 03/04/15 0616  NA 141 142  K 4.1 4.0  CL 111 112  CO2 22 23  GLUCOSE 157* 145*  BUN 50* 45*  CREATININE 2.81* 2.78*  CALCIUM 7.7* 7.7*   No results for input(s): PTH in the last 72 hours. Iron Studies: No results for input(s): IRON, TIBC, TRANSFERRIN, FERRITIN in the last 72 hours.  Studies/Results: No results found.  I have reviewed the patient's current medications.  Assessment/Plan: Problem #1 acute kidney injury: His BUN and creatinine is progressively improving. Patient had a good urine output. Presently his potassium is  within normal range. Problem #2 anemia: Possibly iron deficiency anemia Problem #3 history of chronic renal failure: Stage IV Problem #4 diabetes Problem #5 history of prostate cancer. Patient presently with Foley catheter.  Problem #6 metabolic bone disease his calcium and his phosphorus is in range. Plan: We'll continue his present management. We'll check his, basic metabolic panel  in the morning.    LOS: 6 days   Douglas [name] S 03/04/2015,11:51 AM

## 2015-03-04 NOTE — Progress Notes (Signed)
Physical Therapy Treatment Patient Details Name: Douglas [name] MRN: [id number] DOB: 09/25/1931 Today's Date: 03/04/2015    History of Present Illness 81 yo male with recent hip injury, hospitalization last month with foley since then diagnosed with uti yesterday started on cipro. Progressively worsening today with ams, high fever, shaking all over and vomiting nonbloody. Pt complains of no pain. He lives at home with wife. Previous urine cultures over the last couple of months reviewed, cipro appropriate. Repeat culture data sent in ED. Pt has been nauseas but no vomiting in the ED. Has received 2 liters of ivf, and sepsis protocol. Temp is still over 103. Unknown when last time catheter replaced. Suppose to have urology appt tomorrow for f/u of the indwelling catheter for urinary retention.    PT Comments    Pt was feeling quite well today with no c/o.   Unfortunately, his foley catheter has been leaking quite a bit and his bed was soaked at the time of my arrival.  With the assist of an aide, we were able to get the pt cleansed.  During this process, he was instructed in transfer to the EOB and in standing to the walker needing only min assist.  He was instructed in gait with the walker and he was able to ambulate 4' with min assist, WBAT on right.  He then was able to perform a few sitting exercises but began to have bowel incontinence and continued leaking of the foley.  RN and aide arrived to attend to these problems.  Pt should be able to progress to gait without much difficulty.  Right now, his major problems are with his bowel and bladder.  Follow Up Recommendations  Home health PT     Equipment Recommendations  None recommended by PT    Recommendations for Other Services OT consult     Precautions / Restrictions Precautions Precautions: Fall Required Braces or Orthoses: Knee Immobilizer - Right Knee Immobilizer - Right: On when out of bed or  walking Restrictions Weight Bearing Restrictions: Yes RLE Weight Bearing: Weight bearing as tolerated    Mobility  Bed Mobility Overal bed mobility: Needs Assistance       Supine to sit: Min guard;HOB elevated        Transfers Overall transfer level: Needs assistance Equipment used: Rolling walker (2 wheeled) Transfers: Sit to/from Stand Sit to Stand: Supervision            Ambulation/Gait Ambulation/Gait assistance: Min assist Ambulation Distance (Feet): 4 Feet Assistive device: Rolling walker (2 wheeled) Gait Pattern/deviations: Decreased stance time - right;Step-through pattern Gait velocity: appropriate for situation       Stairs            Wheelchair Mobility    Modified Rankin (Stroke Patients Only)       Balance     Sitting balance-Leahy Scale: Good     Standing balance support: Bilateral upper extremity supported Standing balance-Leahy Scale: Good                      Cognition Arousal/Alertness: Awake/alert Behavior During Therapy: WFL for tasks assessed/performed Overall Cognitive Status: Within Functional Limits for tasks assessed                      Exercises General Exercises - Lower Extremity Ankle Circles/Pumps: AROM;Both;10 reps;Seated Quad Sets: AROM;Both;Seated Heel Slides: AAROM;Right;5 reps;Seated    General Comments        Pertinent Vitals/Pain Pain Assessment:  No/denies pain    Home Living                      Prior Function            PT Goals (current goals can now be found in the care plan section) Progress towards PT goals: Progressing toward goals    Frequency  Min 3X/week    PT Plan Current plan remains appropriate    Co-evaluation             End of Session Equipment Utilized During Treatment: Gait belt;Right knee immobilizer;Oxygen Activity Tolerance: Patient tolerated treatment well Patient left: in chair;with call bell/phone within reach;with  nursing/sitter in room     Time: 1530-1613 PT Time Calculation (min) (ACUTE ONLY): 43 min  Charges:  $Gait Training: 8-22 mins $Therapeutic Exercise: 8-22 mins                    G Codes:      Sable Feil March 25, 2015, 4:26 PM

## 2015-03-04 NOTE — Progress Notes (Signed)
ANTIBIOTIC CONSULT NOTE  Pharmacy Consult for Cefepime Indication: Urosepsis  No Known Allergies  Patient Measurements: Height: 5\' 7"  (170.2 cm) Weight: 137 lb 12.6 oz (62.5 kg) IBW/kg (Calculated) : 66.1  Vital Signs: Temp: 98.2 F (36.8 C) (03/07 0620) Temp Source: Oral (03/07 0620) BP: 101/63 mmHg (03/07 0620) Pulse Rate: 83 (03/07 0620) Intake/Output from previous day: 03/06 0701 - 03/07 0700 In: 2966.3 [P.O.:720; I.V.:1896.3; IV Piggyback:50] Out: 4400 [Urine:4400] Intake/Output from this shift:    Labs:  Recent Labs  03/01/15 2355 03/02/15 0614 03/03/15 0617 03/04/15 0616  WBC  --  7.5 6.9 6.8  HGB  --  8.1* 7.8* 7.8*  PLT  --  128* 131* 144*  LABCREA 26.53  --   --   --   CREATININE  --  3.05* 2.81* 2.78*   Estimated Creatinine Clearance: 18.4 mL/min (by C-G formula based on Cr of 2.78). No results for input(s): VANCOTROUGH, VANCOPEAK, VANCORANDOM, GENTTROUGH, GENTPEAK, GENTRANDOM, TOBRATROUGH, TOBRAPEAK, TOBRARND, AMIKACINPEAK, AMIKACINTROU, AMIKACIN in the last 72 hours.   Microbiology: Recent Results (from the past 720 hour(s))  Urine culture     Status: None   Collection Time: 02/02/15  2:43 PM  Result Value Ref Range Status   Specimen Description URINE, CATHETERIZED  Final   Special Requests NONE  Final   Colony Count   Final    >=100,000 COLONIES/ML Performed at [facility]    Culture   Final    ENTEROCOCCUS SPECIES YEAST Performed at [facility]    Report Status 02/06/2015 FINAL  Final   Organism ID, Bacteria ENTEROCOCCUS SPECIES  Final      Susceptibility   Enterococcus species - MIC*    AMPICILLIN <=2 SENSITIVE Sensitive     LEVOFLOXACIN 1 SENSITIVE Sensitive     NITROFURANTOIN <=16 SENSITIVE Sensitive     VANCOMYCIN 2 SENSITIVE Sensitive     TETRACYCLINE >=16 RESISTANT Resistant     * ENTEROCOCCUS SPECIES  MRSA PCR Screening     Status: Abnormal   Collection Time: 02/02/15  7:47 PM  Result Value Ref Range Status    MRSA by PCR POSITIVE (A) NEGATIVE Final    Comment: CRITICAL RESULT CALLED TO, READ BACK BY AND VERIFIED WITH: ROGERS,A AT 10:10PM ON 02/02/15 BY FESTERMAN,C        The GeneXpert MRSA Assay (FDA approved for NASAL specimens only), is one component of a comprehensive MRSA colonization surveillance program. It is not intended to diagnose MRSA infection nor to guide or monitor treatment for MRSA infections.   Clostridium Difficile by PCR     Status: None   Collection Time: 02/08/15 11:02 AM  Result Value Ref Range Status   C difficile by pcr NEGATIVE NEGATIVE Final  Blood Culture (routine x 2)     Status: None   Collection Time: 02/26/15  5:24 PM  Result Value Ref Range Status   Specimen Description BLOOD LEFT ANTECUBITAL  Final   Special Requests BOTTLES DRAWN AEROBIC AND ANAEROBIC 8CC  Final   Culture NO GROWTH 5 DAYS  Final   Report Status 03/03/2015 FINAL  Final  Blood Culture (routine x 2)     Status: None   Collection Time: 02/26/15  5:30 PM  Result Value Ref Range Status   Specimen Description BLOOD RIGHT ANTECUBITAL  Final   Special Requests BOTTLES DRAWN AEROBIC AND ANAEROBIC 6CC  Final   Culture NO GROWTH 5 DAYS  Final   Report Status 03/03/2015 FINAL  Final  Urine culture  Status: None   Collection Time: 02/26/15  5:40 PM  Result Value Ref Range Status   Specimen Description URINE, CATHETERIZED  Final   Special Requests NONE  Final   Colony Count NO GROWTH Performed at [facility]   Final   Culture NO GROWTH Performed at [facility]   Final   Report Status 02/27/2015 FINAL  Final  Urine culture     Status: None   Collection Time: 02/26/15 10:58 PM  Result Value Ref Range Status   Specimen Description URINE, CLEAN CATCH  Final   Special Requests NONE  Final   Colony Count NO GROWTH Performed at [facility]   Final   Culture NO GROWTH Performed at [facility]   Final   Report Status 02/28/2015 FINAL  Final     Medical History: Past Medical History  Diagnosis Date  . Hypertension   . Diabetes mellitus   . Hyperlipidemia   . Cataracts, bilateral   . Hyperkalemia   . UTI (lower urinary tract infection)   . Calculus of kidney   . Sensorineural hearing loss, unspecified   . Epiglottitis   . Chronic ankle pain   . Neuromuscular disorder     neurophaty lower legs  . Upper GI bleeding     while at [facility] 12/2014  . Prostate cancer 2009    radiation  . Confusion   . Gastroparesis due to DM: DELAYED GASTRIC EMPTYING STUDY 02/08/15 02/08/2015    Medications:  Scheduled:  . atorvastatin  40 mg Oral Daily  . ceFEPime (MAXIPIME) IV  1 g Intravenous Q24H  . enoxaparin (LOVENOX) injection  30 mg Subcutaneous Q24H  . furosemide  40 mg Intravenous BID  . gabapentin  300 mg Oral QHS  . insulin aspart  0-9 Units Subcutaneous TID WC  . ketorolac  1 drop Both Eyes TID  . metoCLOPramide  5 mg Oral TID AC  . metoprolol tartrate  25 mg Oral BID  . pantoprazole  40 mg Oral Daily  . polyethylene glycol  17 g Oral BID  . tamsulosin  0.4 mg Oral Daily   Assessment: Patient continues on Cefepime for urosepsis- day#7. He is clinically improving. Cx data has been negative (patient was on Cipro PTA however). Renal function stable.   Goal of Therapy:  Eradicate infection  Plan:  Continue Cefepime 1 GM IV every 24 hours Monitor renal function Duration of therapy per MD- consider d/c or de-escalate antibiotics at this point  Biagio Borg 03/04/2015,8:58 AM

## 2015-03-04 NOTE — Progress Notes (Signed)
Foley was irrigated this am. No blood clots noted. Urine output is cloudy and yellow.

## 2015-03-04 NOTE — Progress Notes (Signed)
Speech Language Pathology Treatment: Dysphagia  Patient Details Name: Douglas [name] MRN: [id number] DOB: 17-Sep-1931 Today's Date: 03/04/2015 Time: 8099-8338 SLP Time Calculation (min) (ACUTE ONLY): 22 min  Assessment / Plan / Recommendation Clinical Impression  Douglas [name] is much more alert than when seen on Friday. He was sitting up in bed, talking to his family. Pt agreeable to po trials of regular, thin, and puree- also left with ice cream per his request. He shows no overt signs or symptoms of aspiration at bedside, although intake was somewhat limited. Pt has had a poor appetite, drinking more than eating. Given suspected esophageal dysphagia, eating smaller but frequent meals is recommended to facilitate esophageal and gastric clearance. SLP will f/u prn for diet tolerance. Pt and family report no episodes of emesis or regurgitation.   HPI HPI: Douglas [name] is an 81 yo male who was admitted to [facility] for management of sepsis secondary to UTI. He is known to SLP from previous admission in February. At that time he presented with symptoms of esophageal dysphagia and had a gastric emptying study completed which did show delayed gastric emptying and was placed on reglan. Family states that pt had been doing fairly well over the past 2 weeks at home, but appetite had been poor. SLP asked to evaluate swallow due to regurgitation after po.   Pertinent Vitals Pain Assessment: No/denies pain  SLP Plan  Continue with current plan of care    Recommendations Diet recommendations: Dysphagia 3 (mechanical soft);Thin liquid Liquids provided via: Cup;Straw Medication Administration: Whole meds with liquid Supervision: Patient able to self feed;Intermittent supervision to cue for compensatory strategies Compensations: Slow rate;Small sips/bites;Multiple dry swallows after each bite/sip Postural Changes and/or Swallow Maneuvers: Seated upright 90 degrees;Upright 30-60 min after meal              Oral Care Recommendations: Oral care BID Follow up Recommendations: None Plan: Continue with current plan of care    Thank you,  Genene Churn, [place]      [place] 03/04/2015, 3:44 PM

## 2015-03-04 NOTE — Progress Notes (Signed)
Patients foley was leaking, irrigated and evacuated several clots. Foley working adequately at this time.

## 2015-03-04 NOTE — Progress Notes (Signed)
[name] PROGRESS NOTE  Brick Ketcher Hritz WEX:[id number] DOB: Aug 23, 1931 DOA: 02/26/2015 PCP: [name]., MD  Assessment/Plan: Sepsis 2/2 UTI -Sepsis parameters improved. -He has completed 7 days of cefepime -He has been afebrile and clinically improved. Will discontinue antibiotics  Chronic Urinary Retention -Continue foley/flomax/oxybutinin. -Appreciate GU follow up. -Foley has been exchanged twice this admission due to clogging from blood clots. -May need suprapubic catheters in the future. -Will ask urology to comment on this tomorrow  Acute on CKD Stage IV -With obstructive uropathy. -Cr continues to improve. -Appreciate renal and GU following.  Diabetic Gastroparesis -On reglan. -No n/v.  Acute Metabolic Encephalopathy -Resolved. -Secondary to sepsis picture.  Recent Right Femur Fracture -Resume PT in am. -WBAT per Dr. [name].  Code Status: Partial  Family Communication: Wife and daughter at bedside updated on plan of care  Disposition Plan: home with home health services   Consultants:  Renal  GU   Antibiotics:  Cefepime   Subjective: Patient does not have any complaints today. His wife is at the bedside and reports that he has had frequent hematuria requiring foley catheter to be flushed by nursing staff  Objective: Filed Vitals:   03/03/15 2140 03/03/15 2321 03/04/15 0620 03/04/15 0621  BP: 110/73 118/68 101/63   Pulse: 84 82 83   Temp: 98.5 F (36.9 C) 97.9 F (36.6 C) 98.2 F (36.8 C)   TempSrc: Oral Oral Oral   Resp: 20 20 20    Height:      Weight:    62.5 kg (137 lb 12.6 oz)  SpO2: 94% 95% 94%     Intake/Output Summary (Last 24 hours) at 03/04/15 1829 Last data filed at 03/04/15 1547  Gross per 24 hour  Intake 2898.75 ml  Output   4600 ml  Net -1701.25 ml   Filed Weights   02/28/15 0500 03/03/15 0500 03/04/15 0621  Weight: 64 kg (141 lb 1.5 oz) 63.2 kg (139 lb 5.3 oz) 62.5 kg (137 lb 12.6 oz)     Exam:   General:  AA Ox3  Cardiovascular: RRR  Respiratory: CTA B  Abdomen: S/NT/ND/+BS  Extremities: no C/C/E   Neurologic:  Non-focal  Data Reviewed: Basic Metabolic Panel:  Recent Labs Lab 02/28/15 0505 03/01/15 0643 03/02/15 0614 03/03/15 0617 03/04/15 0616  NA 140 140 139 141 142  K 4.0 4.4 4.1 4.1 4.0  CL 113* 112 109 111 112  CO2 19 20 23 22 23   GLUCOSE 89 132* 124* 157* 145*  BUN 43* 45* 47* 50* 45*  CREATININE 2.94* 3.11* 3.05* 2.81* 2.78*  CALCIUM 7.8* 7.8* 7.9* 7.7* 7.7*  PHOS  --   --   --  3.5  --    Liver Function Tests:  Recent Labs Lab 02/26/15 1725  AST 19  ALT 11  ALKPHOS 104  BILITOT 0.4  PROT 7.4  ALBUMIN 3.4*   No results for input(s): LIPASE, AMYLASE in the last 168 hours. No results for input(s): AMMONIA in the last 168 hours. CBC:  Recent Labs Lab 02/26/15 1725 02/27/15 0537 02/28/15 0505 03/02/15 9381 03/03/15 0617 03/04/15 0616  WBC 9.5 7.4 7.6 7.5 6.9 6.8  NEUTROABS 8.0*  --   --   --   --   --   HGB 9.6* 8.7* 8.0* 8.1* 7.8* 7.8*  HCT 29.5* 27.8* 24.8* 25.2* 24.3* 24.0*  MCV 89.4 91.1 90.2 89.0 89.0 89.2  PLT 146* 123* 124* 128* 131* 144*   Cardiac Enzymes:  Recent Labs Lab 02/26/15  [place] <0.03   BNP (last 3 results)  Recent Labs  02/26/15 1726  BNP 187.0*    ProBNP (last 3 results) No results for input(s): PROBNP in the last 8760 hours.  CBG:  Recent Labs Lab 03/03/15 1133 03/03/15 1628 03/03/15 2135 03/04/15 0800 03/04/15 1145  GLUCAP 173* 164* 196* 126* 139*    Recent Results (from the past 240 hour(s))  Blood Culture (routine x 2)     Status: None   Collection Time: 02/26/15  5:24 PM  Result Value Ref Range Status   Specimen Description BLOOD LEFT ANTECUBITAL  Final   Special Requests BOTTLES DRAWN AEROBIC AND ANAEROBIC 8CC  Final   Culture NO GROWTH 5 DAYS  Final   Report Status 03/03/2015 FINAL  Final  Blood Culture (routine x 2)     Status: None   Collection Time:  02/26/15  5:30 PM  Result Value Ref Range Status   Specimen Description BLOOD RIGHT ANTECUBITAL  Final   Special Requests BOTTLES DRAWN AEROBIC AND ANAEROBIC 6CC  Final   Culture NO GROWTH 5 DAYS  Final   Report Status 03/03/2015 FINAL  Final  Urine culture     Status: None   Collection Time: 02/26/15  5:40 PM  Result Value Ref Range Status   Specimen Description URINE, CATHETERIZED  Final   Special Requests NONE  Final   Colony Count NO GROWTH Performed at [facility]   Final   Culture NO GROWTH Performed at [facility]   Final   Report Status 02/27/2015 FINAL  Final  Urine culture     Status: None   Collection Time: 02/26/15 10:58 PM  Result Value Ref Range Status   Specimen Description URINE, CLEAN CATCH  Final   Special Requests NONE  Final   Colony Count NO GROWTH Performed at [facility]   Final   Culture NO GROWTH Performed at [facility]   Final   Report Status 02/28/2015 FINAL  Final     Studies: No results found.  Scheduled Meds: . atorvastatin  40 mg Oral Daily  . ceFEPime (MAXIPIME) IV  1 g Intravenous Q24H  . enoxaparin (LOVENOX) injection  30 mg Subcutaneous Q24H  . furosemide  40 mg Intravenous BID  . gabapentin  300 mg Oral QHS  . insulin aspart  0-9 Units Subcutaneous TID WC  . ketorolac  1 drop Both Eyes TID  . metoCLOPramide  5 mg Oral TID AC  . metoprolol tartrate  25 mg Oral BID  . pantoprazole  40 mg Oral Daily  . polyethylene glycol  17 g Oral BID  . tamsulosin  0.4 mg Oral Daily   Continuous Infusions: . sodium chloride 75 mL/hr at 03/04/15 1036    Principal Problem:   Sepsis secondary to UTI Active Problems:   Acute renal failure   Tachycardia   Altered mental state   CKD (chronic kidney disease) stage 4, GFR 15-29 ml/min   Anemia, chronic disease   Encephalopathy   Diabetes mellitus, controlled   Urinary tract infectious disease   Urinary retention   Gastroparesis due to DM: DELAYED GASTRIC  EMPTYING STUDY 02/08/15    Time spent: 35 minutes. Greater than 50% of this time was spent in direct contact with the patient coordinating care.    Ruston Fedora  [name] Pager [phone]  If 7PM-7AM, please contact night-coverage at [email or web address], password [facility] 03/04/2015, 6:29 PM  LOS: 6 days

## 2015-03-04 NOTE — Progress Notes (Signed)
Catheter irrigated and several small clots removed. Draining adequately at this time.

## 2015-03-05 DIAGNOSIS — A0472 Enterocolitis due to Clostridium difficile, not specified as recurrent: Secondary | ICD-10-CM | POA: Diagnosis not present

## 2015-03-05 DIAGNOSIS — A047 Enterocolitis due to Clostridium difficile: Secondary | ICD-10-CM

## 2015-03-05 DIAGNOSIS — A419 Sepsis, unspecified organism: Secondary | ICD-10-CM

## 2015-03-05 DIAGNOSIS — N179 Acute kidney failure, unspecified: Secondary | ICD-10-CM

## 2015-03-05 DIAGNOSIS — E119 Type 2 diabetes mellitus without complications: Secondary | ICD-10-CM

## 2015-03-05 DIAGNOSIS — R310 Gross hematuria: Secondary | ICD-10-CM | POA: Diagnosis not present

## 2015-03-05 DIAGNOSIS — R339 Retention of urine, unspecified: Secondary | ICD-10-CM | POA: Diagnosis not present

## 2015-03-05 LAB — BASIC METABOLIC PANEL
ANION GAP: 9 (ref 5–15)
BUN: 39 mg/dL — ABNORMAL HIGH (ref 6–23)
CALCIUM: 7.5 mg/dL — AB (ref 8.4–10.5)
CHLORIDE: 108 mmol/L (ref 96–112)
CO2: 25 mmol/L (ref 19–32)
CREATININE: 2.64 mg/dL — AB (ref 0.50–1.35)
GFR calc Af Amer: 25 mL/min — ABNORMAL LOW (ref >90)
GFR calc non Af Amer: 21 mL/min — ABNORMAL LOW (ref >90)
Glucose, Bld: 125 mg/dL — ABNORMAL HIGH (ref 70–99)
Potassium: 3.6 mmol/L (ref 3.5–5.1)
Sodium: 142 mmol/L (ref 135–145)

## 2015-03-05 LAB — GLUCOSE, CAPILLARY
GLUCOSE-CAPILLARY: 117 mg/dL — AB (ref 70–99)
GLUCOSE-CAPILLARY: 168 mg/dL — AB (ref 70–99)
Glucose-Capillary: 185 mg/dL — ABNORMAL HIGH (ref 70–99)
Glucose-Capillary: 334 mg/dL — ABNORMAL HIGH (ref 70–99)

## 2015-03-05 LAB — PREPARE RBC (CROSSMATCH)

## 2015-03-05 LAB — CBC
HCT: 22.3 % — ABNORMAL LOW (ref 39.0–52.0)
HEMOGLOBIN: 7.1 g/dL — AB (ref 13.0–17.0)
MCH: 28.1 pg (ref 26.0–34.0)
MCHC: 31.8 g/dL (ref 30.0–36.0)
MCV: 88.1 fL (ref 78.0–100.0)
PLATELETS: 160 10*3/uL (ref 150–400)
RBC: 2.53 MIL/uL — AB (ref 4.22–5.81)
RDW: 14 % (ref 11.5–15.5)
WBC: 7.2 10*3/uL (ref 4.0–10.5)

## 2015-03-05 MED ORDER — DARBEPOETIN ALFA 60 MCG/0.3ML IJ SOSY
60.0000 ug | PREFILLED_SYRINGE | INTRAMUSCULAR | Status: DC
  Administered 2015-03-05: 60 ug via SUBCUTANEOUS
  Filled 2015-03-05: qty 0.3

## 2015-03-05 MED ORDER — SACCHAROMYCES BOULARDII 250 MG PO CAPS
250.0000 mg | ORAL_CAPSULE | Freq: Two times a day (BID) | ORAL | Status: DC
  Administered 2015-03-05 – 2015-03-09 (×9): 250 mg via ORAL
  Filled 2015-03-05 (×9): qty 1

## 2015-03-05 MED ORDER — SODIUM CHLORIDE 0.9 % IV SOLN: Freq: Once | INTRAVENOUS | Status: AC

## 2015-03-05 NOTE — Progress Notes (Signed)
[name] PROGRESS NOTE  Douglas [name] WCB:[id number] DOB: Feb 25, 1931 DOA: 02/26/2015 PCP: [name]., MD  Assessment/Plan: Sepsis 2/2 UTI -Sepsis parameters improved. -He has completed 7 days of cefepime -He has been afebrile and clinically improved. Will discontinue antibiotics  Chronic Urinary Retention -Continue foley/flomax/oxybutinin. -Appreciate GU follow up. -Foley has been exchanged twice this admission due to clogging from blood clots. -May need suprapubic catheters in the future. -He has recurrent gross hematuria - await further input from urology  Acute on CKD Stage IV -With obstructive uropathy. -Cr appears to have returned to baseline -Appreciate renal and GU following.  Clostridium difficile enteritis. - stool tested positive for C diff - patient started on flagyl - no significant diarrhea at this time. - discontinue protonix  Acute on chronic anemia -may have a component of acute blood loss from hematuria - transfused 1 unit prbc 3/8 - continue to follow hemoglobin  Diabetic Gastroparesis -On reglan. -No n/v.  Acute Metabolic Encephalopathy -Resolved. -Secondary to sepsis picture.  Recent Right Femur Fracture --WBAT per Dr. [name].  Code Status: Partial  Family Communication: Wife and daughter at bedside updated on plan of care  Disposition Plan: home with home health services   Consultants:  Renal  GU   Antibiotics:  Cefepime 3/1-3/7  Flagyl 3/8  Subjective: Patient does not have any complaints today. He had one loose stool yesterday, but no significant diarrhea since then.  Objective: Filed Vitals:   03/04/15 2115 03/05/15 0547 03/05/15 1602 03/05/15 1649  BP: 131/45 145/61 139/53 134/52  Pulse: 72 70 74 72  Temp: 98 F (36.7 C) 98.1 F (36.7 C) 99 F (37.2 C) 98.2 F (36.8 C)  TempSrc: Oral Oral Oral Oral  Resp: 20 20 20 20   Height:      Weight:  62.1 kg (136 lb 14.5 oz)    SpO2:  98% 96% 98%     Intake/Output Summary (Last 24 hours) at 03/05/15 1808 Last data filed at 03/05/15 1739  Gross per 24 hour  Intake 798.75 ml  Output   2450 ml  Net -1651.25 ml   Filed Weights   03/03/15 0500 03/04/15 0621 03/05/15 0547  Weight: 63.2 kg (139 lb 5.3 oz) 62.5 kg (137 lb 12.6 oz) 62.1 kg (136 lb 14.5 oz)    Exam:   General:  AA Ox3  Cardiovascular: RRR  Respiratory: CTA B  Abdomen: S/NT/ND/+BS  Extremities: no C/C/E   Neurologic:  Non-focal  Data Reviewed: Basic Metabolic Panel:  Recent Labs Lab 03/01/15 0643 03/02/15 0614 03/03/15 0617 03/04/15 0616 03/05/15 0741  NA 140 139 141 142 142  K 4.4 4.1 4.1 4.0 3.6  CL 112 109 111 112 108  CO2 20 23 22 23 25   GLUCOSE 132* 124* 157* 145* 125*  BUN 45* 47* 50* 45* 39*  CREATININE 3.11* 3.05* 2.81* 2.78* 2.64*  CALCIUM 7.8* 7.9* 7.7* 7.7* 7.5*  PHOS  --   --  3.5  --   --    Liver Function Tests: No results for input(s): AST, ALT, ALKPHOS, BILITOT, PROT, ALBUMIN in the last 168 hours. No results for input(s): LIPASE, AMYLASE in the last 168 hours. No results for input(s): AMMONIA in the last 168 hours. CBC:  Recent Labs Lab 02/28/15 0505 03/02/15 0614 03/03/15 0617 03/04/15 0616 03/05/15 0741  WBC 7.6 7.5 6.9 6.8 7.2  HGB 8.0* 8.1* 7.8* 7.8* 7.1*  HCT 24.8* 25.2* 24.3* 24.0* 22.3*  MCV 90.2 89.0 89.0 89.2 88.1  PLT 124*  128* 131* 144* 160   Cardiac Enzymes: No results for input(s): CKTOTAL, CKMB, CKMBINDEX, TROPONINI in the last 168 hours. BNP (last 3 results)  Recent Labs  02/26/15 1726  BNP 187.0*    ProBNP (last 3 results) No results for input(s): PROBNP in the last 8760 hours.  CBG:  Recent Labs Lab 03/04/15 1741 03/04/15 2119 03/05/15 0801 03/05/15 1125 03/05/15 1705  GLUCAP 186* 223* 117* 168* 185*    Recent Results (from the past 240 hour(s))  Blood Culture (routine x 2)     Status: None   Collection Time: 02/26/15  5:24 PM  Result Value Ref Range Status   Specimen  Description BLOOD LEFT ANTECUBITAL  Final   Special Requests BOTTLES DRAWN AEROBIC AND ANAEROBIC 8CC  Final   Culture NO GROWTH 5 DAYS  Final   Report Status 03/03/2015 FINAL  Final  Blood Culture (routine x 2)     Status: None   Collection Time: 02/26/15  5:30 PM  Result Value Ref Range Status   Specimen Description BLOOD RIGHT ANTECUBITAL  Final   Special Requests BOTTLES DRAWN AEROBIC AND ANAEROBIC 6CC  Final   Culture NO GROWTH 5 DAYS  Final   Report Status 03/03/2015 FINAL  Final  Urine culture     Status: None   Collection Time: 02/26/15  5:40 PM  Result Value Ref Range Status   Specimen Description URINE, CATHETERIZED  Final   Special Requests NONE  Final   Colony Count NO GROWTH Performed at [facility]   Final   Culture NO GROWTH Performed at [facility]   Final   Report Status 02/27/2015 FINAL  Final  Urine culture     Status: None   Collection Time: 02/26/15 10:58 PM  Result Value Ref Range Status   Specimen Description URINE, CLEAN CATCH  Final   Special Requests NONE  Final   Colony Count NO GROWTH Performed at [facility]   Final   Culture NO GROWTH Performed at [facility]   Final   Report Status 02/28/2015 FINAL  Final  Clostridium Difficile by PCR     Status: Abnormal   Collection Time: 03/04/15  6:05 PM  Result Value Ref Range Status   C difficile by pcr POSITIVE (A) NEGATIVE Final    Comment: RESULT CALLED TO, READ BACK BY AND VERIFIED WITH: JONES,D AT 2200 ON 03/04/2015 BY ISLEY,B      Studies: No results found.  Scheduled Meds: . atorvastatin  40 mg Oral Daily  . Darbepoetin Alfa  60 mcg Subcutaneous Q7 days  . enoxaparin (LOVENOX) injection  30 mg Subcutaneous Q24H  . furosemide  40 mg Intravenous BID  . gabapentin  300 mg Oral QHS  . insulin aspart  0-9 Units Subcutaneous TID WC  . ketorolac  1 drop Both Eyes TID  . metoCLOPramide  5 mg Oral TID AC  . metoprolol tartrate  25 mg Oral BID  . metroNIDAZOLE   500 mg Oral 3 times per day  . pantoprazole  40 mg Oral Daily  . polyethylene glycol  17 g Oral BID  . saccharomyces boulardii  250 mg Oral BID  . tamsulosin  0.4 mg Oral Daily   Continuous Infusions: . sodium chloride 75 mL/hr at 03/05/15 1308    Principal Problem:   Sepsis secondary to UTI Active Problems:   Acute renal failure   Tachycardia   Altered mental state   CKD (chronic kidney disease) stage 4, GFR 15-29  ml/min   Anemia, chronic disease   Encephalopathy   Diabetes mellitus, controlled   Urinary tract infectious disease   Urinary retention   Gastroparesis due to DM: DELAYED GASTRIC EMPTYING STUDY 02/08/15   Enteritis due to Clostridium difficile    Time spent: 35 minutes. Greater than 50% of this time was spent in direct contact with the patient coordinating care.    Kalven Ganim  [name] Pager [phone]  If 7PM-7AM, please contact night-coverage at [email or web address], password [facility] 03/05/2015, 6:08 PM  LOS: 7 days

## 2015-03-05 NOTE — Progress Notes (Signed)
Physical Therapy Treatment Patient Details Name: Douglas [name] MRN: [id number] DOB: 06-19-1931 Today's Date: 03/05/2015    History of Present Illness 81 yo male with recent hip injury, hospitalization last month with foley since then diagnosed with uti yesterday started on cipro. Progressively worsening today with ams, high fever, shaking all over and vomiting nonbloody. Pt complains of no pain. He lives at home with wife. Previous urine cultures over the last couple of months reviewed, cipro appropriate. Repeat culture data sent in ED. Pt has been nauseas but no vomiting in the ED. Has received 2 liters of ivf, and sepsis protocol. Temp is still over 103. Unknown when last time catheter replaced. Suppose to have urology appt tomorrow for f/u of the indwelling catheter for urinary retention.    PT Comments    Pt was alert and very cooperative, no reported distress.  He was awaiting hookup to receive a unit of blood.  He was able to participate in therapeutic exercise for both LEs and did well with all.  He needed min assist to transfer to EOB, SBA to stand to the walker and ambulate 30'.  After ambulating 15' the left anterior tibialis fatigued and he required assist to advance the LLE.  He was returned to bed to receive blood.  Follow Up Recommendations  Home health PT     Equipment Recommendations  None recommended by PT    Recommendations for Other Services  none     Precautions / Restrictions Precautions Precautions: Fall Required Braces or Orthoses: Knee Immobilizer - Right Knee Immobilizer - Right: On when out of bed or walking Restrictions Weight Bearing Restrictions: Yes RLE Weight Bearing: Weight bearing as tolerated    Mobility  Bed Mobility Overal bed mobility: Modified Independent       Supine to sit: Min assist;HOB elevated        Transfers Overall transfer level: Needs assistance Equipment used: Rolling walker (2 wheeled) Transfers: Sit to/from  Stand Sit to Stand: Supervision            Ambulation/Gait Ambulation/Gait assistance: Min assist Ambulation Distance (Feet): 30 Feet Assistive device: Rolling walker (2 wheeled) Gait Pattern/deviations: Decreased dorsiflexion - left;Step-through pattern Gait velocity: appropriate for situation   General Gait Details: after ambulating about 15' the anterior tibialis on the LLE fatigued and he needed assist to advance his LLE...   Stairs            Wheelchair Mobility    Modified Rankin (Stroke Patients Only)       Balance     Sitting balance-Leahy Scale: Good     Standing balance support: Bilateral upper extremity supported Standing balance-Leahy Scale: Good                      Cognition Arousal/Alertness: Awake/alert Behavior During Therapy: WFL for tasks assessed/performed Overall Cognitive Status: Within Functional Limits for tasks assessed                      Exercises General Exercises - Lower Extremity Ankle Circles/Pumps: AROM;Both;10 reps;Seated Quad Sets: AROM;Both;Seated Gluteal Sets: AROM;Both;10 reps;Supine Short Arc Quad: AAROM;Right;5 reps;Supine Heel Slides: AAROM;Right;5 reps;Seated Hip ABduction/ADduction: AROM;AAROM;Both;10 reps;Supine Straight Leg Raises: AAROM;Both;10 reps;Supine    General Comments        Pertinent Vitals/Pain Pain Assessment: No/denies pain    Home Living                      Prior Function  PT Goals (current goals can now be found in the care plan section) Progress towards PT goals: Progressing toward goals    Frequency  Min 3X/week    PT Plan Current plan remains appropriate    Co-evaluation             End of Session Equipment Utilized During Treatment: Gait belt;Right knee immobilizer Activity Tolerance: Patient tolerated treatment well Patient left: in bed;with call bell/phone within reach;with nursing/sitter in room;with family/visitor present      Time: 7482-7078 PT Time Calculation (min) (ACUTE ONLY): 34 min  Charges:  $Gait Training: 8-22 mins $Therapeutic Exercise: 8-22 mins                    G Codes:      Sable Feil 03-11-15, 4:59 PM

## 2015-03-05 NOTE — Progress Notes (Signed)
Inpatient Diabetes Program Recommendations  AACE/ADA: New Consensus Statement on Inpatient Glycemic Control (2013)  Target Ranges:  Prepandial:   less than 140 mg/dL      Peak postprandial:   less than 180 mg/dL (1-2 hours)      Critically ill patients:  140 - 180 mg/dL   Results for JANZIEL, HOCKETT (MRN [id number]) as of 03/05/2015 07:05  Ref. Range 03/04/2015 08:00 03/04/2015 11:45 03/04/2015 17:41 03/04/2015 21:19  Glucose-Capillary Latest Range: 70-99 mg/dL 126 (H) 139 (H) 186 (H) 223 (H)   Outpatient Diabetes medications: Levemir 10 units QHS, Novolog 0-15 units TID with meals Current orders for Inpatient glycemic control:  Novolog 0-9 units TID with meals  Inpatient Diabetes Program Recommendations Correction (SSI): Please consider ordering Novolog bedtime correction scale.  Thanks, Barnie Alderman, RN, MSN, CCRN, CDE Diabetes Coordinator Inpatient Diabetes Program [phone] (Team Pager) [phone] ([place] office) [phone] [place] office)

## 2015-03-05 NOTE — Progress Notes (Signed)
Subjective: Interval History: has no complaint of difficulty breathing. His appetite is good. Patient does not have any difficulty breathing.  Objective: Vital signs in last 24 hours: Temp:  [98 F (36.7 C)-98.1 F (36.7 C)] 98.1 F (36.7 C) (03/08 0547) Pulse Rate:  [70-72] 70 (03/08 0547) Resp:  [20] 20 (03/08 0547) BP: (131-145)/(45-61) 145/61 mmHg (03/08 0547) SpO2:  [98 %] 98 % (03/08 0547) Weight:  [62.1 kg (136 lb 14.5 oz)] 62.1 kg (136 lb 14.5 oz) (03/08 0547) Weight change: -0.4 kg (-14.1 oz)  Intake/Output from previous day: 03/07 0701 - 03/08 0700 In: 612.5 [I.V.:612.5] Out: 2400 [Urine:2400] Intake/Output this shift: Total I/O In: -  Out: 350 [Urine:350]  General appearance: alert, cooperative and no distress Resp: clear to auscultation bilaterally Cardio: regular rate and rhythm, S1, S2 normal, no murmur, click, rub or gallop GI: soft, non-tender; bowel sounds normal; no masses,  no organomegaly Extremities: extremities normal, atraumatic, no cyanosis or edema  Lab Results:  Recent Labs  03/04/15 0616 03/05/15 0741  WBC 6.8 7.2  HGB 7.8* 7.1*  HCT 24.0* 22.3*  PLT 144* 160   BMET:   Recent Labs  03/04/15 0616 03/05/15 0741  NA 142 142  K 4.0 3.6  CL 112 108  CO2 23 25  GLUCOSE 145* 125*  BUN 45* 39*  CREATININE 2.78* 2.64*  CALCIUM 7.7* 7.5*   No results for input(s): PTH in the last 72 hours. Iron Studies:   Recent Labs  03/04/15 0616  IRON 30*  TIBC 155*  FERRITIN 1246*    Studies/Results: No results found.  I have reviewed the patient's current medications.  Assessment/Plan: Problem #1 acute kidney injury: His renal function seems to be improving progressively. Presently patient denies any nausea no vomiting. His appetite is improving. Problem #2 anemia: His ferritin is very high with reasonable iron saturation. His anemia could be secondary to chronic renal failure.  His hemoglobin presently is continuously dropping. Possibly  from his hematuria.  Problem #3 history of chronic renal failure: Stage IV Problem #4 diabetes Problem #5 history of prostate cancer. Patient presently with Foley catheter.  the major problem seems to be his hematuria.  Problem #6 metabolic bone disease his calcium and his phosphorus is in range. Problem #7 urea tract infection Problem #8 gastroparesis Plan: We'll continue his present management. We'll check his, basic metabolic panel  in the morning. We'll start patient on Aransep 60 g subcutaneous per week     LOS: 7 days   Joleene Burnham S 03/05/2015,9:08 AM

## 2015-03-05 NOTE — Progress Notes (Signed)
Subjective: Patient reports that he feels better, he is still having some bladder spasms.  Objective: Vital signs in last 24 hours: Temp:  [98 F (36.7 C)-99 F (37.2 C)] 98.2 F (36.8 C) (03/08 1649) Pulse Rate:  [70-74] 72 (03/08 1649) Resp:  [20] 20 (03/08 1649) BP: (131-145)/(45-61) 134/52 mmHg (03/08 1649) SpO2:  [96 %-98 %] 98 % (03/08 1649) Weight:  [62.1 kg (136 lb 14.5 oz)] 62.1 kg (136 lb 14.5 oz) (03/08 0547)  Intake/Output from previous day: 03/07 0701 - 03/08 0700 In: 612.5 [I.V.:612.5] Out: 2400 [Urine:2400] Intake/Output this shift: Total I/O In: 798.8 [I.V.:798.8] Out: 1650 [Urine:1650]  Physical Exam:  Constitutional: Vital signs reviewed. WD WN in NAD  . He is alert. Eyes: PERRL, No scleral icterus.   Pulmonary/Chest: Normal effort Abdominal: Soft. Non-tender, non-distended, bowel sounds are normal, no masses, organomegaly, or guarding present.    Foley catheter was irrigated with 500 mL of saline. A few small clots were obtained.  Lab Results:  Recent Labs  03/03/15 0617 03/04/15 0616 03/05/15 0741  HGB 7.8* 7.8* 7.1*  HCT 24.3* 24.0* 22.3*   BMET  Recent Labs  03/04/15 0616 03/05/15 0741  NA 142 142  K 4.0 3.6  CL 112 108  CO2 23 25  GLUCOSE 145* 125*  BUN 45* 39*  CREATININE 2.78* 2.64*  CALCIUM 7.7* 7.5*   No results for input(s): LABPT, INR in the last 72 hours. No results for input(s): LABURIN in the last 72 hours. Results for orders placed or performed during the hospital encounter of 02/26/15  Blood Culture (routine x 2)     Status: None   Collection Time: 02/26/15  5:24 PM  Result Value Ref Range Status   Specimen Description BLOOD LEFT ANTECUBITAL  Final   Special Requests BOTTLES DRAWN AEROBIC AND ANAEROBIC 8CC  Final   Culture NO GROWTH 5 DAYS  Final   Report Status 03/03/2015 FINAL  Final  Blood Culture (routine x 2)     Status: None   Collection Time: 02/26/15  5:30 PM  Result Value Ref Range Status   Specimen  Description BLOOD RIGHT ANTECUBITAL  Final   Special Requests BOTTLES DRAWN AEROBIC AND ANAEROBIC 6CC  Final   Culture NO GROWTH 5 DAYS  Final   Report Status 03/03/2015 FINAL  Final  Urine culture     Status: None   Collection Time: 02/26/15  5:40 PM  Result Value Ref Range Status   Specimen Description URINE, CATHETERIZED  Final   Special Requests NONE  Final   Colony Count NO GROWTH Performed at [facility]   Final   Culture NO GROWTH Performed at [facility]   Final   Report Status 02/27/2015 FINAL  Final  Urine culture     Status: None   Collection Time: 02/26/15 10:58 PM  Result Value Ref Range Status   Specimen Description URINE, CLEAN CATCH  Final   Special Requests NONE  Final   Colony Count NO GROWTH Performed at [facility]   Final   Culture NO GROWTH Performed at [facility]   Final   Report Status 02/28/2015 FINAL  Final  Clostridium Difficile by PCR     Status: Abnormal   Collection Time: 03/04/15  6:05 PM  Result Value Ref Range Status   C difficile by pcr POSITIVE (A) NEGATIVE Final    Comment: RESULT CALLED TO, READ BACK BY AND VERIFIED WITH: JONES,D AT 2200 ON 03/04/2015 BY ISLEY,B  Studies/Results: No results found.  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 Currently, this seems to be improving on the and he does not have a lot of clots. I think it worthwhile just continue to leave the catheter in and provide when necessary irrigation. From the looks of his urine, he does not seem to have significant ongoing blood loss from this. I will have Dr. Jeffie Pollock stop by later this week to check on him.       LOS: 7 days   Franchot Gallo M 03/05/2015, 6:20 PM

## 2015-03-05 NOTE — Progress Notes (Signed)
UR chart review completed.  

## 2015-03-06 LAB — CBC
HEMATOCRIT: 27.9 % — AB (ref 39.0–52.0)
Hemoglobin: 9 g/dL — ABNORMAL LOW (ref 13.0–17.0)
MCH: 28.3 pg (ref 26.0–34.0)
MCHC: 32.3 g/dL (ref 30.0–36.0)
MCV: 87.7 fL (ref 78.0–100.0)
PLATELETS: 191 10*3/uL (ref 150–400)
RBC: 3.18 MIL/uL — AB (ref 4.22–5.81)
RDW: 14 % (ref 11.5–15.5)
WBC: 8.3 10*3/uL (ref 4.0–10.5)

## 2015-03-06 LAB — TYPE AND SCREEN
ABO/RH(D): A POS
ANTIBODY SCREEN: NEGATIVE
Unit division: 0

## 2015-03-06 LAB — BASIC METABOLIC PANEL
ANION GAP: 10 (ref 5–15)
BUN: 36 mg/dL — ABNORMAL HIGH (ref 6–23)
CALCIUM: 7.7 mg/dL — AB (ref 8.4–10.5)
CO2: 27 mmol/L (ref 19–32)
Chloride: 106 mmol/L (ref 96–112)
Creatinine, Ser: 2.62 mg/dL — ABNORMAL HIGH (ref 0.50–1.35)
GFR, EST AFRICAN AMERICAN: 25 mL/min — AB (ref >90)
GFR, EST NON AFRICAN AMERICAN: 21 mL/min — AB (ref >90)
GLUCOSE: 127 mg/dL — AB (ref 70–99)
Potassium: 3.5 mmol/L (ref 3.5–5.1)
SODIUM: 143 mmol/L (ref 135–145)

## 2015-03-06 LAB — GLUCOSE, CAPILLARY
GLUCOSE-CAPILLARY: 152 mg/dL — AB (ref 70–99)
Glucose-Capillary: 123 mg/dL — ABNORMAL HIGH (ref 70–99)
Glucose-Capillary: 177 mg/dL — ABNORMAL HIGH (ref 70–99)
Glucose-Capillary: 155 mg/dL — ABNORMAL HIGH (ref 70–99)

## 2015-03-06 NOTE — Progress Notes (Signed)
Physical Therapy Treatment Patient Details Name: Douglas [name] MRN: [id number] DOB: 1931/02/26 Today's Date: 03/06/2015    History of Present Illness       Subjective  81 yo male with recent hip injury, hospitalization last month with foley since then diagnosed with uti yesterday started on cipro. Progressively worsening today with ams, high fever, shaking all over and vomiting nonbloody. Pt complains of no pain. He lives at home with wife. Previous urine cultures over the last couple of months reviewed, cipro appropriate. Repeat culture data sent in ED. Pt has been nauseas but no vomiting in the ED. Has received 2 liters of ivf, and sepsis protocol. Temp is still over 103. Unknown when last time catheter replaced. Suppose to have urology appt tomorrow for f/u of the indwelling catheter for urinary retention.    Pt stated he was sore following Pt session yesterday.  No reports of pain.    PT Comments    Pt with decreased activity tolerance this session with reports of increased soreness following yesterday's session.  Verbal cueing for proper hand placement with bed mobilty, sit to stand and gait training with RW to assist and safety.  Pt willing to ambulate 6 feet from bed to chair but did not want to walk any further due to weakness.  Pt educated on benefits of completing exercises to assist with soreness.  Pt left in chair with call bell within reach, chair alarm set and family in room.  No reports of increased pain through session, was limited by fatigue.  Follow Up Recommendations        Equipment Recommendations       Recommendations for Other Services       Precautions / Restrictions Precautions Precautions: Fall Required Braces or Orthoses: Knee Immobilizer - Right Knee Immobilizer - Right: On when out of bed or walking Restrictions Weight Bearing Restrictions: Yes RLE Weight Bearing: Weight bearing as tolerated    Mobility  Bed Mobility Overal bed  mobility: Modified Independent Bed Mobility: Supine to Sit     Supine to sit: Min guard     General bed mobility comments: Cueing for handplacement to assist with bed mobilty  Transfers Overall transfer level: Needs assistance Equipment used: Rolling walker (2 wheeled) Transfers: Sit to/from Stand Sit to Stand: Min guard         General transfer comment: Cueing for handplacement to assist with sit to stand and for safety  Ambulation/Gait Ambulation/Gait assistance: Min guard Ambulation Distance (Feet): 6 Feet Assistive device: Rolling walker (2 wheeled) Gait Pattern/deviations: Decreased dorsiflexion - left;Step-through pattern Gait velocity: appropriate for situation       Stairs            Wheelchair Mobility    Modified Rankin (Stroke Patients Only)       Balance                                    Cognition Arousal/Alertness: Awake/alert Behavior During Therapy: WFL for tasks assessed/performed Overall Cognitive Status: Within Functional Limits for tasks assessed                      Exercises General Exercises - Lower Extremity Ankle Circles/Pumps: AROM;Both;10 reps;Supine Quad Sets: AROM;Both;10 reps;Supine Heel Slides: AROM;Both;10 reps;Supine Hip ABduction/ADduction: AROM;Both;10 reps;Supine    General Comments        Pertinent Vitals/Pain Pain Assessment: No/denies pain (Reports soreness following  PT yesterday)    Home Living                      Prior Function            PT Goals (current goals can now be found in the care plan section) Progress towards PT goals: Progressing toward goals    Frequency       PT Plan Current plan remains appropriate    Co-evaluation             End of Session Equipment Utilized During Treatment: Gait belt;Right knee immobilizer Activity Tolerance: Patient tolerated treatment well Patient left: in chair;with call bell/phone within reach;with chair alarm  set;with family/visitor present;with nursing/sitter in room     Time: 1415-1445 PT Time Calculation (min) (ACUTE ONLY): 30 min  Charges:  $Gait Training: 8-22 mins $Therapeutic Exercise: 8-22 mins $Therapeutic Activity: 8-22 mins                    G Codes:      Aldona Lento 03/06/2015, 4:35 PM

## 2015-03-06 NOTE — Progress Notes (Signed)
HILLMAN ATTIG  MRN: [id number]  DOB/AGE: Mar 15, 1931 81 y.o.  Primary Care Physician:[name],[name], MD  Admit date: 02/26/2015  Chief Complaint:  Chief Complaint  Patient presents with  . Emesis  . Altered Mental Status    S-Pt presented on  02/26/2015 with  Chief Complaint  Patient presents with  . Emesis  . Altered Mental Status  .    Pt today feels better.   Meds . atorvastatin  40 mg Oral Daily  . Darbepoetin Alfa  60 mcg Subcutaneous Q7 days  . enoxaparin (LOVENOX) injection  30 mg Subcutaneous Q24H  . furosemide  40 mg Intravenous BID  . gabapentin  300 mg Oral QHS  . insulin aspart  0-9 Units Subcutaneous TID WC  . ketorolac  1 drop Both Eyes TID  . metoCLOPramide  5 mg Oral TID AC  . metoprolol tartrate  25 mg Oral BID  . metroNIDAZOLE  500 mg Oral 3 times per day  . polyethylene glycol  17 g Oral BID  . saccharomyces boulardii  250 mg Oral BID  . tamsulosin  0.4 mg Oral Daily       Physical Exam: Vital signs in last 24 hours: Temp:  [98.2 F (36.8 C)-99 F (37.2 C)] 98.8 F (37.1 C) (03/09 0631) Pulse Rate:  [71-74] 72 (03/09 0631) Resp:  [18-20] 20 (03/09 0631) BP: (134-152)/(49-57) 152/57 mmHg (03/09 0631) SpO2:  [94 %-98 %] 94 % (03/09 0631) Weight:  [133 lb 12.8 oz (60.691 kg)] 133 lb 12.8 oz (60.691 kg) (03/09 0631) Weight change: -3 lb 1.7 oz (-1.409 kg) Last BM Date: 03/05/15  Intake/Output from previous day: 03/08 0701 - 03/09 0700 In: 2755 [P.O.:680; I.V.:1725; Blood:350] Out: 3300 [Urine:3300]     Physical Exam: General- pt is awake,alert, oriented to time place and person Resp- No acute REsp distress, CTA B/L NO Rhonchi CVS- S1S2 regular in rate and rhythm GIT- BS+, soft, NT, ND EXT- NO LE Edema, NO Cyanosis          Left lower leg in bandages  Lab Results: CBC  Recent Labs  03/05/15 0741 03/06/15 0649  WBC 7.2 8.3  HGB 7.1* 9.0*  HCT 22.3* 27.9*  PLT 160 191    BMET  Recent Labs  03/05/15 0741 03/06/15 0649   NA 142 143  K 3.6 3.5  CL 108 106  CO2 25 27  GLUCOSE 125* 127*  BUN 39* 36*  CREATININE 2.64* 2.62*  CALCIUM 7.5* 7.7*   Creat trend 2016 3.11=>2.64=>2.62         Admission n  Feb 2.5--3.3           Admission in Jan 3.2--3.6 2015  2.5--3.2 2011  1.5--2.0    MICRO Recent Results (from the past 240 hour(s))  Blood Culture (routine x 2)     Status: None   Collection Time: 02/26/15  5:24 PM  Result Value Ref Range Status   Specimen Description BLOOD LEFT ANTECUBITAL  Final   Special Requests BOTTLES DRAWN AEROBIC AND ANAEROBIC 8CC  Final   Culture NO GROWTH 5 DAYS  Final   Report Status 03/03/2015 FINAL  Final  Blood Culture (routine x 2)     Status: None   Collection Time: 02/26/15  5:30 PM  Result Value Ref Range Status   Specimen Description BLOOD RIGHT ANTECUBITAL  Final   Special Requests BOTTLES DRAWN AEROBIC AND ANAEROBIC [place]  Final   Culture NO GROWTH 5 DAYS  Final   Report Status 03/03/2015  FINAL  Final  Urine culture     Status: None   Collection Time: 02/26/15  5:40 PM  Result Value Ref Range Status   Specimen Description URINE, CATHETERIZED  Final   Special Requests NONE  Final   Colony Count NO GROWTH Performed at [facility]   Final   Culture NO GROWTH Performed at [facility]   Final   Report Status 02/27/2015 FINAL  Final  Urine culture     Status: None   Collection Time: 02/26/15 10:58 PM  Result Value Ref Range Status   Specimen Description URINE, CLEAN CATCH  Final   Special Requests NONE  Final   Colony Count NO GROWTH Performed at [facility]   Final   Culture NO GROWTH Performed at [facility]   Final   Report Status 02/28/2015 FINAL  Final  Clostridium Difficile by PCR     Status: Abnormal   Collection Time: 03/04/15  6:05 PM  Result Value Ref Range Status   C difficile by pcr POSITIVE (A) NEGATIVE Final    Comment: RESULT CALLED TO, READ BACK BY AND VERIFIED WITH: JONES,D AT 2200 ON 03/04/2015 BY  ISLEY,B       Lab Results  Component Value Date   PTH 131.8* 04/21/2014   CALCIUM 7.7* 03/06/2015   CAION 1.05* 11/15/2014   PHOS 3.5 03/03/2015         Impression: 1)Renal  AKI secondary to Prerenal/ATN/Post renal               AKI now better               Creat near at baseline               AKI on CKD               CKD stage 4.               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 On ESA  4)CKD Mineral-Bone Disorder PTH acceptable Secondary Hyperparathyroidism  present Phosphorus at goal. Calcium- will recheck albumin  5)Urology- Prostate cancer/hematuria Primary MD and Urology following  6)Electrolytes Normokalemic NOrmonatremic   7)Acid base Co2 at goal     Plan:  Will check albumin to help correct for albumin Will continue current care.      [place] S 03/06/2015, 8:54 AM

## 2015-03-06 NOTE — Progress Notes (Signed)
Inpatient Diabetes Program Recommendations  AACE/ADA: New Consensus Statement on Inpatient Glycemic Control (2013)  Target Ranges:  Prepandial:   less than 140 mg/dL      Peak postprandial:   less than 180 mg/dL (1-2 hours)      Critically ill patients:  140 - 180 mg/dL   Results for ELDEAN, NANNA (MRN [id number]) as of 03/06/2015 07:37  Ref. Range 03/05/2015 08:01 03/05/2015 11:25 03/05/2015 17:05 03/05/2015 22:12  Glucose-Capillary Latest Range: 70-99 mg/dL 117 (H) 168 (H) 185 (H) 334 (H)   Outpatient Diabetes medications: Levemir 10 units QHS, Novolog 0-15 units TID with meals Current orders for Inpatient glycemic control: Novolog 0-9 units TID with meals  Inpatient Diabetes Program Recommendations Insulin - Basal: Please consider ordering Levemir 6 units daily (to start now). Correction (SSI): Please consider ordering Novolog bedtime correction scale.  Thanks, Barnie Alderman, RN, MSN, CCRN, CDE Diabetes Coordinator Inpatient Diabetes Program [phone] (Team Pager) [phone] ([place] office) [phone] [facility] office)

## 2015-03-06 NOTE — Progress Notes (Signed)
[name] PROGRESS NOTE  Douglas [name] GEX:[id number] DOB: 1931/08/15 DOA: 02/26/2015 PCP: [name]., MD  Assessment/Plan: Sepsis 2/2 UTI -Sepsis parameters improved. -He has completed 7 days of cefepime -He has been afebrile and clinically improved. Will discontinue antibiotics  Chronic Urinary Retention -Continue foley/flomax/oxybutinin. -Appreciate GU follow up. -Foley has been exchanged twice this admission due to clogging from blood clots. -May need suprapubic catheters in the future. -He has recurrent gross hematuria - await further input from urology  Acute on CKD Stage IV -With obstructive uropathy. -Cr appears to have returned to baseline -Appreciate renal and GU following.  Clostridium difficile enteritis. - stool tested positive for C diff - patient  on flagyl - no significant diarrhea at this time.   Acute on chronic anemia -may have a component of acute blood loss from hematuria - transfused 1 unit prbc 3/8 - post transfusion hemoglobin has improved - continue to follow, especially in the setting of ongoing hematuria  Diabetic Gastroparesis -On reglan. -No n/v.  Acute Metabolic Encephalopathy -Resolved. -Secondary to sepsis picture.  Recent Right Femur Fracture --WBAT per Dr. [name].  Code Status: Partial  Family Communication: Wife and daughter at bedside updated on plan of care  Disposition Plan: home with home health services   Consultants:  Renal  GU   Antibiotics:  Cefepime 3/1-3/7  Flagyl 3/8  Subjective: Patient does not have any complaints today. No shortness of breath.  Objective: Filed Vitals:   03/05/15 1649 03/05/15 1902 03/05/15 2203 03/06/15 0631  BP: 134/52 147/57 146/49 152/57  Pulse: 72 73 71 72  Temp: 98.2 F (36.8 C) 98.4 F (36.9 C) 98.9 F (37.2 C) 98.8 F (37.1 C)  TempSrc: Oral Oral Oral Oral  Resp: 20 20 18 20   Height:      Weight:    60.691 kg (133 lb 12.8 oz)  SpO2: 98% 96% 96%  94%    Intake/Output Summary (Last 24 hours) at 03/06/15 1849 Last data filed at 03/06/15 1840  Gross per 24 hour  Intake 2786.25 ml  Output   3400 ml  Net -613.75 ml   Filed Weights   03/04/15 0621 03/05/15 0547 03/06/15 0631  Weight: 62.5 kg (137 lb 12.6 oz) 62.1 kg (136 lb 14.5 oz) 60.691 kg (133 lb 12.8 oz)    Exam:   General:  AA Ox3  Cardiovascular: RRR  Respiratory: CTA B  Abdomen: S/NT/ND/+BS  Extremities: no C/C/E   Neurologic:  Non-focal  Data Reviewed: Basic Metabolic Panel:  Recent Labs Lab 03/02/15 0614 03/03/15 0617 03/04/15 0616 03/05/15 0741 03/06/15 0649  NA 139 141 142 142 143  K 4.1 4.1 4.0 3.6 3.5  CL 109 111 112 108 106  CO2 23 22 23 25 27   GLUCOSE 124* 157* 145* 125* 127*  BUN 47* 50* 45* 39* 36*  CREATININE 3.05* 2.81* 2.78* 2.64* 2.62*  CALCIUM 7.9* 7.7* 7.7* 7.5* 7.7*  PHOS  --  3.5  --   --   --    Liver Function Tests: No results for input(s): AST, ALT, ALKPHOS, BILITOT, PROT, ALBUMIN in the last 168 hours. No results for input(s): LIPASE, AMYLASE in the last 168 hours. No results for input(s): AMMONIA in the last 168 hours. CBC:  Recent Labs Lab 03/02/15 0614 03/03/15 0617 03/04/15 0616 03/05/15 0741 03/06/15 0649  WBC 7.5 6.9 6.8 7.2 8.3  HGB 8.1* 7.8* 7.8* 7.1* 9.0*  HCT 25.2* 24.3* 24.0* 22.3* 27.9*  MCV 89.0 89.0 89.2 88.1 87.7  PLT 128* 131* 144* 160 191   Cardiac Enzymes: No results for input(s): CKTOTAL, CKMB, CKMBINDEX, TROPONINI in the last 168 hours. BNP (last 3 results)  Recent Labs  02/26/15 1726  BNP 187.0*    ProBNP (last 3 results) No results for input(s): PROBNP in the last 8760 hours.  CBG:  Recent Labs Lab 03/05/15 1705 03/05/15 2212 03/06/15 0755 03/06/15 1137 03/06/15 1700  GLUCAP 185* 334* 123* 177* 155*    Recent Results (from the past 240 hour(s))  Blood Culture (routine x 2)     Status: None   Collection Time: 02/26/15  5:24 PM  Result Value Ref Range Status   Specimen  Description BLOOD LEFT ANTECUBITAL  Final   Special Requests BOTTLES DRAWN AEROBIC AND ANAEROBIC 8CC  Final   Culture NO GROWTH 5 DAYS  Final   Report Status 03/03/2015 FINAL  Final  Blood Culture (routine x 2)     Status: None   Collection Time: 02/26/15  5:30 PM  Result Value Ref Range Status   Specimen Description BLOOD RIGHT ANTECUBITAL  Final   Special Requests BOTTLES DRAWN AEROBIC AND ANAEROBIC 6CC  Final   Culture NO GROWTH 5 DAYS  Final   Report Status 03/03/2015 FINAL  Final  Urine culture     Status: None   Collection Time: 02/26/15  5:40 PM  Result Value Ref Range Status   Specimen Description URINE, CATHETERIZED  Final   Special Requests NONE  Final   Colony Count NO GROWTH Performed at [facility]   Final   Culture NO GROWTH Performed at [facility]   Final   Report Status 02/27/2015 FINAL  Final  Urine culture     Status: None   Collection Time: 02/26/15 10:58 PM  Result Value Ref Range Status   Specimen Description URINE, CLEAN CATCH  Final   Special Requests NONE  Final   Colony Count NO GROWTH Performed at [facility]   Final   Culture NO GROWTH Performed at [facility]   Final   Report Status 02/28/2015 FINAL  Final  Clostridium Difficile by PCR     Status: Abnormal   Collection Time: 03/04/15  6:05 PM  Result Value Ref Range Status   C difficile by pcr POSITIVE (A) NEGATIVE Final    Comment: RESULT CALLED TO, READ BACK BY AND VERIFIED WITH: JONES,D AT 2200 ON 03/04/2015 BY ISLEY,B      Studies: No results found.  Scheduled Meds: . atorvastatin  40 mg Oral Daily  . Darbepoetin Alfa  60 mcg Subcutaneous Q7 days  . enoxaparin (LOVENOX) injection  30 mg Subcutaneous Q24H  . furosemide  40 mg Intravenous BID  . gabapentin  300 mg Oral QHS  . insulin aspart  0-9 Units Subcutaneous TID WC  . ketorolac  1 drop Both Eyes TID  . metoCLOPramide  5 mg Oral TID AC  . metoprolol tartrate  25 mg Oral BID  . metroNIDAZOLE   500 mg Oral 3 times per day  . polyethylene glycol  17 g Oral BID  . saccharomyces boulardii  250 mg Oral BID  . tamsulosin  0.4 mg Oral Daily   Continuous Infusions: . sodium chloride 75 mL/hr at 03/06/15 0600    Principal Problem:   Sepsis secondary to UTI Active Problems:   Acute renal failure   Tachycardia   Altered mental state   CKD (chronic kidney disease) stage 4, GFR 15-29 ml/min   Anemia, chronic disease  Encephalopathy   Diabetes mellitus, controlled   Urinary tract infectious disease   Urinary retention   Gastroparesis due to DM: DELAYED GASTRIC EMPTYING STUDY 02/08/15   Enteritis due to Clostridium difficile    Time spent: 35 minutes. Greater than 50% of this time was spent in direct contact with the patient coordinating care.    Reshawn Ostlund  [name] Pager [phone]  If 7PM-7AM, please contact night-coverage at [email or web address], password [facility] 03/06/2015, 6:49 PM  LOS: 8 days

## 2015-03-06 NOTE — Consult Note (Signed)
WOC wound consult note Reason for Consult: left heel ulcer Wound type: Unstageable Pressure Ulcer Pressure Ulcer POA: Yes Measurement:3.0cm x 2.5cm x 0 Wound bed: 100% eschar with some loosening at the wound edges  Drainage (amount, consistency, odor) minimal at wound edges  Periwound:intact with pink, blanchable skin  Dressing procedure/placement/frequency: Stable heel eschar best practice to keep hard and dry, will ask staff to paint daily with betadine to dry up the scant amount of drainage at the edge of the eschar. Explained this family at the bedside as well.  Most importantly heel must be floated at all time to relieve the pressure.  Pts family reports he has Prevalon boot at home, they do not wish for me to order another one at this time instead will bring that one in for use.    Discussed POC with patient and bedside nurse.  Re consult if needed, will not follow at this time. Thanks  Bjorn Hallas [place], [place] [phone])

## 2015-03-07 ENCOUNTER — Inpatient Hospital Stay (HOSPITAL_COMMUNITY): Payer: Medicare Other

## 2015-03-07 LAB — GLUCOSE, CAPILLARY
GLUCOSE-CAPILLARY: 119 mg/dL — AB (ref 70–99)
Glucose-Capillary: 115 mg/dL — ABNORMAL HIGH (ref 70–99)
Glucose-Capillary: 149 mg/dL — ABNORMAL HIGH (ref 70–99)
Glucose-Capillary: 178 mg/dL — ABNORMAL HIGH (ref 70–99)

## 2015-03-07 LAB — BASIC METABOLIC PANEL
Anion gap: 9 (ref 5–15)
BUN: 34 mg/dL — ABNORMAL HIGH (ref 6–23)
CHLORIDE: 104 mmol/L (ref 96–112)
CO2: 27 mmol/L (ref 19–32)
Calcium: 7.6 mg/dL — ABNORMAL LOW (ref 8.4–10.5)
Creatinine, Ser: 2.68 mg/dL — ABNORMAL HIGH (ref 0.50–1.35)
GFR calc Af Amer: 24 mL/min — ABNORMAL LOW (ref >90)
GFR calc non Af Amer: 21 mL/min — ABNORMAL LOW (ref >90)
GLUCOSE: 120 mg/dL — AB (ref 70–99)
POTASSIUM: 3.6 mmol/L (ref 3.5–5.1)
Sodium: 140 mmol/L (ref 135–145)

## 2015-03-07 LAB — CBC
HEMATOCRIT: 27.9 % — AB (ref 39.0–52.0)
Hemoglobin: 8.8 g/dL — ABNORMAL LOW (ref 13.0–17.0)
MCH: 27.8 pg (ref 26.0–34.0)
MCHC: 31.5 g/dL (ref 30.0–36.0)
MCV: 88.3 fL (ref 78.0–100.0)
PLATELETS: 188 10*3/uL (ref 150–400)
RBC: 3.16 MIL/uL — ABNORMAL LOW (ref 4.22–5.81)
RDW: 14 % (ref 11.5–15.5)
WBC: 8.2 10*3/uL (ref 4.0–10.5)

## 2015-03-07 LAB — ALBUMIN: Albumin: 2.7 g/dL — ABNORMAL LOW (ref 3.5–5.2)

## 2015-03-07 LAB — URIC ACID: Uric Acid, Serum: 7.2 mg/dL (ref 4.0–7.8)

## 2015-03-07 MED ORDER — TORSEMIDE 20 MG PO TABS
40.0000 mg | ORAL_TABLET | Freq: Every day | ORAL | Status: DC
  Administered 2015-03-07 – 2015-03-08 (×2): 40 mg via ORAL
  Filled 2015-03-07 (×2): qty 2

## 2015-03-07 NOTE — Progress Notes (Signed)
[name] PROGRESS NOTE  Douglas [name] Signor BVQ:[id number] DOB: 06/18/1931 DOA: 02/26/2015 PCP: [name]., MD  Assessment/Plan: Sepsis 2/2 UTI -Sepsis parameters improved. -He has completed 7 days of cefepime -He has been afebrile and clinically improved. Will discontinue antibiotics  Chronic Urinary Retention -Continue foley/flomax/oxybutinin. -Appreciate GU follow up. -Foley has been exchanged twice this admission due to clogging from blood clots. -May need suprapubic catheters in the future. -He has recurrent gross hematuria - await further input from urology  Acute on CKD Stage IV -With obstructive uropathy. -Cr appears to have returned to baseline -Appreciate renal and GU following.  Clostridium difficile enteritis. - stool tested positive for C diff - patient  on flagyl - no significant diarrhea at this time.   Acute on chronic anemia -may have a component of acute blood loss from hematuria - transfused 1 unit prbc 3/8 - post transfusion hemoglobin has improved - continue to follow, especially in the setting of ongoing hematuria  Diabetic Gastroparesis -On reglan. -No n/v.  Acute Metabolic Encephalopathy -Resolved. -Secondary to sepsis picture.  Recent Right Femur Fracture --WBAT per Dr. [name].  Right knee pain. -We'll check x-ray of right knee, venous Dopplers rule out DVT and uric acid level.  Code Status: Partial  Family Communication: Wife and daughter at bedside updated on plan of care  Disposition Plan: home with home health services   Consultants:  Renal  GU   Antibiotics:  Cefepime 3/1-3/7  Flagyl 3/8  Subjective: Complaining of pain in the right knee today which appears to have begun sometime yesterday. Feels that it is getting worse today.  Objective: Filed Vitals:   03/06/15 0631 03/06/15 2203 03/07/15 0617 03/07/15 1240  BP: 152/57 170/58 152/57 139/55  Pulse: 72 73 69 75  Temp: 98.8 F (37.1 C) 98.9 F  (37.2 C) 98 F (36.7 C) 98.7 F (37.1 C)  TempSrc: Oral Oral Oral Oral  Resp: 20 16 18 18   Height:      Weight: 60.691 kg (133 lb 12.8 oz)  60.2 kg (132 lb 11.5 oz)   SpO2: 94% 97% 95% 96%    Intake/Output Summary (Last 24 hours) at 03/07/15 1851 Last data filed at 03/07/15 1200  Gross per 24 hour  Intake    500 ml  Output   1350 ml  Net   -850 ml   Filed Weights   03/05/15 0547 03/06/15 0631 03/07/15 0617  Weight: 62.1 kg (136 lb 14.5 oz) 60.691 kg (133 lb 12.8 oz) 60.2 kg (132 lb 11.5 oz)    Exam:   General:  AA Ox3  Cardiovascular: RRR  Respiratory: CTA B  Abdomen: S/NT/ND/+BS  Extremities: Right knee is mildly edematous and slightly warm without any erythema.  Neurologic:  Non-focal  Data Reviewed: Basic Metabolic Panel:  Recent Labs Lab 03/03/15 0617 03/04/15 0616 03/05/15 0741 03/06/15 0649 03/07/15 0720  NA 141 142 142 143 140  K 4.1 4.0 3.6 3.5 3.6  CL 111 112 108 106 104  CO2 22 23 25 27 27   GLUCOSE 157* 145* 125* 127* 120*  BUN 50* 45* 39* 36* 34*  CREATININE 2.81* 2.78* 2.64* 2.62* 2.68*  CALCIUM 7.7* 7.7* 7.5* 7.7* 7.6*  PHOS 3.5  --   --   --   --    Liver Function Tests:  Recent Labs Lab 03/07/15 0720  ALBUMIN 2.7*   No results for input(s): LIPASE, AMYLASE in the last 168 hours. No results for input(s): AMMONIA in the last 168 hours.  CBC:  Recent Labs Lab 03/03/15 0617 03/04/15 0616 03/05/15 0741 03/06/15 0649 03/07/15 0720  WBC 6.9 6.8 7.2 8.3 8.2  HGB 7.8* 7.8* 7.1* 9.0* 8.8*  HCT 24.3* 24.0* 22.3* 27.9* 27.9*  MCV 89.0 89.2 88.1 87.7 88.3  PLT 131* 144* 160 191 188   Cardiac Enzymes: No results for input(s): CKTOTAL, CKMB, CKMBINDEX, TROPONINI in the last 168 hours. BNP (last 3 results)  Recent Labs  02/26/15 1726  BNP 187.0*    ProBNP (last 3 results) No results for input(s): PROBNP in the last 8760 hours.  CBG:  Recent Labs Lab 03/06/15 1700 03/06/15 2208 03/07/15 0745 03/07/15 1140  03/07/15 1724  GLUCAP 155* 152* 115* 149* 178*    Recent Results (from the past 240 hour(s))  Blood Culture (routine x 2)     Status: None   Collection Time: 02/26/15  5:24 PM  Result Value Ref Range Status   Specimen Description BLOOD LEFT ANTECUBITAL  Final   Special Requests BOTTLES DRAWN AEROBIC AND ANAEROBIC 8CC  Final   Culture NO GROWTH 5 DAYS  Final   Report Status 03/03/2015 FINAL  Final  Blood Culture (routine x 2)     Status: None   Collection Time: 02/26/15  5:30 PM  Result Value Ref Range Status   Specimen Description BLOOD RIGHT ANTECUBITAL  Final   Special Requests BOTTLES DRAWN AEROBIC AND ANAEROBIC 6CC  Final   Culture NO GROWTH 5 DAYS  Final   Report Status 03/03/2015 FINAL  Final  Urine culture     Status: None   Collection Time: 02/26/15  5:40 PM  Result Value Ref Range Status   Specimen Description URINE, CATHETERIZED  Final   Special Requests NONE  Final   Colony Count NO GROWTH Performed at [facility]   Final   Culture NO GROWTH Performed at [facility]   Final   Report Status 02/27/2015 FINAL  Final  Urine culture     Status: None   Collection Time: 02/26/15 10:58 PM  Result Value Ref Range Status   Specimen Description URINE, CLEAN CATCH  Final   Special Requests NONE  Final   Colony Count NO GROWTH Performed at [facility]   Final   Culture NO GROWTH Performed at [facility]   Final   Report Status 02/28/2015 FINAL  Final  Clostridium Difficile by PCR     Status: Abnormal   Collection Time: 03/04/15  6:05 PM  Result Value Ref Range Status   C difficile by pcr POSITIVE (A) NEGATIVE Final    Comment: RESULT CALLED TO, READ BACK BY AND VERIFIED WITH: JONES,D AT 2200 ON 03/04/2015 BY ISLEY,B      Studies: No results found.  Scheduled Meds: . atorvastatin  40 mg Oral Daily  . Darbepoetin Alfa  60 mcg Subcutaneous Q7 days  . gabapentin  300 mg Oral QHS  . insulin aspart  0-9 Units Subcutaneous TID WC   . ketorolac  1 drop Both Eyes TID  . metoCLOPramide  5 mg Oral TID AC  . metoprolol tartrate  25 mg Oral BID  . metroNIDAZOLE  500 mg Oral 3 times per day  . polyethylene glycol  17 g Oral BID  . saccharomyces boulardii  250 mg Oral BID  . tamsulosin  0.4 mg Oral Daily  . torsemide  40 mg Oral Daily   Continuous Infusions:    Principal Problem:   Sepsis secondary to UTI Active Problems:   Acute  renal failure   Tachycardia   Altered mental state   CKD (chronic kidney disease) stage 4, GFR 15-29 ml/min   Anemia, chronic disease   Encephalopathy   Diabetes mellitus, controlled   Urinary tract infectious disease   Urinary retention   Gastroparesis due to DM: DELAYED GASTRIC EMPTYING STUDY 02/08/15   Enteritis due to Clostridium difficile    Time spent: 35 minutes. Greater than 50% of this time was spent in direct contact with the patient coordinating care.    MEMON,JEHANZEB  [name] Pager [phone]  If 7PM-7AM, please contact night-coverage at [email or web address], password [facility] 03/07/2015, 6:51 PM  LOS: 9 days

## 2015-03-07 NOTE — Progress Notes (Signed)
PT Cancellation Note  Patient Details Name: Douglas [name] MRN: [id number] DOB: 1931-06-07   Cancelled Treatment:    Reason Eval/Treat Not Completed: Fatigue/lethargy limiting ability to participate   Sable Feil 03/07/2015, 2:44 PM

## 2015-03-07 NOTE — Progress Notes (Signed)
Subjective: Interval History: Patient presently asymptomatic. His appetite is good and he denies any nausea vomiting.  Objective: Vital signs in last 24 hours: Temp:  [98 F (36.7 C)-98.9 F (37.2 C)] 98 F (36.7 C) (03/10 0617) Pulse Rate:  [69-73] 69 (03/10 0617) Resp:  [16-18] 18 (03/10 0617) BP: (152-170)/(57-58) 152/57 mmHg (03/10 0617) SpO2:  [95 %-97 %] 95 % (03/10 0617) Weight:  [60.2 kg (132 lb 11.5 oz)] 60.2 kg (132 lb 11.5 oz) (03/10 0617) Weight change: -0.491 kg (-1 lb 1.3 oz)  Intake/Output from previous day: 03/09 0701 - 03/10 0700 In: 1530 [P.O.:560; I.V.:950] Out: 3100 [Urine:3100] Intake/Output this shift: Total I/O In: 240 [P.O.:240] Out: -   General appearance: alert, cooperative and no distress Resp: clear to auscultation bilaterally Cardio: regular rate and rhythm, S1, S2 normal, no murmur, click, rub or gallop GI: soft, non-tender; bowel sounds normal; no masses,  no organomegaly Extremities: extremities normal, atraumatic, no cyanosis or edema  Lab Results:  Recent Labs  03/06/15 0649 03/07/15 0720  WBC 8.3 8.2  HGB 9.0* 8.8*  HCT 27.9* 27.9*  PLT 191 188   BMET:   Recent Labs  03/06/15 0649 03/07/15 0720  NA 143 140  K 3.5 3.6  CL 106 104  CO2 27 27  GLUCOSE 127* 120*  BUN 36* 34*  CREATININE 2.62* 2.68*  CALCIUM 7.7* 7.6*   No results for input(s): PTH in the last 72 hours. Iron Studies:  No results for input(s): IRON, TIBC, TRANSFERRIN, FERRITIN in the last 72 hours.  Studies/Results: No results found.  I have reviewed the patient's current medications.  Assessment/Plan: Problem #1 acute kidney injury: His renal function seems to be stable. Presently he is asymptomatic. Problem #2 anemia: His ferritin is very high with reasonable iron saturation. His anemia could be secondary to chronic renal failure.  His hemoglobin presently is low but stable Problem #3 history of chronic renal failure: Stage IV Problem #4  diabetes Problem #5 history of prostate cancer. Patient presently with Foley catheter. Patient continued to have recurrent immature area but seems to be somewhat better today. Patient is being followed by urology. Problem #6 metabolic bone disease his calcium and his phosphorus is in range. Problem #7 urinary tract infection Problem #8 gastroparesis Plan: We'll DC IV Lasix and IV fluid We'll start patient on Demadex 40 mg by mouth once a day We'll check his, basic metabolic panel  in the morning. We'll see patient in 3 weeks if he is going to be discharged.    LOS: 9 days   Unnamed Zeien S 03/07/2015,10:31 AM

## 2015-03-08 ENCOUNTER — Ambulatory Visit: Payer: Medicare Other | Admitting: Urology

## 2015-03-08 ENCOUNTER — Inpatient Hospital Stay (HOSPITAL_COMMUNITY): Payer: Medicare Other

## 2015-03-08 LAB — CBC
HCT: 30 % — ABNORMAL LOW (ref 39.0–52.0)
Hemoglobin: 9.5 g/dL — ABNORMAL LOW (ref 13.0–17.0)
MCH: 28.1 pg (ref 26.0–34.0)
MCHC: 31.7 g/dL (ref 30.0–36.0)
MCV: 88.8 fL (ref 78.0–100.0)
Platelets: 199 10*3/uL (ref 150–400)
RBC: 3.38 MIL/uL — ABNORMAL LOW (ref 4.22–5.81)
RDW: 14 % (ref 11.5–15.5)
WBC: 8.5 10*3/uL (ref 4.0–10.5)

## 2015-03-08 LAB — BASIC METABOLIC PANEL
ANION GAP: 10 (ref 5–15)
BUN: 34 mg/dL — AB (ref 6–23)
CALCIUM: 7.6 mg/dL — AB (ref 8.4–10.5)
CHLORIDE: 101 mmol/L (ref 96–112)
CO2: 28 mmol/L (ref 19–32)
CREATININE: 2.87 mg/dL — AB (ref 0.50–1.35)
GFR calc Af Amer: 22 mL/min — ABNORMAL LOW (ref >90)
GFR calc non Af Amer: 19 mL/min — ABNORMAL LOW (ref >90)
GLUCOSE: 110 mg/dL — AB (ref 70–99)
Potassium: 3.8 mmol/L (ref 3.5–5.1)
Sodium: 139 mmol/L (ref 135–145)

## 2015-03-08 LAB — GLUCOSE, CAPILLARY
GLUCOSE-CAPILLARY: 107 mg/dL — AB (ref 70–99)
Glucose-Capillary: 112 mg/dL — ABNORMAL HIGH (ref 70–99)
Glucose-Capillary: 157 mg/dL — ABNORMAL HIGH (ref 70–99)
Glucose-Capillary: 144 mg/dL — ABNORMAL HIGH (ref 70–99)

## 2015-03-08 LAB — MRSA PCR SCREENING: MRSA BY PCR: NEGATIVE

## 2015-03-08 MED ORDER — SODIUM CHLORIDE 0.9 % IV SOLN
INTRAVENOUS | Status: AC
  Administered 2015-03-08: 20:00:00 via INTRAVENOUS

## 2015-03-08 MED ORDER — TORSEMIDE 20 MG PO TABS
20.0000 mg | ORAL_TABLET | Freq: Every day | ORAL | Status: DC
  Administered 2015-03-09: 20 mg via ORAL
  Filled 2015-03-08: qty 1

## 2015-03-08 NOTE — Progress Notes (Signed)
Physical Therapy Treatment Patient Details Name: Douglas [name] MRN: [id number] DOB: 1931/03/26 Today's Date: 03/08/2015    History of Present Illness 81 yo male with recent hip injury, hospitalization last month with foley since then diagnosed with uti yesterday started on cipro. Progressively worsening today with ams, high fever, shaking all over and vomiting nonbloody. Pt complains of no pain. He lives at home with wife. Previous urine cultures over the last couple of months reviewed, cipro appropriate. Repeat culture data sent in ED. Pt has been nauseas but no vomiting in the ED. Has received 2 liters of ivf, and sepsis protocol. Temp is still over 103. Unknown when last time catheter replaced. Suppose to have urology appt tomorrow for f/u of the indwelling catheter for urinary retention.    PT Comments    Pt has been NPO all day, waiting on Urologist.  He reports increased fatigue.  He was able to tolerate gentle therapeutic exercise to both LEs.  He has no further pain in the medial right knee and all exercise is done actively.  He was able to transfer to the EOB with no assistance but he was too weak to walk and required mod assist to transfer with a walker to the chair.  He was only able to tolerated sitting for about 15 minutes.  BP is decreased to 105/50, O2 sat=99%.  I would expect that once he is able to eat and drink that his strength will return to normal.  At this point, I do not think that his wife could manage him if he is discharged this afternoon.  RN was alerted to my concerns.  Follow Up Recommendations  Home health PT     Equipment Recommendations  None recommended by PT    Recommendations for Other Services  none     Precautions / Restrictions Precautions Precautions: Fall Required Braces or Orthoses: Knee Immobilizer - Right Knee Immobilizer - Right: On when out of bed or walking Restrictions RLE Weight Bearing: Weight bearing as tolerated     Mobility  Bed Mobility Overal bed mobility: Modified Independent Bed Mobility: Supine to Sit     Supine to sit: Min guard        Transfers Overall transfer level: Needs assistance Equipment used: Rolling walker (2 wheeled) Transfers: Sit to/from Stand Sit to Stand: Min assist Stand pivot transfers: Mod assist       General transfer comment: used walker for transfer...had difficulty completing the transfer due to increased weakness today  Ambulation/Gait                 Stairs            Wheelchair Mobility    Modified Rankin (Stroke Patients Only)       Balance                                    Cognition Arousal/Alertness: Awake/alert Behavior During Therapy: WFL for tasks assessed/performed Overall Cognitive Status: Within Functional Limits for tasks assessed                      Exercises General Exercises - Lower Extremity Ankle Circles/Pumps: AROM;Both;10 reps;Supine Quad Sets: AROM;Both;10 reps;Supine Gluteal Sets: AROM;Both;10 reps;Supine Short Arc Quad: Supine;10 reps;Both;AROM Heel Slides: AROM;Both;10 reps;Supine Hip ABduction/ADduction: AROM;Both;10 reps;Supine    General Comments        Pertinent Vitals/Pain Pain Assessment: No/denies pain  Home Living                      Prior Function            PT Goals (current goals can now be found in the care plan section) Progress towards PT goals: Not progressing toward goals - comment (increased weakness...has been NPO all day)    Frequency  Min 3X/week    PT Plan Current plan remains appropriate    Co-evaluation             End of Session Equipment Utilized During Treatment: Gait belt;Right knee immobilizer Activity Tolerance: Patient limited by fatigue Patient left: with call bell/phone within reach;with family/visitor present;with nursing/sitter in room;in bed;with bed alarm set     Time: 1400-1449 PT Time Calculation  (min) (ACUTE ONLY): 49 min  Charges:  $Therapeutic Exercise: 8-22 mins $Therapeutic Activity: 23-37 mins                    G CodesSable Feil 03/26/2015, 2:54 PM

## 2015-03-08 NOTE — Plan of Care (Signed)
Problem: Phase I Progression Outcomes Goal: Tolerating diet Outcome: Progressing Patient is NPO at this time, but is asking to eat.

## 2015-03-08 NOTE — Plan of Care (Signed)
Problem: Phase I Progression Outcomes Goal: Voiding-avoid urinary catheter unless indicated Outcome: Not Applicable Date Met:  53/61/44 Patient will be discharged with his foley still in place.

## 2015-03-08 NOTE — Progress Notes (Signed)
[name] PROGRESS NOTE  Douglas [name] Douglas [name] YBW:[id number] DOB: 1931/08/11 DOA: 02/26/2015 PCP: [name]., MD  Assessment/Plan: Sepsis 2/2 UTI -Sepsis parameters improved. -He has completed 7 days of cefepime -He has been afebrile and clinically improved. Will discontinue antibiotics  Chronic Urinary Retention -Continue foley/flomax/oxybutinin. -Appreciate GU follow up. -Foley has been exchanged twice this admission due to clogging from blood clots. -May need suprapubic catheters in the future. -He has recurrent gross hematuria - Discussed with Dr. Roni Bread today. Recommendations are for outpatient follow-up with urology in 1 week for voiding trial.  Acute on CKD Stage IV -With obstructive uropathy. -Cr appears to have returned to baseline -Appreciate renal and GU following. -Appears to mildly dehydrated today. We'll give gentle IV fluids and reduce torsemide to 20 mg daily  Clostridium difficile enteritis. - stool tested positive for C diff - patient  on flagyl - no significant diarrhea at this time.   Acute on chronic anemia -may have a component of acute blood loss from hematuria - transfused 1 unit prbc 3/8 - post transfusion hemoglobin has improved - continue to follow, especially in the setting of ongoing hematuria  Diabetic Gastroparesis -On reglan. -No n/v.  Acute Metabolic Encephalopathy -Resolved. -Secondary to sepsis picture.  Recent Right Femur Fracture --WBAT per Dr. [name].  Right knee pain. -Uric acid normal. X-ray of the knee unremarkable. Venous Dopplers negative for DVT. Pain has appeared to resolve  Code Status: Partial  Family Communication: Wife and daughter at bedside updated on plan of care  Disposition Plan: home with home health services   Consultants:  Renal  GU   Antibiotics:  Cefepime 3/1-3/7  Flagyl 3/8  Subjective: No new complaints today. Family feels that he is more weak. Dizzy on  standing  Objective: Filed Vitals:   03/07/15 2045 03/08/15 0529 03/08/15 1438 03/08/15 1645  BP: 144/53 142/60 105/50 125/47  Pulse: 73 64  69  Temp: 99.2 F (37.3 C) 97.8 F (36.6 C)  98 F (36.7 C)  TempSrc: Oral Axillary  Oral  Resp: 18 18  18   Height:      Weight:  58.559 kg (129 lb 1.6 oz)    SpO2: 98% 95%  96%    Intake/Output Summary (Last 24 hours) at 03/08/15 1930 Last data filed at 03/08/15 1850  Gross per 24 hour  Intake    360 ml  Output   1650 ml  Net  -1290 ml   Filed Weights   03/06/15 0631 03/07/15 0617 03/08/15 0529  Weight: 60.691 kg (133 lb 12.8 oz) 60.2 kg (132 lb 11.5 oz) 58.559 kg (129 lb 1.6 oz)    Exam:   General:  AA Ox3  Cardiovascular: RRR  Respiratory: CTA B  Abdomen: S/NT/ND/+BS  Extremities: No significant edema bilaterally  Neurologic:  Non-focal  Data Reviewed: Basic Metabolic Panel:  Recent Labs Lab 03/03/15 0617 03/04/15 0616 03/05/15 0741 03/06/15 0649 03/07/15 0720 03/08/15 0658  NA 141 142 142 143 140 139  K 4.1 4.0 3.6 3.5 3.6 3.8  CL 111 112 108 106 104 101  CO2 22 23 25 27 27 28   GLUCOSE 157* 145* 125* 127* 120* 110*  BUN 50* 45* 39* 36* 34* 34*  CREATININE 2.81* 2.78* 2.64* 2.62* 2.68* 2.87*  CALCIUM 7.7* 7.7* 7.5* 7.7* 7.6* 7.6*  PHOS 3.5  --   --   --   --   --    Liver Function Tests:  Recent Labs Lab 03/07/15 0720  ALBUMIN 2.7*  No results for input(s): LIPASE, AMYLASE in the last 168 hours. No results for input(s): AMMONIA in the last 168 hours. CBC:  Recent Labs Lab 03/04/15 0616 03/05/15 0741 03/06/15 0649 03/07/15 0720 03/08/15 0658  WBC 6.8 7.2 8.3 8.2 8.5  HGB 7.8* 7.1* 9.0* 8.8* 9.5*  HCT 24.0* 22.3* 27.9* 27.9* 30.0*  MCV 89.2 88.1 87.7 88.3 88.8  PLT 144* 160 191 188 199   Cardiac Enzymes: No results for input(s): CKTOTAL, CKMB, CKMBINDEX, TROPONINI in the last 168 hours. BNP (last 3 results)  Recent Labs  02/26/15 1726  BNP 187.0*    ProBNP (last 3 results) No  results for input(s): PROBNP in the last 8760 hours.  CBG:  Recent Labs Lab 03/07/15 1724 03/07/15 2136 03/08/15 0744 03/08/15 1140 03/08/15 1644  GLUCAP 178* 119* 107* 112* 157*    Recent Results (from the past 240 hour(s))  Urine culture     Status: None   Collection Time: 02/26/15 10:58 PM  Result Value Ref Range Status   Specimen Description URINE, CLEAN CATCH  Final   Special Requests NONE  Final   Colony Count NO GROWTH Performed at [facility]   Final   Culture NO GROWTH Performed at [facility]   Final   Report Status 02/28/2015 FINAL  Final  Clostridium Difficile by PCR     Status: Abnormal   Collection Time: 03/04/15  6:05 PM  Result Value Ref Range Status   C difficile by pcr POSITIVE (A) NEGATIVE Final    Comment: RESULT CALLED TO, READ BACK BY AND VERIFIED WITH: JONES,D AT 2200 ON 03/04/2015 BY ISLEY,B   MRSA PCR Screening     Status: None   Collection Time: 03/07/15 11:40 PM  Result Value Ref Range Status   MRSA by PCR NEGATIVE NEGATIVE Final    Comment:        The GeneXpert MRSA Assay (FDA approved for NASAL specimens only), is one component of a comprehensive MRSA colonization surveillance program. It is not intended to diagnose MRSA infection nor to guide or monitor treatment for MRSA infections.      Studies: US Venous Img Lower Unilateral Right  03/08/2015   CLINICAL DATA:  Right lower extremity pain and swelling for 10 days. History of fracture right femur approximately 1 month ago. Evaluate for DVT.  EXAM: RIGHT LOWER EXTREMITY VENOUS DOPPLER ULTRASOUND  TECHNIQUE: Gray-scale sonography with graded compression, as well as color Doppler and duplex ultrasound were performed to evaluate the lower extremity deep venous systems from the level of the common femoral vein and including the common femoral, femoral, profunda femoral, popliteal and calf veins including the posterior tibial, peroneal and gastrocnemius veins when visible.  The superficial great saphenous vein was also interrogated. Spectral Doppler was utilized to evaluate flow at rest and with distal augmentation maneuvers in the common femoral, femoral and popliteal veins.  COMPARISON:  Right knee radiographs - 01/17/2015; 03/07/2015  FINDINGS: Contralateral Common Femoral Vein: Respiratory phasicity is normal and symmetric with the symptomatic side. No evidence of thrombus. Normal compressibility.  Common Femoral Vein: No evidence of thrombus. Normal compressibility, respiratory phasicity and response to augmentation.  Saphenofemoral Junction: No evidence of thrombus. Normal compressibility and flow on color Doppler imaging.  Profunda Femoral Vein: No evidence of thrombus. Normal compressibility and flow on color Doppler imaging.  Femoral Vein: No evidence of thrombus. Normal compressibility, respiratory phasicity and response to augmentation.  Popliteal Vein: No evidence of thrombus. Normal compressibility, respiratory phasicity and response  to augmentation.  Calf Veins: No evidence of thrombus. Normal compressibility and flow on color Doppler imaging.  Superficial Great Saphenous Vein: No evidence of thrombus. Normal compressibility and flow on color Doppler imaging.  Venous Reflux:  None.  Other Findings:  None.  IMPRESSION: No evidence of DVT within the right lower extremity.   Electronically Signed   By: Sandi Mariscal M.D.   On: 03/08/2015 15:27   Dg Knee Right Port  03/07/2015   CLINICAL DATA:  Right knee pain and swelling for 2 days. No new injury. Injury in January. IM nail to the right femur down on 01/18/2015. History of diabetes and prostate cancer.  EXAM: PORTABLE RIGHT KNEE - 1-2 VIEW  COMPARISON:  Right knee 01/17/2015.  Fluoroscopy 01/18/2015  FINDINGS: Interval placement of edge medullary rod with 4 distal locking screws in the distal femur. Healing oblique fracture of the distal femoral metaphysis. Fracture line remains present but callus formation is present  consistent with healing. Near-anatomic alignment is demonstrated. There is a tiny displaced butterfly fragment over the lateral screw heads. This appears to been present on the prior study and is likely unchanged. Probable ligamentous calcification over the anterior knee. Vascular calcifications. Old healed fracture deformity of the proximal fibula.  IMPRESSION: Postoperative changes with internal fixation of a healing fracture of the distal femoral metaphysis. Old fracture of the proximal fibula. No definite acute bony abnormality.   Electronically Signed   By: Lucienne Capers M.D.   On: 03/07/2015 19:33    Scheduled Meds: . atorvastatin  40 mg Oral Daily  . Darbepoetin Alfa  60 mcg Subcutaneous Q7 days  . gabapentin  300 mg Oral QHS  . insulin aspart  0-9 Units Subcutaneous TID WC  . ketorolac  1 drop Both Eyes TID  . metoCLOPramide  5 mg Oral TID AC  . metoprolol tartrate  25 mg Oral BID  . metroNIDAZOLE  500 mg Oral 3 times per day  . polyethylene glycol  17 g Oral BID  . saccharomyces boulardii  250 mg Oral BID  . tamsulosin  0.4 mg Oral Daily  . [START ON 03/09/2015] torsemide  20 mg Oral Daily   Continuous Infusions: . sodium chloride      Principal Problem:   Sepsis secondary to UTI Active Problems:   Acute renal failure   Tachycardia   Altered mental state   CKD (chronic kidney disease) stage 4, GFR 15-29 ml/min   Anemia, chronic disease   Encephalopathy   Diabetes mellitus, controlled   Urinary tract infectious disease   Urinary retention   Gastroparesis due to DM: DELAYED GASTRIC EMPTYING STUDY 02/08/15   Enteritis due to Clostridium difficile    Time spent: 35 minutes. Greater than 50% of this time was spent in direct contact with the patient coordinating care.    Adriell Polansky  [name] Pager [phone]  If 7PM-7AM, please contact night-coverage at [email or web address], password [facility] 03/08/2015, 7:30 PM  LOS: 10 days

## 2015-03-08 NOTE — Progress Notes (Signed)
Late Entry:  Notified Dr. Roderic Palau of [place] with PT concerns.  She voiced to me that she was concerned about the patient going home today due to excessive weakness.  She was concerned about the patients SBP 140  Dropping to 105 when she checked it.  I voiced to her I would notify the MD.  The MD verbalized understanding and went back in to talk with the family.  The patient appeared to be in stable conditon.

## 2015-03-08 NOTE — Progress Notes (Signed)
Subjective: Interval History: Patient is feeling good. At this moment he feels hungry and wants to eat. No difficulty in breathing.  Objective: Vital signs in last 24 hours: Temp:  [97.8 F (36.6 C)-99.2 F (37.3 C)] 97.8 F (36.6 C) (03/11 0529) Pulse Rate:  [64-75] 64 (03/11 0529) Resp:  [18] 18 (03/11 0529) BP: (139-144)/(53-60) 142/60 mmHg (03/11 0529) SpO2:  [95 %-98 %] 95 % (03/11 0529) Weight:  [58.559 kg (129 lb 1.6 oz)] 58.559 kg (129 lb 1.6 oz) (03/11 0529) Weight change: -1.641 kg (-3 lb 9.9 oz)  Intake/Output from previous day: 03/10 0701 - 03/11 0700 In: 480 [P.O.:480] Out: 1000 [Urine:1000] Intake/Output this shift:    General appearance: alert, cooperative and no distress Resp: clear to auscultation bilaterally Cardio: regular rate and rhythm, S1, S2 normal, no murmur, click, rub or gallop GI: soft, non-tender; bowel sounds normal; no masses,  no organomegaly Extremities: extremities normal, atraumatic, no cyanosis or edema  Lab Results:  Recent Labs  03/07/15 0720 03/08/15 0658  WBC 8.2 8.5  HGB 8.8* 9.5*  HCT 27.9* 30.0*  PLT 188 199   BMET:   Recent Labs  03/07/15 0720 03/08/15 0658  NA 140 139  K 3.6 3.8  CL 104 101  CO2 27 28  GLUCOSE 120* 110*  BUN 34* 34*  CREATININE 2.68* 2.87*  CALCIUM 7.6* 7.6*   No results for input(s): PTH in the last 72 hours. Iron Studies:  No results for input(s): IRON, TIBC, TRANSFERRIN, FERRITIN in the last 72 hours.  Studies/Results: Dg Knee Right Port  03/07/2015   CLINICAL DATA:  Right knee pain and swelling for 2 days. No new injury. Injury in January. IM nail to the right femur down on 01/18/2015. History of diabetes and prostate cancer.  EXAM: PORTABLE RIGHT KNEE - 1-2 VIEW  COMPARISON:  Right knee 01/17/2015.  Fluoroscopy 01/18/2015  FINDINGS: Interval placement of edge medullary rod with 4 distal locking screws in the distal femur. Healing oblique fracture of the distal femoral metaphysis. Fracture  line remains present but callus formation is present consistent with healing. Near-anatomic alignment is demonstrated. There is a tiny displaced butterfly fragment over the lateral screw heads. This appears to been present on the prior study and is likely unchanged. Probable ligamentous calcification over the anterior knee. Vascular calcifications. Old healed fracture deformity of the proximal fibula.  IMPRESSION: Postoperative changes with internal fixation of a healing fracture of the distal femoral metaphysis. Old fracture of the proximal fibula. No definite acute bony abnormality.   Electronically Signed   By: Lucienne Capers M.D.   On: 03/07/2015 19:33    I have reviewed the patient's current medications.  Assessment/Plan: Problem #1 acute kidney injury: His BUN and creatinine is slightly high today. Possibly from [facility]. Patient has about 3100 mL of urine output for the last 24 hours. Problem #2 anemia: His ferritin is very high with reasonable iron saturation. His anemia could be secondary to chronic renal failure.  His hemoglobin has improved Problem #3 history of chronic renal failure: Stage IV Problem #4 diabetes Problem #5 history of prostate cancer. Patient presently with Foley catheter. Patient with hematuria but seems to be getting better. This is thought to be possibly from radiation cystitis. Problem #6 metabolic bone disease his calcium and his phosphorus is in range. Problem #7 urinary tract infection Problem #8 gastroparesis Plan: We'll DC IV Lasix and IV fluid We'll decrease Demadex to 20 mg by mouth once a day on when necessary basis.  We'll see patient in 3 weeks if he is going to be discharged.    LOS: 10 days   Helaman Mecca S 03/08/2015,11:39 AM

## 2015-03-09 DIAGNOSIS — N390 Urinary tract infection, site not specified: Secondary | ICD-10-CM | POA: Insufficient documentation

## 2015-03-09 LAB — BASIC METABOLIC PANEL
Anion gap: 9 (ref 5–15)
BUN: 42 mg/dL — ABNORMAL HIGH (ref 6–23)
CO2: 27 mmol/L (ref 19–32)
Calcium: 7.5 mg/dL — ABNORMAL LOW (ref 8.4–10.5)
Chloride: 104 mmol/L (ref 96–112)
Creatinine, Ser: 2.94 mg/dL — ABNORMAL HIGH (ref 0.50–1.35)
GFR calc non Af Amer: 19 mL/min — ABNORMAL LOW (ref >90)
GFR, EST AFRICAN AMERICAN: 22 mL/min — AB (ref >90)
Glucose, Bld: 106 mg/dL — ABNORMAL HIGH (ref 70–99)
POTASSIUM: 4 mmol/L (ref 3.5–5.1)
Sodium: 140 mmol/L (ref 135–145)

## 2015-03-09 LAB — GLUCOSE, CAPILLARY
GLUCOSE-CAPILLARY: 107 mg/dL — AB (ref 70–99)
Glucose-Capillary: 131 mg/dL — ABNORMAL HIGH (ref 70–99)

## 2015-03-09 MED ORDER — SACCHAROMYCES BOULARDII 250 MG PO CAPS: 250.0000 mg | ORAL_CAPSULE | Freq: Two times a day (BID) | ORAL | Status: DC

## 2015-03-09 MED ORDER — GABAPENTIN 300 MG PO CAPS: 300.0000 mg | ORAL_CAPSULE | Freq: Every day | ORAL | Status: AC

## 2015-03-09 MED ORDER — POLYSACCHARIDE IRON COMPLEX 150 MG PO CAPS: 150.0000 mg | ORAL_CAPSULE | Freq: Every day | ORAL | Status: AC

## 2015-03-09 MED ORDER — METRONIDAZOLE 500 MG PO TABS: 500.0000 mg | ORAL_TABLET | Freq: Three times a day (TID) | ORAL | Status: DC

## 2015-03-09 MED ORDER — TORSEMIDE 20 MG PO TABS: 20.0000 mg | ORAL_TABLET | Freq: Every day | ORAL | Status: DC

## 2015-03-09 MED ORDER — METOPROLOL TARTRATE 25 MG PO TABS: 25.0000 mg | ORAL_TABLET | Freq: Two times a day (BID) | ORAL | Status: DC

## 2015-03-09 MED ORDER — POLYETHYLENE GLYCOL 3350 17 G PO PACK: 17.0000 g | PACK | Freq: Two times a day (BID) | ORAL | Status: DC | PRN

## 2015-03-09 NOTE — Plan of Care (Signed)
Problem: Phase II Progression Outcomes Goal: Voiding independently Outcome: Not Progressing Patient remains with foley, discharging home with it in place and to follow up for voiding trial as OP

## 2015-03-09 NOTE — Progress Notes (Signed)
Patient discharged home with wife.  Instructions and medications reviewed with wife as she is caregiver to husband.  IV removed - WNL.  [place] set up per CM, [facility] called and notified and orders faxed.  Instructed to make follow up appointments as ordered.  Foley to remain in place, demonstrated/instructed on proper foley care to maintain at home and prevent infection.  Reviewed medications.  Educated on importance of hand hygiene and cleanliness to prevent spread of c.diff.  No questions at this time, patient stable to DC home.  Patient assisted off unit via [place] with NT assist.

## 2015-03-09 NOTE — Progress Notes (Signed)
Subjective: Interval History: Patient is feeling good but patient is weak according to his daughter. Yesterday an attempt was made to standing up patient felt weak and since discharge was postponed. Patient appetite is still good and no difficulty breathing  Objective: Vital signs in last 24 hours: Temp:  [98 F (36.7 C)-99.1 F (37.3 C)] 98.5 F (36.9 C) (03/12 0600) Pulse Rate:  [62-73] 62 (03/12 0600) Resp:  [18-20] 20 (03/12 0600) BP: (105-140)/(47-59) 140/59 mmHg (03/12 0600) SpO2:  [95 %-98 %] 95 % (03/12 0600) Weight:  [61.009 kg (134 lb 8 oz)] 61.009 kg (134 lb 8 oz) (03/12 0600) Weight change: 2.449 kg (5 lb 6.4 oz)  Intake/Output from previous day: 03/11 0701 - 03/12 0700 In: 360 [P.O.:360] Out: 650 [Urine:650] Intake/Output this shift:    General appearance: alert, cooperative and no distress Resp: clear to auscultation bilaterally Cardio: regular rate and rhythm, S1, S2 normal, no murmur, click, rub or gallop GI: soft, non-tender; bowel sounds normal; no masses,  no organomegaly Extremities: extremities normal, atraumatic, no cyanosis or edema  Lab Results:  Recent Labs  03/07/15 0720 03/08/15 0658  WBC 8.2 8.5  HGB 8.8* 9.5*  HCT 27.9* 30.0*  PLT 188 199   BMET:   Recent Labs  03/08/15 0658 03/09/15 0637  NA 139 140  K 3.8 4.0  CL 101 104  CO2 28 27  GLUCOSE 110* 106*  BUN 34* 42*  CREATININE 2.87* 2.94*  CALCIUM 7.6* 7.5*   No results for input(s): PTH in the last 72 hours. Iron Studies:  No results for input(s): IRON, TIBC, TRANSFERRIN, FERRITIN in the last 72 hours.  Studies/Results: US Venous Img Lower Unilateral Right  03/08/2015   CLINICAL DATA:  Right lower extremity pain and swelling for 10 days. History of fracture right femur approximately 1 month ago. Evaluate for DVT.  EXAM: RIGHT LOWER EXTREMITY VENOUS DOPPLER ULTRASOUND  TECHNIQUE: Gray-scale sonography with graded compression, as well as color Doppler and duplex ultrasound were  performed to evaluate the lower extremity deep venous systems from the level of the common femoral vein and including the common femoral, femoral, profunda femoral, popliteal and calf veins including the posterior tibial, peroneal and gastrocnemius veins when visible. The superficial great saphenous vein was also interrogated. Spectral Doppler was utilized to evaluate flow at rest and with distal augmentation maneuvers in the common femoral, femoral and popliteal veins.  COMPARISON:  Right knee radiographs - 01/17/2015; 03/07/2015  FINDINGS: Contralateral Common Femoral Vein: Respiratory phasicity is normal and symmetric with the symptomatic side. No evidence of thrombus. Normal compressibility.  Common Femoral Vein: No evidence of thrombus. Normal compressibility, respiratory phasicity and response to augmentation.  Saphenofemoral Junction: No evidence of thrombus. Normal compressibility and flow on color Doppler imaging.  Profunda Femoral Vein: No evidence of thrombus. Normal compressibility and flow on color Doppler imaging.  Femoral Vein: No evidence of thrombus. Normal compressibility, respiratory phasicity and response to augmentation.  Popliteal Vein: No evidence of thrombus. Normal compressibility, respiratory phasicity and response to augmentation.  Calf Veins: No evidence of thrombus. Normal compressibility and flow on color Doppler imaging.  Superficial Great Saphenous Vein: No evidence of thrombus. Normal compressibility and flow on color Doppler imaging.  Venous Reflux:  None.  Other Findings:  None.  IMPRESSION: No evidence of DVT within the right lower extremity.   Electronically Signed   By: Sandi Mariscal M.D.   On: 03/08/2015 15:27   Dg Knee Right Port  03/07/2015   CLINICAL DATA:  Right knee pain and swelling for 2 days. No new injury. Injury in January. IM nail to the right femur down on 01/18/2015. History of diabetes and prostate cancer.  EXAM: PORTABLE RIGHT KNEE - 1-2 VIEW  COMPARISON:  Right  knee 01/17/2015.  Fluoroscopy 01/18/2015  FINDINGS: Interval placement of edge medullary rod with 4 distal locking screws in the distal femur. Healing oblique fracture of the distal femoral metaphysis. Fracture line remains present but callus formation is present consistent with healing. Near-anatomic alignment is demonstrated. There is a tiny displaced butterfly fragment over the lateral screw heads. This appears to been present on the prior study and is likely unchanged. Probable ligamentous calcification over the anterior knee. Vascular calcifications. Old healed fracture deformity of the proximal fibula.  IMPRESSION: Postoperative changes with internal fixation of a healing fracture of the distal femoral metaphysis. Old fracture of the proximal fibula. No definite acute bony abnormality.   Electronically Signed   By: Lucienne Capers M.D.   On: 03/07/2015 19:33    I have reviewed the patient's current medications.  Assessment/Plan: Problem #1 acute kidney injury: His BUN and creatinine is slightly increasing the Lasix couple of days. Patient presently asymptomatic. Problem #2 anemia:  His hemoglobin is stable. Problem #3 history of chronic renal failure: Stage IV Problem #4 diabetes Problem #5 history of prostate cancer. His hematuria is slightly better. Still patient with Foley catheter. Problem #6 metabolic bone disease his calcium and his phosphorus is in range. Problem #7 urinary tract infection Problem #8 gastroparesis Plan: We'll continue with present management Patient does not require dialysis at this moment.   LOS: 11 days   Joseandres Mazer S 03/09/2015,8:23 AM

## 2015-03-09 NOTE — Discharge Summary (Signed)
Physician Discharge Summary  Douglas [name] NFA:[id number] DOB: July 05, 1931 DOA: 02/26/2015  PCP: [name]., MD  Admit date: 02/26/2015 Discharge date: 03/09/2015  Time spent: 40 minutes  Recommendations for Outpatient Follow-up:  1. Patient will follow-up with urology on 3/18 for voiding trial. 2. Follow-up with nephrology in 3 weeks 3. Follow-up with primary care physician in 1-2 weeks 4. Follow-up with orthopedics, Dr. [name] next week.  Discharge Diagnoses:  Principal Problem:   Sepsis secondary to UTI Active Problems:   Acute renal failure   Tachycardia   Altered mental state   CKD (chronic kidney disease) stage 4, GFR 15-29 ml/min   Anemia, chronic disease   Encephalopathy   Diabetes mellitus, controlled   Urinary tract infectious disease   Urinary retention   Gastroparesis due to DM: DELAYED GASTRIC EMPTYING STUDY 02/08/15   Enteritis due to Clostridium difficile   UTI (lower urinary tract infection)   Discharge Condition: improved  Diet recommendation: low salt, low carb  Filed Weights   03/07/15 0617 03/08/15 0529 03/09/15 0600  Weight: 60.2 kg (132 lb 11.5 oz) 58.559 kg (129 lb 1.6 oz) 61.009 kg (134 lb 8 oz)    History of present illness:  This is an 81 year old male with recent hip surgery who is admitted to the hospital with progressive altered mental status, high fevers, vomiting. He was found to have sepsis related to urinary tract infection was admitted to the hospital for further treatments.  Hospital Course:   Sepsis 2/2 UTI -Sepsis parameters improved with IV fluids and antibiotics. -He completed 7 days of cefepime in the hospital -He has been afebrile and clinically improved.   Chronic Urinary Retention -Continue foley/flomax/oxybutinin. -Seen by urology during hospital stay -Foley has been exchanged twice this admission due to clogging from blood clots by urology. -May need suprapubic catheters in the future. -He does have ongoing hematuria  and requires frequent flushing of his catheter - Follow-up with Dr. Jeffie Pollock in one week for voiding trial.  Acute on CKD Stage IV -With obstructive uropathy. -Seen by nephrology and started on IV fluids as well as Lasix. -Cr appears to have returned to baseline -Appreciate renal and GU following.  Clostridium difficile enteritis. - stool tested positive for C diff during hospital stay - patientstarted on Flagyl with good effect - no significant diarrhea at this time. -Complete 14 days of treatment and continue probiotics   Acute on chronic anemia -may have a component of acute blood loss from hematuria - transfused 1 unit prbc 3/8 - post transfusion hemoglobin was improved - continue to follow as an outpatient, especially in the setting of ongoing hematuria  Diabetic Gastroparesis -On reglan. -No n/v.  Acute Metabolic Encephalopathy -Resolved. -Secondary to sepsis picture.  Recent Right Femur Fracture --WBAT per Dr. [name]. Follow-up with orthopedics as an outpatient   Consultations:  Urology  nephrology  Discharge Exam: Filed Vitals:   03/09/15 0600  BP: 140/59  Pulse: 62  Temp: 98.5 F (36.9 C)  Resp: 20    General: NAD Cardiovascular: S1, s2 RRR Respiratory: CTA B  Discharge Instructions   Discharge Instructions    Call MD for:  difficulty breathing, headache or visual disturbances    Complete by:  As directed      Call MD for:  extreme fatigue    Complete by:  As directed      Call MD for:  temperature >100.4    Complete by:  As directed      Diet - low sodium  heart healthy    Complete by:  As directed      Increase activity slowly    Complete by:  As directed           Discharge Medication List as of 03/09/2015 12:16 PM    START taking these medications   Details  metoprolol tartrate (LOPRESSOR) 25 MG tablet Take 1 tablet (25 mg total) by mouth 2 (two) times daily., Starting 03/09/2015, Until Discontinued, Normal    metroNIDAZOLE (FLAGYL)  500 MG tablet Take 1 tablet (500 mg total) by mouth 3 (three) times daily., Starting 03/09/2015, Until Discontinued, Normal    saccharomyces boulardii (FLORASTOR) 250 MG capsule Take 1 capsule (250 mg total) by mouth 2 (two) times daily., Starting 03/09/2015, Until Discontinued, Normal    torsemide (DEMADEX) 20 MG tablet Take 1 tablet (20 mg total) by mouth daily., Starting 03/09/2015, Until Discontinued, Normal      CONTINUE these medications which have CHANGED   Details  gabapentin (NEURONTIN) 300 MG capsule Take 1 capsule (300 mg total) by mouth at bedtime., Starting 03/09/2015, Until Discontinued, Normal    iron polysaccharides (NIFEREX) 150 MG capsule Take 1 capsule (150 mg total) by mouth daily., Starting 03/09/2015, Until Discontinued, No Print    polyethylene glycol (MIRALAX / GLYCOLAX) packet Take 17 g by mouth 2 (two) times daily as needed for moderate constipation. Hold if develops diarrhea., Starting 03/09/2015, Until Discontinued, Normal      CONTINUE these medications which have NOT CHANGED   Details  acetaminophen (TYLENOL) 500 MG tablet Take 500 mg by mouth every 6 (six) hours as needed for mild pain or moderate pain., Until Discontinued, Historical Med    atorvastatin (LIPITOR) 40 MG tablet Take 40 mg by mouth daily., Until Discontinued, Historical Med    insulin aspart (NOVOLOG) 100 UNIT/ML injection insulin aspart (novoLOG) injection 0-15 Units 0-15 Units, Subcutaneous, 3 times daily with meals CBG 70 - 120: 0 units CBG 121 - 150: 2 units CBG 151 - 200: 3 units CBG 201 - 250: 5 units CBG 251 - 300: 8 units CBG 301 - 350: 11 units CBG 351 - 4 00: 15 units CBG > 400: call MD, No Print    insulin detemir (LEVEMIR) 100 UNIT/ML injection Inject 0.1 mLs (10 Units total) into the skin at bedtime., Starting 02/09/2015, Until Discontinued, Print    ketorolac (ACULAR) 0.5 % ophthalmic solution Place 1 drop into both eyes 3 (three) times daily., Until Discontinued, Historical Med     metoCLOPramide (REGLAN) 5 MG tablet Take 1 tablet (5 mg total) by mouth 4 (four) times daily -  before meals and at bedtime., Starting 02/09/2015, Until Discontinued, Print    oxybutynin (DITROPAN) 5 MG tablet Take 1 tablet (5 mg total) by mouth every 8 (eight) hours as needed for bladder spasms., Starting 02/09/2015, Until Discontinued, Print    tamsulosin (FLOMAX) 0.4 MG CAPS capsule Take 1 capsule (0.4 mg total) by mouth daily., Starting 02/09/2015, Until Discontinued, Print    traMADol (ULTRAM) 50 MG tablet Take 2 tablets (100 mg total) by mouth every 6 (six) hours as needed for severe pain., Starting 02/09/2015, Until Discontinued, Print      STOP taking these medications     amLODipine (NORVASC) 10 MG tablet      ciprofloxacin (CIPRO) 250 MG tablet      phenazopyridine (PYRIDIUM) 200 MG tablet      senna-docusate (SENOKOT-S) 8.6-50 MG per tablet      pantoprazole (PROTONIX) 40 MG tablet  No Known Allergies Follow-up Information    Follow up with [facility].   Contact information:   [address] [place] [address] [phone]       Follow up with [facility], MD In 3 weeks.   Specialty:  Nephrology   Contact information:   [address] [address] [phone]       Follow up with Malka So, MD On 03/15/2015.   Specialty:  Urology   Why:  Office will call for to set up a morning appoint for a voiding trial.    Contact information:   [address] [place] [address] [phone]       Follow up with Renette Butters, MD.   Specialty:  Orthopedic Surgery   Why:  call on monday for appointment   Contact information:   [place] [place] [id number] [phone]        The results of significant diagnostics from this hospitalization (including imaging, microbiology, ancillary and laboratory) are listed below for reference.    Significant Diagnostic  Studies: Nm Gastric Emptying  02/08/2015   CLINICAL DATA:  History of reflux, bloating, and early satiety  EXAM: NUCLEAR MEDICINE GASTRIC EMPTYING SCAN  TECHNIQUE: After oral ingestion of radiolabeled meal, sequential abdominal images were obtained for 4 hours. Percentage of activity emptying the stomach calculated at 1 hour, 2 hour, 3 hour, and 4 hours.  RADIOPHARMACEUTICALS:  2 mCi Technetium 99-m labeled sulfur colloid into egg whites, 6 oz water, and 1 slice of toast  COMPARISON:  None.  FINDINGS: Expected location of the stomach in the left upper quadrant. Ingested meal empties the stomach gradually over the course of the study.  18% emptied at 1 hr ( normal >= 10%)  8% emptied at 2 hr ( normal >= 40%)  16% emptied at 3 hr ( normal >= 70%)  30% emptied at 4 hr ( normal >= 90%)  IMPRESSION: Delayed gastric emptying study.   Electronically Signed   By: David  Martinique   On: 02/08/2015 12:19   US Renal  02/27/2015   CLINICAL DATA:  Sepsis secondary to UTI.  Chronic kidney disease.  EXAM: RENAL/URINARY TRACT ULTRASOUND COMPLETE  COMPARISON:  None.  FINDINGS: Right Kidney:  Length: 11.0 cm. Mild renal cortical thinning. Echogenicity within normal limits. No mass or hydronephrosis visualized.  Left Kidney:  Length: 10.3 cm. Mild renal cortical thinning. Echogenicity within normal limits. No mass or hydronephrosis visualized.  Bladder:  Decompressed with Foley in place.  IMPRESSION: Normal size kidneys with mild bilateral renal cortical thinning.   Electronically Signed   By: Marin Olp M.D.   On: 02/27/2015 12:47   US Venous Img Lower Unilateral Right  03/08/2015   CLINICAL DATA:  Right lower extremity pain and swelling for 10 days. History of fracture right femur approximately 1 month ago. Evaluate for DVT.  EXAM: RIGHT LOWER EXTREMITY VENOUS DOPPLER ULTRASOUND  TECHNIQUE: Gray-scale sonography with graded compression, as well as color Doppler and duplex ultrasound were performed to evaluate the lower  extremity deep venous systems from the level of the common femoral vein and including the common femoral, femoral, profunda femoral, popliteal and calf veins including the posterior tibial, peroneal and gastrocnemius veins when visible. The superficial great saphenous vein was also interrogated. Spectral Doppler was utilized to evaluate flow at rest and with distal augmentation maneuvers in the common femoral, femoral and popliteal veins.  COMPARISON:  Right  knee radiographs - 01/17/2015; 03/07/2015  FINDINGS: Contralateral Common Femoral Vein: Respiratory phasicity is normal and symmetric with the symptomatic side. No evidence of thrombus. Normal compressibility.  Common Femoral Vein: No evidence of thrombus. Normal compressibility, respiratory phasicity and response to augmentation.  Saphenofemoral Junction: No evidence of thrombus. Normal compressibility and flow on color Doppler imaging.  Profunda Femoral Vein: No evidence of thrombus. Normal compressibility and flow on color Doppler imaging.  Femoral Vein: No evidence of thrombus. Normal compressibility, respiratory phasicity and response to augmentation.  Popliteal Vein: No evidence of thrombus. Normal compressibility, respiratory phasicity and response to augmentation.  Calf Veins: No evidence of thrombus. Normal compressibility and flow on color Doppler imaging.  Superficial Great Saphenous Vein: No evidence of thrombus. Normal compressibility and flow on color Doppler imaging.  Venous Reflux:  None.  Other Findings:  None.  IMPRESSION: No evidence of DVT within the right lower extremity.   Electronically Signed   By: Sandi Mariscal M.D.   On: 03/08/2015 15:27   Dg Chest Port 1 View  03/01/2015   CLINICAL DATA:  Shortness of breath  EXAM: PORTABLE CHEST - 1 VIEW  COMPARISON:  02/26/2015  FINDINGS: Cardiac shadow is stable. Bibasilar atelectatic changes are again noted. Increased vascular congestion with interstitial edema is now seen consistent with mild CHF.  No bony abnormality is noted.  IMPRESSION: New changes of CHF.   Electronically Signed   By: Inez Catalina M.D.   On: 03/01/2015 08:31   Dg Chest Port 1 View  02/26/2015   CLINICAL DATA:  Altered mental status and emesis.  EXAM: PORTABLE CHEST - 1 VIEW  COMPARISON:  02/05/2015  FINDINGS: Chronic elevation of the right hemidiaphragm. Linear densities at lung bases are suggestive for atelectasis. Upper lungs are clear. Heart size is normal. No focal airspace disease.  IMPRESSION: Bibasilar atelectasis.   Electronically Signed   By: Markus Daft M.D.   On: 02/26/2015 17:44   Dg Knee Right Port  03/07/2015   CLINICAL DATA:  Right knee pain and swelling for 2 days. No new injury. Injury in January. IM nail to the right femur down on 01/18/2015. History of diabetes and prostate cancer.  EXAM: PORTABLE RIGHT KNEE - 1-2 VIEW  COMPARISON:  Right knee 01/17/2015.  Fluoroscopy 01/18/2015  FINDINGS: Interval placement of edge medullary rod with 4 distal locking screws in the distal femur. Healing oblique fracture of the distal femoral metaphysis. Fracture line remains present but callus formation is present consistent with healing. Near-anatomic alignment is demonstrated. There is a tiny displaced butterfly fragment over the lateral screw heads. This appears to been present on the prior study and is likely unchanged. Probable ligamentous calcification over the anterior knee. Vascular calcifications. Old healed fracture deformity of the proximal fibula.  IMPRESSION: Postoperative changes with internal fixation of a healing fracture of the distal femoral metaphysis. Old fracture of the proximal fibula. No definite acute bony abnormality.   Electronically Signed   By: Lucienne Capers M.D.   On: 03/07/2015 19:33   Dg Femur, Min 2 Views Right  02/27/2015   CLINICAL DATA:  Fracture follow-up.  EXAM: RIGHT FEMUR 2 VIEWS  COMPARISON:  01/18/2015 and 01/17/2015  FINDINGS: Mild degenerate change of the right hip. Right femoral  intramedullary nail bridges patient's distal femoral fracture as hardware is intact with near anatomic alignment about the fracture site. Mild bridging callus formation is evident. Remainder of the exam is unchanged.  IMPRESSION: Distal right femoral fracture post internal fixation with  hardware intact and evidence of mild interval healing.   Electronically Signed   By: Marin Olp M.D.   On: 02/27/2015 11:45    Microbiology: Recent Results (from the past 240 hour(s))  Clostridium Difficile by PCR     Status: Abnormal   Collection Time: 03/04/15  6:05 PM  Result Value Ref Range Status   C difficile by pcr POSITIVE (A) NEGATIVE Final    Comment: RESULT CALLED TO, READ BACK BY AND VERIFIED WITH: JONES,D AT 2200 ON 03/04/2015 BY ISLEY,B   MRSA PCR Screening     Status: None   Collection Time: 03/07/15 11:40 PM  Result Value Ref Range Status   MRSA by PCR NEGATIVE NEGATIVE Final    Comment:        The GeneXpert MRSA Assay (FDA approved for NASAL specimens only), is one component of a comprehensive MRSA colonization surveillance program. It is not intended to diagnose MRSA infection nor to guide or monitor treatment for MRSA infections.      Labs: Basic Metabolic Panel:  Recent Labs Lab 03/03/15 0617  03/05/15 0741 03/06/15 9983 03/07/15 0720 03/08/15 0658 03/09/15 0637  NA 141  < > 142 143 140 139 140  K 4.1  < > 3.6 3.5 3.6 3.8 4.0  CL 111  < > 108 106 104 101 104  CO2 22  < > 25 27 27 28 27   GLUCOSE 157*  < > 125* 127* 120* 110* 106*  BUN 50*  < > 39* 36* 34* 34* 42*  CREATININE 2.81*  < > 2.64* 2.62* 2.68* 2.87* 2.94*  CALCIUM 7.7*  < > 7.5* 7.7* 7.6* 7.6* 7.5*  PHOS 3.5  --   --   --   --   --   --   < > = values in this interval not displayed. Liver Function Tests:  Recent Labs Lab 03/07/15 0720  ALBUMIN 2.7*   No results for input(s): LIPASE, AMYLASE in the last 168 hours. No results for input(s): AMMONIA in the last 168 hours. CBC:  Recent Labs Lab  03/04/15 0616 03/05/15 0741 03/06/15 0649 03/07/15 0720 03/08/15 0658  WBC 6.8 7.2 8.3 8.2 8.5  HGB 7.8* 7.1* 9.0* 8.8* 9.5*  HCT 24.0* 22.3* 27.9* 27.9* 30.0*  MCV 89.2 88.1 87.7 88.3 88.8  PLT 144* 160 191 188 199   Cardiac Enzymes: No results for input(s): CKTOTAL, CKMB, CKMBINDEX, TROPONINI in the last 168 hours. BNP: BNP (last 3 results)  Recent Labs  02/26/15 1726  BNP 187.0*    ProBNP (last 3 results) No results for input(s): PROBNP in the last 8760 hours.  CBG:  Recent Labs Lab 03/08/15 1140 03/08/15 1644 03/08/15 2127 03/09/15 0732 03/09/15 1147  GLUCAP 112* 157* 144* 107* 131*       Signed:  Esequiel Kleinfelter  [name] 03/09/2015, 6:59 PM

## 2015-03-11 NOTE — Progress Notes (Signed)
UR chart review completed.  

## 2015-03-15 ENCOUNTER — Inpatient Hospital Stay (HOSPITAL_COMMUNITY)
Admission: EM | Admit: 2015-03-15 | Discharge: 2015-03-20 | DRG: 698 | Disposition: A | Discharge status: Home Health | Payer: Medicare Other | Attending: Internal Medicine | Admitting: Internal Medicine

## 2015-03-15 DIAGNOSIS — A0472 Enterocolitis due to Clostridium difficile, not specified as recurrent: Secondary | ICD-10-CM | POA: Diagnosis present

## 2015-03-15 DIAGNOSIS — N289 Disorder of kidney and ureter, unspecified: Secondary | ICD-10-CM

## 2015-03-15 DIAGNOSIS — Z794 Long term (current) use of insulin: Secondary | ICD-10-CM

## 2015-03-15 DIAGNOSIS — K3184 Gastroparesis: Secondary | ICD-10-CM | POA: Diagnosis present

## 2015-03-15 DIAGNOSIS — E43 Unspecified severe protein-calorie malnutrition: Secondary | ICD-10-CM | POA: Diagnosis present

## 2015-03-15 DIAGNOSIS — E785 Hyperlipidemia, unspecified: Secondary | ICD-10-CM | POA: Diagnosis present

## 2015-03-15 DIAGNOSIS — H905 Unspecified sensorineural hearing loss: Secondary | ICD-10-CM | POA: Diagnosis present

## 2015-03-15 DIAGNOSIS — A419 Sepsis, unspecified organism: Secondary | ICD-10-CM | POA: Diagnosis present

## 2015-03-15 DIAGNOSIS — Z87440 Personal history of urinary (tract) infections: Secondary | ICD-10-CM

## 2015-03-15 DIAGNOSIS — E1143 Type 2 diabetes mellitus with diabetic autonomic (poly)neuropathy: Secondary | ICD-10-CM | POA: Diagnosis present

## 2015-03-15 DIAGNOSIS — E119 Type 2 diabetes mellitus without complications: Secondary | ICD-10-CM

## 2015-03-15 DIAGNOSIS — T8351XA Infection and inflammatory reaction due to indwelling urinary catheter, initial encounter: Secondary | ICD-10-CM | POA: Diagnosis not present

## 2015-03-15 DIAGNOSIS — Z8546 Personal history of malignant neoplasm of prostate: Secondary | ICD-10-CM

## 2015-03-15 DIAGNOSIS — N390 Urinary tract infection, site not specified: Secondary | ICD-10-CM | POA: Diagnosis present

## 2015-03-15 DIAGNOSIS — I129 Hypertensive chronic kidney disease with stage 1 through stage 4 chronic kidney disease, or unspecified chronic kidney disease: Secondary | ICD-10-CM | POA: Diagnosis present

## 2015-03-15 DIAGNOSIS — N320 Bladder-neck obstruction: Secondary | ICD-10-CM | POA: Diagnosis present

## 2015-03-15 DIAGNOSIS — N179 Acute kidney failure, unspecified: Secondary | ICD-10-CM | POA: Diagnosis present

## 2015-03-15 DIAGNOSIS — D638 Anemia in other chronic diseases classified elsewhere: Secondary | ICD-10-CM | POA: Diagnosis present

## 2015-03-15 DIAGNOSIS — S72409A Unspecified fracture of lower end of unspecified femur, initial encounter for closed fracture: Secondary | ICD-10-CM | POA: Diagnosis present

## 2015-03-15 DIAGNOSIS — N184 Chronic kidney disease, stage 4 (severe): Secondary | ICD-10-CM | POA: Diagnosis present

## 2015-03-15 DIAGNOSIS — E860 Dehydration: Secondary | ICD-10-CM | POA: Diagnosis present

## 2015-03-15 DIAGNOSIS — Y846 Urinary catheterization as the cause of abnormal reaction of the patient, or of later complication, without mention of misadventure at the time of the procedure: Secondary | ICD-10-CM | POA: Diagnosis present

## 2015-03-15 LAB — BASIC METABOLIC PANEL
Anion gap: 13 (ref 5–15)
BUN: 61 mg/dL — ABNORMAL HIGH (ref 6–23)
CO2: 25 mmol/L (ref 19–32)
Calcium: 8.2 mg/dL — ABNORMAL LOW (ref 8.4–10.5)
Chloride: 98 mmol/L (ref 96–112)
Creatinine, Ser: 4 mg/dL — ABNORMAL HIGH (ref 0.50–1.35)
GFR calc Af Amer: 15 mL/min — ABNORMAL LOW (ref >90)
GFR, EST NON AFRICAN AMERICAN: 13 mL/min — AB (ref >90)
GLUCOSE: 62 mg/dL — AB (ref 70–99)
POTASSIUM: 4.8 mmol/L (ref 3.5–5.1)
SODIUM: 136 mmol/L (ref 135–145)

## 2015-03-15 LAB — CBC WITH DIFFERENTIAL/PLATELET
BASOS PCT: 0 % (ref 0–1)
Basophils Absolute: 0 10*3/uL (ref 0.0–0.1)
Eosinophils Absolute: 0.5 10*3/uL (ref 0.0–0.7)
Eosinophils Relative: 5 % (ref 0–5)
HEMATOCRIT: 31.5 % — AB (ref 39.0–52.0)
Hemoglobin: 9.8 g/dL — ABNORMAL LOW (ref 13.0–17.0)
Lymphocytes Relative: 15 % (ref 12–46)
Lymphs Abs: 1.3 10*3/uL (ref 0.7–4.0)
MCH: 28 pg (ref 26.0–34.0)
MCHC: 31.1 g/dL (ref 30.0–36.0)
MCV: 90 fL (ref 78.0–100.0)
MONO ABS: 0.7 10*3/uL (ref 0.1–1.0)
MONOS PCT: 8 % (ref 3–12)
NEUTROS PCT: 72 % (ref 43–77)
Neutro Abs: 6.3 10*3/uL (ref 1.7–7.7)
Platelets: 233 10*3/uL (ref 150–400)
RBC: 3.5 MIL/uL — ABNORMAL LOW (ref 4.22–5.81)
RDW: 14.1 % (ref 11.5–15.5)
WBC: 8.8 10*3/uL (ref 4.0–10.5)

## 2015-03-15 LAB — URINALYSIS, ROUTINE W REFLEX MICROSCOPIC
GLUCOSE, UA: NEGATIVE mg/dL
NITRITE: POSITIVE — AB
Specific Gravity, Urine: 1.02 (ref 1.005–1.030)
Urobilinogen, UA: 2 mg/dL — ABNORMAL HIGH (ref 0.0–1.0)
pH: 6.5 (ref 5.0–8.0)

## 2015-03-15 LAB — URINE MICROSCOPIC-ADD ON

## 2015-03-15 MED ORDER — SODIUM CHLORIDE 0.9 % IV BOLUS (SEPSIS)
500.0000 mL | Freq: Once | INTRAVENOUS | Status: AC
  Administered 2015-03-15: 500 mL via INTRAVENOUS

## 2015-03-15 MED ORDER — DEXTROSE 5 % IV SOLN
1.0000 g | Freq: Once | INTRAVENOUS | Status: AC
  Administered 2015-03-15: 1 g via INTRAVENOUS
  Filled 2015-03-15: qty 10

## 2015-03-15 MED ORDER — SODIUM CHLORIDE 0.9 % IV SOLN
Freq: Once | INTRAVENOUS | Status: AC
Start: 2015-03-15 — End: 2015-03-15
  Administered 2015-03-15: 21:00:00 via INTRAVENOUS

## 2015-03-15 NOTE — ED Notes (Signed)
Per family members, patient has a foley with foul smelling, dark urine x couple of days.  Pt here for eval for possible uti

## 2015-03-15 NOTE — ED Provider Notes (Signed)
CSN: [id number]     Arrival date & time 03/15/15  1955 History  This chart was scribed for Douglas Rank, MD by Tula Nakayama, ED Scribe. This patient was seen in room APAH4/APAH4 and the patient's care was started at 8:11 PM.    Chief Complaint  Patient presents with  . Urinary Tract Infection   The history is provided by the patient. No language interpreter was used.    HPI Comments: Douglas [name] is a 81 y.o. male with a history of HTN, DM, hyperlipidemia, UTI and C. Diff who presents to the Emergency Department with a previously diagnosed urine infection. Per nursing note, pt's family reports his catheter has malodorous dark urine for the last couple of days. Pt states he was referred to the ED by home health for treatment of his UTI and to "be checked out." He was hospitalized on 3/1 for sepsis related to urinary tract infection. He notes decreased appetite and inability to ambulate since hospitalization. Pt denies any current complaints. He also denies fever, vomiting, diarrhea, dysuria, CP, SOB and abdominal pain as associated symptoms.  Past Medical History  Diagnosis Date  . Hypertension   . Diabetes mellitus   . Hyperlipidemia   . Cataracts, bilateral   . Hyperkalemia   . UTI (lower urinary tract infection)   . Calculus of kidney   . Sensorineural hearing loss, unspecified   . Epiglottitis   . Chronic ankle pain   . Neuromuscular disorder     neurophaty lower legs  . Upper GI bleeding     while at [facility] 12/2014  . Prostate cancer 2009    radiation  . Confusion   . Gastroparesis due to DM: DELAYED GASTRIC EMPTYING STUDY 02/08/15 02/08/2015   Past Surgical History  Procedure Laterality Date  . Eye surgery  sept. 2015    cataract surgery  . Hernia repair      severa; years can't remeber  . Femur im nail Right 01/18/2015    Procedure: INTRAMEDULLARY (IM) RETROGRADE FEMORAL NAILING;  Surgeon: Renette Butters, MD;  Location: [place];  Service: Orthopedics;  Laterality: Right;    No family history on file. History  Substance Use Topics  . Smoking status: Never Smoker   . Smokeless tobacco: Not on file  . Alcohol Use: No    Review of Systems  Constitutional: Negative for fever.  Respiratory: Negative for shortness of breath.   Cardiovascular: Negative for chest pain.  Gastrointestinal: Negative for vomiting, abdominal pain and diarrhea.  Genitourinary: Negative for dysuria.  All other systems reviewed and are negative.   Allergies  Review of patient's allergies indicates no known allergies.  Home Medications   Prior to Admission medications   Medication Sig Start Date End Date Taking? Authorizing Provider  acetaminophen (TYLENOL) 500 MG tablet Take 500 mg by mouth every 6 (six) hours as needed for mild pain or moderate pain.   Yes Historical Provider, MD  atorvastatin (LIPITOR) 40 MG tablet Take 40 mg by mouth daily.   Yes Historical Provider, MD  gabapentin (NEURONTIN) 300 MG capsule Take 1 capsule (300 mg total) by mouth at bedtime. 03/09/15  Yes Kathie Dike, MD  insulin aspart (NOVOLOG) 100 UNIT/ML injection insulin aspart (novoLOG) injection 0-15 Units 0-15 Units, Subcutaneous, 3 times daily with meals CBG 70 - 120: 0 units CBG 121 - 150: 2 units CBG 151 - 200: 3 units CBG 201 - 250: 5 units CBG 251 - 300: 8 units CBG 301 - 350: 11  units CBG 351 - 400: 15 units CBG > 400: call MD Patient taking differently: Inject 0-15 Units into the skin 3 (three) times daily with meals. insulin aspart (novoLOG) injection 0-15 Units 0-15 Units, Subcutaneous, 3 times daily with meals CBG 70 - 120: 0 units CBG 121 - 150: 2 units CBG 151 - 200: 3 units CBG 201 - 250: 5 units CBG 251 - 300: 8 units CBG 301 - 350: 11 units CBG 351 - 400: 15 units CBG > 400: call MD 01/21/15  Yes Shanker Kristeen Mans, MD  insulin detemir (LEVEMIR) 100 UNIT/ML injection Inject 0.1 mLs (10 Units total) into the skin at bedtime. 02/09/15  Yes Eugenie Filler, MD  ketorolac  (ACULAR) 0.5 % ophthalmic solution Place 1 drop into both eyes 3 (three) times daily.   Yes Historical Provider, MD  metoCLOPramide (REGLAN) 5 MG tablet Take 1 tablet (5 mg total) by mouth 4 (four) times daily -  before meals and at bedtime. 02/09/15  Yes Eugenie Filler, MD  metoprolol tartrate (LOPRESSOR) 25 MG tablet Take 1 tablet (25 mg total) by mouth 2 (two) times daily. 03/09/15  Yes Kathie Dike, MD  metroNIDAZOLE (FLAGYL) 500 MG tablet Take 1 tablet (500 mg total) by mouth 3 (three) times daily. 03/09/15  Yes Kathie Dike, MD  OVER THE COUNTER MEDICATION Place 2 suppositories rectally daily as needed (fever). Tylenol suppository   Yes Historical Provider, MD  oxybutynin (DITROPAN) 5 MG tablet Take 1 tablet (5 mg total) by mouth every 8 (eight) hours as needed for bladder spasms. 02/09/15  Yes Eugenie Filler, MD  saccharomyces boulardii (FLORASTOR) 250 MG capsule Take 1 capsule (250 mg total) by mouth 2 (two) times daily. 03/09/15  Yes Kathie Dike, MD  tamsulosin (FLOMAX) 0.4 MG CAPS capsule Take 1 capsule (0.4 mg total) by mouth daily. 02/09/15  Yes Eugenie Filler, MD  torsemide (DEMADEX) 20 MG tablet Take 1 tablet (20 mg total) by mouth daily. 03/09/15  Yes Kathie Dike, MD  iron polysaccharides (NIFEREX) 150 MG capsule Take 1 capsule (150 mg total) by mouth daily. Patient not taking: Reported on 03/15/2015 03/09/15   Kathie Dike, MD  polyethylene glycol (MIRALAX / GLYCOLAX) packet Take 17 g by mouth 2 (two) times daily as needed for moderate constipation. Hold if develops diarrhea. Patient not taking: Reported on 03/15/2015 03/09/15   Kathie Dike, MD  traMADol (ULTRAM) 50 MG tablet Take 2 tablets (100 mg total) by mouth every 6 (six) hours as needed for severe pain. Patient not taking: Reported on 03/15/2015 02/09/15   Eugenie Filler, MD   BP 108/63 mmHg  Pulse 93  Temp(Src) 99.3 F (37.4 C) (Oral)  Resp 18  SpO2 99% Physical Exam  Constitutional: No distress.  Frail,  elderly  HENT:  Head: Normocephalic and atraumatic.  Right Ear: External ear normal.  Left Ear: External ear normal.  Dry mm  Eyes: Conjunctivae are normal. Right eye exhibits no discharge. Left eye exhibits no discharge. No scleral icterus.  Neck: No tracheal deviation present.  Cardiovascular: Normal rate, regular rhythm and intact distal pulses.   Pulmonary/Chest: Effort normal and breath sounds normal. No stridor. No respiratory distress. He has no wheezes. He has no rales.  Abdominal: Soft. Bowel sounds are normal. He exhibits no distension. There is no tenderness. There is no rebound and no guarding.  Musculoskeletal: He exhibits no edema or tenderness.  Neurological: He is alert. He displays tremor. No cranial nerve deficit or sensory deficit.  He exhibits normal muscle tone. He displays no seizure activity. Coordination normal.  Generalized weakness, but equal grip strength and plantar flexion bilaterally  Skin: Skin is warm and dry. No rash noted.  Psychiatric: He has a normal mood and affect.  Nursing note and vitals reviewed.   ED Course  Procedures   DIAGNOSTIC STUDIES: Oxygen Saturation is 99% on RA, normal by my interpretation.    COORDINATION OF CARE: 8:00 PM Discussed treatment plan with pt at bedside and pt agreed to plan.   Labs Review Labs Reviewed  CBC WITH DIFFERENTIAL/PLATELET - Abnormal; Notable for the following:    RBC 3.50 (*)    Hemoglobin 9.8 (*)    HCT 31.5 (*)    All other components within normal limits  BASIC METABOLIC PANEL - Abnormal; Notable for the following:    Glucose, Bld 62 (*)    BUN 61 (*)    Creatinine, Ser 4.00 (*)    Calcium 8.2 (*)    GFR calc non Af Amer 13 (*)    GFR calc Af Amer 15 (*)    All other components within normal limits  URINALYSIS, ROUTINE W REFLEX MICROSCOPIC - Abnormal; Notable for the following:    Color, Urine RED (*)    APPearance TURBID (*)    Hgb urine dipstick LARGE (*)    Bilirubin Urine MODERATE (*)     Ketones, ur TRACE (*)    Protein, ur >300 (*)    Urobilinogen, UA 2.0 (*)    Nitrite POSITIVE (*)    Leukocytes, UA LARGE (*)    All other components within normal limits  URINE MICROSCOPIC-ADD ON - Abnormal; Notable for the following:    Bacteria, UA MANY (*)    All other components within normal limits  URINE CULTURE   Medications  sodium chloride 0.9 % bolus 500 mL (not administered)  cefTRIAXone (ROCEPHIN) 1 g in dextrose 5 % 50 mL IVPB (not administered)  0.9 %  sodium chloride infusion ( Intravenous Stopped 03/15/15 2238)    MDM   Final diagnoses:  UTI (lower urinary tract infection)  Renal insufficiency   Pt has persistent UTI.  Will start IV abs.  Renal function is also worsening.  IV fluids given.  Pt appears dehydrated.  Will admit for further treatment.  I personally performed the services described in this documentation, which was scribed in my presence.  The recorded information has been reviewed and is accurate.    Douglas Rank, MD 03/15/15 2351

## 2015-03-16 ENCOUNTER — Encounter (HOSPITAL_COMMUNITY): Payer: Self-pay | Admitting: *Deleted

## 2015-03-16 DIAGNOSIS — D638 Anemia in other chronic diseases classified elsewhere: Secondary | ICD-10-CM

## 2015-03-16 DIAGNOSIS — H905 Unspecified sensorineural hearing loss: Secondary | ICD-10-CM | POA: Diagnosis present

## 2015-03-16 DIAGNOSIS — N184 Chronic kidney disease, stage 4 (severe): Secondary | ICD-10-CM | POA: Diagnosis present

## 2015-03-16 DIAGNOSIS — Y846 Urinary catheterization as the cause of abnormal reaction of the patient, or of later complication, without mention of misadventure at the time of the procedure: Secondary | ICD-10-CM | POA: Diagnosis present

## 2015-03-16 DIAGNOSIS — E43 Unspecified severe protein-calorie malnutrition: Secondary | ICD-10-CM | POA: Insufficient documentation

## 2015-03-16 DIAGNOSIS — E785 Hyperlipidemia, unspecified: Secondary | ICD-10-CM | POA: Diagnosis present

## 2015-03-16 DIAGNOSIS — E860 Dehydration: Secondary | ICD-10-CM | POA: Diagnosis present

## 2015-03-16 DIAGNOSIS — Z794 Long term (current) use of insulin: Secondary | ICD-10-CM | POA: Diagnosis not present

## 2015-03-16 DIAGNOSIS — K3184 Gastroparesis: Secondary | ICD-10-CM | POA: Diagnosis present

## 2015-03-16 DIAGNOSIS — Z8546 Personal history of malignant neoplasm of prostate: Secondary | ICD-10-CM | POA: Diagnosis not present

## 2015-03-16 DIAGNOSIS — N179 Acute kidney failure, unspecified: Secondary | ICD-10-CM

## 2015-03-16 DIAGNOSIS — A0472 Enterocolitis due to Clostridium difficile, not specified as recurrent: Secondary | ICD-10-CM | POA: Diagnosis present

## 2015-03-16 DIAGNOSIS — I129 Hypertensive chronic kidney disease with stage 1 through stage 4 chronic kidney disease, or unspecified chronic kidney disease: Secondary | ICD-10-CM | POA: Diagnosis present

## 2015-03-16 DIAGNOSIS — A047 Enterocolitis due to Clostridium difficile: Secondary | ICD-10-CM | POA: Diagnosis not present

## 2015-03-16 DIAGNOSIS — Z87440 Personal history of urinary (tract) infections: Secondary | ICD-10-CM | POA: Diagnosis not present

## 2015-03-16 DIAGNOSIS — E1143 Type 2 diabetes mellitus with diabetic autonomic (poly)neuropathy: Secondary | ICD-10-CM | POA: Diagnosis present

## 2015-03-16 DIAGNOSIS — T8351XA Infection and inflammatory reaction due to indwelling urinary catheter, initial encounter: Secondary | ICD-10-CM | POA: Diagnosis present

## 2015-03-16 DIAGNOSIS — N320 Bladder-neck obstruction: Secondary | ICD-10-CM | POA: Diagnosis present

## 2015-03-16 DIAGNOSIS — N390 Urinary tract infection, site not specified: Secondary | ICD-10-CM | POA: Diagnosis not present

## 2015-03-16 DIAGNOSIS — A419 Sepsis, unspecified organism: Secondary | ICD-10-CM | POA: Diagnosis not present

## 2015-03-16 LAB — CBC
HCT: 27 % — ABNORMAL LOW (ref 39.0–52.0)
HEMOGLOBIN: 8.5 g/dL — AB (ref 13.0–17.0)
MCH: 28.3 pg (ref 26.0–34.0)
MCHC: 31.5 g/dL (ref 30.0–36.0)
MCV: 90 fL (ref 78.0–100.0)
Platelets: 198 10*3/uL (ref 150–400)
RBC: 3 MIL/uL — ABNORMAL LOW (ref 4.22–5.81)
RDW: 14.2 % (ref 11.5–15.5)
WBC: 6.1 10*3/uL (ref 4.0–10.5)

## 2015-03-16 LAB — BASIC METABOLIC PANEL
Anion gap: 10 (ref 5–15)
BUN: 59 mg/dL — ABNORMAL HIGH (ref 6–23)
CHLORIDE: 103 mmol/L (ref 96–112)
CO2: 24 mmol/L (ref 19–32)
CREATININE: 3.52 mg/dL — AB (ref 0.50–1.35)
Calcium: 7.7 mg/dL — ABNORMAL LOW (ref 8.4–10.5)
GFR calc Af Amer: 17 mL/min — ABNORMAL LOW (ref >90)
GFR calc non Af Amer: 15 mL/min — ABNORMAL LOW (ref >90)
Glucose, Bld: 121 mg/dL — ABNORMAL HIGH (ref 70–99)
Potassium: 4.5 mmol/L (ref 3.5–5.1)
Sodium: 137 mmol/L (ref 135–145)

## 2015-03-16 LAB — GLUCOSE, CAPILLARY
GLUCOSE-CAPILLARY: 122 mg/dL — AB (ref 70–99)
GLUCOSE-CAPILLARY: 160 mg/dL — AB (ref 70–99)
Glucose-Capillary: 124 mg/dL — ABNORMAL HIGH (ref 70–99)
Glucose-Capillary: 117 mg/dL — ABNORMAL HIGH (ref 70–99)
Glucose-Capillary: 142 mg/dL — ABNORMAL HIGH (ref 70–99)

## 2015-03-16 MED ORDER — SODIUM CHLORIDE 0.9 % IJ SOLN
3.0000 mL | Freq: Two times a day (BID) | INTRAMUSCULAR | Status: DC
  Administered 2015-03-16 – 2015-03-18 (×3): 3 mL via INTRAVENOUS

## 2015-03-16 MED ORDER — TAMSULOSIN HCL 0.4 MG PO CAPS
0.4000 mg | ORAL_CAPSULE | Freq: Every day | ORAL | Status: DC
  Administered 2015-03-16 – 2015-03-20 (×5): 0.4 mg via ORAL
  Filled 2015-03-16 (×5): qty 1

## 2015-03-16 MED ORDER — HEPARIN SODIUM (PORCINE) 5000 UNIT/ML IJ SOLN
5000.0000 [IU] | Freq: Three times a day (TID) | INTRAMUSCULAR | Status: DC
  Administered 2015-03-16 – 2015-03-18 (×7): 5000 [IU] via SUBCUTANEOUS
  Filled 2015-03-16 (×7): qty 1

## 2015-03-16 MED ORDER — CETYLPYRIDINIUM CHLORIDE 0.05 % MT LIQD
7.0000 mL | Freq: Two times a day (BID) | OROMUCOSAL | Status: DC
  Administered 2015-03-16 – 2015-03-20 (×9): 7 mL via OROMUCOSAL

## 2015-03-16 MED ORDER — DEXTROSE 5 % IV SOLN
1.0000 g | INTRAVENOUS | Status: DC
  Administered 2015-03-16 – 2015-03-19 (×4): 1 g via INTRAVENOUS
  Filled 2015-03-16 (×5): qty 10

## 2015-03-16 MED ORDER — CEFTRIAXONE SODIUM IN DEXTROSE 20 MG/ML IV SOLN
1.0000 g | Freq: Every day | INTRAVENOUS | Status: DC
  Filled 2015-03-16 (×3): qty 50

## 2015-03-16 MED ORDER — GABAPENTIN 300 MG PO CAPS
300.0000 mg | ORAL_CAPSULE | Freq: Every day | ORAL | Status: DC
Start: 2015-03-16 — End: 2015-03-20
  Administered 2015-03-16 – 2015-03-19 (×4): 300 mg via ORAL
  Filled 2015-03-16 (×5): qty 1

## 2015-03-16 MED ORDER — VANCOMYCIN 50 MG/ML ORAL SOLUTION
125.0000 mg | Freq: Four times a day (QID) | ORAL | Status: DC
  Administered 2015-03-16 – 2015-03-20 (×17): 125 mg via ORAL
  Filled 2015-03-16 (×19): qty 2.5

## 2015-03-16 MED ORDER — INSULIN ASPART 100 UNIT/ML ~~LOC~~ SOLN
0.0000 [IU] | Freq: Every day | SUBCUTANEOUS | Status: DC
  Administered 2015-03-17: 2 [IU] via SUBCUTANEOUS
  Administered 2015-03-18: 4 [IU] via SUBCUTANEOUS

## 2015-03-16 MED ORDER — VANCOMYCIN 50 MG/ML ORAL SOLUTION
ORAL | Status: AC
  Filled 2015-03-16: qty 5

## 2015-03-16 MED ORDER — KETOROLAC TROMETHAMINE 0.5 % OP SOLN
1.0000 [drp] | Freq: Three times a day (TID) | OPHTHALMIC | Status: DC
Start: 2015-03-16 — End: 2015-03-20
  Administered 2015-03-16 – 2015-03-20 (×13): 1 [drp] via OPHTHALMIC
  Filled 2015-03-16: qty 3

## 2015-03-16 MED ORDER — METOPROLOL TARTRATE 25 MG PO TABS
25.0000 mg | ORAL_TABLET | Freq: Two times a day (BID) | ORAL | Status: DC
Start: 2015-03-16 — End: 2015-03-20
  Administered 2015-03-16 – 2015-03-20 (×9): 25 mg via ORAL
  Filled 2015-03-16 (×10): qty 1

## 2015-03-16 MED ORDER — METOCLOPRAMIDE HCL 10 MG PO TABS
5.0000 mg | ORAL_TABLET | Freq: Three times a day (TID) | ORAL | Status: DC
  Administered 2015-03-16 – 2015-03-20 (×18): 5 mg via ORAL
  Filled 2015-03-16 (×18): qty 1

## 2015-03-16 MED ORDER — ATORVASTATIN CALCIUM 40 MG PO TABS
40.0000 mg | ORAL_TABLET | Freq: Every day | ORAL | Status: DC
Start: 2015-03-16 — End: 2015-03-20
  Administered 2015-03-16 – 2015-03-19 (×4): 40 mg via ORAL
  Filled 2015-03-16 (×4): qty 1

## 2015-03-16 MED ORDER — POLYETHYLENE GLYCOL 3350 17 G PO PACK: 17.0000 g | PACK | Freq: Two times a day (BID) | ORAL | Status: DC | PRN

## 2015-03-16 MED ORDER — SACCHAROMYCES BOULARDII 250 MG PO CAPS
250.0000 mg | ORAL_CAPSULE | Freq: Two times a day (BID) | ORAL | Status: DC
  Administered 2015-03-16 – 2015-03-20 (×9): 250 mg via ORAL
  Filled 2015-03-16 (×10): qty 1

## 2015-03-16 MED ORDER — SODIUM CHLORIDE 0.9 % IV SOLN
INTRAVENOUS | Status: DC
  Administered 2015-03-16 – 2015-03-19 (×6): via INTRAVENOUS

## 2015-03-16 MED ORDER — ACETAMINOPHEN 500 MG PO TABS
500.0000 mg | ORAL_TABLET | Freq: Four times a day (QID) | ORAL | Status: DC | PRN
  Administered 2015-03-17 – 2015-03-19 (×6): 500 mg via ORAL
  Filled 2015-03-16 (×6): qty 1

## 2015-03-16 MED ORDER — KCL IN DEXTROSE-NACL 20-5-0.9 MEQ/L-%-% IV SOLN
INTRAVENOUS | Status: DC
  Administered 2015-03-16: 02:00:00 via INTRAVENOUS

## 2015-03-16 MED ORDER — POLYSACCHARIDE IRON COMPLEX 150 MG PO CAPS
150.0000 mg | ORAL_CAPSULE | Freq: Every day | ORAL | Status: DC
  Administered 2015-03-16 – 2015-03-20 (×4): 150 mg via ORAL
  Filled 2015-03-16 (×5): qty 1

## 2015-03-16 MED ORDER — CEFTRIAXONE SODIUM IN DEXTROSE 20 MG/ML IV SOLN
1.0000 g | Freq: Every day | INTRAVENOUS | Status: DC
  Filled 2015-03-16 (×2): qty 50

## 2015-03-16 MED ORDER — INSULIN ASPART 100 UNIT/ML ~~LOC~~ SOLN
0.0000 [IU] | SUBCUTANEOUS | Status: DC
  Administered 2015-03-16 (×2): 1 [IU] via SUBCUTANEOUS

## 2015-03-16 MED ORDER — INSULIN ASPART 100 UNIT/ML ~~LOC~~ SOLN
0.0000 [IU] | Freq: Three times a day (TID) | SUBCUTANEOUS | Status: DC
  Administered 2015-03-17 (×2): 1 [IU] via SUBCUTANEOUS
  Administered 2015-03-17: 2 [IU] via SUBCUTANEOUS
  Administered 2015-03-18: 3 [IU] via SUBCUTANEOUS
  Administered 2015-03-18: 2 [IU] via SUBCUTANEOUS
  Administered 2015-03-19 (×2): 1 [IU] via SUBCUTANEOUS

## 2015-03-16 NOTE — Progress Notes (Signed)
[name] PROGRESS NOTE  Douglas [name] EVO:[id number] DOB: 1931/02/08 DOA: 03/15/2015 PCP: [name]., MD  Assessment/Plan: 1. Sepsis with UTI 1. Pt is continued on rocephin 2. Afebrile 3. No leukocytosis 4. Afebrile 2. Dehydration 1. Multiple gentle IVF boluses were given overnight 2. Will continue on NS at 75cc/hr 3. Bladder outlet obstruction 1. Pt with indwelling foley cath 2. Blood clots successfully flushed out from cath overnight 4. Hematuria 1. Hgb 8.5 from 9.8 2. Monitor closely 3. Continue foley cath. Clots noted in urine 4. Urology was consulted overnight 5. ARF 1. Improving with IVF 2. Will cont hydration and monitor renal function closely 6. Hx Cdiff colitis 1. On Vancomycin with florastor 2. Per family, diarrhea seems improved 7. DVT prophylaxis 1. Heparin subQ  Code Status: Full Family Communication: Pt in room, family at bedside (indicate person spoken with, relationship, and if by phone, the number) Disposition Plan: Pending   Consultants:  Urology  Procedures:    Antibiotics:  Rocephin 3/19>>>  Vancomycin 3/19>>>  HPI/Subjective: Feels better today. No acute events noted  Objective: Filed Vitals:   03/16/15 0213 03/16/15 0644 03/16/15 1128 03/16/15 1212  BP:  100/44 119/57 139/59  Pulse:  81 78 77  Temp:  98.9 F (37.2 C)  98.7 F (37.1 C)  TempSrc:  Oral  Oral  Resp:  18 18   Height: 5\' 3"  (1.6 m)     Weight: 58.514 kg (129 lb)     SpO2:  96% 98% 100%    Intake/Output Summary (Last 24 hours) at 03/16/15 1320 Last data filed at 03/16/15 1221  Gross per 24 hour  Intake 299.25 ml  Output    900 ml  Net -600.75 ml   Filed Weights   03/16/15 0213  Weight: 58.514 kg (129 lb)    Exam:   General:  Awake, in nad  Cardiovascular: regular, s1, s2  Respiratory: normal resp effort, no wheezing  Abdomen: soft,nondistended  Musculoskeletal: perfused, no clubbing   Data Reviewed: Basic Metabolic  Panel:  Recent Labs Lab 03/15/15 2124 03/16/15 0644  NA 136 137  K 4.8 4.5  CL 98 103  CO2 25 24  GLUCOSE 62* 121*  BUN 61* 59*  CREATININE 4.00* 3.52*  CALCIUM 8.2* 7.7*   Liver Function Tests: No results for input(s): AST, ALT, ALKPHOS, BILITOT, PROT, ALBUMIN in the last 168 hours. No results for input(s): LIPASE, AMYLASE in the last 168 hours. No results for input(s): AMMONIA in the last 168 hours. CBC:  Recent Labs Lab 03/15/15 2124 03/16/15 0644  WBC 8.8 6.1  NEUTROABS 6.3  --   HGB 9.8* 8.5*  HCT 31.5* 27.0*  MCV 90.0 90.0  PLT 233 198   Cardiac Enzymes: No results for input(s): CKTOTAL, CKMB, CKMBINDEX, TROPONINI in the last 168 hours. BNP (last 3 results)  Recent Labs  02/26/15 1726  BNP 187.0*    ProBNP (last 3 results) No results for input(s): PROBNP in the last 8760 hours.  CBG:  Recent Labs Lab 03/16/15 0409 03/16/15 0755 03/16/15 1202  GLUCAP 122* 124* 117*    Recent Results (from the past 240 hour(s))  MRSA PCR Screening     Status: None   Collection Time: 03/07/15 11:40 PM  Result Value Ref Range Status   MRSA by PCR NEGATIVE NEGATIVE Final    Comment:        The GeneXpert MRSA Assay (FDA approved for NASAL specimens only), is one component of a comprehensive MRSA colonization surveillance program. It  is not intended to diagnose MRSA infection nor to guide or monitor treatment for MRSA infections.      Studies: No results found.  Scheduled Meds: . antiseptic oral rinse  7 mL Mouth Rinse BID  . atorvastatin  40 mg Oral q1800  . cefTRIAXone (ROCEPHIN)  IV  1 g Intravenous Q24H  . gabapentin  300 mg Oral QHS  . heparin  5,000 Units Subcutaneous 3 times per day  . insulin aspart  0-9 Units Subcutaneous 6 times per day  . iron polysaccharides  150 mg Oral Daily  . ketorolac  1 drop Both Eyes TID  . metoCLOPramide  5 mg Oral TID AC & HS  . metoprolol tartrate  25 mg Oral BID  . saccharomyces boulardii  250 mg Oral BID  .  sodium chloride  3 mL Intravenous Q12H  . tamsulosin  0.4 mg Oral Daily  . vancomycin  125 mg Oral 4 times per day   Continuous Infusions: . dextrose 5 % and 0.9 % NaCl with KCl 20 mEq/L 75 mL/hr at 03/16/15 0206    Principal Problem:   Sepsis secondary to UTI Active Problems:   Acute renal failure   Dehydration   Diabetes   CKD (chronic kidney disease) stage 4, GFR 15-29 ml/min   Anemia, chronic disease   Fracture, femur, distal   UTI (lower urinary tract infection)   C. difficile colitis   Sepsis due to urinary tract infection   Douglas [name], [facility] Pager [phone]. If 7PM-7AM, please contact night-coverage at [email or web address], password [email or web address] 03/16/2015, 1:20 PM  LOS: 0 days

## 2015-03-16 NOTE — H&P (Signed)
[name] History and Physical  Douglas [name] QIO:[id number] DOB: [birth year]    PCP:   [name]., MD   Chief Complaint: foul smelling and dark urine.   HPI: Douglas [name] is an 81 y.o. male recently admitted for UTI, having hx of urinary retention, on chronic foley for over a month, hx of HTN, DM, hperlipidemia, hx of prostate cancer, upper GI bleed, neuropathy, sent to the ER as it was noted that his urine looks dark, and it has a foul smell.  He was recently diagnosed with C diff colitis as well, and had just finished his Flagyl, though still having diarrhea yesterday three times.  Evalatuion in the ER showed UA postitive for persistent infection, and his Lab showed AKI on CKD with Cr now at 4, where as 10 days ago, it was 2.9.  He has no leukocytosis, and his foley was changed during his last admission.  There were prior discussion about having a suprapubic catheter placement for him.    Rewiew of Systems:  Constitutional: Negative for malaise, fever and chills. No significant weight loss or weight gain Eyes: Negative for eye pain, redness and discharge, diplopia, visual changes, or flashes of light. ENMT: Negative for ear pain, hoarseness, nasal congestion, sinus pressure and sore throat. No headaches; tinnitus, drooling, or problem swallowing. Cardiovascular: Negative for chest pain, palpitations, diaphoresis, dyspnea and peripheral edema. ; No orthopnea, PND Respiratory: Negative for cough, hemoptysis, wheezing and stridor. No pleuritic chestpain. Gastrointestinal: Negative for nausea, vomiting,constipation, abdominal pain, melena, blood in stool, hematemesis, jaundice and rectal bleeding.    Genitourinary: Negative for frequency, dysuria, incontinence,flank pain  Musculoskeletal: Negative for back pain and neck pain. Negative for swelling and trauma.;  Skin: . Negative for pruritus, rash, abrasions, bruising and skin lesion.; ulcerations Neuro: Negative for headache,  lightheadedness and neck stiffness. Negative for weakness, altered level of consciousness , altered mental status, extremity weakness, burning feet, involuntary movement, seizure and syncope.  Psych: negative for anxiety, depression, insomnia, tearfulness, panic attacks, hallucinations, paranoia, suicidal or homicidal ideation   Past Medical History  Diagnosis Date  . Hypertension   . Diabetes mellitus   . Hyperlipidemia   . Cataracts, bilateral   . Hyperkalemia   . UTI (lower urinary tract infection)   . Calculus of kidney   . Sensorineural hearing loss, unspecified   . Epiglottitis   . Chronic ankle pain   . Neuromuscular disorder     neurophaty lower legs  . Upper GI bleeding     while at [facility] 12/2014  . Prostate cancer 2009    radiation  . Confusion   . Gastroparesis due to DM: DELAYED GASTRIC EMPTYING STUDY 02/08/15 02/08/2015    Past Surgical History  Procedure Laterality Date  . Eye surgery  sept. 2015    cataract surgery  . Hernia repair      severa; years can't remeber  . Femur im nail Right 01/18/2015    Procedure: INTRAMEDULLARY (IM) RETROGRADE FEMORAL NAILING;  Surgeon: Renette Butters, MD;  Location: [place];  Service: Orthopedics;  Laterality: Right;    Medications:  HOME MEDS: Prior to Admission medications   Medication Sig Start Date End Date Taking? Authorizing Provider  acetaminophen (TYLENOL) 500 MG tablet Take 500 mg by mouth every 6 (six) hours as needed for mild pain or moderate pain.   Yes Historical Provider, MD  atorvastatin (LIPITOR) 40 MG tablet Take 40 mg by mouth daily.   Yes Historical Provider, MD  gabapentin (NEURONTIN)  300 MG capsule Take 1 capsule (300 mg total) by mouth at bedtime. 03/09/15  Yes Kathie Dike, MD  insulin aspart (NOVOLOG) 100 UNIT/ML injection insulin aspart (novoLOG) injection 0-15 Units 0-15 Units, Subcutaneous, 3 times daily with meals CBG 70 - 120: 0 units CBG 121 - 150: 2 units CBG 151 - 200: 3 units CBG 201 - 250: 5  units CBG 251 - 300: 8 units CBG 301 - 350: 11 units CBG 351 - 400: 15 units CBG > 400: call MD Patient taking differently: Inject 0-15 Units into the skin 3 (three) times daily with meals. insulin aspart (novoLOG) injection 0-15 Units 0-15 Units, Subcutaneous, 3 times daily with meals CBG 70 - 120: 0 units CBG 121 - 150: 2 units CBG 151 - 200: 3 units CBG 201 - 250: 5 units CBG 251 - 300: 8 units CBG 301 - 350: 11 units CBG 351 - 400: 15 units CBG > 400: call MD 01/21/15  Yes Shanker Kristeen Mans, MD  insulin detemir (LEVEMIR) 100 UNIT/ML injection Inject 0.1 mLs (10 Units total) into the skin at bedtime. 02/09/15  Yes Eugenie Filler, MD  ketorolac (ACULAR) 0.5 % ophthalmic solution Place 1 drop into both eyes 3 (three) times daily.   Yes Historical Provider, MD  metoCLOPramide (REGLAN) 5 MG tablet Take 1 tablet (5 mg total) by mouth 4 (four) times daily -  before meals and at bedtime. 02/09/15  Yes Eugenie Filler, MD  metoprolol tartrate (LOPRESSOR) 25 MG tablet Take 1 tablet (25 mg total) by mouth 2 (two) times daily. 03/09/15  Yes Kathie Dike, MD  metroNIDAZOLE (FLAGYL) 500 MG tablet Take 1 tablet (500 mg total) by mouth 3 (three) times daily. 03/09/15  Yes Kathie Dike, MD  OVER THE COUNTER MEDICATION Place 2 suppositories rectally daily as needed (fever). Tylenol suppository   Yes Historical Provider, MD  oxybutynin (DITROPAN) 5 MG tablet Take 1 tablet (5 mg total) by mouth every 8 (eight) hours as needed for bladder spasms. 02/09/15  Yes Eugenie Filler, MD  saccharomyces boulardii (FLORASTOR) 250 MG capsule Take 1 capsule (250 mg total) by mouth 2 (two) times daily. 03/09/15  Yes Kathie Dike, MD  tamsulosin (FLOMAX) 0.4 MG CAPS capsule Take 1 capsule (0.4 mg total) by mouth daily. 02/09/15  Yes Eugenie Filler, MD  torsemide (DEMADEX) 20 MG tablet Take 1 tablet (20 mg total) by mouth daily. 03/09/15  Yes Kathie Dike, MD  iron polysaccharides (NIFEREX) 150 MG capsule Take 1  capsule (150 mg total) by mouth daily. Patient not taking: Reported on 03/15/2015 03/09/15   Kathie Dike, MD  polyethylene glycol (MIRALAX / GLYCOLAX) packet Take 17 g by mouth 2 (two) times daily as needed for moderate constipation. Hold if develops diarrhea. Patient not taking: Reported on 03/15/2015 03/09/15   Kathie Dike, MD  traMADol (ULTRAM) 50 MG tablet Take 2 tablets (100 mg total) by mouth every 6 (six) hours as needed for severe pain. Patient not taking: Reported on 03/15/2015 02/09/15   Eugenie Filler, MD     Allergies:  No Known Allergies  Social History:   reports that he has never smoked. He does not have any smokeless tobacco history on file. He reports that he does not drink alcohol or use illicit drugs.  Family History: No family history on file.   Physical Exam: Filed Vitals:   03/15/15 2007  BP: 108/63  Pulse: 93  Temp: 99.3 F (37.4 C)  TempSrc: Oral  Resp: 18  SpO2: 99%   Blood pressure 108/63, pulse 93, temperature 99.3 F (37.4 C), temperature source Oral, resp. rate 18, SpO2 99 %.  GEN:  Pleasant  patient lying in the stretcher in no acute distress; cooperative with exam. PSYCH:  alert and oriented x4; does not appear anxious or depressed; affect is appropriate. HEENT: Mucous membranes pink and anicteric; PERRLA; EOM intact; no cervical lymphadenopathy nor thyromegaly or carotid bruit; no JVD; There were no stridor. Neck is very supple. Breasts:: Not examined CHEST WALL: No tenderness CHEST: Normal respiration, clear to auscultation bilaterally.  HEART: Regular rate and rhythm.  There are no murmur, rub, or gallops.   BACK: No kyphosis or scoliosis; no CVA tenderness ABDOMEN: soft and non-tender; no masses, no organomegaly, normal abdominal bowel sounds; no pannus; no intertriginous candida. There is no rebound and no distention. Rectal Exam: Not done EXTREMITIES: No bone or joint deformity; age-appropriate arthropathy of the hands and knees; no  edema; no ulcerations.  There is no calf tenderness. Genitalia: not examined PULSES: 2+ and symmetric SKIN: Normal hydration no rash or ulceration CNS: Cranial nerves 2-12 grossly intact no focal lateralizing neurologic deficit.  Speech is fluent; uvula elevated with phonation, facial symmetry and tongue midline. DTR are normal bilaterally, cerebella exam is intact, barbinski is negative and strengths are equaled bilaterally.  No sensory loss.   Labs on Admission:  Basic Metabolic Panel:  Recent Labs Lab 03/09/15 0637 03/15/15 2124  NA 140 136  K 4.0 4.8  CL 104 98  CO2 27 25  GLUCOSE 106* 62*  BUN 42* 61*  CREATININE 2.94* 4.00*  CALCIUM 7.5* 8.2*   Liver Function Tests: No results for input(s): AST, ALT, ALKPHOS, BILITOT, PROT, ALBUMIN in the last 168 hours. No results for input(s): LIPASE, AMYLASE in the last 168 hours. No results for input(s): AMMONIA in the last 168 hours. CBC:  Recent Labs Lab 03/15/15 2124  WBC 8.8  NEUTROABS 6.3  HGB 9.8*  HCT 31.5*  MCV 90.0  PLT 233   Cardiac Enzymes: No results for input(s): CKTOTAL, CKMB, CKMBINDEX, TROPONINI in the last 168 hours.  CBG:  Recent Labs Lab 03/09/15 0732 03/09/15 1147  GLUCAP 107* 131*    Assessment/Plan Present on Admission:  . Sepsis secondary to UTI . UTI (lower urinary tract infection) . Fracture, femur, distal . Dehydration . Acute renal failure . Anemia, chronic disease . CKD (chronic kidney disease) stage 4, GFR 15-29 ml/min . C. difficile colitis  Will admit him for sepsis with UTI and start IV Rocephin.  I believe his AKI is pre-renal, and will give him IVF.  Follow Cr. He should have a suprapubic catheter, so I have placed a non urgent urology consult for him.  For his anemia, it has been stable.  His C diff infection is persistent, and will give him Vancomycin in addition for Florastor.  He is stable, full code, and will be admitted to [facility] service.  Thank you for allowing me to  participate in his care.    Other plans as per orders.  Code Status: FULL Haskel Khan, MD. [name] Pager [phone] 7pm to 7am.  03/16/2015, 12:42 AM

## 2015-03-16 NOTE — Progress Notes (Signed)
Foley irrigated and several small clots removed. Foley draining adequately at this time.

## 2015-03-16 NOTE — Progress Notes (Signed)
INITIAL NUTRITION ASSESSMENT Pt meets criteria for SEVERE MALNUTRITION in the context of ACUTE ILLNESS as evidenced by loss of 3.7% bw in 1 week and an estimated energy intake that met <50% of estimated needs for >5 days. DOCUMENTATION CODES Per approved criteria  -Severe malnutrition in the context of acute illness or injury   INTERVENTION: -Once diet advanced, Ensure Enlive po BID, each supplement provides 350 kcal and 20 grams of protein  -Recommend MVI    NUTRITION DIAGNOSIS: Increased calorie/protein needs related to tissue healing and LBM maintenance as evidenced by sepsis with unstageable PU.   Goal: Pt to meet >/= 90% of their estimated nutrition needs   Monitor:  Diet order, PU status, labs, weights, goals of care  Reason for Assessment: MST 2  81 y.o. male  Admitting Dx: Sepsis secondary to UTI  ASSESSMENT: 81 y.o. male recently admitted for UTI, having hx of urinary retention, on chronic foley for over a month, hx of HTN, DM, hperlipidemia, hx of prostate cancer, upper GI bleed, neuropathy.   Per 3/18 note: Pt with decreased appetite since hospitalization ending 3/12.   3/19 note Pt also with diarrhea    Height: Ht Readings from Last 1 Encounters:  03/16/15 _0  (1.6 m)    Weight: Wt Readings from Last 1 Encounters:  03/16/15 129 lb (58.514 kg)    Ideal Body Weight: 124 lbs  % Ideal Body Weight: 104 %  Wt Readings from Last 10 Encounters:  03/16/15 129 lb (58.514 kg)  03/09/15 134 lb 8 oz (61.009 kg)  02/02/15 137 lb 11.2 oz (62.46 kg)  01/17/15 144 lb (65.318 kg)  11/27/14 154 lb 5.1 oz (69.999 kg)  11/17/14 154 lb 5.2 oz (70 kg)  11/15/14 140 lb (63.504 kg)  10/15/14 140 lb (63.504 kg)  10/13/14 140 lb (63.504 kg)  04/19/14 145 lb (65.772 kg)  loss of 3.5% bw in 1 week   Usual Body Weight: 140-150  % Usual Body Weight: 92%  BMI:  Body mass index is 22.86 kg/(m^2).  Estimated Nutritional Needs: Kcal: 1650-1750 (28-30  kcal/kg) Protein: >99 g Pro (1.5-1.7 g/kg) Fluid: 1.7-1.8 liters + enough to replace what is lost in diarrhea  Skin: Unstageable PU heel  Diet Order: Diet NPO time specified Except for: Sips with Meds  EDUCATION NEEDS: -No education needs identified at this time   Intake/Output Summary (Last 24 hours) at 03/16/15 1339 Last data filed at 03/16/15 1221  Gross per 24 hour  Intake 299.25 ml  Output    900 ml  Net -600.75 ml    Last BM: 3/18   Labs:   Recent Labs Lab 03/15/15 2124 03/16/15 0644  NA 136 137  K 4.8 4.5  CL 98 103  CO2 25 24  BUN 61* 59*  CREATININE 4.00* 3.52*  CALCIUM 8.2* 7.7*  GLUCOSE 62* 121*    CBG (last 3)   Recent Labs  03/16/15 0409 03/16/15 0755 03/16/15 1202  GLUCAP 122* 124* 117*    Scheduled Meds: . antiseptic oral rinse  7 mL Mouth Rinse BID  . atorvastatin  40 mg Oral q1800  . cefTRIAXone (ROCEPHIN)  IV  1 g Intravenous Q24H  . gabapentin  300 mg Oral QHS  . heparin  5,000 Units Subcutaneous 3 times per day  . insulin aspart  0-9 Units Subcutaneous 6 times per day  . iron polysaccharides  150 mg Oral Daily  . ketorolac  1 drop Both Eyes TID  . metoCLOPramide  5 mg  Oral TID AC & HS  . metoprolol tartrate  25 mg Oral BID  . saccharomyces boulardii  250 mg Oral BID  . sodium chloride  3 mL Intravenous Q12H  . tamsulosin  0.4 mg Oral Daily  . vancomycin  125 mg Oral 4 times per day    Continuous Infusions: . sodium chloride    . dextrose 5 % and 0.9 % NaCl with KCl 20 mEq/L 75 mL/hr at 03/16/15 0206    Past Medical History  Diagnosis Date  . Hypertension   . Diabetes mellitus   . Hyperlipidemia   . Cataracts, bilateral   . Hyperkalemia   . UTI (lower urinary tract infection)   . Calculus of kidney   . Sensorineural hearing loss, unspecified   . Epiglottitis   . Chronic ankle pain   . Neuromuscular disorder     neurophaty lower legs  . Upper GI bleeding     while at [facility] 12/2014  . Prostate cancer 2009     radiation  . Confusion   . Gastroparesis due to DM: DELAYED GASTRIC EMPTYING STUDY 02/08/15 02/08/2015    Past Surgical History  Procedure Laterality Date  . Eye surgery  sept. 2015    cataract surgery  . Hernia repair      severa; years can't remeber  . Femur im nail Right 01/18/2015    Procedure: INTRAMEDULLARY (IM) RETROGRADE FEMORAL NAILING;  Surgeon: Renette Butters, MD;  Location: [facility];  Service: Orthopedics;  Laterality: Right;    Burtis Junes RD, LDN Nutrition Pager: [phone] 03/16/2015 1:39 PM

## 2015-03-17 DIAGNOSIS — E43 Unspecified severe protein-calorie malnutrition: Secondary | ICD-10-CM

## 2015-03-17 LAB — GLUCOSE, CAPILLARY
GLUCOSE-CAPILLARY: 214 mg/dL — AB (ref 70–99)
Glucose-Capillary: 142 mg/dL — ABNORMAL HIGH (ref 70–99)
Glucose-Capillary: 149 mg/dL — ABNORMAL HIGH (ref 70–99)
Glucose-Capillary: 158 mg/dL — ABNORMAL HIGH (ref 70–99)

## 2015-03-17 LAB — COMPREHENSIVE METABOLIC PANEL
ALK PHOS: 71 U/L (ref 39–117)
ALT: 9 U/L (ref 0–53)
ANION GAP: 8 (ref 5–15)
AST: 22 U/L (ref 0–37)
Albumin: 2.5 g/dL — ABNORMAL LOW (ref 3.5–5.2)
BILIRUBIN TOTAL: 0.4 mg/dL (ref 0.3–1.2)
BUN: 48 mg/dL — ABNORMAL HIGH (ref 6–23)
CALCIUM: 7.7 mg/dL — AB (ref 8.4–10.5)
CO2: 22 mmol/L (ref 19–32)
CREATININE: 2.87 mg/dL — AB (ref 0.50–1.35)
Chloride: 110 mmol/L (ref 96–112)
GFR calc non Af Amer: 19 mL/min — ABNORMAL LOW (ref >90)
GFR, EST AFRICAN AMERICAN: 22 mL/min — AB (ref >90)
Glucose, Bld: 103 mg/dL — ABNORMAL HIGH (ref 70–99)
Potassium: 4.8 mmol/L (ref 3.5–5.1)
Sodium: 140 mmol/L (ref 135–145)
Total Protein: 6.8 g/dL (ref 6.0–8.3)

## 2015-03-17 LAB — CBC
HCT: 25.1 % — ABNORMAL LOW (ref 39.0–52.0)
Hemoglobin: 7.8 g/dL — ABNORMAL LOW (ref 13.0–17.0)
MCH: 28.2 pg (ref 26.0–34.0)
MCHC: 31.1 g/dL (ref 30.0–36.0)
MCV: 90.6 fL (ref 78.0–100.0)
Platelets: 179 10*3/uL (ref 150–400)
RBC: 2.77 MIL/uL — AB (ref 4.22–5.81)
RDW: 14.1 % (ref 11.5–15.5)
WBC: 4.2 10*3/uL (ref 4.0–10.5)

## 2015-03-17 LAB — HEMOGLOBIN AND HEMATOCRIT, BLOOD
HEMATOCRIT: 27.1 % — AB (ref 39.0–52.0)
HEMOGLOBIN: 8.4 g/dL — AB (ref 13.0–17.0)

## 2015-03-17 MED ORDER — OXYBUTYNIN CHLORIDE 5 MG PO TABS
5.0000 mg | ORAL_TABLET | Freq: Once | ORAL | Status: AC
  Administered 2015-03-17: 5 mg via ORAL
  Filled 2015-03-17: qty 1

## 2015-03-17 NOTE — Progress Notes (Signed)
[name] PROGRESS NOTE  Douglas [name] DQQ:[id number] DOB: 1931/07/15 DOA: 03/15/2015 PCP: [name]., MD  Assessment/Plan: 1. Sepsis with UTI 1. Pt is continued on rocephin 2. Afebrile 3. No leukocytosis 4. Afebrile 5. Pending urine and blood cultures 2. Dehydration 1. Continued NS at 75cc/hr 3. Bladder outlet obstruction 1. Pt with indwelling foley cath 2. Blood clots successfully flushed out from cath overnight 4. Hematuria 1. Hgb 8.5 from 9.8 2. Monitor closely 3. Continue foley cath. Urine still appears dark red 4. Urology was consulted 5. ARF 1. Improving with IVF 2. Will cont hydration and monitor renal function closely 6. Hx Cdiff colitis 1. On Vancomycin with florastor 2. Per family, diarrhea seems improved 3. Will repeat cdiff 7. DVT prophylaxis 1. Heparin subQ  Code Status: Full Family Communication: Pt in room, family at bedside Disposition Plan: Pending   Consultants:  Urology  Procedures:    Antibiotics:  Rocephin 3/19>>>  Vancomycin 3/19>>>  HPI/Subjective: No acute events noted overnight. Pt is without complaints.  Objective: Filed Vitals:   03/16/15 2155 03/17/15 0636 03/17/15 1135 03/17/15 1506  BP: 132/47 131/42 141/60 152/48  Pulse: 79 64 67 65  Temp: 97.8 F (36.6 C) 98 F (36.7 C)  97.7 F (36.5 C)  TempSrc: Oral Oral  Axillary  Resp: 18 18  18   Height:      Weight:      SpO2: 100% 98%  100%    Intake/Output Summary (Last 24 hours) at 03/17/15 1640 Last data filed at 03/17/15 0830  Gross per 24 hour  Intake 1180.5 ml  Output    252 ml  Net  928.5 ml   Filed Weights   03/16/15 0213  Weight: 58.514 kg (129 lb)    Exam:   General:  Awake, in nad  Cardiovascular: regular, s1, s2  Respiratory: normal resp effort, no wheezing  Abdomen: soft,nondistended  Musculoskeletal: perfused, no clubbing   Data Reviewed: Basic Metabolic Panel:  Recent Labs Lab 03/15/15 2124 03/16/15 0644  03/17/15 0642  NA 136 137 140  K 4.8 4.5 4.8  CL 98 103 110  CO2 25 24 22   GLUCOSE 62* 121* 103*  BUN 61* 59* 48*  CREATININE 4.00* 3.52* 2.87*  CALCIUM 8.2* 7.7* 7.7*   Liver Function Tests:  Recent Labs Lab 03/17/15 0642  AST 22  ALT 9  ALKPHOS 71  BILITOT 0.4  PROT 6.8  ALBUMIN 2.5*   No results for input(s): LIPASE, AMYLASE in the last 168 hours. No results for input(s): AMMONIA in the last 168 hours. CBC:  Recent Labs Lab 03/15/15 2124 03/16/15 0644 03/17/15 0642 03/17/15 1557  WBC 8.8 6.1 4.2  --   NEUTROABS 6.3  --   --   --   HGB 9.8* 8.5* 7.8* 8.4*  HCT 31.5* 27.0* 25.1* 27.1*  MCV 90.0 90.0 90.6  --   PLT 233 198 179  --    Cardiac Enzymes: No results for input(s): CKTOTAL, CKMB, CKMBINDEX, TROPONINI in the last 168 hours. BNP (last 3 results)  Recent Labs  02/26/15 1726  BNP 187.0*    ProBNP (last 3 results) No results for input(s): PROBNP in the last 8760 hours.  CBG:  Recent Labs Lab 03/16/15 1202 03/16/15 1642 03/16/15 2013 03/17/15 0727 03/17/15 1120  GLUCAP 117* 142* 160* 142* 149*    Recent Results (from the past 240 hour(s))  MRSA PCR Screening     Status: None   Collection Time: 03/07/15 11:40 PM  Result Value Ref  Range Status   MRSA by PCR NEGATIVE NEGATIVE Final    Comment:        The GeneXpert MRSA Assay (FDA approved for NASAL specimens only), is one component of a comprehensive MRSA colonization surveillance program. It is not intended to diagnose MRSA infection nor to guide or monitor treatment for MRSA infections.      Studies: No results found.  Scheduled Meds: . antiseptic oral rinse  7 mL Mouth Rinse BID  . atorvastatin  40 mg Oral q1800  . cefTRIAXone (ROCEPHIN)  IV  1 g Intravenous Q24H  . gabapentin  300 mg Oral QHS  . heparin  5,000 Units Subcutaneous 3 times per day  . insulin aspart  0-5 Units Subcutaneous QHS  . insulin aspart  0-9 Units Subcutaneous TID WC  . iron polysaccharides  150 mg  Oral Daily  . ketorolac  1 drop Both Eyes TID  . metoCLOPramide  5 mg Oral TID AC & HS  . metoprolol tartrate  25 mg Oral BID  . saccharomyces boulardii  250 mg Oral BID  . sodium chloride  3 mL Intravenous Q12H  . tamsulosin  0.4 mg Oral Daily  . vancomycin  125 mg Oral 4 times per day   Continuous Infusions: . sodium chloride 75 mL/hr at 03/17/15 0458  . dextrose 5 % and 0.9 % NaCl with KCl 20 mEq/L 75 mL/hr at 03/16/15 0206    Principal Problem:   Sepsis secondary to UTI Active Problems:   Acute renal failure   Dehydration   Diabetes   CKD (chronic kidney disease) stage 4, GFR 15-29 ml/min   Anemia, chronic disease   Fracture, femur, distal   UTI (lower urinary tract infection)   C. difficile colitis   Sepsis due to urinary tract infection   Protein-calorie malnutrition, severe   [facility], [place] [facility] Pager [phone]. If 7PM-7AM, please contact night-coverage at [email or web address], password [facility] 03/17/2015, 4:40 PM  LOS: 1 day

## 2015-03-18 LAB — BASIC METABOLIC PANEL
Anion gap: 7 (ref 5–15)
BUN: 37 mg/dL — ABNORMAL HIGH (ref 6–23)
CHLORIDE: 110 mmol/L (ref 96–112)
CO2: 23 mmol/L (ref 19–32)
CREATININE: 2.37 mg/dL — AB (ref 0.50–1.35)
Calcium: 7.7 mg/dL — ABNORMAL LOW (ref 8.4–10.5)
GFR, EST AFRICAN AMERICAN: 28 mL/min — AB (ref >90)
GFR, EST NON AFRICAN AMERICAN: 24 mL/min — AB (ref >90)
Glucose, Bld: 133 mg/dL — ABNORMAL HIGH (ref 70–99)
POTASSIUM: 4.9 mmol/L (ref 3.5–5.1)
Sodium: 140 mmol/L (ref 135–145)

## 2015-03-18 LAB — URINE CULTURE: Colony Count: >=100000

## 2015-03-18 LAB — GLUCOSE, CAPILLARY
GLUCOSE-CAPILLARY: 156 mg/dL — AB (ref 70–99)
Glucose-Capillary: 116 mg/dL — ABNORMAL HIGH (ref 70–99)
Glucose-Capillary: 203 mg/dL — ABNORMAL HIGH (ref 70–99)
Glucose-Capillary: 325 mg/dL — ABNORMAL HIGH (ref 70–99)

## 2015-03-18 LAB — CBC
HCT: 25.8 % — ABNORMAL LOW (ref 39.0–52.0)
Hemoglobin: 7.8 g/dL — ABNORMAL LOW (ref 13.0–17.0)
MCH: 27.8 pg (ref 26.0–34.0)
MCHC: 30.2 g/dL (ref 30.0–36.0)
MCV: 91.8 fL (ref 78.0–100.0)
PLATELETS: 190 10*3/uL (ref 150–400)
RBC: 2.81 MIL/uL — ABNORMAL LOW (ref 4.22–5.81)
RDW: 14.1 % (ref 11.5–15.5)
WBC: 5 10*3/uL (ref 4.0–10.5)

## 2015-03-18 LAB — CLOSTRIDIUM DIFFICILE BY PCR: Toxigenic C. Difficile by PCR: NEGATIVE

## 2015-03-18 MED ORDER — ENSURE ENLIVE PO LIQD
237.0000 mL | Freq: Two times a day (BID) | ORAL | Status: DC
  Administered 2015-03-18 – 2015-03-20 (×4): 237 mL via ORAL

## 2015-03-18 MED ORDER — OXYBUTYNIN CHLORIDE 5 MG PO TABS
5.0000 mg | ORAL_TABLET | Freq: Three times a day (TID) | ORAL | Status: DC | PRN
  Administered 2015-03-18 – 2015-03-19 (×4): 5 mg via ORAL
  Filled 2015-03-18 (×3): qty 1

## 2015-03-18 NOTE — Progress Notes (Signed)
[name] PROGRESS NOTE  Prospero Mahnke Kellenberger NLG:[id number] DOB: 1931-01-15 DOA: 03/15/2015 PCP: [name]., MD  Assessment/Plan: 1. Sepsis with UTI secondary to indwelling foley cath 1. Sepsis resolved 2. Pt is continued on rocephin 3. Afebrile 4. No leukocytosis 5. Afebrile 6. Blood cultures neg 7. Urine cx with >100,000 yeast. Could this be colonization?  2. Dehydration 1. Continued NS at 75cc/hr 2. Improved 3. Bladder outlet obstruction 1. Pt with indwelling foley cath 2. Blood clots successfully flushed out from cath 3. Urine still appears dark red 4. Hematuria 1. Hgb appears to be holding steady 2. Monitor closely 3. Urology was consulted 5. ARF 1. Improving with IVF 2. Will cont hydration and monitor renal function closely 6. Hx Cdiff colitis 1. On Vancomycin with florastor 2. Per family, diarrhea seems improved 3. Repeat cdiff is neg 7. DVT prophylaxis 1. SCD's  Code Status: Full Family Communication: Pt in room, family at bedside Disposition Plan: Pending   Consultants:  Urology  Procedures:    Antibiotics:  Rocephin 3/19>>>  Vancomycin 3/19>>>  HPI/Subjective: Eager to go home. No acute events noted  Objective: Filed Vitals:   03/17/15 1506 03/17/15 2112 03/18/15 0635 03/18/15 1523  BP: 152/48 162/58 155/57 143/49  Pulse: 65 71 63 70  Temp: 97.7 F (36.5 C) 98.4 F (36.9 C) 97.5 F (36.4 C) 98.2 F (36.8 C)  TempSrc: Axillary Oral Oral Oral  Resp: 18 18 18 18   Height:      Weight:      SpO2: 100% 97% 99% 99%    Intake/Output Summary (Last 24 hours) at 03/18/15 1601 Last data filed at 03/18/15 1523  Gross per 24 hour  Intake 1917.5 ml  Output   2302 ml  Net -384.5 ml   Filed Weights   03/16/15 0213  Weight: 58.514 kg (129 lb)    Exam:   General:  Awake, in nad  Cardiovascular: regular, s1, s2  Respiratory: normal resp effort, no wheezing  Abdomen: soft,nondistended  Musculoskeletal: perfused, no clubbing    Data Reviewed: Basic Metabolic Panel:  Recent Labs Lab 03/15/15 2124 03/16/15 0644 03/17/15 0642 03/18/15 0527  NA 136 137 140 140  K 4.8 4.5 4.8 4.9  CL 98 103 110 110  CO2 25 24 22 23   GLUCOSE 62* 121* 103* 133*  BUN 61* 59* 48* 37*  CREATININE 4.00* 3.52* 2.87* 2.37*  CALCIUM 8.2* 7.7* 7.7* 7.7*   Liver Function Tests:  Recent Labs Lab 03/17/15 0642  AST 22  ALT 9  ALKPHOS 71  BILITOT 0.4  PROT 6.8  ALBUMIN 2.5*   No results for input(s): LIPASE, AMYLASE in the last 168 hours. No results for input(s): AMMONIA in the last 168 hours. CBC:  Recent Labs Lab 03/15/15 2124 03/16/15 0644 03/17/15 0642 03/17/15 1557 03/18/15 0527  WBC 8.8 6.1 4.2  --  5.0  NEUTROABS 6.3  --   --   --   --   HGB 9.8* 8.5* 7.8* 8.4* 7.8*  HCT 31.5* 27.0* 25.1* 27.1* 25.8*  MCV 90.0 90.0 90.6  --  91.8  PLT 233 198 179  --  190   Cardiac Enzymes: No results for input(s): CKTOTAL, CKMB, CKMBINDEX, TROPONINI in the last 168 hours. BNP (last 3 results)  Recent Labs  02/26/15 1726  BNP 187.0*    ProBNP (last 3 results) No results for input(s): PROBNP in the last 8760 hours.  CBG:  Recent Labs Lab 03/17/15 1120 03/17/15 1648 03/17/15 2030 03/18/15 0736 03/18/15 1156  GLUCAP 149* 158* 214* 116* 156*    Recent Results (from the past 240 hour(s))  Urine culture     Status: None   Collection Time: 03/15/15 10:38 PM  Result Value Ref Range Status   Specimen Description URINE, CLEAN CATCH  Final   Special Requests NONE  Final   Colony Count   Final    >=100,000 COLONIES/ML Performed at [place] Performed at [facility]   Final   Report Status 03/18/2015 FINAL  Final  Clostridium Difficile by PCR     Status: None   Collection Time: 03/17/15 11:04 PM  Result Value Ref Range Status   C difficile by pcr NEGATIVE NEGATIVE Final     Studies: No results found.  Scheduled Meds: . antiseptic oral rinse  7 mL Mouth Rinse BID   . atorvastatin  40 mg Oral q1800  . cefTRIAXone (ROCEPHIN)  IV  1 g Intravenous Q24H  . feeding supplement (ENSURE ENLIVE)  237 mL Oral BID BM  . gabapentin  300 mg Oral QHS  . insulin aspart  0-5 Units Subcutaneous QHS  . insulin aspart  0-9 Units Subcutaneous TID WC  . iron polysaccharides  150 mg Oral Daily  . ketorolac  1 drop Both Eyes TID  . metoCLOPramide  5 mg Oral TID AC & HS  . metoprolol tartrate  25 mg Oral BID  . saccharomyces boulardii  250 mg Oral BID  . sodium chloride  3 mL Intravenous Q12H  . tamsulosin  0.4 mg Oral Daily  . vancomycin  125 mg Oral 4 times per day   Continuous Infusions: . sodium chloride 75 mL/hr at 03/18/15 0929  . dextrose 5 % and 0.9 % NaCl with KCl 20 mEq/L 75 mL/hr at 03/16/15 0206    Principal Problem:   Sepsis secondary to UTI Active Problems:   Acute renal failure   Dehydration   Diabetes   CKD (chronic kidney disease) stage 4, GFR 15-29 ml/min   Anemia, chronic disease   Fracture, femur, distal   UTI (lower urinary tract infection)   C. difficile colitis   Sepsis due to urinary tract infection   Protein-calorie malnutrition, severe   [facility], [place] [facility] Pager [phone]. If 7PM-7AM, please contact night-coverage at [email or web address], password [facility] 03/18/2015, 4:01 PM  LOS: 2 days

## 2015-03-18 NOTE — Progress Notes (Signed)
Dressing to left heal cleanses as ordered by wound nurse. Dry gauze applied after betadine dry. Wrapped with kerlix and boot applied. Tolerated well. Wife at bedside.

## 2015-03-18 NOTE — Progress Notes (Signed)
PT Cancellation Note  Patient Details Name: Douglas [name] MRN: [id number] DOB: 1931-02-18   Cancelled Treatment:    Reason Eval/Treat Not Completed: Patient declined, no reason specified.  Pt appears rather angry.  He feels that nothing is "getting done".  He wants to go home.  He refuses any PT today.  I will try again though later this afternoon.   Demetrios Isaacs L 03/18/2015, 11:33 AM

## 2015-03-18 NOTE — Consult Note (Signed)
WOC wound consult note Reason for Consult: Unstageable PrU to the left lateral heel Wound type:Pressure Pressure Ulcer POA: Yes Measurement:3cm x 4cm in an area of mild erythema measuring 5cm x 5cm.  Unable to determine depth due to the presence of stable, dry eschar that is firmly adherent. Wound bed:As described above Drainage (amount, consistency, odor) None Periwound:As described above Dressing procedure/placement/frequency: Patient with diabetes and with low intake of protein needed for wound healing. Sustained PrU following right leg fracture and used left heel to reposition, resulting in shear injury. Stable dry eschar is being managed by [facility] with petrolatum dressing topped with dry gauze and secured with Kerlix.  I will change this POC to twice daily betadine swabstick application and allow that to air dry prior to placing a dry gauze and securing with Kerlix.  The pressure redistribution heel boot is to be used at all times while in bed. [place] nursing team will not follow, but will remain available to this patient, the nursing and medical team.  Please re-consult if needed. Thanks, Maudie Flakes, MSN, RN, [place], [place], [place] [phone])

## 2015-03-18 NOTE — Clinical Documentation Improvement (Signed)
MD's, NP's, and PA's   A cause and effect relationship may not be assumed and must be documented by a provider.   Noted admitted with  "Sepsis due to UTI"  patient also has indwelling foley catheter.  If the foley catheter is cause for UTI please document in chart. Thank you    Are the condition:  ? UTI  due to or associated with foley catheter ? Other (please specify)  ? Unrelated to each other ? Unable to determine ? Unknown    Treatment: IV antibiotics  Thank you, Ree Kida ,RN Clinical Documentation Specialist:  [place] Information Management

## 2015-03-19 LAB — BASIC METABOLIC PANEL
ANION GAP: 8 (ref 5–15)
BUN: 27 mg/dL — ABNORMAL HIGH (ref 6–23)
CHLORIDE: 112 mmol/L (ref 96–112)
CO2: 22 mmol/L (ref 19–32)
Calcium: 7.8 mg/dL — ABNORMAL LOW (ref 8.4–10.5)
Creatinine, Ser: 2.08 mg/dL — ABNORMAL HIGH (ref 0.50–1.35)
GFR calc Af Amer: 33 mL/min — ABNORMAL LOW (ref >90)
GFR calc non Af Amer: 28 mL/min — ABNORMAL LOW (ref >90)
Glucose, Bld: 93 mg/dL (ref 70–99)
POTASSIUM: 5.4 mmol/L — AB (ref 3.5–5.1)
Sodium: 142 mmol/L (ref 135–145)

## 2015-03-19 LAB — GLUCOSE, CAPILLARY
GLUCOSE-CAPILLARY: 176 mg/dL — AB (ref 70–99)
Glucose-Capillary: 93 mg/dL (ref 70–99)
Glucose-Capillary: 133 mg/dL — ABNORMAL HIGH (ref 70–99)
Glucose-Capillary: 130 mg/dL — ABNORMAL HIGH (ref 70–99)

## 2015-03-19 LAB — CBC
HCT: 26 % — ABNORMAL LOW (ref 39.0–52.0)
Hemoglobin: 8.1 g/dL — ABNORMAL LOW (ref 13.0–17.0)
MCH: 28.5 pg (ref 26.0–34.0)
MCHC: 31.2 g/dL (ref 30.0–36.0)
MCV: 91.5 fL (ref 78.0–100.0)
PLATELETS: 172 10*3/uL (ref 150–400)
RBC: 2.84 MIL/uL — ABNORMAL LOW (ref 4.22–5.81)
RDW: 13.9 % (ref 11.5–15.5)
WBC: 5.6 10*3/uL (ref 4.0–10.5)

## 2015-03-19 NOTE — Plan of Care (Signed)
Problem: Phase I Progression Outcomes Goal: Voiding-avoid urinary catheter unless indicated Outcome: Not Progressing Foley remains in place d/t retention - urology to follow

## 2015-03-19 NOTE — Evaluation (Signed)
Occupational Therapy Evaluation Patient Details Name: Douglas [name] MRN: [id number] DOB: [birth year] Today's Date: 03/19/2015    History of Present Illness Douglas [name] is an 81 y.o. male recently admitted for UTI, having hx of urinary retention, on chronic foley for over a month, hx of HTN, DM, hperlipidemia, hx of prostate cancer, upper GI bleed, neuropathy, sent to the ER as it was noted that his urine looks dark, and it has a foul smell. He was recently diagnosed with C diff colitis as well, and had just finished his Flagyl, though still having diarrhea yesterday three times. Evalatuion in the ER showed UA postitive for persistent infection, and his Lab showed AKI on CKD with Cr now at 4, where as 10 days ago, it was 2.9. He has no leukocytosis, and his foley was changed during his last admission. There were prior discussion about having a suprapubic catheter placement for him.    Clinical Impression   PTA pt lived at home with wife and was receiving [facility] PT/OT/Nsg services from [facility]. Pt awake, alert, and oriented to person, time, and place; pt demonstrates mild confusion as to his situation. Pt wife reports pt is at baseline with BADLs, receiving assistance in dressing, bathing, functional mobility, meal preparation, and medication management. Pt demonstrates good BUE range of motion and strength (4/5).  Pt wife reports she would like pt to return home with continued [facility] services and 24/hr supervision/assistance. No acute OT services required this time.     Follow Up Recommendations  Home health OT;Supervision/Assistance - 24 hour    Equipment Recommendations  None recommended by OT       Precautions / Restrictions Precautions Precautions: Fall Restrictions Weight Bearing Restrictions: Yes RLE Weight Bearing: Weight bearing as tolerated              ADL Overall ADL's : Needs assistance/impaired;At baseline Eating/Feeding: Independent                    Lower Body Dressing: Maximal assistance (per wife report)                 General ADL Comments: Pt receives assistance from wife, children, and [place] services for all B/IADLs. Pt wife reports pt is able to transfer from bed to chair independently. Wife assists with dressing, bathing, meal prep, and medication management     Vision Vision Assessment?: No apparent visual deficits          Pertinent Vitals/Pain Pain Assessment: No/denies pain     Hand Dominance Right   Extremity/Trunk Assessment Upper Extremity Assessment Upper Extremity Assessment: Overall WFL for tasks assessed   Lower Extremity Assessment Lower Extremity Assessment: Defer to PT evaluation       Communication Communication Communication: No difficulties   Cognition Arousal/Alertness: Awake/alert Behavior During Therapy: WFL for tasks assessed/performed Overall Cognitive Status: Within Functional Limits for tasks assessed                                Home Living Family/patient expects to be discharged to:: Private residence Living Arrangements: Spouse/significant other Available Help at Discharge: Family;Friend(s);Neighbor;Home health;Available 24 hours/day Type of Home: House Home Access: Ramped entrance           Bathroom Shower/Tub: Tub/shower unit         Home Equipment: Walker - 2 wheels;Cane - single point;Tub bench;Wheelchair - manual;Hospital bed  Prior Functioning/Environment Level of Independence: Needs assistance    ADL's / Homemaking Assistance Needed: Pt wife & children assist in all B/IADLs. [facility] provides PT/OT/RN home health services                                  End of Session    Activity Tolerance: Patient tolerated treatment well Patient left: in bed;with call bell/phone within reach;with family/visitor present   Time: 5852-7782 OT Time Calculation (min): 21 min Charges:  OT General Charges $OT Visit: 1  Procedure OT Evaluation $Initial OT Evaluation Tier I: 1 Procedure  Guadelupe Sabin, OTR/L  [phone]  03/19/2015, 9:36 AM

## 2015-03-19 NOTE — Progress Notes (Signed)
Inpatient Diabetes Program Recommendations  AACE/ADA: New Consensus Statement on Inpatient Glycemic Control (2013)  Target Ranges:  Prepandial:   less than 140 mg/dL      Peak postprandial:   less than 180 mg/dL (1-2 hours)      Critically ill patients:  140 - 180 mg/dL   Results for CHEVY, SWEIGERT (MRN [id number]) as of 03/19/2015 09:29  Ref. Range 03/18/2015 07:36 03/18/2015 11:56 03/18/2015 16:58 03/18/2015 21:17 03/19/2015 07:52  Glucose-Capillary Latest Range: 70-99 mg/dL 116 (H) 156 (H) 203 (H) 325 (H) 93    Outpatient Diabetes medications: Lantus 10 units QHS, Novolog 0-15 units TID with meals Current orders for Inpatient glycemic control: Novolog 0-9 units TID with meals, Novolog 0-5 units HS  Inpatient Diabetes Program Recommendations Correction (SSI): Please consider increasing Novolog correction to moderate scale. Diet: If appropriate, please consider switching Ensure to Glucerna to help improve glycemic control.  Thanks, Barnie Alderman, RN, MSN, CCRN, CDE Diabetes Coordinator Inpatient Diabetes Program [phone] (Team Pager from [place] to [place]) [phone] ([facility] office) [phone] [facility] office)

## 2015-03-19 NOTE — Consult Note (Signed)
Urology Consult  Consulting DS:KAJG  CC: Recurrent UTI  HPI: This is a 81 year old male with a history of urinary retention, managed with a Foley catheter. He also has a history of advanced prostate cancer. He has been admitted several times for recurrent urinary tract infections and complications thereof. Ultrasound recently revealed no obstruction/hydronephrosis. The patient is recently readmitted for foul-smelling urine, mental status changes and UTI.  PMH: Past Medical History  Diagnosis Date  . Hypertension   . Diabetes mellitus   . Hyperlipidemia   . Cataracts, bilateral   . Hyperkalemia   . UTI (lower urinary tract infection)   . Calculus of kidney   . Sensorineural hearing loss, unspecified   . Epiglottitis   . Chronic ankle pain   . Neuromuscular disorder     neurophaty lower legs  . Upper GI bleeding     while at [facility] 12/2014  . Prostate cancer 2009    radiation  . Confusion   . Gastroparesis due to DM: DELAYED GASTRIC EMPTYING STUDY 02/08/15 02/08/2015    PSH: Past Surgical History  Procedure Laterality Date  . Eye surgery  sept. 2015    cataract surgery  . Hernia repair      severa; years can't remeber  . Femur im nail Right 01/18/2015    Procedure: INTRAMEDULLARY (IM) RETROGRADE FEMORAL NAILING;  Surgeon: Renette Butters, MD;  Location: [place];  Service: Orthopedics;  Laterality: Right;    Allergies: No Known Allergies  Medications: Prescriptions prior to admission  Medication Sig Dispense Refill Last Dose  . acetaminophen (TYLENOL) 500 MG tablet Take 500 mg by mouth every 6 (six) hours as needed for mild pain or moderate pain.   03/15/2015 at Unknown time  . atorvastatin (LIPITOR) 40 MG tablet Take 40 mg by mouth daily.   03/15/2015 at Unknown time  . gabapentin (NEURONTIN) 300 MG capsule Take 1 capsule (300 mg total) by mouth at bedtime. 30 capsule 1 03/14/2015 at Unknown time  . insulin aspart (NOVOLOG) 100 UNIT/ML injection insulin aspart (novoLOG)  injection 0-15 Units 0-15 Units, Subcutaneous, 3 times daily with meals CBG 70 - 120: 0 units CBG 121 - 150: 2 units CBG 151 - 200: 3 units CBG 201 - 250: 5 units CBG 251 - 300: 8 units CBG 301 - 350: 11 units CBG 351 - 400: 15 units CBG > 400: call MD (Patient taking differently: Inject 0-15 Units into the skin 3 (three) times daily with meals. insulin aspart (novoLOG) injection 0-15 Units 0-15 Units, Subcutaneous, 3 times daily with meals CBG 70 - 120: 0 units CBG 121 - 150: 2 units CBG 151 - 200: 3 units CBG 201 - 250: 5 units CBG 251 - 300: 8 units CBG 301 - 350: 11 units CBG 351 - 400: 15 units CBG > 400: call MD) 10 mL 11 03/15/2015 at Unknown time  . insulin detemir (LEVEMIR) 100 UNIT/ML injection Inject 0.1 mLs (10 Units total) into the skin at bedtime. 10 mL 11 03/14/2015 at Unknown time  . ketorolac (ACULAR) 0.5 % ophthalmic solution Place 1 drop into both eyes 3 (three) times daily.   03/15/2015 at Unknown time  . metoCLOPramide (REGLAN) 5 MG tablet Take 1 tablet (5 mg total) by mouth 4 (four) times daily -  before meals and at bedtime. 120 tablet 0 03/15/2015 at Unknown time  . metoprolol tartrate (LOPRESSOR) 25 MG tablet Take 1 tablet (25 mg total) by mouth 2 (two) times daily. 60 tablet 1  Past Week at Unknown time  . metroNIDAZOLE (FLAGYL) 500 MG tablet Take 1 tablet (500 mg total) by mouth 3 (three) times daily. 30 tablet 0 03/15/2015 at Unknown time  . OVER THE COUNTER MEDICATION Place 2 suppositories rectally daily as needed (fever). Tylenol suppository   03/14/2015 at Unknown time  . oxybutynin (DITROPAN) 5 MG tablet Take 1 tablet (5 mg total) by mouth every 8 (eight) hours as needed for bladder spasms. 20 tablet 0 03/15/2015 at Unknown time  . saccharomyces boulardii (FLORASTOR) 250 MG capsule Take 1 capsule (250 mg total) by mouth 2 (two) times daily. 60 capsule 1 03/15/2015 at Unknown time  . tamsulosin (FLOMAX) 0.4 MG CAPS capsule Take 1 capsule (0.4 mg total) by mouth daily.  30 capsule 0 03/15/2015 at Unknown time  . torsemide (DEMADEX) 20 MG tablet Take 1 tablet (20 mg total) by mouth daily. 30 tablet 1 03/15/2015 at Unknown time  . iron polysaccharides (NIFEREX) 150 MG capsule Take 1 capsule (150 mg total) by mouth daily. (Patient not taking: Reported on 03/15/2015) 30 capsule 0 Not Taking at Unknown time  . polyethylene glycol (MIRALAX / GLYCOLAX) packet Take 17 g by mouth 2 (two) times daily as needed for moderate constipation. Hold if develops diarrhea. (Patient not taking: Reported on 03/15/2015) 14 each 0 Not Taking at Unknown time  . traMADol (ULTRAM) 50 MG tablet Take 2 tablets (100 mg total) by mouth every 6 (six) hours as needed for severe pain. (Patient not taking: Reported on 03/15/2015) 20 tablet 0 Not Taking at Unknown time     Social History: History   Social History  . Marital Status: Married    Spouse Name: N/A  . Number of Children: N/A  . Years of Education: N/A   Occupational History  . Not on file.   Social History Main Topics  . Smoking status: Never Smoker   . Smokeless tobacco: Not on file  . Alcohol Use: No  . Drug Use: No  . Sexual Activity: Not on file   Other Topics Concern  . Not on file   Social History Narrative    Family History: History reviewed. No pertinent family history.  Review of Systems: Positive: Mental status change, foul-smelling/discolored urine, inability to urinate Negative:  A further 10 point review of systems was negative except what is listed in the HPI.  Physical Exam: @VITALS2 @ General: No acute distress.  Awake. Head:  Normocephalic.  Atraumatic. ENT:  EOMI.  Mucous membranes moist Neck:  Supple.  No lymphadenopathy. CV:  S1 present. S2 present. Regular rate. Pulmonary: Equal effort bilaterally.  Clear to auscultation bilaterally. Abdomen: Soft.  Non- tender to palpation. Skin:  Normal turgor.  No visible rash. Extremity: No gross deformity of bilateral upper extremities.  No gross  deformity of    bilateral lower extremities. Neurologic: Alert. Appropriate mood.   Urethra: Foley catheter in place.  Orthotopic meatus. Scrotum: No lesions.  No ecchymosis.  No erythema.   Studies:  Recent Labs     03/18/15  0527  03/19/15  0514  HGB  7.8*  8.1*  WBC  5.0  5.6  PLT  190  172    Recent Labs     03/18/15  0527  03/19/15  0514  NA  140  142  K  4.9  5.4*  CL  110  112  CO2  23  22  BUN  37*  27*  CREATININE  2.37*  2.08*  CALCIUM  7.7*  7.8*  GFRNONAA  24*  28*  GFRAA  28*  33*     No results for input(s): INR, APTT in the last 72 hours.  Invalid input(s): PT   Invalid input(s): ABG    Assessment:  Urinary retention with Foley catheter in place  Recurrent UTI secondary to catheterization  Plan: I had a long discussion with the patient and his wife. It is unfortunate that he has had some many UTIs with this Foley catheter in place. Unfortunately, I do not think that a suprapubic tube will limit the frequency of his UTIs-that is still a foreign body in his bladder, just through a new opening. Treatment options are as follows:  1. Voiding trial, and hopefully he will have low urinary residual. I don't think this is best long-term consideration as he did have significant voiding symptoms for catheterization and he is quite debilitated which would limit bladder function  2. Acetic acid irrigations, 0.25% twice daily to hopefully decrease his frequent UTIs with the catheter in place  3. Teaching the patient and his wife intermittent catheterization. I think this will be the best long-term management, as I do not expect him to empty his bladder well.  I have ordered his catheter to come out in the morning. We will check residuals with in and out catheterization. I think we should teach the patient and his wife in and out catheterization and have him do this long-term, as I think that the chance of recovery from his retention  is quite  low.    Pager:[phone]

## 2015-03-19 NOTE — Progress Notes (Addendum)
[name] PROGRESS NOTE  Douglas [name] TML:[id number] DOB: August 20, 1931 DOA: 03/15/2015 PCP: Douglas Chroman., MD   Off Service Summary: 81yo who presented with uti with sepsis. The patient recently finished tx for cdiff colitis but still had residual diarrhea. On admission, the patient was continued on rocephin with improvement in symptoms. The patient was also noted to have ARF that has improved with IVF. In addition, the patient has a hx of bladder outlet obstruction and had been continued on indwelling foley cath with gross hematuria noted. Urology was consulted with recommendations for I/O cath when discharged home.   Assessment/Plan: 1. Sepsis with UTI secondary to indwelling foley cath 1. Sepsis resolved 2. Pt is continued on rocephin 3. Afebrile 4. No leukocytosis 5. Afebrile 6. Blood cultures neg 7. Urine cx with >100,000 yeast. Could this be colonization? 8. Urology consulted. Appreciate recs. Recommendations to d/c foley in AM. Possible i/o cath after pt returns home 2. Dehydration 1. Currently continued on NS at 75cc/hr 2. Improved 3. Bladder outlet obstruction 1. Per above, plans for d/c foley trial in AM. Recs for i/o cath at home ultimately 2. Currently with indwelling foley cath 4. Hematuria 1. Hgb appears to be holding steady 2. Monitor closely 3. Urology recs noted 5. ARF 1. Improving with IVF 2. Will cont hydration and monitor renal function closely 3. Renal function currently is best it has been this month 6. Hx Cdiff colitis 1. Was continued on oral Vancomycin with florastor 2. Per family, diarrhea seems improved 3. Repeat cdiff is neg 7. DVT prophylaxis 1. SCD's  Code Status: Full Family Communication: Pt in room, family at bedside Disposition Plan: Pending   Consultants:  Urology  Procedures:    Antibiotics:  Rocephin 3/19>>>  Oral Vancomycin 3/19>>>  HPI/Subjective: Pt is looking forward to going home  Objective: Filed  Vitals:   03/18/15 0635 03/18/15 1523 03/18/15 2112 03/19/15 0642  BP: 155/57 143/49 179/68 141/64  Pulse: 63 70 73 71  Temp: 97.5 F (36.4 C) 98.2 F (36.8 C) 98.2 F (36.8 C) 98.8 F (37.1 C)  TempSrc: Oral Oral Oral Axillary  Resp: 18 18 18 18   Height:      Weight:      SpO2: 99% 99% 100% 100%    Intake/Output Summary (Last 24 hours) at 03/19/15 1429 Last data filed at 03/19/15 0643  Gross per 24 hour  Intake 3358.75 ml  Output   1700 ml  Net 1658.75 ml   Filed Weights   03/16/15 0213  Weight: 58.514 kg (129 lb)    Exam:   General:  Awake, in nad  Cardiovascular: regular, s1, s2  Respiratory: normal resp effort, no wheezing  Abdomen: soft,nondistended  Musculoskeletal: perfused, no clubbing   Data Reviewed: Basic Metabolic Panel:  Recent Labs Lab 03/15/15 2124 03/16/15 0644 03/17/15 0642 03/18/15 0527 03/19/15 0514  NA 136 137 140 140 142  K 4.8 4.5 4.8 4.9 5.4*  CL 98 103 110 110 112  CO2 25 24 22 23 22   GLUCOSE 62* 121* 103* 133* 93  BUN 61* 59* 48* 37* 27*  CREATININE 4.00* 3.52* 2.87* 2.37* 2.08*  CALCIUM 8.2* 7.7* 7.7* 7.7* 7.8*   Liver Function Tests:  Recent Labs Lab 03/17/15 0642  AST 22  ALT 9  ALKPHOS 71  BILITOT 0.4  PROT 6.8  ALBUMIN 2.5*   No results for input(s): LIPASE, AMYLASE in the last 168 hours. No results for input(s): AMMONIA in the last 168 hours. CBC:  Recent Labs  Lab 03/15/15 2124 03/16/15 0644 03/17/15 0642 03/17/15 1557 03/18/15 0527 03/19/15 0514  WBC 8.8 6.1 4.2  --  5.0 5.6  NEUTROABS 6.3  --   --   --   --   --   HGB 9.8* 8.5* 7.8* 8.4* 7.8* 8.1*  HCT 31.5* 27.0* 25.1* 27.1* 25.8* 26.0*  MCV 90.0 90.0 90.6  --  91.8 91.5  PLT 233 198 179  --  190 172   Cardiac Enzymes: No results for input(s): CKTOTAL, CKMB, CKMBINDEX, TROPONINI in the last 168 hours. BNP (last 3 results)  Recent Labs  02/26/15 1726  BNP 187.0*    ProBNP (last 3 results) No results for input(s): PROBNP in the last  8760 hours.  CBG:  Recent Labs Lab 03/18/15 1156 03/18/15 1658 03/18/15 2117 03/19/15 0752 03/19/15 1211  GLUCAP 156* 203* 325* 93 133*    Recent Results (from the past 240 hour(s))  Urine culture     Status: None   Collection Time: 03/15/15 10:38 PM  Result Value Ref Range Status   Specimen Description URINE, CLEAN CATCH  Final   Special Requests NONE  Final   Colony Count   Final    >=100,000 COLONIES/ML Performed at [place] Performed at [facility]   Final   Report Status 03/18/2015 FINAL  Final  Clostridium Difficile by PCR     Status: None   Collection Time: 03/17/15 11:04 PM  Result Value Ref Range Status   C difficile by pcr NEGATIVE NEGATIVE Final     Studies: No results found.  Scheduled Meds: . antiseptic oral rinse  7 mL Mouth Rinse BID  . atorvastatin  40 mg Oral q1800  . cefTRIAXone (ROCEPHIN)  IV  1 g Intravenous Q24H  . feeding supplement (ENSURE ENLIVE)  237 mL Oral BID BM  . gabapentin  300 mg Oral QHS  . insulin aspart  0-5 Units Subcutaneous QHS  . insulin aspart  0-9 Units Subcutaneous TID WC  . iron polysaccharides  150 mg Oral Daily  . ketorolac  1 drop Both Eyes TID  . metoCLOPramide  5 mg Oral TID AC & HS  . metoprolol tartrate  25 mg Oral BID  . saccharomyces boulardii  250 mg Oral BID  . sodium chloride  3 mL Intravenous Q12H  . tamsulosin  0.4 mg Oral Daily  . vancomycin  125 mg Oral 4 times per day   Continuous Infusions: . sodium chloride 75 mL/hr at 03/19/15 1424  . dextrose 5 % and 0.9 % NaCl with KCl 20 mEq/L 75 mL/hr at 03/16/15 0206    Principal Problem:   Sepsis secondary to UTI Active Problems:   Acute renal failure   Dehydration   Diabetes   CKD (chronic kidney disease) stage 4, GFR 15-29 ml/min   Anemia, chronic disease   Fracture, femur, distal   UTI (lower urinary tract infection)   C. difficile colitis   Sepsis due to urinary tract infection   Protein-calorie  malnutrition, severe   Douglas [name], [facility] Pager [phone]. If 7PM-7AM, please contact night-coverage at [email or web address], password [facility] 03/19/2015, 2:29 PM  LOS: 3 days

## 2015-03-19 NOTE — Evaluation (Signed)
Physical Therapy Evaluation Patient Details Name: Douglas [name] MRN: [id number] DOB: 09/09/1931 Today's Date: 03/19/2015   History of Present Illness  Douglas [name] is an 81 y.o. male recently admitted for UTI, having hx of urinary retention, on chronic foley for over a month, hx of HTN, DM, hperlipidemia, hx of prostate cancer, upper GI bleed, neuropathy, sent to the ER as it was noted that his urine looks dark, and it has a foul smell. He was recently diagnosed with C diff colitis as well, and had just finished his Flagyl, though still having diarrhea yesterday three times. Evalatuion in the ER showed UA postitive for persistent infection, and his Lab showed AKI on CKD with Cr now at 4, where as 10 days ago, it was 2.9. He has no leukocytosis, and his foley was changed during his last admission. There were prior discussion about having a suprapubic catheter placement for him. Pt lives with his wife and they have excellent support of family and friends.  He is well known to the staff here from frequent admission.  Pt is s/p right femoral fx for which he is now WBAT.  He has a decubitus ulcer on the left heel for which he wears a prevlon boot when in bed.   He was home for one week between admissions and only saw HHPT once during that time.  He was only able to take 2 steps at that time.  Clinical Impression  Pt is seen for evaluation/tx.  He is in a much better mood today and is cooperative with all exercise.  His strength is surprisingly good in spite of his multiple hospital stays.  His major problem is poor endurance.  He is able to transfer with min assist to standing and was able to ambulate 15' with a walker, WBAT right.  He was so tired, though, that he was unable to sit in a recliner and had to be returned to bed.  He should be able to transition to home at d/c with HHPT.    Follow Up Recommendations Home health PT    Equipment Recommendations  None recommended by PT     Recommendations for Other Services   OT?    Precautions / Restrictions Precautions Precautions: Fall Precaution Comments: pt reports that he is no longer using the right knee immobilizer for gait Restrictions Weight Bearing Restrictions: No RLE Weight Bearing: Weight bearing as tolerated      Mobility  Bed Mobility Overal bed mobility: Modified Independent Bed Mobility: Supine to Sit     Supine to sit: Modified independent (Device/Increase time)        Transfers Overall transfer level: Needs assistance Equipment used: Rolling walker (2 wheeled) Transfers: Sit to/from Stand Sit to Stand: Min assist            Ambulation/Gait Ambulation/Gait assistance: Museum/gallery curator (Feet): 15 Feet Assistive device: Rolling walker (2 wheeled) Gait Pattern/deviations: Shuffle;Trunk flexed;Decreased step length - left Gait velocity: appropriate for situation Gait velocity interpretation: Below normal speed for age/gender    Stairs            Wheelchair Mobility    Modified Rankin (Stroke Patients Only)       Balance Overall balance assessment: Needs assistance Sitting-balance support: No upper extremity supported;Feet supported Sitting balance-Leahy Scale: Good     Standing balance support: Bilateral upper extremity supported Standing balance-Leahy Scale: Good  Pertinent Vitals/Pain Pain Assessment: No/denies pain    Home Living Family/patient expects to be discharged to:: Private residence Living Arrangements: Spouse/significant other Available Help at Discharge: Family;Friend(s);Neighbor;Home health;Available 24 hours/day Type of Home: House Home Access: Ramped entrance Entrance Stairs-Rails: Right;Left;Can reach both Entrance Stairs-Number of Steps: 3 Home Layout: One level Home Equipment: Walker - 2 wheels;Cane - single point;Tub bench;Wheelchair - manual;Hospital bed      Prior Function  Level of Independence: Needs assistance   Gait / Transfers Assistance Needed: not functionally ambulatory since right hip fracture due to multiple illnesses  ADL's / Homemaking Assistance Needed: Pt wife & children assist in all B/IADLs. [facility] provides PT/OT/RN home health services        Hand Dominance        Extremity/Trunk Assessment               Lower Extremity Assessment: Generalized weakness         Communication   Communication: No difficulties  Cognition Arousal/Alertness: Awake/alert Behavior During Therapy: WFL for tasks assessed/performed Overall Cognitive Status: Within Functional Limits for tasks assessed                      General Comments      Exercises General Exercises - Lower Extremity Ankle Circles/Pumps: AROM;Both;10 reps;Supine Quad Sets: AROM;Both;10 reps;Supine Gluteal Sets: AROM;Both;10 reps;Supine Heel Slides: AROM;Both;10 reps;Supine Hip ABduction/ADduction: AROM;Both;10 reps;Supine Straight Leg Raises: AROM;Both;10 reps;Supine      Assessment/Plan    PT Assessment Patient needs continued PT services  PT Diagnosis Difficulty walking;Generalized weakness   PT Problem List Decreased strength;Decreased activity tolerance;Decreased mobility  PT Treatment Interventions Gait training;Functional mobility training;Therapeutic exercise   PT Goals (Current goals can be found in the Care Plan section) Acute Rehab PT Goals Patient Stated Goal: return home PT Goal Formulation: With patient/family Time For Goal Achievement: 04/02/15    Frequency Min 3X/week   Barriers to discharge   none    Co-evaluation               End of Session Equipment Utilized During Treatment: Gait belt Activity Tolerance: Patient tolerated treatment well Patient left: in bed;with call bell/phone within reach;with family/visitor present Nurse Communication: Mobility status         Time: 1320-1403 PT Time Calculation (min)  (ACUTE ONLY): 43 min   Charges:   PT Evaluation $Initial PT Evaluation Tier I: 1 Procedure PT Treatments $Gait Training: 8-22 mins   PT G CodesSable Feil 03/19/2015, 2:12 PM

## 2015-03-19 NOTE — Care Management Utilization Note (Signed)
UR completed 

## 2015-03-19 NOTE — Care Management Note (Addendum)
    Page 1 of 1   03/20/2015     3:20:41 PM CARE MANAGEMENT NOTE 03/20/2015  Patient:  Douglas [name], Douglas [name]   Account Number:  [id number]  Date Initiated:  03/19/2015  Documentation initiated by:  Jolene Provost  Subjective/Objective Assessment:   Pt is from home, lives with wife. Pt is active with [facility] and is HRI. Pt has HH RN/PT/aid. Pt has cane/walker/WC/BSC/Hosp bed. Pt plans to discharge home with resumption of [place] services. No CM needs identified.     Action/Plan:   Will cont to follow.   Anticipated DC Date:  03/22/2015   Anticipated DC Plan:  [place]  CM consult      [facility] Choice  Resumption Of Svcs/PTA Sebastien Jackson   Choice offered to / List presented to:             Status of service:  Completed, signed off Medicare Important Message given?  YES (If response is "NO", the following Medicare IM given date fields will be blank) Date Medicare IM given:  03/20/2015 Medicare IM given by:  Jolene Provost Date Additional Medicare IM given:   Additional Medicare IM given by:    Discharge Disposition:  [place]  Per UR Regulation:  Reviewed for med. necessity/level of care/duration of stay  If discussed at [place] of Stay Meetings, dates discussed:    Comments:  03/20/2015 [place], RN, MSN, CM Pt discharging home today. Pt's wife feel comfortable performing in/out caths. [place] notified of discharge and resumption of HH order has been placed. No further CM needs.  03/19/2015 [place], RN, MSN, CM

## 2015-03-20 LAB — GLUCOSE, CAPILLARY
GLUCOSE-CAPILLARY: 86 mg/dL (ref 70–99)
GLUCOSE-CAPILLARY: 118 mg/dL — AB (ref 70–99)

## 2015-03-20 LAB — CBC
HCT: 25.2 % — ABNORMAL LOW (ref 39.0–52.0)
Hemoglobin: 7.7 g/dL — ABNORMAL LOW (ref 13.0–17.0)
MCH: 28 pg (ref 26.0–34.0)
MCHC: 30.6 g/dL (ref 30.0–36.0)
MCV: 91.6 fL (ref 78.0–100.0)
Platelets: 180 10*3/uL (ref 150–400)
RBC: 2.75 MIL/uL — ABNORMAL LOW (ref 4.22–5.81)
RDW: 14.1 % (ref 11.5–15.5)
WBC: 6.3 10*3/uL (ref 4.0–10.5)

## 2015-03-20 LAB — BASIC METABOLIC PANEL
Anion gap: 6 (ref 5–15)
BUN: 23 mg/dL (ref 6–23)
CALCIUM: 7.7 mg/dL — AB (ref 8.4–10.5)
CO2: 21 mmol/L (ref 19–32)
Chloride: 112 mmol/L (ref 96–112)
Creatinine, Ser: 2.05 mg/dL — ABNORMAL HIGH (ref 0.50–1.35)
GFR calc Af Amer: 33 mL/min — ABNORMAL LOW (ref >90)
GFR, EST NON AFRICAN AMERICAN: 29 mL/min — AB (ref >90)
GLUCOSE: 91 mg/dL (ref 70–99)
POTASSIUM: 5.8 mmol/L — AB (ref 3.5–5.1)
SODIUM: 139 mmol/L (ref 135–145)

## 2015-03-20 MED ORDER — METOPROLOL TARTRATE 25 MG PO TABS: 12.5000 mg | ORAL_TABLET | Freq: Two times a day (BID) | ORAL | Status: AC

## 2015-03-20 NOTE — Progress Notes (Signed)
PT Cancellation Note  Patient Details Name: Douglas [name] MRN: [id number] DOB: [birth year]   Cancelled Treatment:    Reason Eval/Treat Not Completed: Fatigue/lethargy limiting ability to participate.  Pt c/o general malaise.   Demetrios Isaacs L 03/20/2015, 1:41 PM

## 2015-03-20 NOTE — Discharge Summary (Signed)
Physician Discharge Summary  Douglas [name]:[id number] DOB: 23-May-1931 DOA: 03/15/2015  PCP: [name]., MD  Admit date: 03/15/2015 Discharge date: 03/20/2015  Time spent: 45 minutes  Recommendations for Outpatient Follow-up:  -Will be discharged home today. -Advised to follow up with PCP and with GU in 2 weeks.   Discharge Diagnoses:  Principal Problem:   Sepsis secondary to UTI Active Problems:   Acute renal failure   Dehydration   Diabetes   CKD (chronic kidney disease) stage 4, GFR 15-29 ml/min   Anemia, chronic disease   Fracture, femur, distal   UTI (lower urinary tract infection)   C. difficile colitis   Sepsis due to urinary tract infection   Protein-calorie malnutrition, severe   Discharge Condition: Stable and improved  Filed Weights   03/16/15 0213  Weight: 58.514 kg (129 lb)    History of present illness:  Douglas [name] is an 81 y.o. male recently admitted for UTI, having hx of urinary retention, on chronic foley for over a month, hx of HTN, DM, hperlipidemia, hx of prostate cancer, upper GI bleed, neuropathy, sent to the ER as it was noted that his urine looks dark, and it has a foul smell. He was recently diagnosed with C diff colitis as well, and had just finished his Flagyl, though still having diarrhea yesterday three times. Evalatuion in the ER showed UA postitive for persistent infection, and his Lab showed AKI on CKD with Cr now at 4, where as 10 days ago, it was 2.9. He has no leukocytosis, and his foley was changed during his last admission. There were prior discussion about having a suprapubic catheter placement for him.   Hospital Course:   1. Sepsis with UTI secondary to indwelling foley cath 1. Sepsis resolved 2. Completed treatment with rocephin 3. Afebrile 4. No leukocytosis 5. Afebrile 6. Blood cultures neg 7. Urine cx with >100,000 yeast. Likely colonization? 8. Urology recommended a voiding trial (has been doing well  and voiding spontaneously today) as well as intermittent I and O cath if unable to void after 6 hours. Patient and wife have been instructed on this and a Welcome RN will be arranged as well. 2. Dehydration 1. Resolved with IVF. 3. Bladder outlet obstruction 1. As above. 4. Hematuria 1. Hgb appears to be holding steady 2. Urology recs noted 5. ARF on CKD Stage III 1. Improving with IVF 2. It appears most recent baseline Cr are around 2.6-2.8. 3. Cr is 2.05 on DC. 6. Hx Cdiff colitis 1. Was continued on oral Vancomycin with florastor (completed treatment). 2. Per family, diarrhea has resolved 3. Repeat cdiff is neg  Procedures:  None   Consultations:  GU, Dr. Diona Fanti  Discharge Instructions  Discharge Instructions    Increase activity slowly    Complete by:  As directed             Medication List    STOP taking these medications        metroNIDAZOLE 500 MG tablet  Commonly known as:  FLAGYL     OVER THE COUNTER MEDICATION     torsemide 20 MG tablet  Commonly known as:  DEMADEX     traMADol 50 MG tablet  Commonly known as:  ULTRAM      TAKE these medications        acetaminophen 500 MG tablet  Commonly known as:  TYLENOL  Take 500 mg by mouth every 6 (six) hours as needed for mild pain or  moderate pain.     atorvastatin 40 MG tablet  Commonly known as:  LIPITOR  Take 40 mg by mouth daily.     gabapentin 300 MG capsule  Commonly known as:  NEURONTIN  Take 1 capsule (300 mg total) by mouth at bedtime.     insulin aspart 100 UNIT/ML injection  Commonly known as:  novoLOG  - insulin aspart (novoLOG) injection 0-15 Units  - 0-15 Units, Subcutaneous, 3 times daily with meals  - CBG 70 - 120: 0 units  - CBG 121 - 150: 2 units  - CBG 151 - 200: 3 units  - CBG 201 - 250: 5 units  - CBG 251 - 300: 8 units  - CBG 301 - 350: 11 units  - CBG 351 - 400: 15 units  - CBG > 400: call MD     insulin detemir 100 UNIT/ML injection  Commonly known as:   LEVEMIR  Inject 0.1 mLs (10 Units total) into the skin at bedtime.     iron polysaccharides 150 MG capsule  Commonly known as:  NIFEREX  Take 1 capsule (150 mg total) by mouth daily.     ketorolac 0.5 % ophthalmic solution  Commonly known as:  ACULAR  Place 1 drop into both eyes 3 (three) times daily.     metoCLOPramide 5 MG tablet  Commonly known as:  REGLAN  Take 1 tablet (5 mg total) by mouth 4 (four) times daily -  before meals and at bedtime.     metoprolol tartrate 25 MG tablet  Commonly known as:  LOPRESSOR  Take 0.5 tablets (12.5 mg total) by mouth 2 (two) times daily.     oxybutynin 5 MG tablet  Commonly known as:  DITROPAN  Take 1 tablet (5 mg total) by mouth every 8 (eight) hours as needed for bladder spasms.     polyethylene glycol packet  Commonly known as:  MIRALAX / GLYCOLAX  Take 17 g by mouth 2 (two) times daily as needed for moderate constipation. Hold if develops diarrhea.     saccharomyces boulardii 250 MG capsule  Commonly known as:  FLORASTOR  Take 1 capsule (250 mg total) by mouth 2 (two) times daily.     tamsulosin 0.4 MG Caps capsule  Commonly known as:  FLOMAX  Take 1 capsule (0.4 mg total) by mouth daily.       No Known Allergies     Follow-up Information    Follow up with [name],[name], MD. Schedule an appointment as soon as possible for a visit in 2 weeks.   Specialty:  Internal Medicine   Contact information:   [place] [address] [phone]       Follow up with Jorja Loa, MD. Schedule an appointment as soon as possible for a visit in 2 weeks.   Specialty:  Urology   Contact information:   [place] [address] [phone]       Follow up with [facility].   Contact information:   [address] [place] [address] [phone]        The results of significant diagnostics from this hospitalization (including imaging, microbiology, ancillary and laboratory)  are listed below for reference.    Significant Diagnostic Studies: US Renal  02/27/2015   CLINICAL DATA:  Sepsis secondary to UTI.  Chronic kidney disease.  EXAM: RENAL/URINARY TRACT ULTRASOUND COMPLETE  COMPARISON:  None.  FINDINGS: Right Kidney:  Length: 11.0  cm. Mild renal cortical thinning. Echogenicity within normal limits. No mass or hydronephrosis visualized.  Left Kidney:  Length: 10.3 cm. Mild renal cortical thinning. Echogenicity within normal limits. No mass or hydronephrosis visualized.  Bladder:  Decompressed with Foley in place.  IMPRESSION: Normal size kidneys with mild bilateral renal cortical thinning.   Electronically Signed   By: Marin Olp M.D.   On: 02/27/2015 12:47   US Venous Img Lower Unilateral Right  03/08/2015   CLINICAL DATA:  Right lower extremity pain and swelling for 10 days. History of fracture right femur approximately 1 month ago. Evaluate for DVT.  EXAM: RIGHT LOWER EXTREMITY VENOUS DOPPLER ULTRASOUND  TECHNIQUE: Gray-scale sonography with graded compression, as well as color Doppler and duplex ultrasound were performed to evaluate the lower extremity deep venous systems from the level of the common femoral vein and including the common femoral, femoral, profunda femoral, popliteal and calf veins including the posterior tibial, peroneal and gastrocnemius veins when visible. The superficial great saphenous vein was also interrogated. Spectral Doppler was utilized to evaluate flow at rest and with distal augmentation maneuvers in the common femoral, femoral and popliteal veins.  COMPARISON:  Right knee radiographs - 01/17/2015; 03/07/2015  FINDINGS: Contralateral Common Femoral Vein: Respiratory phasicity is normal and symmetric with the symptomatic side. No evidence of thrombus. Normal compressibility.  Common Femoral Vein: No evidence of thrombus. Normal compressibility, respiratory phasicity and response to augmentation.  Saphenofemoral Junction: No evidence of thrombus.  Normal compressibility and flow on color Doppler imaging.  Profunda Femoral Vein: No evidence of thrombus. Normal compressibility and flow on color Doppler imaging.  Femoral Vein: No evidence of thrombus. Normal compressibility, respiratory phasicity and response to augmentation.  Popliteal Vein: No evidence of thrombus. Normal compressibility, respiratory phasicity and response to augmentation.  Calf Veins: No evidence of thrombus. Normal compressibility and flow on color Doppler imaging.  Superficial Great Saphenous Vein: No evidence of thrombus. Normal compressibility and flow on color Doppler imaging.  Venous Reflux:  None.  Other Findings:  None.  IMPRESSION: No evidence of DVT within the right lower extremity.   Electronically Signed   By: Sandi Mariscal M.D.   On: 03/08/2015 15:27   Dg Chest Port 1 View  03/01/2015   CLINICAL DATA:  Shortness of breath  EXAM: PORTABLE CHEST - 1 VIEW  COMPARISON:  02/26/2015  FINDINGS: Cardiac shadow is stable. Bibasilar atelectatic changes are again noted. Increased vascular congestion with interstitial edema is now seen consistent with mild CHF. No bony abnormality is noted.  IMPRESSION: New changes of CHF.   Electronically Signed   By: Inez Catalina M.D.   On: 03/01/2015 08:31   Dg Chest Port 1 View  02/26/2015   CLINICAL DATA:  Altered mental status and emesis.  EXAM: PORTABLE CHEST - 1 VIEW  COMPARISON:  02/05/2015  FINDINGS: Chronic elevation of the right hemidiaphragm. Linear densities at lung bases are suggestive for atelectasis. Upper lungs are clear. Heart size is normal. No focal airspace disease.  IMPRESSION: Bibasilar atelectasis.   Electronically Signed   By: Markus Daft M.D.   On: 02/26/2015 17:44   Dg Knee Right Port  03/07/2015   CLINICAL DATA:  Right knee pain and swelling for 2 days. No new injury. Injury in January. IM nail to the right femur down on 01/18/2015. History of diabetes and prostate cancer.  EXAM: PORTABLE RIGHT KNEE - 1-2 VIEW  COMPARISON:   Right knee 01/17/2015.  Fluoroscopy 01/18/2015  FINDINGS: Interval placement of  edge medullary rod with 4 distal locking screws in the distal femur. Healing oblique fracture of the distal femoral metaphysis. Fracture line remains present but callus formation is present consistent with healing. Near-anatomic alignment is demonstrated. There is a tiny displaced butterfly fragment over the lateral screw heads. This appears to been present on the prior study and is likely unchanged. Probable ligamentous calcification over the anterior knee. Vascular calcifications. Old healed fracture deformity of the proximal fibula.  IMPRESSION: Postoperative changes with internal fixation of a healing fracture of the distal femoral metaphysis. Old fracture of the proximal fibula. No definite acute bony abnormality.   Electronically Signed   By: Lucienne Capers M.D.   On: 03/07/2015 19:33   Dg Femur, Min 2 Views Right  02/27/2015   CLINICAL DATA:  Fracture follow-up.  EXAM: RIGHT FEMUR 2 VIEWS  COMPARISON:  01/18/2015 and 01/17/2015  FINDINGS: Mild degenerate change of the right hip. Right femoral intramedullary nail bridges patient's distal femoral fracture as hardware is intact with near anatomic alignment about the fracture site. Mild bridging callus formation is evident. Remainder of the exam is unchanged.  IMPRESSION: Distal right femoral fracture post internal fixation with hardware intact and evidence of mild interval healing.   Electronically Signed   By: Marin Olp M.D.   On: 02/27/2015 11:45    Microbiology: Recent Results (from the past 240 hour(s))  Urine culture     Status: None   Collection Time: 03/15/15 10:38 PM  Result Value Ref Range Status   Specimen Description URINE, CLEAN CATCH  Final   Special Requests NONE  Final   Colony Count   Final    >=100,000 COLONIES/ML Performed at [facility] Performed at [facility]   Final   Report Status 03/18/2015 FINAL   Final  Clostridium Difficile by PCR     Status: None   Collection Time: 03/17/15 11:04 PM  Result Value Ref Range Status   C difficile by pcr NEGATIVE NEGATIVE Final     Labs: Basic Metabolic Panel:  Recent Labs Lab 03/16/15 0644 03/17/15 0642 03/18/15 0527 03/19/15 0514 03/20/15 0519  NA 137 140 140 142 139  K 4.5 4.8 4.9 5.4* 5.8*  CL 103 110 110 112 112  CO2 24 22 23 22 21   GLUCOSE 121* 103* 133* 93 91  BUN 59* 48* 37* 27* 23  CREATININE 3.52* 2.87* 2.37* 2.08* 2.05*  CALCIUM 7.7* 7.7* 7.7* 7.8* 7.7*   Liver Function Tests:  Recent Labs Lab 03/17/15 0642  AST 22  ALT 9  ALKPHOS 71  BILITOT 0.4  PROT 6.8  ALBUMIN 2.5*   No results for input(s): LIPASE, AMYLASE in the last 168 hours. No results for input(s): AMMONIA in the last 168 hours. CBC:  Recent Labs Lab 03/15/15 2124 03/16/15 0644 03/17/15 0642 03/17/15 1557 03/18/15 0527 03/19/15 0514 03/20/15 0519  WBC 8.8 6.1 4.2  --  5.0 5.6 6.3  NEUTROABS 6.3  --   --   --   --   --   --   HGB 9.8* 8.5* 7.8* 8.4* 7.8* 8.1* 7.7*  HCT 31.5* 27.0* 25.1* 27.1* 25.8* 26.0* 25.2*  MCV 90.0 90.0 90.6  --  91.8 91.5 91.6  PLT 233 198 179  --  190 172 180   Cardiac Enzymes: No results for input(s): CKTOTAL, CKMB, CKMBINDEX, TROPONINI in the last 168 hours. BNP: BNP (last 3 results)  Recent Labs  02/26/15 1726  BNP 187.0*  ProBNP (last 3 results) No results for input(s): PROBNP in the last 8760 hours.  CBG:  Recent Labs Lab 03/19/15 1211 03/19/15 1645 03/19/15 2038 03/20/15 0815 03/20/15 1200  GLUCAP 133* 130* 176* 86 118*       Signed:  HERNANDEZ ACOSTA,ESTELA  [name] Pager: [phone] 03/20/2015, 5:33 PM

## 2015-03-20 NOTE — Progress Notes (Signed)
Educated patient and wife at the bedside on how to perform an in and out catheter at home if patient does not void every four to six hours as recommenced. Patient and wife verbalized understanding of how to perform and in and out catheter at home and were able to walk me through the steps. Patient and wife were given a handout with education about in and out catherization at home. Patient will continue to educate patient and wife at this time.

## 2015-03-20 NOTE — Progress Notes (Signed)
[facility] discharged home with wife per MD order.  Discharge instructions reviewed and discussed with the patient and wife, all questions and concerns answered. Copy of instructions and scripts given to patient.    Medication List    STOP taking these medications        metroNIDAZOLE 500 MG tablet  Commonly known as:  FLAGYL     OVER THE COUNTER MEDICATION     torsemide 20 MG tablet  Commonly known as:  DEMADEX     traMADol 50 MG tablet  Commonly known as:  ULTRAM      TAKE these medications        acetaminophen 500 MG tablet  Commonly known as:  TYLENOL  Take 500 mg by mouth every 6 (six) hours as needed for mild pain or moderate pain.     atorvastatin 40 MG tablet  Commonly known as:  LIPITOR  Take 40 mg by mouth daily.     gabapentin 300 MG capsule  Commonly known as:  NEURONTIN  Take 1 capsule (300 mg total) by mouth at bedtime.     insulin aspart 100 UNIT/ML injection  Commonly known as:  novoLOG  - insulin aspart (novoLOG) injection 0-15 Units  - 0-15 Units, Subcutaneous, 3 times daily with meals  - CBG 70 - 120: 0 units  - CBG 121 - 150: 2 units  - CBG 151 - 200: 3 units  - CBG 201 - 250: 5 units  - CBG 251 - 300: 8 units  - CBG 301 - 350: 11 units  - CBG 351 - 400: 15 units  - CBG > 400: call MD     insulin detemir 100 UNIT/ML injection  Commonly known as:  LEVEMIR  Inject 0.1 mLs (10 Units total) into the skin at bedtime.     iron polysaccharides 150 MG capsule  Commonly known as:  NIFEREX  Take 1 capsule (150 mg total) by mouth daily.     ketorolac 0.5 % ophthalmic solution  Commonly known as:  ACULAR  Place 1 drop into both eyes 3 (three) times daily.     metoCLOPramide 5 MG tablet  Commonly known as:  REGLAN  Take 1 tablet (5 mg total) by mouth 4 (four) times daily -  before meals and at bedtime.     metoprolol tartrate 25 MG tablet  Commonly known as:  LOPRESSOR  Take 0.5 tablets (12.5 mg total) by mouth 2 (two) times daily.      oxybutynin 5 MG tablet  Commonly known as:  DITROPAN  Take 1 tablet (5 mg total) by mouth every 8 (eight) hours as needed for bladder spasms.     polyethylene glycol packet  Commonly known as:  MIRALAX / GLYCOLAX  Take 17 g by mouth 2 (two) times daily as needed for moderate constipation. Hold if develops diarrhea.     saccharomyces boulardii 250 MG capsule  Commonly known as:  FLORASTOR  Take 1 capsule (250 mg total) by mouth 2 (two) times daily.     tamsulosin 0.4 MG Caps capsule  Commonly known as:  FLOMAX  Take 1 capsule (0.4 mg total) by mouth daily.       Patient received extra in and out catheter kits.   IV site discontinued and catheter remains intact. Site without signs and symptoms of complications. Dressing and pressure applied.  Patient escorted to car by [place], NT in a wheelchair,  no distress noted upon discharge.  Regino Bellow 03/20/2015  3:46 PM

## 2015-03-21 NOTE — Care Management Utilization Note (Signed)
UR completed 

## 2015-04-10 ENCOUNTER — Encounter (HOSPITAL_COMMUNITY): Payer: Self-pay | Admitting: Emergency Medicine

## 2015-04-10 ENCOUNTER — Inpatient Hospital Stay (HOSPITAL_COMMUNITY)
Admission: EM | Admit: 2015-04-10 | Discharge: 2015-04-19 | DRG: 871 | Disposition: A | Discharge status: Home / Self Care | Payer: Medicare Other | Attending: Internal Medicine | Admitting: Internal Medicine

## 2015-04-10 ENCOUNTER — Emergency Department (HOSPITAL_COMMUNITY): Payer: Medicare Other

## 2015-04-10 ENCOUNTER — Other Ambulatory Visit: Payer: Self-pay

## 2015-04-10 DIAGNOSIS — I120 Hypertensive chronic kidney disease with stage 5 chronic kidney disease or end stage renal disease: Secondary | ICD-10-CM | POA: Diagnosis present

## 2015-04-10 DIAGNOSIS — N12 Tubulo-interstitial nephritis, not specified as acute or chronic: Secondary | ICD-10-CM | POA: Diagnosis present

## 2015-04-10 DIAGNOSIS — Z803 Family history of malignant neoplasm of breast: Secondary | ICD-10-CM | POA: Diagnosis not present

## 2015-04-10 DIAGNOSIS — E876 Hypokalemia: Secondary | ICD-10-CM | POA: Diagnosis not present

## 2015-04-10 DIAGNOSIS — Z923 Personal history of irradiation: Secondary | ICD-10-CM

## 2015-04-10 DIAGNOSIS — E11649 Type 2 diabetes mellitus with hypoglycemia without coma: Secondary | ICD-10-CM | POA: Diagnosis present

## 2015-04-10 DIAGNOSIS — Z66 Do not resuscitate: Secondary | ICD-10-CM | POA: Diagnosis present

## 2015-04-10 DIAGNOSIS — Z794 Long term (current) use of insulin: Secondary | ICD-10-CM

## 2015-04-10 DIAGNOSIS — L89620 Pressure ulcer of left heel, unstageable: Secondary | ICD-10-CM | POA: Diagnosis present

## 2015-04-10 DIAGNOSIS — E785 Hyperlipidemia, unspecified: Secondary | ICD-10-CM | POA: Diagnosis present

## 2015-04-10 DIAGNOSIS — I10 Essential (primary) hypertension: Secondary | ICD-10-CM | POA: Diagnosis present

## 2015-04-10 DIAGNOSIS — R140 Abdominal distension (gaseous): Secondary | ICD-10-CM | POA: Diagnosis not present

## 2015-04-10 DIAGNOSIS — A0472 Enterocolitis due to Clostridium difficile, not specified as recurrent: Secondary | ICD-10-CM | POA: Diagnosis present

## 2015-04-10 DIAGNOSIS — N139 Obstructive and reflux uropathy, unspecified: Secondary | ICD-10-CM | POA: Diagnosis present

## 2015-04-10 DIAGNOSIS — N3941 Urge incontinence: Secondary | ICD-10-CM | POA: Diagnosis present

## 2015-04-10 DIAGNOSIS — Z8249 Family history of ischemic heart disease and other diseases of the circulatory system: Secondary | ICD-10-CM | POA: Diagnosis not present

## 2015-04-10 DIAGNOSIS — R531 Weakness: Secondary | ICD-10-CM | POA: Diagnosis not present

## 2015-04-10 DIAGNOSIS — D638 Anemia in other chronic diseases classified elsewhere: Secondary | ICD-10-CM | POA: Diagnosis present

## 2015-04-10 DIAGNOSIS — R1031 Right lower quadrant pain: Secondary | ICD-10-CM | POA: Diagnosis not present

## 2015-04-10 DIAGNOSIS — E875 Hyperkalemia: Secondary | ICD-10-CM | POA: Diagnosis present

## 2015-04-10 DIAGNOSIS — T83511A Infection and inflammatory reaction due to indwelling urethral catheter, initial encounter: Secondary | ICD-10-CM

## 2015-04-10 DIAGNOSIS — N184 Chronic kidney disease, stage 4 (severe): Secondary | ICD-10-CM

## 2015-04-10 DIAGNOSIS — N185 Chronic kidney disease, stage 5: Secondary | ICD-10-CM | POA: Diagnosis present

## 2015-04-10 DIAGNOSIS — N132 Hydronephrosis with renal and ureteral calculous obstruction: Secondary | ICD-10-CM | POA: Diagnosis present

## 2015-04-10 DIAGNOSIS — K219 Gastro-esophageal reflux disease without esophagitis: Secondary | ICD-10-CM | POA: Diagnosis present

## 2015-04-10 DIAGNOSIS — E1143 Type 2 diabetes mellitus with diabetic autonomic (poly)neuropathy: Secondary | ICD-10-CM | POA: Diagnosis present

## 2015-04-10 DIAGNOSIS — E43 Unspecified severe protein-calorie malnutrition: Secondary | ICD-10-CM | POA: Diagnosis present

## 2015-04-10 DIAGNOSIS — E119 Type 2 diabetes mellitus without complications: Secondary | ICD-10-CM | POA: Diagnosis not present

## 2015-04-10 DIAGNOSIS — N179 Acute kidney failure, unspecified: Secondary | ICD-10-CM | POA: Diagnosis present

## 2015-04-10 DIAGNOSIS — J9601 Acute respiratory failure with hypoxia: Secondary | ICD-10-CM | POA: Diagnosis not present

## 2015-04-10 DIAGNOSIS — E1142 Type 2 diabetes mellitus with diabetic polyneuropathy: Secondary | ICD-10-CM | POA: Diagnosis present

## 2015-04-10 DIAGNOSIS — E872 Acidosis: Secondary | ICD-10-CM | POA: Diagnosis not present

## 2015-04-10 DIAGNOSIS — A419 Sepsis, unspecified organism: Principal | ICD-10-CM | POA: Diagnosis present

## 2015-04-10 DIAGNOSIS — K3184 Gastroparesis: Secondary | ICD-10-CM

## 2015-04-10 DIAGNOSIS — A047 Enterocolitis due to Clostridium difficile: Secondary | ICD-10-CM | POA: Diagnosis present

## 2015-04-10 DIAGNOSIS — C775 Secondary and unspecified malignant neoplasm of intrapelvic lymph nodes: Secondary | ICD-10-CM | POA: Diagnosis present

## 2015-04-10 DIAGNOSIS — Z8546 Personal history of malignant neoplasm of prostate: Secondary | ICD-10-CM | POA: Diagnosis not present

## 2015-04-10 DIAGNOSIS — B3749 Other urogenital candidiasis: Secondary | ICD-10-CM

## 2015-04-10 DIAGNOSIS — M1990 Unspecified osteoarthritis, unspecified site: Secondary | ICD-10-CM | POA: Diagnosis present

## 2015-04-10 DIAGNOSIS — F329 Major depressive disorder, single episode, unspecified: Secondary | ICD-10-CM | POA: Diagnosis present

## 2015-04-10 DIAGNOSIS — R109 Unspecified abdominal pain: Secondary | ICD-10-CM | POA: Diagnosis present

## 2015-04-10 DIAGNOSIS — D509 Iron deficiency anemia, unspecified: Secondary | ICD-10-CM | POA: Diagnosis present

## 2015-04-10 DIAGNOSIS — N189 Chronic kidney disease, unspecified: Secondary | ICD-10-CM

## 2015-04-10 DIAGNOSIS — N390 Urinary tract infection, site not specified: Secondary | ICD-10-CM | POA: Diagnosis present

## 2015-04-10 HISTORY — DX: Gastro-esophageal reflux disease without esophagitis: K21.9

## 2015-04-10 HISTORY — DX: Depression, unspecified: F32.A

## 2015-04-10 HISTORY — DX: Anemia, unspecified: D64.9

## 2015-04-10 HISTORY — DX: Personal history of other diseases of the musculoskeletal system and connective tissue: Z87.39

## 2015-04-10 HISTORY — DX: Type 2 diabetes mellitus without complications: E11.9

## 2015-04-10 HISTORY — DX: Major depressive disorder, single episode, unspecified: F32.9

## 2015-04-10 HISTORY — DX: Unspecified osteoarthritis, unspecified site: M19.90

## 2015-04-10 HISTORY — DX: Pneumonia, unspecified organism: J18.9

## 2015-04-10 HISTORY — DX: Personal history of other medical treatment: Z92.89

## 2015-04-10 HISTORY — DX: Chronic kidney disease, stage 4 (severe): N18.4

## 2015-04-10 LAB — COMPREHENSIVE METABOLIC PANEL
ALT: 24 U/L (ref 0–53)
ANION GAP: 14 (ref 5–15)
AST: 43 U/L — ABNORMAL HIGH (ref 0–37)
Albumin: 2.5 g/dL — ABNORMAL LOW (ref 3.5–5.2)
Alkaline Phosphatase: 110 U/L (ref 39–117)
BUN: 80 mg/dL — ABNORMAL HIGH (ref 6–23)
CO2: 19 mmol/L (ref 19–32)
Calcium: 8.2 mg/dL — ABNORMAL LOW (ref 8.4–10.5)
Chloride: 104 mmol/L (ref 96–112)
Creatinine, Ser: 5.59 mg/dL — ABNORMAL HIGH (ref 0.50–1.35)
GFR calc non Af Amer: 9 mL/min — ABNORMAL LOW (ref >90)
GFR, EST AFRICAN AMERICAN: 10 mL/min — AB (ref >90)
Glucose, Bld: 111 mg/dL — ABNORMAL HIGH (ref 70–99)
POTASSIUM: 5.6 mmol/L — AB (ref 3.5–5.1)
SODIUM: 137 mmol/L (ref 135–145)
TOTAL PROTEIN: 8.4 g/dL — AB (ref 6.0–8.3)
Total Bilirubin: 0.5 mg/dL (ref 0.3–1.2)

## 2015-04-10 LAB — CBC WITH DIFFERENTIAL/PLATELET
BASOS ABS: 0 10*3/uL (ref 0.0–0.1)
BASOS PCT: 0 % (ref 0–1)
Eosinophils Absolute: 0.1 10*3/uL (ref 0.0–0.7)
Eosinophils Relative: 1 % (ref 0–5)
HCT: 27.6 % — ABNORMAL LOW (ref 39.0–52.0)
HEMOGLOBIN: 8.6 g/dL — AB (ref 13.0–17.0)
Lymphocytes Relative: 7 % — ABNORMAL LOW (ref 12–46)
Lymphs Abs: 1.4 10*3/uL (ref 0.7–4.0)
MCH: 27.5 pg (ref 26.0–34.0)
MCHC: 31.2 g/dL (ref 30.0–36.0)
MCV: 88.2 fL (ref 78.0–100.0)
Monocytes Absolute: 1.5 10*3/uL — ABNORMAL HIGH (ref 0.1–1.0)
Monocytes Relative: 7 % (ref 3–12)
Neutro Abs: 17.1 10*3/uL — ABNORMAL HIGH (ref 1.7–7.7)
Neutrophils Relative %: 85 % — ABNORMAL HIGH (ref 43–77)
PLATELETS: 213 10*3/uL (ref 150–400)
RBC: 3.13 MIL/uL — AB (ref 4.22–5.81)
RDW: 15.5 % (ref 11.5–15.5)
WBC: 20.1 10*3/uL — ABNORMAL HIGH (ref 4.0–10.5)

## 2015-04-10 LAB — PROCALCITONIN: Procalcitonin: 3.87 ng/mL

## 2015-04-10 LAB — PROTIME-INR
INR: 1.61 — ABNORMAL HIGH (ref 0.00–1.49)
Prothrombin Time: 19.3 seconds — ABNORMAL HIGH (ref 11.6–15.2)

## 2015-04-10 LAB — I-STAT CHEM 8, ED
BUN: 88 mg/dL — ABNORMAL HIGH (ref 6–23)
CHLORIDE: 106 mmol/L (ref 96–112)
Calcium, Ion: 1.07 mmol/L — ABNORMAL LOW (ref 1.13–1.30)
Creatinine, Ser: 5.4 mg/dL — ABNORMAL HIGH (ref 0.50–1.35)
GLUCOSE: 106 mg/dL — AB (ref 70–99)
HCT: 28 % — ABNORMAL LOW (ref 39.0–52.0)
Hemoglobin: 9.5 g/dL — ABNORMAL LOW (ref 13.0–17.0)
Potassium: 5.7 mmol/L — ABNORMAL HIGH (ref 3.5–5.1)
Sodium: 138 mmol/L (ref 135–145)
TCO2: 17 mmol/L (ref 0–100)

## 2015-04-10 LAB — URINALYSIS, ROUTINE W REFLEX MICROSCOPIC
Bilirubin Urine: NEGATIVE
GLUCOSE, UA: NEGATIVE mg/dL
KETONES UR: NEGATIVE mg/dL
Nitrite: NEGATIVE
Protein, ur: >300 mg/dL — AB
Specific Gravity, Urine: 1.016 (ref 1.005–1.030)
Urobilinogen, UA: 0.2 mg/dL (ref 0.0–1.0)
pH: 5.5 (ref 5.0–8.0)

## 2015-04-10 LAB — APTT: aPTT: 38 seconds — ABNORMAL HIGH (ref 24–37)

## 2015-04-10 LAB — GLUCOSE, CAPILLARY: Glucose-Capillary: 118 mg/dL — ABNORMAL HIGH (ref 70–99)

## 2015-04-10 LAB — URINE MICROSCOPIC-ADD ON

## 2015-04-10 LAB — LACTIC ACID, PLASMA: Lactic Acid, Venous: 1.6 mmol/L (ref 0.5–2.0)

## 2015-04-10 MED ORDER — METOPROLOL TARTRATE 12.5 MG HALF TABLET
12.5000 mg | ORAL_TABLET | Freq: Two times a day (BID) | ORAL | Status: DC | PRN
  Filled 2015-04-10: qty 1

## 2015-04-10 MED ORDER — INSULIN DETEMIR 100 UNIT/ML ~~LOC~~ SOLN
7.0000 [IU] | Freq: Every day | SUBCUTANEOUS | Status: DC
  Administered 2015-04-10: 7 [IU] via SUBCUTANEOUS
  Filled 2015-04-10 (×2): qty 0.07

## 2015-04-10 MED ORDER — POLYETHYLENE GLYCOL 3350 17 G PO PACK
17.0000 g | PACK | Freq: Every day | ORAL | Status: DC | PRN
  Filled 2015-04-10: qty 1

## 2015-04-10 MED ORDER — ONDANSETRON HCL 4 MG PO TABS: 4.0000 mg | ORAL_TABLET | Freq: Four times a day (QID) | ORAL | Status: DC | PRN

## 2015-04-10 MED ORDER — SODIUM CHLORIDE 0.9 % IV BOLUS (SEPSIS)
500.0000 mL | Freq: Once | INTRAVENOUS | Status: AC
  Administered 2015-04-10: 500 mL via INTRAVENOUS

## 2015-04-10 MED ORDER — INSULIN ASPART 100 UNIT/ML ~~LOC~~ SOLN
0.0000 [IU] | Freq: Three times a day (TID) | SUBCUTANEOUS | Status: DC
  Administered 2015-04-11: 3 [IU] via SUBCUTANEOUS
  Administered 2015-04-12 – 2015-04-13 (×4): 2 [IU] via SUBCUTANEOUS
  Administered 2015-04-14 – 2015-04-16 (×5): 1 [IU] via SUBCUTANEOUS
  Administered 2015-04-17: 2 [IU] via SUBCUTANEOUS
  Administered 2015-04-17 – 2015-04-19 (×4): 1 [IU] via SUBCUTANEOUS

## 2015-04-10 MED ORDER — SODIUM POLYSTYRENE SULFONATE 15 GM/60ML PO SUSP
ORAL | Status: AC
  Filled 2015-04-10: qty 60

## 2015-04-10 MED ORDER — ONDANSETRON HCL 4 MG/2ML IJ SOLN
4.0000 mg | Freq: Four times a day (QID) | INTRAMUSCULAR | Status: DC | PRN
  Administered 2015-04-13 – 2015-04-18 (×2): 4 mg via INTRAVENOUS
  Filled 2015-04-10 (×2): qty 2

## 2015-04-10 MED ORDER — SACCHAROMYCES BOULARDII 250 MG PO CAPS
250.0000 mg | ORAL_CAPSULE | Freq: Two times a day (BID) | ORAL | Status: DC
Start: 2015-04-10 — End: 2015-04-14
  Administered 2015-04-11 – 2015-04-13 (×5): 250 mg via ORAL
  Filled 2015-04-10 (×9): qty 1

## 2015-04-10 MED ORDER — METOCLOPRAMIDE HCL 5 MG PO TABS
5.0000 mg | ORAL_TABLET | Freq: Three times a day (TID) | ORAL | Status: DC
  Administered 2015-04-11 – 2015-04-19 (×32): 5 mg via ORAL
  Filled 2015-04-10 (×40): qty 1

## 2015-04-10 MED ORDER — GABAPENTIN 300 MG PO CAPS
300.0000 mg | ORAL_CAPSULE | Freq: Every day | ORAL | Status: DC
  Administered 2015-04-11 – 2015-04-18 (×8): 300 mg via ORAL
  Filled 2015-04-10 (×10): qty 1

## 2015-04-10 MED ORDER — SODIUM CHLORIDE 0.9 % IV SOLN: INTRAVENOUS | Status: AC

## 2015-04-10 MED ORDER — KETOROLAC TROMETHAMINE 0.5 % OP SOLN
1.0000 [drp] | Freq: Three times a day (TID) | OPHTHALMIC | Status: DC
  Administered 2015-04-11 – 2015-04-19 (×26): 1 [drp] via OPHTHALMIC
  Filled 2015-04-10: qty 3

## 2015-04-10 MED ORDER — ATORVASTATIN CALCIUM 40 MG PO TABS
40.0000 mg | ORAL_TABLET | Freq: Every day | ORAL | Status: DC
  Administered 2015-04-11 – 2015-04-19 (×8): 40 mg via ORAL
  Filled 2015-04-10 (×10): qty 1

## 2015-04-10 MED ORDER — CEFTRIAXONE SODIUM IN DEXTROSE 20 MG/ML IV SOLN
1.0000 g | INTRAVENOUS | Status: DC
  Administered 2015-04-10 – 2015-04-13 (×4): 1 g via INTRAVENOUS
  Filled 2015-04-10 (×5): qty 50

## 2015-04-10 MED ORDER — HEPARIN SODIUM (PORCINE) 5000 UNIT/ML IJ SOLN
5000.0000 [IU] | Freq: Three times a day (TID) | INTRAMUSCULAR | Status: DC
  Administered 2015-04-10 – 2015-04-16 (×15): 5000 [IU] via SUBCUTANEOUS
  Filled 2015-04-10 (×20): qty 1

## 2015-04-10 MED ORDER — SODIUM CHLORIDE 0.9 % IV SOLN
INTRAVENOUS | Status: DC
  Administered 2015-04-10 – 2015-04-11 (×2): via INTRAVENOUS

## 2015-04-10 MED ORDER — OXYBUTYNIN CHLORIDE 5 MG PO TABS
5.0000 mg | ORAL_TABLET | Freq: Three times a day (TID) | ORAL | Status: DC | PRN
  Filled 2015-04-10: qty 1

## 2015-04-10 MED ORDER — POLYSACCHARIDE IRON COMPLEX 150 MG PO CAPS
150.0000 mg | ORAL_CAPSULE | Freq: Every day | ORAL | Status: DC
  Administered 2015-04-13 – 2015-04-19 (×7): 150 mg via ORAL
  Filled 2015-04-10 (×10): qty 1

## 2015-04-10 MED ORDER — TAMSULOSIN HCL 0.4 MG PO CAPS
0.4000 mg | ORAL_CAPSULE | Freq: Every day | ORAL | Status: DC
  Administered 2015-04-11 – 2015-04-19 (×8): 0.4 mg via ORAL
  Filled 2015-04-10 (×10): qty 1

## 2015-04-10 MED ORDER — ENSURE PUDDING PO PUDG
1.0000 | Freq: Three times a day (TID) | ORAL | Status: DC
  Administered 2015-04-13 – 2015-04-16 (×6): 1 via ORAL

## 2015-04-10 MED ORDER — MORPHINE SULFATE 2 MG/ML IJ SOLN: 1.0000 mg | INTRAMUSCULAR | Status: DC | PRN

## 2015-04-10 MED ORDER — ACETAMINOPHEN 500 MG PO TABS
500.0000 mg | ORAL_TABLET | Freq: Four times a day (QID) | ORAL | Status: DC | PRN
  Administered 2015-04-11: 500 mg via ORAL
  Filled 2015-04-10: qty 1

## 2015-04-10 MED ORDER — SODIUM CHLORIDE 0.9 % IV BOLUS (SEPSIS)
1000.0000 mL | Freq: Once | INTRAVENOUS | Status: AC
  Administered 2015-04-10: 1000 mL via INTRAVENOUS

## 2015-04-10 MED ORDER — SODIUM POLYSTYRENE SULFONATE 15 GM/60ML PO SUSP
30.0000 g | Freq: Once | ORAL | Status: AC
  Administered 2015-04-10: 30 g via ORAL
  Filled 2015-04-10: qty 120

## 2015-04-10 NOTE — ED Provider Notes (Signed)
CSN: [id number]     Arrival date & time 04/10/15  77 History   First MD Initiated Contact with Patient 04/10/15 1559     Chief Complaint  Patient presents with  . Abnormal Lab     (Consider location/radiation/quality/duration/timing/severity/associated sxs/prior Treatment) The history is provided by the patient.   patient was reportedly sent in for elevated potassium. His had recent renal insufficiency and states that he is almost at the level of dialysis. Patient's wife states that he was told by the primary care doctor that the potassium was high and it was damaging his kidneys and if they didn't get treated it would damage his heart. Patient states he feels little weak. No fevers he has had decreased oral intake. He has had some vomiting. Decreased appetite. Patient no longer has a Foley catheter. They have not been doing the intermittent caths either, wife states that they have a needed because he's been able to urinate on his own.  Past Medical History  Diagnosis Date  . Hypertension   . Hyperlipidemia   . Hyperkalemia   . UTI (lower urinary tract infection)   . Sensorineural hearing loss, unspecified   . Epiglottitis   . Chronic ankle pain   . Neuromuscular disorder     neurophaty lower legs  . Upper GI bleeding     while at [facility] 12/2014  . Confusion   . Prostate cancer 2009    radiation  . Type II diabetes mellitus   . Gastroparesis due to DM: DELAYED GASTRIC EMPTYING STUDY 02/08/15 02/08/2015  . Pneumonia 12/2014; 02/2015  . History of blood transfusion X 3    "related to low HgB"  . Anemia   . GERD (gastroesophageal reflux disease)   . Arthritis     "knees" (04/10/2015)  . History of gout   . Depression     "since he broke his leg in 12/2014" (04/10/2015)  . Calculus of kidney   . Chronic kidney disease (CKD), stage IV (severe)     "he's on the verge of dialysis" (04/10/2015)   Past Surgical History  Procedure Laterality Date  . Eye surgery  sept. 2015    cataract  surgery  . Femur im nail Right 01/18/2015    Procedure: INTRAMEDULLARY (IM) RETROGRADE FEMORAL NAILING;  Surgeon: Renette Butters, MD;  Location: [place];  Service: Orthopedics;  Laterality: Right;  . Fracture surgery    . Appendectomy    . Inguinal hernia repair  1970's?  . Cataract extraction w/ intraocular lens  implant, bilateral Bilateral    Family History  Problem Relation Age of Onset  . Breast cancer Mother   . Hypertension Father   . Heart attack Father   . Heart attack Father   . Thyroid disease Sister    History  Substance Use Topics  . Smoking status: Former Smoker -- 1 years    Types: Cigarettes  . Smokeless tobacco: Former Systems developer    Types: Chew     Comment: "smoked some when he was 16-17; chewed as a young teenager"  . Alcohol Use: No    Review of Systems  Constitutional: Positive for appetite change and fatigue. Negative for chills.  Respiratory: Negative for shortness of breath.   Cardiovascular: Negative for chest pain.  Gastrointestinal: Negative for abdominal pain.  Genitourinary: Negative for flank pain.  Musculoskeletal: Negative for back pain.  Skin: Negative for wound.  Neurological: Positive for tremors. Negative for headaches.  Psychiatric/Behavioral: Negative for confusion.  Allergies  Review of patient's allergies indicates no known allergies.  Home Medications   Prior to Admission medications   Medication Sig Start Date End Date Taking? Authorizing Provider  acetaminophen (TYLENOL) 500 MG tablet Take 500 mg by mouth every 6 (six) hours as needed for mild pain or moderate pain.   Yes Historical Provider, MD  atorvastatin (LIPITOR) 40 MG tablet Take 40 mg by mouth daily.   Yes Historical Provider, MD  gabapentin (NEURONTIN) 300 MG capsule Take 1 capsule (300 mg total) by mouth at bedtime. 03/09/15  Yes Kathie Dike, MD  insulin aspart (NOVOLOG) 100 UNIT/ML injection insulin aspart (novoLOG) injection 0-15 Units 0-15 Units, Subcutaneous, 3  times daily with meals CBG 70 - 120: 0 units CBG 121 - 150: 2 units CBG 151 - 200: 3 units CBG 201 - 250: 5 units CBG 251 - 300: 8 units CBG 301 - 350: 11 units CBG 351 - 400: 15 units CBG > 400: call MD Patient taking differently: Inject 0-15 Units into the skin 3 (three) times daily with meals. insulin aspart (novoLOG) injection 0-15 Units 0-15 Units, Subcutaneous, 3 times daily with meals CBG 70 - 120: 0 units CBG 121 - 150: 2 units CBG 151 - 200: 3 units CBG 201 - 250: 5 units CBG 251 - 300: 8 units CBG 301 - 350: 11 units CBG 351 - 400: 15 units CBG > 400: call MD 01/21/15  Yes Shanker Kristeen Mans, MD  insulin detemir (LEVEMIR) 100 UNIT/ML injection Inject 0.1 mLs (10 Units total) into the skin at bedtime. 02/09/15  Yes Eugenie Filler, MD  ketorolac (ACULAR) 0.5 % ophthalmic solution Place 1 drop into both eyes 3 (three) times daily.   Yes Historical Provider, MD  metoCLOPramide (REGLAN) 5 MG tablet Take 1 tablet (5 mg total) by mouth 4 (four) times daily -  before meals and at bedtime. 02/09/15  Yes Eugenie Filler, MD  metoprolol tartrate (LOPRESSOR) 25 MG tablet Take 0.5 tablets (12.5 mg total) by mouth 2 (two) times daily. Patient taking differently: Take 12.5 mg by mouth 2 (two) times daily as needed.  03/20/15  Yes Estela Leonie Green, MD  oxybutynin (DITROPAN) 5 MG tablet Take 1 tablet (5 mg total) by mouth every 8 (eight) hours as needed for bladder spasms. 02/09/15  Yes Eugenie Filler, MD  saccharomyces boulardii (FLORASTOR) 250 MG capsule Take 1 capsule (250 mg total) by mouth 2 (two) times daily. 03/09/15  Yes Kathie Dike, MD  tamsulosin (FLOMAX) 0.4 MG CAPS capsule Take 1 capsule (0.4 mg total) by mouth daily. 02/09/15  Yes Eugenie Filler, MD  iron polysaccharides (NIFEREX) 150 MG capsule Take 1 capsule (150 mg total) by mouth daily. Patient not taking: Reported on 03/15/2015 03/09/15   Kathie Dike, MD  polyethylene glycol (MIRALAX / GLYCOLAX) packet Take 17  g by mouth 2 (two) times daily as needed for moderate constipation. Hold if develops diarrhea. Patient not taking: Reported on 03/15/2015 03/09/15   Kathie Dike, MD   BP 135/67 mmHg  Pulse 106  Temp(Src) 98 F (36.7 C) (Oral)  Resp 18  Ht 5\' 5"  (1.651 m)  Wt 123 lb 7.3 oz (56 kg)  BMI 20.54 kg/m2  SpO2 98% Physical Exam  Constitutional: He appears well-developed.  HENT:  Head: Normocephalic.  Neck: Neck supple.  Cardiovascular: Normal rate and regular rhythm.   Pulmonary/Chest: Effort normal.  Abdominal: Soft.  Musculoskeletal: Normal range of motion.  Neurological: He is alert.  Skin:  Skin is warm.  Psychiatric: He has a normal mood and affect.    ED Course  Procedures (including critical care time) Labs Review Labs Reviewed  MRSA PCR SCREENING - Abnormal; Notable for the following:    MRSA by PCR POSITIVE (*)    All other components within normal limits  CBC WITH DIFFERENTIAL/PLATELET - Abnormal; Notable for the following:    WBC 20.1 (*)    RBC 3.13 (*)    Hemoglobin 8.6 (*)    HCT 27.6 (*)    Neutrophils Relative % 85 (*)    Neutro Abs 17.1 (*)    Lymphocytes Relative 7 (*)    Monocytes Absolute 1.5 (*)    All other components within normal limits  COMPREHENSIVE METABOLIC PANEL - Abnormal; Notable for the following:    Potassium 5.6 (*)    Glucose, Bld 111 (*)    BUN 80 (*)    Creatinine, Ser 5.59 (*)    Calcium 8.2 (*)    Total Protein 8.4 (*)    Albumin 2.5 (*)    AST 43 (*)    GFR calc non Af Amer 9 (*)    GFR calc Af Amer 10 (*)    All other components within normal limits  URINALYSIS, ROUTINE W REFLEX MICROSCOPIC - Abnormal; Notable for the following:    APPearance TURBID (*)    Hgb urine dipstick LARGE (*)    Protein, ur >300 (*)    Leukocytes, UA LARGE (*)    All other components within normal limits  URINE MICROSCOPIC-ADD ON - Abnormal; Notable for the following:    Bacteria, UA FEW (*)    Casts HYALINE CASTS (*)    All other components  within normal limits  PROTIME-INR - Abnormal; Notable for the following:    Prothrombin Time 19.3 (*)    INR 1.61 (*)    All other components within normal limits  APTT - Abnormal; Notable for the following:    aPTT 38 (*)    All other components within normal limits  COMPREHENSIVE METABOLIC PANEL - Abnormal; Notable for the following:    CO2 15 (*)    Glucose, Bld 63 (*)    BUN 74 (*)    Creatinine, Ser 5.30 (*)    Calcium 7.5 (*)    Albumin 2.2 (*)    AST 59 (*)    Alkaline Phosphatase 128 (*)    Total Bilirubin 0.2 (*)    GFR calc non Af Amer 9 (*)    GFR calc Af Amer 11 (*)    All other components within normal limits  CBC - Abnormal; Notable for the following:    WBC 16.1 (*)    RBC 3.03 (*)    Hemoglobin 8.5 (*)    HCT 26.7 (*)    RDW 15.7 (*)    All other components within normal limits  GLUCOSE, CAPILLARY - Abnormal; Notable for the following:    Glucose-Capillary 118 (*)    All other components within normal limits  GLUCOSE, CAPILLARY - Abnormal; Notable for the following:    Glucose-Capillary 59 (*)    All other components within normal limits  GLUCOSE, CAPILLARY - Abnormal; Notable for the following:    Glucose-Capillary 119 (*)    All other components within normal limits  GLUCOSE, CAPILLARY - Abnormal; Notable for the following:    Glucose-Capillary 102 (*)    All other components within normal limits  GLUCOSE, CAPILLARY - Abnormal; Notable for the following:  Glucose-Capillary 205 (*)    All other components within normal limits  CBC - Abnormal; Notable for the following:    RBC 2.58 (*)    Hemoglobin 7.1 (*)    HCT 22.3 (*)    RDW 15.8 (*)    All other components within normal limits  BASIC METABOLIC PANEL - Abnormal; Notable for the following:    Potassium 3.0 (*)    Glucose, Bld 165 (*)    BUN 70 (*)    Creatinine, Ser 4.99 (*)    Calcium 6.8 (*)    GFR calc non Af Amer 10 (*)    GFR calc Af Amer 11 (*)    All other components within normal  limits  GLUCOSE, CAPILLARY - Abnormal; Notable for the following:    Glucose-Capillary 120 (*)    All other components within normal limits  GLUCOSE, CAPILLARY - Abnormal; Notable for the following:    Glucose-Capillary 178 (*)    All other components within normal limits  BASIC METABOLIC PANEL - Abnormal; Notable for the following:    Potassium 3.2 (*)    Glucose, Bld 217 (*)    BUN 64 (*)    Creatinine, Ser 4.64 (*)    Calcium 6.4 (*)    GFR calc non Af Amer 11 (*)    GFR calc Af Amer 12 (*)    All other components within normal limits  GLUCOSE, CAPILLARY - Abnormal; Notable for the following:    Glucose-Capillary 146 (*)    All other components within normal limits  GLUCOSE, CAPILLARY - Abnormal; Notable for the following:    Glucose-Capillary 161 (*)    All other components within normal limits  CBC - Abnormal; Notable for the following:    RBC 2.94 (*)    Hemoglobin 8.1 (*)    HCT 25.3 (*)    RDW 15.8 (*)    Platelets 145 (*)    All other components within normal limits  BASIC METABOLIC PANEL - Abnormal; Notable for the following:    Potassium 3.2 (*)    Glucose, Bld 198 (*)    BUN 62 (*)    Creatinine, Ser 4.44 (*)    Calcium 6.4 (*)    GFR calc non Af Amer 11 (*)    GFR calc Af Amer 13 (*)    All other components within normal limits  GLUCOSE, CAPILLARY - Abnormal; Notable for the following:    Glucose-Capillary 190 (*)    All other components within normal limits  GLUCOSE, CAPILLARY - Abnormal; Notable for the following:    Glucose-Capillary 160 (*)    All other components within normal limits  GLUCOSE, CAPILLARY - Abnormal; Notable for the following:    Glucose-Capillary 193 (*)    All other components within normal limits  I-STAT CHEM 8, ED - Abnormal; Notable for the following:    Potassium 5.7 (*)    BUN 88 (*)    Creatinine, Ser 5.40 (*)    Glucose, Bld 106 (*)    Calcium, Ion 1.07 (*)    Hemoglobin 9.5 (*)    HCT 28.0 (*)    All other components  within normal limits  URINE CULTURE  CULTURE, BLOOD (ROUTINE X 2)  CULTURE, BLOOD (ROUTINE X 2)  ANAEROBIC CULTURE  ANAEROBIC CULTURE  URINE CULTURE  LACTIC ACID, PLASMA  LACTIC ACID, PLASMA  PROCALCITONIN  PREPARE RBC (CROSSMATCH)  TYPE AND SCREEN    Imaging Review No results found.   EKG Interpretation  Date/Time:  Wednesday April 10 2015 16:07:03 EDT Ventricular Rate:  95 PR Interval:  142 QRS Duration: 72 QT Interval:  340 QTC Calculation: 427 R Axis:   23 Text Interpretation:  Undetermined rhythm Otherwise normal ECG Confirmed  by Alvino Chapel  MD, Ovid Curd [phone]) on 04/10/2015 4:11:26 PM      MDM   Final diagnoses:  Acute renal failure superimposed on stage 5 chronic kidney disease  Hyperkalemia  Urinary tract infection associated with catheterization of urinary tract, initial encounter    Patient sent in for worsening renal failure. Does have some hyperkalemia without EKG changes. Also found to have urinary tract infection. Does not appear to be severely septic at this time. Will admit to internal medicine.    Davonna Belling, MD 04/13/15 1255

## 2015-04-10 NOTE — ED Notes (Signed)
1 bottole of Kayexalate spilled over floor will repull

## 2015-04-10 NOTE — ED Notes (Signed)
Patient given 2 dose of Kayexalate

## 2015-04-10 NOTE — ED Notes (Signed)
Patient is on [place] care Potassium High does not know value. Patient has a 20 L AC  No complaints of pain. Alert & O on arivial.

## 2015-04-10 NOTE — Progress Notes (Signed)
New Admission Note:   Arrival: from ED on bed Mental Orientation: A&Ox4 Telemetry: placed on box 6e06, ST in 120's-MD aware Assessment:  See doc flowsheet Skin: stage II on sacrum, scratch on upper back, scrotum red, bottom excoriated, skin dry and flaky, bruises on arms bilaterally, left heel wound wrapped in gauze dressing (dressing changes done by home health RN per wife), right heel callus red IV: left AC IV Pain: none Safety Measures:  Call bell placed within reach; patient instructed on use of call bell and verbalized understanding. Bed in lowest position.  Bed alarm on. [address] Orientation: Patient oriented to staff, room, and unit. Family:  Family at bedside  Orders have been reviewed and implemented. Patient had one incontinent episode of stool upon arrival from ED.  Will continue to monitor.  Arlyss Queen, RN, BSN

## 2015-04-10 NOTE — ED Notes (Signed)
Assisted Carmelina Paddock, RN with repositioning pt on stretcher; visitors at bedside

## 2015-04-10 NOTE — H&P (Addendum)
[name] History and Physical  Douglas [name] TDD:[id number] DOB: 09-Aug-1931 DOA: 04/10/2015  Referring physician: ED physician PCP: [name]., MD  Specialists:   Chief Complaint: Generalized weakness, abdominal pain  HPI: Douglas [name] is a 81 y.o. male with past medical history for hypertension, hyperlipidemia, diabetes mellitus, anemia, peripheral neuropathy, upper GI bleeding, prostate cancer 2009 (post status of radiation therapy), gastroparesis, chronic kidney disease-IV-V, history of C. difficile colitis (completed treatment, repeated C. difficile PCR negative on 03/17/15), who presents with generalized weakness and abdominal pain.  Patient was recently hospitalized from 3/18 to 3/23 due to sepsis secondary to UTI. He was treated successfully, and discharged home at stable condition. Patient reports that after he went home, his condition has never come back to his baseline. He still feel so weak that she could not walk normally. No unilateral weakness. He also has mild abdominal pain over right lower quadrant. No injury. Patient does not have diarrhea, nausea or vomiting. He does not have dysuria or burning on urination. He is using diaphragm, and therefore cannot tell whether he has an increased urinary frequency or not. He had fever with temperature 100 at one time yesterday. Patient was evaluated by his PCP yesterday, and had some lab done. He was called and advised by PCP to come to the hospital because his potassium is elevated today.  Currently, patient denies fever, chills, cough, chest pain, SOB, diarrhea, constipation, dysuria, urgency, frequency, hematuria, skin rashes, joint pain or leg swelling. No unilateral weakness, numbness or tingling sensations. No vision change or hearing loss.  In ED, patient was found to have potassium of 5.6 without T wave peaking on EKG, AoCKD-IV-V, creatinine up from baseline 2.0-3.0 to 5.59, WBC 20.1, tachycardia, temperature 98.1,  urinalysis is positive for UTI. Chest x-ray is negative for acute abnormalities.  Review of Systems: As presented in the history of presenting illness, rest negative.  Where does patient live?  At home Can patient participate in ADLs? barely  Allergy: No Known Allergies  Past Medical History  Diagnosis Date  . Hypertension   . Diabetes mellitus   . Hyperlipidemia   . Cataracts, bilateral   . Hyperkalemia   . UTI (lower urinary tract infection)   . Calculus of kidney   . Sensorineural hearing loss, unspecified   . Epiglottitis   . Chronic ankle pain   . Neuromuscular disorder     neurophaty lower legs  . Upper GI bleeding     while at [facility] 12/2014  . Prostate cancer 2009    radiation  . Confusion   . Gastroparesis due to DM: DELAYED GASTRIC EMPTYING STUDY 02/08/15 02/08/2015    Past Surgical History  Procedure Laterality Date  . Eye surgery  sept. 2015    cataract surgery  . Hernia repair      severa; years can't remeber  . Femur im nail Right 01/18/2015    Procedure: INTRAMEDULLARY (IM) RETROGRADE FEMORAL NAILING;  Surgeon: Renette Butters, MD;  Location: [place];  Service: Orthopedics;  Laterality: Right;    Social History:  reports that he has never smoked. He does not have any smokeless tobacco history on file. He reports that he does not drink alcohol or use illicit drugs.  Family History:  Family History  Problem Relation Age of Onset  . Breast cancer Mother   . Hypertension Father   . Heart attack Father   . Heart attack Father   . Thyroid disease Sister  Prior to Admission medications   Medication Sig Start Date End Date Taking? Authorizing Provider  acetaminophen (TYLENOL) 500 MG tablet Take 500 mg by mouth every 6 (six) hours as needed for mild pain or moderate pain.   Yes Historical Provider, MD  atorvastatin (LIPITOR) 40 MG tablet Take 40 mg by mouth daily.   Yes Historical Provider, MD  gabapentin (NEURONTIN) 300 MG capsule Take 1 capsule (300 mg  total) by mouth at bedtime. 03/09/15  Yes Kathie Dike, MD  insulin aspart (NOVOLOG) 100 UNIT/ML injection insulin aspart (novoLOG) injection 0-15 Units 0-15 Units, Subcutaneous, 3 times daily with meals CBG 70 - 120: 0 units CBG 121 - 150: 2 units CBG 151 - 200: 3 units CBG 201 - 250: 5 units CBG 251 - 300: 8 units CBG 301 - 350: 11 units CBG 351 - 400: 15 units CBG > 400: call MD Patient taking differently: Inject 0-15 Units into the skin 3 (three) times daily with meals. insulin aspart (novoLOG) injection 0-15 Units 0-15 Units, Subcutaneous, 3 times daily with meals CBG 70 - 120: 0 units CBG 121 - 150: 2 units CBG 151 - 200: 3 units CBG 201 - 250: 5 units CBG 251 - 300: 8 units CBG 301 - 350: 11 units CBG 351 - 400: 15 units CBG > 400: call MD 01/21/15  Yes Shanker Kristeen Mans, MD  insulin detemir (LEVEMIR) 100 UNIT/ML injection Inject 0.1 mLs (10 Units total) into the skin at bedtime. 02/09/15  Yes Eugenie Filler, MD  ketorolac (ACULAR) 0.5 % ophthalmic solution Place 1 drop into both eyes 3 (three) times daily.   Yes Historical Provider, MD  metoCLOPramide (REGLAN) 5 MG tablet Take 1 tablet (5 mg total) by mouth 4 (four) times daily -  before meals and at bedtime. 02/09/15  Yes Eugenie Filler, MD  metoprolol tartrate (LOPRESSOR) 25 MG tablet Take 0.5 tablets (12.5 mg total) by mouth 2 (two) times daily. Patient taking differently: Take 12.5 mg by mouth 2 (two) times daily as needed.  03/20/15  Yes Estela Leonie Green, MD  oxybutynin (DITROPAN) 5 MG tablet Take 1 tablet (5 mg total) by mouth every 8 (eight) hours as needed for bladder spasms. 02/09/15  Yes Eugenie Filler, MD  saccharomyces boulardii (FLORASTOR) 250 MG capsule Take 1 capsule (250 mg total) by mouth 2 (two) times daily. 03/09/15  Yes Kathie Dike, MD  tamsulosin (FLOMAX) 0.4 MG CAPS capsule Take 1 capsule (0.4 mg total) by mouth daily. 02/09/15  Yes Eugenie Filler, MD  iron polysaccharides (NIFEREX) 150 MG  capsule Take 1 capsule (150 mg total) by mouth daily. Patient not taking: Reported on 03/15/2015 03/09/15   Kathie Dike, MD  polyethylene glycol (MIRALAX / GLYCOLAX) packet Take 17 g by mouth 2 (two) times daily as needed for moderate constipation. Hold if develops diarrhea. Patient not taking: Reported on 03/15/2015 03/09/15   Kathie Dike, MD    Physical Exam: Filed Vitals:   04/10/15 1745 04/10/15 1800 04/10/15 1815 04/10/15 1915  BP: 137/53 134/106 129/61 115/49  Pulse: 111 112 108 116  Temp:      TempSrc:      Resp: 17 19 21 19   SpO2: 100% 98% 98% 100%   General: Not in acute distress, cachectic, pale looking, very tired HEENT:       Eyes: PERRL, EOMI, no scleral icterus       ENT: No discharge from the ears and nose, no pharynx injection, no tonsillar enlargement.  Neck: No JVD, no bruit, no mass felt. Cardiac: S1/S2, RRR, No murmurs, No gallops or rubs Pulm: Good air movement bilaterally. Clear to auscultation bilaterally. No rales, wheezing, rhonchi or rubs. Abd: Soft, nondistended, mild tenderness over RLQ, no rebound pain, no organomegaly, BS present Ext: No edema bilaterally. 2+DP/PT pulse bilaterally Musculoskeletal: No joint deformities, erythema, or stiffness, ROM full Skin: No rashes.  Neuro: Alert and oriented X3, cranial nerves II-XII grossly intact, muscle strength 5/5 in all extremeties, sensation to light touch intact. Brachial reflex 2+ bilaterally. Knee reflex 1+ bilaterally.  Psych: Patient is not psychotic, no suicidal or hemocidal ideation.  Labs on Admission:  Basic Metabolic Panel:  Recent Labs Lab 04/10/15 1712 04/10/15 1715  NA 138 137  K 5.7* 5.6*  CL 106 104  CO2  --  19  GLUCOSE 106* 111*  BUN 88* 80*  CREATININE 5.40* 5.59*  CALCIUM  --  8.2*   Liver Function Tests:  Recent Labs Lab 04/10/15 1715  AST 43*  ALT 24  ALKPHOS 110  BILITOT 0.5  PROT 8.4*  ALBUMIN 2.5*   No results for input(s): LIPASE, AMYLASE in the last  168 hours. No results for input(s): AMMONIA in the last 168 hours. CBC:  Recent Labs Lab 04/10/15 1712 04/10/15 1715  WBC  --  20.1*  NEUTROABS  --  17.1*  HGB 9.5* 8.6*  HCT 28.0* 27.6*  MCV  --  88.2  PLT  --  213   Cardiac Enzymes: No results for input(s): CKTOTAL, CKMB, CKMBINDEX, TROPONINI in the last 168 hours.  BNP (last 3 results)  Recent Labs  02/26/15 1726  BNP 187.0*    ProBNP (last 3 results) No results for input(s): PROBNP in the last 8760 hours.  CBG: No results for input(s): GLUCAP in the last 168 hours.  Radiological Exams on Admission: Dg Chest 2 View  04/10/2015   CLINICAL DATA:  Subsequent encounter for diabetes and hypertension.  EXAM: CHEST  2 VIEW  COMPARISON:  03/01/2015.  FINDINGS: Stable asymmetric elevation of the right hemidiaphragm. Pulmonary edema pattern seen previously has resolved in the interval. No edema or focal airspace consolidation on the current exam. No pleural effusion. The cardiopericardial silhouette is within normal limits for size. Imaged bony structures of the thorax are intact.  IMPRESSION: No active cardiopulmonary disease.   Electronically Signed   By: Misty Stanley M.D.   On: 04/10/2015 18:54    EKG: Independently reviewed. No T-wave peaking on EKG, general low voltage.  Assessment/Plan Principal Problem:   Hyperkalemia Active Problems:   Hypertension   Hyperlipidemia   Anemia, chronic disease   Gastroparesis due to DM: DELAYED GASTRIC EMPTYING STUDY 02/08/15   Sepsis secondary to UTI   UTI (lower urinary tract infection)   C. difficile colitis   Protein-calorie malnutrition, severe   Acute renal failure superimposed on stage 5 chronic kidney disease   Abdominal pain   Generalized weakness  Hyperkalemia: It is most likely due to worsening renal function, which is likely caused by UTI and sepsis. K=5,6, without EKG change -will admit to tele bed -give 1 dose of Kayexalate, 30 g 1 -IV fluid: 2.5 L normal saline  bolus, followed 25 mL per hour due to sepsis.  UTI and sepsis secondary to UTI: Patient is a mildly septic on admission with a leukocytosis and tachycardia. Urinalysis is positive for UTI. Hemodynamically stable now. -IV rocephin -will get Procalcitonin and trend lactic acid level -IVF: received 2.5L of NS bolus in ED,  followed by 125 cc/h -follow up blood and urine culture  AoCKA-IV-V: Baseline creatinine 2.0-2.3, his creatinine is 5.59 and BUN 80 on admission. It is likely caused by UTI. May also have prerenal component. -treat UTI as above -IV fluids as above -follow up CT-abd/pelvise to r/o hydronephrosis, which is ordered due to abdominal pain. -Check FeNA -Follow up renal Fx by CMP   HTN: -Continue metoprolol  Diabetes mellitus: A1c was 8.0 on 02/05/15.  -continue home Levemir, but decrease the dose from 10-7 units daily -Sliding-scale insulin  Hyperlipidemia: No LDL on record -Continue apical  Abdominal pain: Etiology is not clear, may be related to UTI. Given hx of C. difficile colitis and very significant leukocytosis with WBC 20.1, it is important to rule out complications from C. difficile colitis -CT-abd/pelvis without constrast -PRN morphine for pain -check C diff prn only if patient develops diarrhea.  Anemia: Hgb dropped slightly 9.5 on 04/10/15-->8.6. -check FOBT given hx of upper GIB -Continue iron supplements  Severe Protein calorie malnutrition: -consult to nutrition team -Ensuure  Generalized weakness: It is most likely due to deconditioning secondary to multiple comorbidities. -pt/ot -Treat underlying problems as above   DVT ppx: SQ Heparin    Code Status: NDR Family Communication:   Yes, patient's  Wife and daugther   at bed side, very supportive. Disposition Plan: Admit to inpatient   Date of Service 04/10/2015    Ivor Costa [name] Pager [phone]  If 7PM-7AM, please contact night-coverage [email or web address] Password [facility] 04/10/2015,  8:05 PM

## 2015-04-10 NOTE — ED Notes (Signed)
Spoke with Phlebotomy Maudie Mercury  stated she will come to room next.

## 2015-04-11 ENCOUNTER — Other Ambulatory Visit: Payer: Self-pay | Admitting: Urology

## 2015-04-11 ENCOUNTER — Inpatient Hospital Stay (HOSPITAL_COMMUNITY): Payer: Medicare Other

## 2015-04-11 LAB — COMPREHENSIVE METABOLIC PANEL
ALT: 29 U/L (ref 0–53)
AST: 59 U/L — ABNORMAL HIGH (ref 0–37)
Albumin: 2.2 g/dL — ABNORMAL LOW (ref 3.5–5.2)
Alkaline Phosphatase: 128 U/L — ABNORMAL HIGH (ref 39–117)
Anion gap: 15 (ref 5–15)
BUN: 74 mg/dL — ABNORMAL HIGH (ref 6–23)
CO2: 15 mmol/L — ABNORMAL LOW (ref 19–32)
Calcium: 7.5 mg/dL — ABNORMAL LOW (ref 8.4–10.5)
Chloride: 112 mmol/L (ref 96–112)
Creatinine, Ser: 5.3 mg/dL — ABNORMAL HIGH (ref 0.50–1.35)
GFR, EST AFRICAN AMERICAN: 11 mL/min — AB (ref >90)
GFR, EST NON AFRICAN AMERICAN: 9 mL/min — AB (ref >90)
Glucose, Bld: 63 mg/dL — ABNORMAL LOW (ref 70–99)
POTASSIUM: 5 mmol/L (ref 3.5–5.1)
Sodium: 142 mmol/L (ref 135–145)
Total Bilirubin: 0.2 mg/dL — ABNORMAL LOW (ref 0.3–1.2)
Total Protein: 7.8 g/dL (ref 6.0–8.3)

## 2015-04-11 LAB — GLUCOSE, CAPILLARY
GLUCOSE-CAPILLARY: 59 mg/dL — AB (ref 70–99)
GLUCOSE-CAPILLARY: 102 mg/dL — AB (ref 70–99)
GLUCOSE-CAPILLARY: 120 mg/dL — AB (ref 70–99)
Glucose-Capillary: 119 mg/dL — ABNORMAL HIGH (ref 70–99)
Glucose-Capillary: 205 mg/dL — ABNORMAL HIGH (ref 70–99)

## 2015-04-11 LAB — CBC
HEMATOCRIT: 26.7 % — AB (ref 39.0–52.0)
HEMOGLOBIN: 8.5 g/dL — AB (ref 13.0–17.0)
MCH: 28.1 pg (ref 26.0–34.0)
MCHC: 31.8 g/dL (ref 30.0–36.0)
MCV: 88.1 fL (ref 78.0–100.0)
Platelets: 168 10*3/uL (ref 150–400)
RBC: 3.03 MIL/uL — AB (ref 4.22–5.81)
RDW: 15.7 % — AB (ref 11.5–15.5)
WBC: 16.1 10*3/uL — ABNORMAL HIGH (ref 4.0–10.5)

## 2015-04-11 LAB — MRSA PCR SCREENING: MRSA by PCR: POSITIVE — AB

## 2015-04-11 LAB — LACTIC ACID, PLASMA: Lactic Acid, Venous: 1.3 mmol/L (ref 0.5–2.0)

## 2015-04-11 MED ORDER — DEXTROSE 50 % IV SOLN
INTRAVENOUS | Status: AC
  Administered 2015-04-11: 25 mL
  Filled 2015-04-11: qty 50

## 2015-04-11 MED ORDER — NEPRO/CARBSTEADY PO LIQD
237.0000 mL | Freq: Every day | ORAL | Status: DC
  Administered 2015-04-15: 237 mL via ORAL

## 2015-04-11 MED ORDER — CETYLPYRIDINIUM CHLORIDE 0.05 % MT LIQD
7.0000 mL | Freq: Two times a day (BID) | OROMUCOSAL | Status: DC
  Administered 2015-04-11 – 2015-04-19 (×17): 7 mL via OROMUCOSAL

## 2015-04-11 MED ORDER — IOHEXOL 300 MG/ML  SOLN
25.0000 mL | INTRAMUSCULAR | Status: AC
  Administered 2015-04-11: 25 mL via ORAL

## 2015-04-11 MED ORDER — SODIUM BICARBONATE 8.4 % IV SOLN
INTRAVENOUS | Status: DC
  Administered 2015-04-11 – 2015-04-12 (×3): via INTRAVENOUS
  Filled 2015-04-11 (×5): qty 150

## 2015-04-11 NOTE — Consult Note (Signed)
Urology Consult  CC: Referring physician: Dr. Tyrell Antonio Reason for referral: Obstructing right ureteral stone  History of Present Illness: Mr. Douglas [name] has a history of a T2b Gleason 7(4+3) prostate cancer treated with radiation therapy in 2010. He developed pelvic lymph node mets and had been started on androgen ablation but he had severe hot flashes with this and was taken off of therapy but his PSA rose to over 20 and the treatment was resumed and then held again when his PSA fell sufficiently. His last PSA in our office in January was 0.55 which was an increased from his nadir of 0.18. He has had a history of urge incontinence and was using 2 pads daily. His recent history is signficant for a fractured right leg in January and after the surgery he developed urinary retention with a PVR of 960ml. He had a foley placed when he was in the [place] center and failed a subsequent voiding trial despite flomax and the foley was reinserted. He is admitted again at [facility] with urosepis and gross hematuria. A renal US was obtained on 02/27/15  and the kidneys were unremarkable with no hydronephrosis noted.   He has now been readmitted to the hospital with worsening renal function with a creatinine of 5.3 and urine that appeared to be infected. He was placed on broad-spectrum antibiotics and a CT scan was obtained. This revealed a 1 cm stone in the proximal ureter on the right-hand side. He is appears to be unable to give a meaningful history however his wife and daughter were in the room and I was able to ascertain that he has been having some pain in his right flank region that began about 3 days ago. He does not appear to be any apparent distress at this time. His wife told me that he had a stone that was "embedded" in his kidney. I was asked to see him regarding his right ureteral stone.  Past Medical History  Diagnosis Date  . Hypertension   . Hyperlipidemia   . Hyperkalemia   . UTI (lower  urinary tract infection)   . Sensorineural hearing loss, unspecified   . Epiglottitis   . Chronic ankle pain   . Neuromuscular disorder     neurophaty lower legs  . Upper GI bleeding     while at [facility] 12/2014  . Confusion   . Prostate cancer 2009    radiation  . Type II diabetes mellitus   . Gastroparesis due to DM: DELAYED GASTRIC EMPTYING STUDY 02/08/15 02/08/2015  . Pneumonia 12/2014; 02/2015  . History of blood transfusion X 3    "related to low HgB"  . Anemia   . GERD (gastroesophageal reflux disease)   . Arthritis     "knees" (04/10/2015)  . History of gout   . Depression     "since he broke his leg in 12/2014" (04/10/2015)  . Calculus of kidney   . Chronic kidney disease (CKD), stage IV (severe)     "he's on the verge of dialysis" (04/10/2015)   Past Surgical History  Procedure Laterality Date  . Eye surgery  sept. 2015    cataract surgery  . Femur im nail Right 01/18/2015    Procedure: INTRAMEDULLARY (IM) RETROGRADE FEMORAL NAILING;  Surgeon: Renette Butters, MD;  Location: [place];  Service: Orthopedics;  Laterality: Right;  . Fracture surgery    . Appendectomy    . Inguinal hernia repair  1970's?  . Cataract extraction w/ intraocular lens  implant,  bilateral Bilateral     Medications:  Scheduled: . antiseptic oral rinse  7 mL Mouth Rinse BID  . atorvastatin  40 mg Oral Daily  . cefTRIAXone (ROCEPHIN)  IV  1 g Intravenous Q24H  . feeding supplement (ENSURE)  1 Container Oral TID BM  . [START ON 04/12/2015] feeding supplement (NEPRO CARB STEADY)  237 mL Oral Q1500  . gabapentin  300 mg Oral QHS  . heparin  5,000 Units Subcutaneous 3 times per day  . insulin aspart  0-9 Units Subcutaneous TID WC  . iron polysaccharides  150 mg Oral Daily  . ketorolac  1 drop Both Eyes TID  . metoCLOPramide  5 mg Oral TID AC & HS  . saccharomyces boulardii  250 mg Oral BID  . tamsulosin  0.4 mg Oral Daily   Continuous: .  sodium bicarbonate  infusion 1000 mL 100 mL/hr at 04/11/15 7106     Allergies: No Known Allergies  Family History  Problem Relation Age of Onset  . Breast cancer Mother   . Hypertension Father   . Heart attack Father   . Heart attack Father   . Thyroid disease Sister     Social History:  reports that he has quit smoking. His smoking use included Cigarettes. He quit after 1 year of use. He has quit using smokeless tobacco. His smokeless tobacco use included Chew. He reports that he does not drink alcohol or use illicit drugs.  Review of Systems: Pertinent items are noted in HPI. A comprehensive review of systems was not obtainable from the patient however I was able to determine he has been having some right flank pain. He's not having any suprapubic discomfort.  Physical Exam:  Vital signs in last 24 hours: Temp:  [98.1 F (36.7 C)-100.9 F (38.3 C)] 98.2 F (36.8 C) (04/14 1000) Pulse Rate:  [86-124] 114 (04/14 1000) Resp:  [17-22] 18 (04/14 1000) BP: (114-153)/(49-106) 118/60 mmHg (04/14 1000) SpO2:  [86 %-100 %] 99 % (04/14 1000) Weight:  [55.3 kg (121 lb 14.6 oz)] 55.3 kg (121 lb 14.6 oz) (04/13 2007) General appearance: alert and appears stated age Head: Normocephalic, without obvious abnormality, atraumatic Eyes: conjunctivae/corneas clear. EOM's intact.  Oropharynx: moist mucous membranes Neck: supple, symmetrical, trachea midline Resp: normal respiratory effort Cardio: regular rate and rhythm Back: symmetric, no curvature. ROM normal. No CVA tenderness. GI: soft, non-tender; bowel sounds normal; no masses,  no organomegaly  Male genitalia: penis: normal male phallus with no lesions or discharge with a Foley catheter indwelling draining clear urine.Testes: bilaterally descended with no masses or tenderness. no hernias  Extremities: extremities normal, atraumatic, no cyanosis or edema Skin: Skin color normal. No visible rashes or lesions Neurologic: Grossly normal  Laboratory Data:   Recent Labs  04/10/15 1712  04/10/15 1715 04/11/15 0628  WBC  --  20.1* 16.1*  HGB 9.5* 8.6* 8.5*  HCT 28.0* 27.6* 26.7*   BMET  Recent Labs  04/10/15 1715 04/11/15 0628  NA 137 142  K 5.6* 5.0  CL 104 112  CO2 19 15*  GLUCOSE 111* 63*  BUN 80* 74*  CREATININE 5.59* 5.30*  CALCIUM 8.2* 7.5*    Recent Labs  04/10/15 2133  INR 1.61*   No results for input(s): LABURIN in the last 72 hours. Results for orders placed or performed during the hospital encounter of 04/10/15  MRSA PCR Screening     Status: Abnormal   Collection Time: 04/10/15  8:45 PM  Result Value Ref Range Status  MRSA by PCR POSITIVE (A) NEGATIVE Final    Comment:        The GeneXpert MRSA Assay (FDA approved for NASAL specimens only), is one component of a comprehensive MRSA colonization surveillance program. It is not intended to diagnose MRSA infection nor to guide or monitor treatment for MRSA infections. RESULT CALLED TO, READ BACK BY AND VERIFIED WITH: CALLED TO RN REBECCA QIU [id number] @0026  THANEY    Creatinine:  Recent Labs  04/10/15 1712 04/10/15 1715 04/11/15 0628  CREATININE 5.40* 5.59* 5.30*    Imaging: Ct Abdomen Pelvis Wo Contrast  04/11/2015   CLINICAL DATA:  Right lower quadrant pain for 3 days with elevated white blood count  EXAM: CT ABDOMEN AND PELVIS WITHOUT CONTRAST  TECHNIQUE: Multidetector CT imaging of the abdomen and pelvis was performed following the standard protocol without IV contrast.  COMPARISON:  09/18/2013  FINDINGS: Tiny bilateral pleural effusions. Heavy coronary artery calcification.  Numerous punctate calcifications within the liver suggesting prior granulomatous disease. Spleen upper normal with a span of 12 cm. Gallbladder mildly distended at 9.7 cm with no stone identified. Stomach and pancreas appear normal. Adrenal glands appear normal.  Bilateral perinephric inflammation likely chronic with superimposed acute perinephric inflammation on the right. Severe right hydronephrosis. Renal  pelvis on the right is 2.5 cm. There are 2 stones within the renal pelvis measuring about 2-3 mm each. Just beyond the ureteropelvic junction there is a 10 mm stone beyond which the ureter shows a more normal size. No hydronephrosis on the left.  Mild diffuse bladder wall thickening. Evidence of prostatectomy defect. Small volume ascites abdomen pelvis. No abnormally dilated loops of bowel. Air-fluid levels throughout the colon.  Heavy calcification of the abdominal aorta. 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  No acute musculoskeletal findings.  IMPRESSION: Right-sided obstructive nephropathy due to 1 cm stone just beyond ureteropelvic junction fight distance of about 3.5 cm. Numerous other findings as described above.   Electronically Signed   By: Skipper Cliche M.D.   On: 04/11/2015 12:45   Dg Chest 2 View  04/10/2015   CLINICAL DATA:  Subsequent encounter for diabetes and hypertension.  EXAM: CHEST  2 VIEW  COMPARISON:  03/01/2015.  FINDINGS: Stable asymmetric elevation of the right hemidiaphragm. Pulmonary edema pattern seen previously has resolved in the interval. No edema or focal airspace consolidation on the current exam. No pleural effusion. The cardiopericardial silhouette is within normal limits for size. Imaged bony structures of the thorax are intact.  IMPRESSION: No active cardiopulmonary disease.   Electronically Signed   By: Misty Stanley M.D.   On: 04/10/2015 18:54    Impression/Assessment:  He has an obstructing right proximal ureteral stone and hydronephrosis on his CT scan and did not have any hydronephrosis on his renal ultrasound and therefore it would appear the stone in his right proximal ureter is the stone that was previously located in his right kidney. Without any recent imaging studies I am unable to tell if this stone has been present for a long period of time or whether it is just passed from the kidney into the proximal ureter however  his history is consistent with recent passage of the stone into the ureter. In addition he appears to have infected urine but his white blood cell count has improved and he is not febrile. I therefore have discussed the placement of a double-J stent with he and his family. I went over the procedure with them in detail  including however it was performed, the risks and complications, the fact that I would just be placing a stent and not addressing the stone at this time and the rationale for that. In addition we discussed the fact that there is a possibility that I may not be able to get a stent beyond the stone and if that is the case he will need to undergo a percutaneous nephrostomy tube placement to drain his right kidney. I discussed with them my anticipated probability of success as well as the anticipated postoperative course and need to treat the stone definitively at a later date once his infection has been eradicated.   Plan:  1. Continue intravenous antibiotics while awaiting culture results. 2. He is scheduled for right retrograde pyelogram and double-J stent placement tomorrow at noon. 3. Nothing by mouth after midnight. 4. After his procedure at [facility] he will return to [facility] 04/11/2015, 3:47 PM

## 2015-04-11 NOTE — Progress Notes (Signed)
Hypoglycemic Event  CBG: 59  Treatment: D50 IV 25 mL  Symptoms: None  Follow-up CBG: [id number] am CBG Result:119  Possible Reasons for Event: Inadequate meal intake  Comments/MD notified:Dr. Hilma Favors

## 2015-04-11 NOTE — Progress Notes (Signed)
INITIAL NUTRITION ASSESSMENT  Pt meets criteria for SEVERE MALNUTRITION in the context of chronic illness as evidenced by a 15.9% weight loss in 4 months and severe muscle mass loss.  DOCUMENTATION CODES Per approved criteria  -Severe malnutrition in the context of chronic illness   INTERVENTION: Once diet advances,  Continue Ensure Pudding po TID, each supplement provides 170 kcal and 4 grams of protein.  Provide Nepro Shake po once daily, each supplement provides 425 kcal and 19 grams protein.  RD to continue to monitor.  NUTRITION DIAGNOSIS: Malnutrition related to chronic illness as evidenced by weight loss and severe muscle mass loss.   Goal: Pt to meet >/= 90% of their estimated nutrition needs   Monitor:  Diet advancement, weight trends, labs, I/O's  Reason for Assessment: MD consult for assessment of nutrition requirements/status  81 y.o. male  Admitting Dx: Hyperkalemia  ASSESSMENT: Pt with past medical history for HTN, hyperlipidemia, DM, anemia, peripheral neuropathy, upper GI bleeding, prostate cancer 2009, gastroparesis, chronic kidney disease-IV-V, history of C. difficile colitis, who presents with generalized weakness and abdominal pain.  Pt is currently NPO. Family at bedside. Pt was asleep during time of visit and would not wake. Family reports pt's appetite has been decreased over the past 3 months. They report pt has been having intermittent n/v. Pt has mostly been eating grilled cheese sandwiches and soups along with Ensure pudding 2-3 times daily. Usual body weight reported to be 144 lbs. Noted, pt with a 15.9% weight loss in 4 months. Ensure pudding currently has been ordered. RD to additionally order Nepro Shake as family is agreeable to it. Family was educated on foods the pt usually eats that are high in potassium and the importance of limiting/avoiding them.   Nutrition Focused Physical Exam:  Subcutaneous Fat:  Orbital Region: N/A Upper Arm Region:  Moderate depletion Thoracic and Lumbar Region: Moderate depletion  Muscle:  Temple Region: N/A Clavicle Bone Region: Mild depletion Clavicle and Acromion Bone Region: Moderate depletion Scapular Bone Region: N/A Dorsal Hand: N/A Patellar Region: Severe depletion Anterior Thigh Region: Severe depletion Posterior Calf Region: Severe depletion  Edema: none  Labs: Low CO2, calcium, total bilirubin, and GFR. High BUN, creatinine, AST, alkaline phosphatase.  Height: Ht Readings from Last 1 Encounters:  04/10/15 5\' 5"  (1.651 m)    Weight: Wt Readings from Last 1 Encounters:  04/10/15 121 lb 14.6 oz (55.3 kg)    Ideal Body Weight: 136 lbs  % Ideal Body Weight: 89%  Wt Readings from Last 10 Encounters:  04/10/15 121 lb 14.6 oz (55.3 kg)  03/16/15 129 lb (58.514 kg)  03/09/15 134 lb 8 oz (61.009 kg)  02/02/15 137 lb 11.2 oz (62.46 kg)  01/17/15 144 lb (65.318 kg)  11/27/14 154 lb 5.1 oz (69.999 kg)  11/17/14 154 lb 5.2 oz (70 kg)  11/15/14 140 lb (63.504 kg)  10/15/14 140 lb (63.504 kg)  10/13/14 140 lb (63.504 kg)    Usual Body Weight: 144 lbs  % Usual Body Weight: 84%  BMI:  Body mass index is 20.29 kg/(m^2).  Estimated Nutritional Needs: Kcal: 1850-2050 Protein: 85-105 grams Fluid: Per MD  Skin: Unstagable pressure ulcer on L heel, Stage II pressure ulcer on sacrum  Diet Order: Diet NPO time specified Except for: Sips with Meds  EDUCATION NEEDS: -Education needs addressed   Intake/Output Summary (Last 24 hours) at 04/11/15 0926 Last data filed at 04/11/15 0701  Gross per 24 hour  Intake      0  ml  Output    150 ml  Net   -150 ml    Last BM: 4/13  Labs:   Recent Labs Lab 04/10/15 1712 04/10/15 1715 04/11/15 0628  NA 138 137 142  K 5.7* 5.6* 5.0  CL 106 104 112  CO2  --  19 15*  BUN 88* 80* 74*  CREATININE 5.40* 5.59* 5.30*  CALCIUM  --  8.2* 7.5*  GLUCOSE 106* 111* 63*    CBG (last 3)   Recent Labs  04/10/15 2022 04/11/15 0812  04/11/15 0842  GLUCAP 118* 59* 119*    Scheduled Meds: . antiseptic oral rinse  7 mL Mouth Rinse BID  . atorvastatin  40 mg Oral Daily  . cefTRIAXone (ROCEPHIN)  IV  1 g Intravenous Q24H  . feeding supplement (ENSURE)  1 Container Oral TID BM  . gabapentin  300 mg Oral QHS  . heparin  5,000 Units Subcutaneous 3 times per day  . insulin aspart  0-9 Units Subcutaneous TID WC  . iohexol  25 mL Oral Q1 Hr x 2  . iron polysaccharides  150 mg Oral Daily  . ketorolac  1 drop Both Eyes TID  . metoCLOPramide  5 mg Oral TID AC & HS  . saccharomyces boulardii  250 mg Oral BID  . tamsulosin  0.4 mg Oral Daily    Continuous Infusions: .  sodium bicarbonate  infusion 1000 mL      Past Medical History  Diagnosis Date  . Hypertension   . Hyperlipidemia   . Hyperkalemia   . UTI (lower urinary tract infection)   . Sensorineural hearing loss, unspecified   . Epiglottitis   . Chronic ankle pain   . Neuromuscular disorder     neurophaty lower legs  . Upper GI bleeding     while at [facility] 12/2014  . Confusion   . Prostate cancer 2009    radiation  . Type II diabetes mellitus   . Gastroparesis due to DM: DELAYED GASTRIC EMPTYING STUDY 02/08/15 02/08/2015  . Pneumonia 12/2014; 02/2015  . History of blood transfusion X 3    "related to low HgB"  . Anemia   . GERD (gastroesophageal reflux disease)   . Arthritis     "knees" (04/10/2015)  . History of gout   . Depression     "since he broke his leg in 12/2014" (04/10/2015)  . Calculus of kidney   . Chronic kidney disease (CKD), stage IV (severe)     "he's on the verge of dialysis" (04/10/2015)    Past Surgical History  Procedure Laterality Date  . Eye surgery  sept. 2015    cataract surgery  . Femur im nail Right 01/18/2015    Procedure: INTRAMEDULLARY (IM) RETROGRADE FEMORAL NAILING;  Surgeon: Renette Butters, MD;  Location: [place];  Service: Orthopedics;  Laterality: Right;  . Fracture surgery    . Appendectomy    . Inguinal hernia repair   1970's?  . Cataract extraction w/ intraocular lens  implant, bilateral Bilateral     Kallie Locks, MS, RD, LDN Pager # [phone] After hours/ weekend pager # [phone]

## 2015-04-11 NOTE — Progress Notes (Signed)
Attempted to see pt x2. Pt at CT scan.  Will return as schedule allows. Jinger Neighbors, [place] [phone]

## 2015-04-11 NOTE — Progress Notes (Signed)
Inserted foley catheter per MD order. Pt tolerated procedure well. Will continue to monitor pt.   Gavin Potters

## 2015-04-11 NOTE — Progress Notes (Signed)
Temperature 100.9 F orally.  Patient NPO currently.  Notified Tylene Fantasia, NP.  Will continue to monitor.

## 2015-04-11 NOTE — Progress Notes (Signed)
[name] PROGRESS NOTE  Douglas [name] FMB:[id number] DOB: [birth year] DOA: 04/10/2015 PCP: [name]., MD  Assessment/Plan: 1-Hyperkalemia; in setting of renal failure.  Received kayexalate. k has decreased.  IV bicarb gtt.   2-Sepsis, UTI, pyelonephritis.  IV fluids. IV antibiotics.  CT abdomen showing Obstructive uropathy.  Urology consulted. Might need to be transfer to [place] [facility] for procedure.   3-Acute on chronic renal failure stage IV; obstructive uropathy:  Follow cr, IV fluids. Component of obstructive uropathy.  Urology consulted.  Bicarb Gtt.   4-Diabetes: hold levemir. SSI.  Hypoglycemia. Added D 5 to IV fluids.   Abdominal pain; CT showed, kidney stone.   Anemia: Hgb dropped slightly 9.5 on 04/10/15-->8.6. -check FOBT given hx of upper GIB -Continue iron supplements  Severe Protein calorie malnutrition: -consult to nutrition team -Ensure  Generalized weakness: It is most likely due to deconditioning secondary to multiple comorbidities. -pt/ot -Treat underlying problems as above  Code Status: Full Code.  Family Communication: care discussed with Wife.  Disposition Plan: inpatient treatment for sepsis, pyelonephritis.    Consultants: Urology  Procedures:  none  Antibiotics:  Ceftriaxone  HPI/Subjective: Patient feeling better today but not at baseline. Still feeling sick, nauseous. Relates right side flank pain.  Denies diarrhea.   Objective: Filed Vitals:   04/11/15 0550  BP: 129/79  Pulse: 124  Temp: 100.9 F (38.3 C)  Resp: 17    Intake/Output Summary (Last 24 hours) at 04/11/15 0912 Last data filed at 04/11/15 0701  Gross per 24 hour  Intake      0 ml  Output    150 ml  Net   -150 ml   Filed Weights   04/10/15 2007  Weight: 55.3 kg (121 lb 14.6 oz)    Exam:   General:  Alert in no distress, sick appearing.   Cardiovascular: S 1, S 2 RRR  Respiratory: CTA  Abdomen: BS present, soft, right side  tenderness.   Musculoskeletal: no edema  Data Reviewed: Basic Metabolic Panel:  Recent Labs Lab 04/10/15 1712 04/10/15 1715 04/11/15 0628  NA 138 137 142  K 5.7* 5.6* 5.0  CL 106 104 112  CO2  --  19 15*  GLUCOSE 106* 111* 63*  BUN 88* 80* 74*  CREATININE 5.40* 5.59* 5.30*  CALCIUM  --  8.2* 7.5*   Liver Function Tests:  Recent Labs Lab 04/10/15 1715 04/11/15 0628  AST 43* 59*  ALT 24 29  ALKPHOS 110 128*  BILITOT 0.5 0.2*  PROT 8.4* 7.8  ALBUMIN 2.5* 2.2*   No results for input(s): LIPASE, AMYLASE in the last 168 hours. No results for input(s): AMMONIA in the last 168 hours. CBC:  Recent Labs Lab 04/10/15 1712 04/10/15 1715 04/11/15 0628  WBC  --  20.1* 16.1*  NEUTROABS  --  17.1*  --   HGB 9.5* 8.6* 8.5*  HCT 28.0* 27.6* 26.7*  MCV  --  88.2 88.1  PLT  --  213 168   Cardiac Enzymes: No results for input(s): CKTOTAL, CKMB, CKMBINDEX, TROPONINI in the last 168 hours. BNP (last 3 results)  Recent Labs  02/26/15 1726  BNP 187.0*    ProBNP (last 3 results) No results for input(s): PROBNP in the last 8760 hours.  CBG:  Recent Labs Lab 04/10/15 2022 04/11/15 0812 04/11/15 0842  GLUCAP 118* 59* 119*    Recent Results (from the past 240 hour(s))  MRSA PCR Screening     Status: Abnormal   Collection Time: 04/10/15  8:45 PM  Result Value Ref Range Status   MRSA by PCR POSITIVE (A) NEGATIVE Final    Comment:        The GeneXpert MRSA Assay (FDA approved for NASAL specimens only), is one component of a comprehensive MRSA colonization surveillance program. It is not intended to diagnose MRSA infection nor to guide or monitor treatment for MRSA infections. RESULT CALLED TO, READ BACK BY AND VERIFIED WITH: CALLED TO RN REBECCA QIU [id number] @0026  THANEY      Studies: Dg Chest 2 View  04/10/2015   CLINICAL DATA:  Subsequent encounter for diabetes and hypertension.  EXAM: CHEST  2 VIEW  COMPARISON:  03/01/2015.  FINDINGS: Stable asymmetric  elevation of the right hemidiaphragm. Pulmonary edema pattern seen previously has resolved in the interval. No edema or focal airspace consolidation on the current exam. No pleural effusion. The cardiopericardial silhouette is within normal limits for size. Imaged bony structures of the thorax are intact.  IMPRESSION: No active cardiopulmonary disease.   Electronically Signed   By: Misty Stanley M.D.   On: 04/10/2015 18:54    Scheduled Meds: . antiseptic oral rinse  7 mL Mouth Rinse BID  . atorvastatin  40 mg Oral Daily  . cefTRIAXone (ROCEPHIN)  IV  1 g Intravenous Q24H  . feeding supplement (ENSURE)  1 Container Oral TID BM  . gabapentin  300 mg Oral QHS  . heparin  5,000 Units Subcutaneous 3 times per day  . insulin aspart  0-9 Units Subcutaneous TID WC  . iohexol  25 mL Oral Q1 Hr x 2  . iron polysaccharides  150 mg Oral Daily  . ketorolac  1 drop Both Eyes TID  . metoCLOPramide  5 mg Oral TID AC & HS  . saccharomyces boulardii  250 mg Oral BID  . tamsulosin  0.4 mg Oral Daily   Continuous Infusions: .  sodium bicarbonate  infusion 1000 mL      Principal Problem:   Hyperkalemia Active Problems:   Hypertension   Hyperlipidemia   Anemia, chronic disease   Gastroparesis due to DM: DELAYED GASTRIC EMPTYING STUDY 02/08/15   Sepsis secondary to UTI   UTI (lower urinary tract infection)   C. difficile colitis   Protein-calorie malnutrition, severe   Acute renal failure superimposed on stage 5 chronic kidney disease   Abdominal pain   Generalized weakness    Time spent: 35 minutes.     Niel Hummer A  [name] Pager [phone] If 7PM-7AM, please contact night-coverage at [email or web address], password [facility] 04/11/2015, 9:12 AM  LOS: 1 day

## 2015-04-11 NOTE — Progress Notes (Signed)
PT Cancellation Note  Patient Details Name: Douglas [name] MRN: [id number] DOB: 27-Jun-1931   Cancelled Treatment:    Reason Eval/Treat Not Completed: Patient at procedure or test/unavailable.  Pt off unit for CT scan at this time. Will check back as schedule allows to complete PT eval.    Rolinda Roan 04/11/2015, 12:35 PM   Rolinda Roan, PT, DPT Acute Rehabilitation Services Pager: [phone]

## 2015-04-11 NOTE — Progress Notes (Signed)
[facility]  Patient Status: Active (receiving services up to time of hospitalization)  [facility] is providing the following services: RN and OT  If patient discharges after hours, please call [phone].   Douglas [name] 04/11/2015, 10:58 AM

## 2015-04-12 ENCOUNTER — Inpatient Hospital Stay (HOSPITAL_COMMUNITY): Payer: Medicare Other | Admitting: Anesthesiology

## 2015-04-12 ENCOUNTER — Encounter (HOSPITAL_COMMUNITY)
Admission: EM | Disposition: A | Discharge status: Home / Self Care | Payer: Self-pay | Source: Home / Self Care | Attending: Internal Medicine

## 2015-04-12 ENCOUNTER — Encounter (HOSPITAL_COMMUNITY): Payer: Self-pay

## 2015-04-12 ENCOUNTER — Other Ambulatory Visit (HOSPITAL_COMMUNITY)
Admission: RE | Admit: 2015-04-12 | Discharge: 2015-04-12 | Disposition: A | Discharge status: Home / Self Care | Payer: Medicare Other | Source: Ambulatory Visit | Attending: Urology | Admitting: Urology

## 2015-04-12 ENCOUNTER — Other Ambulatory Visit (HOSPITAL_COMMUNITY)
Admission: AD | Admit: 2015-04-12 | Discharge: 2015-04-12 | Disposition: A | Discharge status: Home / Self Care | Payer: Medicare Other | Source: Ambulatory Visit | Attending: Urology | Admitting: Urology

## 2015-04-12 DIAGNOSIS — D509 Iron deficiency anemia, unspecified: Secondary | ICD-10-CM | POA: Insufficient documentation

## 2015-04-12 DIAGNOSIS — Z794 Long term (current) use of insulin: Secondary | ICD-10-CM | POA: Insufficient documentation

## 2015-04-12 DIAGNOSIS — E119 Type 2 diabetes mellitus without complications: Secondary | ICD-10-CM | POA: Insufficient documentation

## 2015-04-12 HISTORY — PX: CYSTOSCOPY W/ URETERAL STENT PLACEMENT: SHX1429

## 2015-04-12 LAB — URINE CULTURE: Colony Count: 75000

## 2015-04-12 LAB — CBC
HEMATOCRIT: 22.3 % — AB (ref 39.0–52.0)
Hemoglobin: 7.1 g/dL — ABNORMAL LOW (ref 13.0–17.0)
MCH: 27.5 pg (ref 26.0–34.0)
MCHC: 31.8 g/dL (ref 30.0–36.0)
MCV: 86.4 fL (ref 78.0–100.0)
PLATELETS: 162 10*3/uL (ref 150–400)
RBC: 2.58 MIL/uL — ABNORMAL LOW (ref 4.22–5.81)
RDW: 15.8 % — ABNORMAL HIGH (ref 11.5–15.5)
WBC: 10.5 10*3/uL (ref 4.0–10.5)

## 2015-04-12 LAB — BASIC METABOLIC PANEL
ANION GAP: 15 (ref 5–15)
ANION GAP: 13 (ref 5–15)
BUN: 70 mg/dL — ABNORMAL HIGH (ref 6–23)
BUN: 64 mg/dL — ABNORMAL HIGH (ref 6–23)
CALCIUM: 6.8 mg/dL — AB (ref 8.4–10.5)
CO2: 24 mmol/L (ref 19–32)
CO2: 23 mmol/L (ref 19–32)
CREATININE: 4.99 mg/dL — AB (ref 0.50–1.35)
Calcium: 6.4 mg/dL — CL (ref 8.4–10.5)
Chloride: 100 mmol/L (ref 96–112)
Chloride: 102 mmol/L (ref 96–112)
Creatinine, Ser: 4.64 mg/dL — ABNORMAL HIGH (ref 0.50–1.35)
GFR calc Af Amer: 11 mL/min — ABNORMAL LOW (ref >90)
GFR calc Af Amer: 12 mL/min — ABNORMAL LOW (ref >90)
GFR calc non Af Amer: 10 mL/min — ABNORMAL LOW (ref >90)
GFR calc non Af Amer: 11 mL/min — ABNORMAL LOW (ref >90)
Glucose, Bld: 165 mg/dL — ABNORMAL HIGH (ref 70–99)
Glucose, Bld: 217 mg/dL — ABNORMAL HIGH (ref 70–99)
Potassium: 3 mmol/L — ABNORMAL LOW (ref 3.5–5.1)
Potassium: 3.2 mmol/L — ABNORMAL LOW (ref 3.5–5.1)
SODIUM: 139 mmol/L (ref 135–145)
SODIUM: 138 mmol/L (ref 135–145)

## 2015-04-12 LAB — GLUCOSE, CAPILLARY
GLUCOSE-CAPILLARY: 146 mg/dL — AB (ref 70–99)
GLUCOSE-CAPILLARY: 161 mg/dL — AB (ref 70–99)
Glucose-Capillary: 178 mg/dL — ABNORMAL HIGH (ref 70–99)

## 2015-04-12 LAB — PREPARE RBC (CROSSMATCH)

## 2015-04-12 SURGERY — CYSTOSCOPY, WITH RETROGRADE PYELOGRAM AND URETERAL STENT INSERTION
Anesthesia: General | Site: Perineum | Laterality: Right

## 2015-04-12 MED ORDER — LACTATED RINGERS IV SOLN
INTRAVENOUS | Status: DC | PRN
  Administered 2015-04-12: 13:00:00 via INTRAVENOUS

## 2015-04-12 MED ORDER — FLUCONAZOLE IN SODIUM CHLORIDE 200-0.9 MG/100ML-% IV SOLN
200.0000 mg | INTRAVENOUS | Status: DC
  Administered 2015-04-12 – 2015-04-14 (×3): 200 mg via INTRAVENOUS
  Filled 2015-04-12 (×5): qty 100

## 2015-04-12 MED ORDER — SODIUM CHLORIDE 0.9 % IV SOLN
Freq: Once | INTRAVENOUS | Status: DC
  Filled 2015-04-12: qty 1000

## 2015-04-12 MED ORDER — 0.9 % SODIUM CHLORIDE (POUR BTL) OPTIME
TOPICAL | Status: DC | PRN
  Administered 2015-04-12: 1000 mL

## 2015-04-12 MED ORDER — LIDOCAINE HCL (CARDIAC) 20 MG/ML IV SOLN
INTRAVENOUS | Status: DC | PRN
  Administered 2015-04-12: 50 mg via INTRAVENOUS

## 2015-04-12 MED ORDER — FENTANYL CITRATE (PF) 100 MCG/2ML IJ SOLN
INTRAMUSCULAR | Status: DC | PRN
  Administered 2015-04-12 (×2): 25 ug via INTRAVENOUS

## 2015-04-12 MED ORDER — SODIUM CHLORIDE 0.9 % IV SOLN
Freq: Once | INTRAVENOUS | Status: AC
  Administered 2015-04-12: 14:00:00 via INTRAVENOUS

## 2015-04-12 MED ORDER — PROPOFOL 10 MG/ML IV BOLUS
INTRAVENOUS | Status: DC | PRN
  Administered 2015-04-12: 95 mg via INTRAVENOUS

## 2015-04-12 MED ORDER — PHENYLEPHRINE HCL 10 MG/ML IJ SOLN
INTRAMUSCULAR | Status: DC | PRN
  Administered 2015-04-12 (×4): 40 ug via INTRAVENOUS

## 2015-04-12 MED ORDER — DEXTROSE-NACL 5-0.9 % IV SOLN
INTRAVENOUS | Status: DC
  Administered 2015-04-12 – 2015-04-13 (×2): via INTRAVENOUS
  Filled 2015-04-12: qty 1000

## 2015-04-12 MED ORDER — IOHEXOL 300 MG/ML  SOLN
INTRAMUSCULAR | Status: DC | PRN
  Administered 2015-04-12: 50 mL

## 2015-04-12 SURGICAL SUPPLY — 13 items
BAG URO CATCHER STRL LF (DRAPE) ×2 IMPLANT
BASKET ZERO TIP NITINOL 2.4FR (BASKET) IMPLANT
BSKT STON RTRVL ZERO TP 2.4FR (BASKET)
CATH INTERMIT  6FR 70CM (CATHETERS) ×2 IMPLANT
GLOVE BIOGEL M 8.0 STRL (GLOVE) ×2 IMPLANT
GOWN STRL REUS W/ TWL XL LVL3 (GOWN DISPOSABLE) ×1 IMPLANT
GOWN STRL REUS W/TWL XL LVL3 (GOWN DISPOSABLE) ×4 IMPLANT
GUIDEWIRE ANG ZIPWIRE 038X150 (WIRE) ×1 IMPLANT
GUIDEWIRE STR DUAL SENSOR (WIRE) IMPLANT
MANIFOLD NEPTUNE II (INSTRUMENTS) ×2 IMPLANT
PACK CYSTO (CUSTOM PROCEDURE TRAY) ×2 IMPLANT
STENT URET 6FRX24 CONTOUR (STENTS) ×1 IMPLANT
TUBING CONNECTING 10 (TUBING) ×2 IMPLANT

## 2015-04-12 NOTE — Anesthesia Preprocedure Evaluation (Addendum)
Anesthesia Evaluation  Patient identified by MRN, date of birth, ID band Patient awake    Reviewed: Allergy & Precautions, NPO status , Patient's Chart, lab work & pertinent test results  Airway Mallampati: II  TM Distance: >3 FB Neck ROM: Full    Dental no notable dental hx.    Pulmonary pneumonia -, resolved, former smoker,  breath sounds clear to auscultation  Pulmonary exam normal       Cardiovascular hypertension, Pt. on medications and Pt. on home beta blockers Rhythm:Regular Rate:Normal     Neuro/Psych PSYCHIATRIC DISORDERS Depression  Neuromuscular disease    GI/Hepatic Neg liver ROS, GERD-  ,  Endo/Other  diabetes, Type 1, Insulin Dependent  Renal/GU Renal disease  negative genitourinary   Musculoskeletal  (+) Arthritis -,   Abdominal   Peds negative pediatric ROS (+)  Hematology  (+) anemia ,   Anesthesia Other Findings   Reproductive/Obstetrics negative OB ROS                            Anesthesia Physical Anesthesia Plan  ASA: III  Anesthesia Plan: General   Post-op Pain Management:    Induction: Intravenous  Airway Management Planned: LMA  Additional Equipment:   Intra-op Plan:   Post-operative Plan: Extubation in OR  Informed Consent: I have reviewed the patients History and Physical, chart, labs and discussed the procedure including the risks, benefits and alternatives for the proposed anesthesia with the patient or authorized representative who has indicated his/her understanding and acceptance.   Dental advisory given  Plan Discussed with: CRNA  Anesthesia Plan Comments:        Anesthesia Quick Evaluation

## 2015-04-12 NOTE — Transfer of Care (Signed)
Immediate Anesthesia Transfer of Care Note  Patient: Douglas [name]  Procedure(s) Performed: Procedure(s): RIGHT RETROGRADE PYELOGRAM STENT PLACEMENT (Right)  Patient Location: PACU  Anesthesia Type:General  Level of Consciousness: awake, oriented, patient cooperative, lethargic and responds to stimulation  Airway & Oxygen Therapy: Patient Spontanous Breathing and Patient connected to face mask oxygen  Post-op Assessment: Report given to RN, Post -op Vital signs reviewed and stable and Patient moving all extremities  Post vital signs: Reviewed and stable  Last Vitals:  Filed Vitals:   04/12/15 1029  BP: 112/50  Pulse: 102  Temp: 37.5 C  Resp: 18    Complications: No apparent anesthesia complications

## 2015-04-12 NOTE — Anesthesia Postprocedure Evaluation (Signed)
  Anesthesia Post-op Note  Patient: Douglas [name]  Procedure(s) Performed: Procedure(s) (LRB): RIGHT RETROGRADE PYELOGRAM STENT PLACEMENT (Right)  Patient Location: PACU  Anesthesia Type: General  Level of Consciousness: awake and alert   Airway and Oxygen Therapy: Patient Spontanous Breathing  Post-op Pain: mild  Post-op Assessment: Post-op Vital signs reviewed, Patient's Cardiovascular Status Stable, Respiratory Function Stable, Patent Airway and No signs of Nausea or vomiting  Last Vitals:  Filed Vitals:   04/12/15 1530  BP: 144/67  Pulse: 71  Temp: 36.6 C  Resp: 19    Post-op Vital Signs: stable   Complications: No apparent anesthesia complications. He will be transfused one unit pRBCs per hospitalist plan, and will be carelinked back to [facility] hospital.

## 2015-04-12 NOTE — Progress Notes (Signed)
[facility] called at 11:20 am  for pt's transport to [facility]. [facility] reported that they are behind. [facility] called at 11:45 am  and stated they are on their way.   Gavin Potters

## 2015-04-12 NOTE — Op Note (Signed)
PATIENT:  Douglas [name]  Preoperative diagnosis:  1. Obstructing right ureteral stone 2. Urine culture growing yeast.  Postoperative diagnosis:  1. Obstructing right ureteral stone. 2.  Pyelonephrosis 3.  Yeast UTI  Procedure:  1. Cystoscopy 2. Right retrograde pyelogram with interpretation. 3. Right ureteral/renal catheterization for specimen 4. Right double-J stent placement  Surgeon: Elta Guadeloupe C. Karsten Ro, M.D.  Anesthesia: General  Complications: None  EBL: Minimal  Specimens: Pus from right renal pelvis for Gram stain and culture.  Drains: 6 French, 24 cm double-J stent in the left ureter (no straining) and a 16 French Foley catheter in the bladder.  Indication: Douglas [name] is an 81 year old male with an obstructing right upper ureteral stone, flank pain and an initial urine culture is suspicious for infection. After reviewing the management options for treatment, they have elected to proceed with the above surgical procedure(s). We have discussed the potential benefits and risks of the procedure, side effects of the proposed treatment, the likelihood of the patient achieving the goals of the procedure, and any potential problems that might occur during the procedure or recuperation. Informed consent has been obtained.  Description of procedure:  The patient was taken to the operating room and general anesthesia was induced.  The patient was placed in the dorsal lithotomy position, prepped and draped in the usual sterile fashion, and preoperative antibiotics were administered. A preoperative time-out was performed.   Cystourethroscopy was performed.  The patient's urethra was examined and was normal down to the prostatic urethra which seems somewhat fixed. The bladder was then systematically examined in its entirety. There was no evidence for any bladder tumors, stones, or other mucosal pathology other than some catheter irritation on the posterior wall.    Attention then  turned to the left ureteral orifice and a 6 French ureteral catheter was used to intubate the ureteral orifice.  Omnipaque contrast was injected through the ureteral catheter and a retrograde pyelogram was performed with utilization real-time fluoroscopy which revealed a normal ureter up to the stone which was seen as a filling defect and then I did see contrast progressing past the stone but did not inject further to maintain low pressure.  A 0.038 sensor guidewire was then advanced up the left ureter into the renal pelvis under fluoroscopic guidance.  I then passed the 6 [place] open-ended ureteral catheter over the guidewire, past the stone and into the area of the renal pelvis.  I then removed the guidewire and affixed a syringe to the stent and withdrew approximately 10 cc of grossly purulent fluid.  This was sent for culture and sensitivity.  The guidewire was then passed through the open-ended catheter back into the area of the upper pole of the kidney and the open-ended catheter was removed.  The wire was then backloaded through the cystoscope and a ureteral stent was advance over the wire using Seldinger technique.  The stent was positioned appropriately under fluoroscopic and cystoscopic guidance.  The wire was then removed with an adequate stent curl noted in the renal pelvis as well as in the bladder.  The bladder was then emptied, A new 16 French Foley catheter was inserted and the procedure ended.  The patient appeared to tolerate the procedure well and without complications.  The patient was able to be awakened and transferred to the recovery unit in satisfactory condition.

## 2015-04-12 NOTE — Progress Notes (Signed)
OT Cancellation Note  Patient Details Name: Douglas [name] MRN: [id number] DOB: 1931/12/24   Cancelled Treatment:    Reason Eval/Treat Not Completed: Patient at procedure or test/ unavailable. Pt OOB for procedure.  Almon Register [phone] 04/12/2015, 1:44 PM

## 2015-04-12 NOTE — Progress Notes (Signed)
[name] PROGRESS NOTE  Douglas [name] GGY:[id number] DOB: May 20, 1931 DOA: 04/10/2015 PCP: [name]., MD  Assessment/Plan: 1-Hyperkalemia; in setting of renal failure.  Received kayexalate. k has decreased.  Repeat labs this afternoon, stop IV bicarb.   2-Sepsis, UTI, pyelonephritis.  IV fluids. IV antibiotics.  CT abdomen showing Obstructive uropathy.  Plan to transfer to [facility] for stent placement.   3-Acute on chronic renal failure stage IV; obstructive uropathy:  Follow cr, IV fluids. Component of obstructive uropathy.  Urology consulted. Urine out put 1 L  On (4-14).  Metabolic acidosis improved. Change bicarb Gtt tp NS.  Plan for double J stent placement.  Creatinine trending down.   4-Diabetes: hold levemir. SSI.  Hypoglycemia. Added D 5 to IV fluids.   Abdominal pain; CT showed, kidney stone.   Anemia: Hgb dropped slightly 9.5 on 04/10/15-->8.6. -check FOBT given hx of upper GIB -Continue iron supplements - transfuse one unit PRBC.   Severe Protein calorie malnutrition: -consult to nutrition team -Ensure  Generalized weakness: It is most likely due to deconditioning secondary to multiple comorbidities. -pt/ot -Treat underlying problems as above transfuse one PRBC.   Code Status: Full Code.  Family Communication: care discussed with Wife.  Disposition Plan: inpatient treatment for sepsis, pyelonephritis.    Consultants: Urology  Procedures:  CT^ scan.   Antibiotics:  Ceftriaxone  HPI/Subjective: He is sleepy but wake up and answer questions.  Still with right side flan, abdominal pain.  Per wife, patient also sleep a lot at home.  Patient denies dyspnea. Feeling weak.   Objective: Filed Vitals:   04/12/15 0458  BP: 116/48  Pulse: 99  Temp: 98 F (36.7 C)  Resp: 18    Intake/Output Summary (Last 24 hours) at 04/12/15 0834 Last data filed at 04/12/15 0600  Gross per 24 hour  Intake   2385 ml  Output    700 ml  Net   1685  ml   Filed Weights   04/10/15 2007 04/11/15 2200  Weight: 55.3 kg (121 lb 14.6 oz) 55.2 kg (121 lb 11.1 oz)    Exam:   General:  Alert in no distress, ill appearing.   Cardiovascular: S 1, S 2 RRR  Respiratory: CTA  Abdomen: BS present, soft, right side tenderness.   Musculoskeletal: no edema  Data Reviewed: Basic Metabolic Panel:  Recent Labs Lab 04/10/15 1712 04/10/15 1715 04/11/15 0628 04/12/15 0440  NA 138 137 142 139  K 5.7* 5.6* 5.0 3.0*  CL 106 104 112 100  CO2  --  19 15* 24  GLUCOSE 106* 111* 63* 165*  BUN 88* 80* 74* 70*  CREATININE 5.40* 5.59* 5.30* 4.99*  CALCIUM  --  8.2* 7.5* 6.8*   Liver Function Tests:  Recent Labs Lab 04/10/15 1715 04/11/15 0628  AST 43* 59*  ALT 24 29  ALKPHOS 110 128*  BILITOT 0.5 0.2*  PROT 8.4* 7.8  ALBUMIN 2.5* 2.2*   No results for input(s): LIPASE, AMYLASE in the last 168 hours. No results for input(s): AMMONIA in the last 168 hours. CBC:  Recent Labs Lab 04/10/15 1712 04/10/15 1715 04/11/15 0628 04/12/15 0440  WBC  --  20.1* 16.1* 10.5  NEUTROABS  --  17.1*  --   --   HGB 9.5* 8.6* 8.5* 7.1*  HCT 28.0* 27.6* 26.7* 22.3*  MCV  --  88.2 88.1 86.4  PLT  --  213 168 162   Cardiac Enzymes: No results for input(s): CKTOTAL, CKMB, CKMBINDEX, TROPONINI in the last  168 hours. BNP (last 3 results)  Recent Labs  02/26/15 1726  BNP 187.0*    ProBNP (last 3 results) No results for input(s): PROBNP in the last 8760 hours.  CBG:  Recent Labs Lab 04/11/15 0842 04/11/15 1319 04/11/15 1638 04/11/15 2158 04/12/15 0744  GLUCAP 119* 102* 205* 120* 178*    Recent Results (from the past 240 hour(s))  Urine culture     Status: None   Collection Time: 04/10/15  6:06 PM  Result Value Ref Range Status   Specimen Description URINE, CATHETERIZED  Final   Special Requests NONE  Final   Colony Count   Final    75,000 COLONIES/ML Performed at [place] Performed at [employer] [facility]   Final   Report Status 04/12/2015 FINAL  Final  MRSA PCR Screening     Status: Abnormal   Collection Time: 04/10/15  8:45 PM  Result Value Ref Range Status   MRSA by PCR POSITIVE (A) NEGATIVE Final    Comment:        The GeneXpert MRSA Assay (FDA approved for NASAL specimens only), is one component of a comprehensive MRSA colonization surveillance program. It is not intended to diagnose MRSA infection nor to guide or monitor treatment for MRSA infections. RESULT CALLED TO, READ BACK BY AND VERIFIED WITH: CALLED TO RN REBECCA QIU [id number] @0026  THANEY   Culture, blood (x 2)     Status: None (Preliminary result)   Collection Time: 04/10/15  9:33 PM  Result Value Ref Range Status   Specimen Description BLOOD RIGHT ANTECUBITAL  Final   Special Requests BOTTLES DRAWN AEROBIC ONLY 4CC  Final   Culture   Final           BLOOD CULTURE RECEIVED NO GROWTH TO DATE CULTURE WILL BE HELD FOR 5 DAYS BEFORE ISSUING A FINAL NEGATIVE REPORT Performed at [facility]    Report Status PENDING  Incomplete  Culture, blood (x 2)     Status: None (Preliminary result)   Collection Time: 04/10/15  9:49 PM  Result Value Ref Range Status   Specimen Description BLOOD RIGHT HAND  Final   Special Requests BOTTLES DRAWN AEROBIC ONLY 5CC  Final   Culture   Final           BLOOD CULTURE RECEIVED NO GROWTH TO DATE CULTURE WILL BE HELD FOR 5 DAYS BEFORE ISSUING A FINAL NEGATIVE REPORT Performed at [facility]    Report Status PENDING  Incomplete     Studies: Ct Abdomen Pelvis Wo Contrast  04/11/2015   CLINICAL DATA:  Right lower quadrant pain for 3 days with elevated white blood count  EXAM: CT ABDOMEN AND PELVIS WITHOUT CONTRAST  TECHNIQUE: Multidetector CT imaging of the abdomen and pelvis was performed following the standard protocol without IV contrast.  COMPARISON:  09/18/2013  FINDINGS: Tiny bilateral pleural effusions. Heavy coronary artery calcification.  Numerous punctate  calcifications within the liver suggesting prior granulomatous disease. Spleen upper normal with a span of 12 cm. Gallbladder mildly distended at 9.7 cm with no stone identified. Stomach and pancreas appear normal. Adrenal glands appear normal.  Bilateral perinephric inflammation likely chronic with superimposed acute perinephric inflammation on the right. Severe right hydronephrosis. Renal pelvis on the right is 2.5 cm. There are 2 stones within the renal pelvis measuring about 2-3 mm each. Just beyond the ureteropelvic junction there is a 10 mm stone beyond which the ureter shows a more normal  size. No hydronephrosis on the left.  Mild diffuse bladder wall thickening. Evidence of prostatectomy defect. Small volume ascites abdomen pelvis. No abnormally dilated loops of bowel. Air-fluid levels throughout the colon.  Heavy calcification of the abdominal aorta. 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  No acute musculoskeletal findings.  IMPRESSION: Right-sided obstructive nephropathy due to 1 cm stone just beyond ureteropelvic junction fight distance of about 3.5 cm. Numerous other findings as described above.   Electronically Signed   By: Skipper Cliche M.D.   On: 04/11/2015 12:45   Dg Chest 2 View  04/10/2015   CLINICAL DATA:  Subsequent encounter for diabetes and hypertension.  EXAM: CHEST  2 VIEW  COMPARISON:  03/01/2015.  FINDINGS: Stable asymmetric elevation of the right hemidiaphragm. Pulmonary edema pattern seen previously has resolved in the interval. No edema or focal airspace consolidation on the current exam. No pleural effusion. The cardiopericardial silhouette is within normal limits for size. Imaged bony structures of the thorax are intact.  IMPRESSION: No active cardiopulmonary disease.   Electronically Signed   By: Misty Stanley M.D.   On: 04/10/2015 18:54    Scheduled Meds: . sodium chloride   Intravenous Once  . antiseptic oral rinse  7 mL Mouth Rinse  BID  . atorvastatin  40 mg Oral Daily  . cefTRIAXone (ROCEPHIN)  IV  1 g Intravenous Q24H  . feeding supplement (ENSURE)  1 Container Oral TID BM  . feeding supplement (NEPRO CARB STEADY)  237 mL Oral Q1500  . gabapentin  300 mg Oral QHS  . heparin  5,000 Units Subcutaneous 3 times per day  . insulin aspart  0-9 Units Subcutaneous TID WC  . iron polysaccharides  150 mg Oral Daily  . ketorolac  1 drop Both Eyes TID  . metoCLOPramide  5 mg Oral TID AC & HS  . saccharomyces boulardii  250 mg Oral BID  . tamsulosin  0.4 mg Oral Daily   Continuous Infusions: .  sodium bicarbonate  infusion 1000 mL 100 mL/hr at 04/12/15 7209    Principal Problem:   Hyperkalemia Active Problems:   Hypertension   Hyperlipidemia   Anemia, chronic disease   Gastroparesis due to DM: DELAYED GASTRIC EMPTYING STUDY 02/08/15   Sepsis secondary to UTI   UTI (lower urinary tract infection)   C. difficile colitis   Protein-calorie malnutrition, severe   Acute renal failure superimposed on stage 5 chronic kidney disease   Abdominal pain   Generalized weakness    Time spent: 35 minutes.     Niel Hummer A  [name] Pager [phone] If 7PM-7AM, please contact night-coverage at [email or web address], password [facility] 04/12/2015, 8:34 AM  LOS: 2 days

## 2015-04-12 NOTE — Evaluation (Signed)
Physical Therapy Evaluation Patient Details Name: Douglas [name] MRN: [id number] DOB: March 11, 1931 Today's Date: 04/12/2015   History of Present Illness  Patient is an 81 y/o male who had right femur fracure with IM nail in January this year.  Since has had difficulty with urinary retention with history of prostate cancer and recent UTI, C-diff colitis.  Now with Acute on chronic renal failure stage IV; obstructive uropathy and urosepsis with pyelonephritis.  For retrugrade pyelogram and double J stent today.  Clinical Impression  Patient presents with continued limitations due to medical issues including orthostatic hypotension.  Also limited treatment today due to planned procedure out of facility.  Will need to resume HHPT at d/c.  Patient will benefit from skilled PT during acute stay to maximize mobility for d/c back home.    Follow Up Recommendations Home health PT    Equipment Recommendations  None recommended by PT    Recommendations for Other Services       Precautions / Restrictions Precautions Precautions: Fall Precaution Comments: orthostatic Restrictions RLE Weight Bearing: Weight bearing as tolerated      Mobility  Bed Mobility Overal bed mobility: Needs Assistance Bed Mobility: Rolling;Sidelying to Sit;Sit to Supine Rolling: Supervision Sidelying to sit: Min guard   Sit to supine: Min assist   General bed mobility comments: Heavy reliance on rail for rolling with cues; assist to support trunk coming up to sit; then to supine, assist for LE to prevent it falling off bed; cues for proper positioning in supine  Transfers                 General transfer comment: NT due to orthostasis; pt with planned procedure today out of facility  Ambulation/Gait                Stairs            Wheelchair Mobility    Modified Rankin (Stroke Patients Only)       Balance Overall balance assessment: Needs assistance Sitting-balance support: Feet  supported Sitting balance-Leahy Scale: Fair Sitting balance - Comments: initially needed min support for balance, but once feet on floor improved (limited due to unlevel bed)                                     Pertinent Vitals/Pain Pain Assessment: No/denies pain    Home Living Family/patient expects to be discharged to:: Private residence Living Arrangements: Spouse/significant other Available Help at Discharge: Available 24 hours/day;Home health;Family;Friend(s);Neighbor Type of Home: House Home Access: [place]: One [place]: Environmental consultant - 2 wheels;Cane - single point;Tub bench;Wheelchair - manual;Hospital bed      Prior Function Level of Independence: Needs assistance   Gait / Transfers Assistance Needed: not functionally ambulatory since right hip fracture due to multiple illnesses  ADL's / Homemaking Assistance Needed: Pt wife & children assist in all B/IADLs. [facility] provides PT/OT/RN home health services        Hand Dominance   Dominant Hand: Right    Extremity/Trunk Assessment               Lower Extremity Assessment: Generalized weakness (ROM WFL)         Communication   Communication: No difficulties  Cognition Arousal/Alertness: Lethargic Behavior During Therapy: Flat affect Overall Cognitive Status: Within Functional Limits for tasks assessed  General Comments General comments (skin integrity, edema, etc.): Discussed with pt/wife need for sitting up with HOB higher to help improve upright tolerance.    Exercises General Exercises - Lower Extremity Ankle Circles/Pumps: AROM;Both;10 reps Heel Slides: AROM;Both;10 reps      Assessment/Plan    PT Assessment Patient needs continued PT services  PT Diagnosis Generalized weakness   PT Problem List Decreased strength;Decreased mobility;Decreased activity tolerance;Decreased balance;Cardiopulmonary status  limiting activity  PT Treatment Interventions DME instruction;Therapeutic activities;Therapeutic exercise;Patient/family education;Functional mobility training;Wheelchair mobility training   PT Goals (Current goals can be found in the Care Plan section) Acute Rehab PT Goals Patient Stated Goal: return home PT Goal Formulation: With patient/family Time For Goal Achievement: 04/26/15 Potential to Achieve Goals: Good    Frequency Min 3X/week   Barriers to discharge        Co-evaluation               End of Session Equipment Utilized During Treatment: Gait belt Activity Tolerance: Treatment limited secondary to medical complications (Comment) (due to decreased BP) Patient left: in bed;with call bell/phone within reach;with family/visitor present           Time: 1884-1660 PT Time Calculation (min) (ACUTE ONLY): 21 min   Charges:   PT Evaluation $Initial PT Evaluation Tier I: 1 Procedure     PT G Codes:        Jeffrey Graefe,CYNDI 04/17/15, 10:16 AM  Magda Kiel, [place] 2015-04-17

## 2015-04-12 NOTE — Anesthesia Procedure Notes (Signed)
Procedure Name: LMA Insertion Performed by: Ofilia Neas Pre-anesthesia Checklist: Patient identified, Emergency Drugs available, Suction available, Patient being monitored and Timeout performed Patient Re-evaluated:Patient Re-evaluated prior to inductionOxygen Delivery Method: Circle system utilized Preoxygenation: Pre-oxygenation with 100% oxygen Intubation Type: IV induction LMA: LMA inserted LMA Size: 4.0 Number of attempts: 1 Placement Confirmation: positive ETCO2 Dental Injury: Teeth and Oropharynx as per pre-operative assessment

## 2015-04-13 LAB — BASIC METABOLIC PANEL
Anion gap: 13 (ref 5–15)
BUN: 62 mg/dL — ABNORMAL HIGH (ref 6–23)
CALCIUM: 6.4 mg/dL — AB (ref 8.4–10.5)
CO2: 22 mmol/L (ref 19–32)
Chloride: 104 mmol/L (ref 96–112)
Creatinine, Ser: 4.44 mg/dL — ABNORMAL HIGH (ref 0.50–1.35)
GFR calc Af Amer: 13 mL/min — ABNORMAL LOW (ref >90)
GFR calc non Af Amer: 11 mL/min — ABNORMAL LOW (ref >90)
GLUCOSE: 198 mg/dL — AB (ref 70–99)
POTASSIUM: 3.2 mmol/L — AB (ref 3.5–5.1)
Sodium: 139 mmol/L (ref 135–145)

## 2015-04-13 LAB — GLUCOSE, CAPILLARY
GLUCOSE-CAPILLARY: 174 mg/dL — AB (ref 70–99)
Glucose-Capillary: 190 mg/dL — ABNORMAL HIGH (ref 70–99)
Glucose-Capillary: 160 mg/dL — ABNORMAL HIGH (ref 70–99)
Glucose-Capillary: 193 mg/dL — ABNORMAL HIGH (ref 70–99)
Glucose-Capillary: 135 mg/dL — ABNORMAL HIGH (ref 70–99)

## 2015-04-13 LAB — CBC
HCT: 25.3 % — ABNORMAL LOW (ref 39.0–52.0)
HEMOGLOBIN: 8.1 g/dL — AB (ref 13.0–17.0)
MCH: 27.6 pg (ref 26.0–34.0)
MCHC: 32 g/dL (ref 30.0–36.0)
MCV: 86.1 fL (ref 78.0–100.0)
PLATELETS: 145 10*3/uL — AB (ref 150–400)
RBC: 2.94 MIL/uL — ABNORMAL LOW (ref 4.22–5.81)
RDW: 15.8 % — AB (ref 11.5–15.5)
WBC: 7.8 10*3/uL (ref 4.0–10.5)

## 2015-04-13 LAB — CLOSTRIDIUM DIFFICILE BY PCR: CDIFFPCR: POSITIVE — AB

## 2015-04-13 MED ORDER — SODIUM CHLORIDE 0.9 % IV SOLN
INTRAVENOUS | Status: DC
  Administered 2015-04-13: 18:00:00 via INTRAVENOUS
  Administered 2015-04-14: 100 mL/h via INTRAVENOUS
  Administered 2015-04-15: 50 mL/h via INTRAVENOUS

## 2015-04-13 MED ORDER — VANCOMYCIN 50 MG/ML ORAL SOLUTION
125.0000 mg | Freq: Four times a day (QID) | ORAL | Status: DC
  Administered 2015-04-13 – 2015-04-19 (×24): 125 mg via ORAL
  Filled 2015-04-13 (×25): qty 2.5

## 2015-04-13 MED ORDER — CALCIUM CARBONATE ANTACID 500 MG PO CHEW
400.0000 mg | CHEWABLE_TABLET | Freq: Once | ORAL | Status: AC
  Administered 2015-04-13: 400 mg via ORAL
  Filled 2015-04-13: qty 1

## 2015-04-13 MED ORDER — POTASSIUM CHLORIDE CRYS ER 20 MEQ PO TBCR
20.0000 meq | EXTENDED_RELEASE_TABLET | Freq: Once | ORAL | Status: AC
  Administered 2015-04-13: 20 meq via ORAL
  Filled 2015-04-13: qty 1

## 2015-04-13 NOTE — Progress Notes (Signed)
CRITICAL VALUE ALERT  Critical value received:  C. Dif PCR - positive  Date of notification:  04/13/15  Time of notification:  8:44PM  Critical value read back:Yes.    Nurse who received alert:  A. Aleina Burgio, RN  MD notified (1st page): M. Lynch  Time of first page: 9:13pm  MD notified (2nd page):  Time of second page:  Responding MD:  M. Lynch  Time MD responded:  9:15pm

## 2015-04-13 NOTE — Progress Notes (Signed)
No post op stent issues Continue to treat all positive c/s post op F/up with Dr Jeffie Pollock or Dr Karsten Ro

## 2015-04-13 NOTE — Progress Notes (Signed)
Received a call from micro lab stating that pt is positive for C. Dif. Enteric Precaution measures has been implemented. ON-call M. Lynch paged and notified. Will continue to monitor.

## 2015-04-13 NOTE — Progress Notes (Signed)
[name] PROGRESS NOTE  Douglas [name] OIN:[id number] DOB: [birth year] DOA: 04/10/2015 PCP: [name]., MD  Assessment/Plan: 81 year old with PMH significant for prostate cancer 2009 (post status of radiation therapy), gastroparesis, chronic kidney disease-IV-V, history of C. difficile colitis (completed treatment, repeated C. difficile PCR negative on 03/17/15), who presents with generalized weakness and abdominal pain. Recent treated for UTI.  In ED, patient was found to have potassium of 5.6 without T wave peaking on EKG, AoCKD-IV-V, creatinine up from baseline 2.0-3.0 to 5.59, WBC 20.1, tachycardia, temperature 98.1, urinalysis is positive for UTI. Chest x-ray is negative for acute abnormalities. CT abdomen pelvis showed obstructive uropathy.   1-Hyperkalemia; in setting of renal failure.  Received kayexalate. k has decreased.  Repeat labs this afternoon, stop IV bicarb.  Now with hypokalemia.   2-Sepsis, UTI, pyelonephritis.  IV fluids. IV antibiotics.  CT abdomen showing Obstructive uropathy.  S/P  double J stent placement.  Urine culture growing yeast. Started on fluconazole.  Anaerobic culture pending.  Day 4 of ceftriaxone.   3-Acute on chronic renal failure stage IV; obstructive uropathy:  Follow cr, IV fluids. Component of obstructive uropathy.  Urology consulted. Urine out put 850  On (4-15).  Metabolic acidosis improved. Change bicarb Gtt tp NS.  S/P double J stent placement.  Creatinine trending down. Replete potassium/   4-Diabetes: hold levemir. SSI.  Hypoglycemia. Resolved. Change fluid to NS.   5-Abdominal pain; CT showed, kidney stone.  Resolved.   6-Anemia:  -check FOBT given hx of upper GIB -Continue iron supplements - S/P one unit PRBC 4-15. Hb increase to 8 from 7.   7-Severe Protein calorie malnutrition: -consult to nutrition team -Ensure  8-Generalized weakness: It is most likely due to deconditioning secondary to multiple  comorbidities. -pt/ot -Treat underlying problems as above -S/P transfusion of  one PRBC.   9-episode of diarrhea; last night. I don't see culture for C diff. Will ask nurse to find out results.   Code Status: Full Code.  Family Communication: care discussed with Wife.  Disposition Plan: inpatient treatment for sepsis, pyelonephritis.    Consultants: Urology  Procedures:  CT^ scan.   Antibiotics:  Ceftriaxone  HPI/Subjective: He is sleepy but wake up and answer questions.  Abdominal pain has resolved.  Per wife, patient also sleep a lot at home.  He is feeling better.    Objective: Filed Vitals:   04/13/15 0900  BP: 135/67  Pulse: 106  Temp: 98 F (36.7 C)  Resp: 18    Intake/Output Summary (Last 24 hours) at 04/13/15 1417 Last data filed at 04/13/15 1007  Gross per 24 hour  Intake   3697 ml  Output    601 ml  Net   3096 ml   Filed Weights   04/10/15 2007 04/11/15 2200 04/12/15 2220  Weight: 55.3 kg (121 lb 14.6 oz) 55.2 kg (121 lb 11.1 oz) 56 kg (123 lb 7.3 oz)    Exam:   General:  Alert in no distress, ill appearing.   Cardiovascular: S 1, S 2 RRR  Respiratory: CTA  Abdomen: BS present, soft, right side tenderness.   Musculoskeletal: no edema  Data Reviewed: Basic Metabolic Panel:  Recent Labs Lab 04/10/15 1715 04/11/15 0628 04/12/15 0440 04/12/15 2100 04/13/15 0440  NA 137 142 139 138 139  K 5.6* 5.0 3.0* 3.2* 3.2*  CL 104 112 100 102 104  CO2 19 15* 24 23 22   GLUCOSE 111* 63* 165* 217* 198*  BUN 80* 74* 70*  64* 62*  CREATININE 5.59* 5.30* 4.99* 4.64* 4.44*  CALCIUM 8.2* 7.5* 6.8* 6.4* 6.4*   Liver Function Tests:  Recent Labs Lab 04/10/15 1715 04/11/15 0628  AST 43* 59*  ALT 24 29  ALKPHOS 110 128*  BILITOT 0.5 0.2*  PROT 8.4* 7.8  ALBUMIN 2.5* 2.2*   No results for input(s): LIPASE, AMYLASE in the last 168 hours. No results for input(s): AMMONIA in the last 168 hours. CBC:  Recent Labs Lab 04/10/15 1712  04/10/15 1715 04/11/15 0628 04/12/15 0440 04/13/15 0440  WBC  --  20.1* 16.1* 10.5 7.8  NEUTROABS  --  17.1*  --   --   --   HGB 9.5* 8.6* 8.5* 7.1* 8.1*  HCT 28.0* 27.6* 26.7* 22.3* 25.3*  MCV  --  88.2 88.1 86.4 86.1  PLT  --  213 168 162 145*   Cardiac Enzymes: No results for input(s): CKTOTAL, CKMB, CKMBINDEX, TROPONINI in the last 168 hours. BNP (last 3 results)  Recent Labs  02/26/15 1726  BNP 187.0*    ProBNP (last 3 results) No results for input(s): PROBNP in the last 8760 hours.  CBG:  Recent Labs Lab 04/12/15 1124 04/12/15 1627 04/12/15 2217 04/13/15 0807 04/13/15 1202  GLUCAP 146* 161* 190* 160* 193*    Recent Results (from the past 240 hour(s))  Urine culture     Status: None   Collection Time: 04/10/15  6:06 PM  Result Value Ref Range Status   Specimen Description URINE, CATHETERIZED  Final   Special Requests NONE  Final   Colony Count   Final    75,000 COLONIES/ML Performed at [place] Performed at [facility]   Final   Report Status 04/12/2015 FINAL  Final  MRSA PCR Screening     Status: Abnormal   Collection Time: 04/10/15  8:45 PM  Result Value Ref Range Status   MRSA by PCR POSITIVE (A) NEGATIVE Final    Comment:        The GeneXpert MRSA Assay (FDA approved for NASAL specimens only), is one component of a comprehensive MRSA colonization surveillance program. It is not intended to diagnose MRSA infection nor to guide or monitor treatment for MRSA infections. RESULT CALLED TO, READ BACK BY AND VERIFIED WITH: CALLED TO RN REBECCA QIU [id number] @0026  THANEY   Culture, blood (x 2)     Status: None (Preliminary result)   Collection Time: 04/10/15  9:33 PM  Result Value Ref Range Status   Specimen Description BLOOD RIGHT ANTECUBITAL  Final   Special Requests BOTTLES DRAWN AEROBIC ONLY 4CC  Final   Culture   Final           BLOOD CULTURE RECEIVED NO GROWTH TO DATE CULTURE WILL BE HELD FOR 5 DAYS  BEFORE ISSUING A FINAL NEGATIVE REPORT Performed at [facility]    Report Status PENDING  Incomplete  Culture, blood (x 2)     Status: None (Preliminary result)   Collection Time: 04/10/15  9:49 PM  Result Value Ref Range Status   Specimen Description BLOOD RIGHT HAND  Final   Special Requests BOTTLES DRAWN AEROBIC ONLY 5CC  Final   Culture   Final           BLOOD CULTURE RECEIVED NO GROWTH TO DATE CULTURE WILL BE HELD FOR 5 DAYS BEFORE ISSUING A FINAL NEGATIVE REPORT Performed at [facility]    Report Status PENDING  Incomplete  Anaerobic culture  Status: None (Preliminary result)   Collection Time: 04/12/15  1:57 PM  Result Value Ref Range Status   Specimen Description   Final    URINE, CATHETERIZED RIGHT RENAL PELVIS Performed at [place] Requests   Final    PATIENT ON FOLLOWING ROCEPHIN Performed at [facility]    Gram Stain PENDING  Incomplete   Culture   Final    NO ANAEROBES ISOLATED; CULTURE IN PROGRESS FOR 5 DAYS Performed at [facility]    Report Status PENDING  Incomplete     Studies: No results found.  Scheduled Meds: . antiseptic oral rinse  7 mL Mouth Rinse BID  . atorvastatin  40 mg Oral Daily  . cefTRIAXone (ROCEPHIN)  IV  1 g Intravenous Q24H  . feeding supplement (ENSURE)  1 Container Oral TID BM  . feeding supplement (NEPRO CARB STEADY)  237 mL Oral Q1500  . fluconazole (DIFLUCAN) IV  200 mg Intravenous Q24H  . gabapentin  300 mg Oral QHS  . heparin  5,000 Units Subcutaneous 3 times per day  . insulin aspart  0-9 Units Subcutaneous TID WC  . iron polysaccharides  150 mg Oral Daily  . ketorolac  1 drop Both Eyes TID  . metoCLOPramide  5 mg Oral TID AC & HS  . saccharomyces boulardii  250 mg Oral BID  . tamsulosin  0.4 mg Oral Daily   Continuous Infusions: . dextrose 5 % and 0.9% NaCl 100 mL/hr at 04/13/15 0076    Principal Problem:   Hyperkalemia Active  Problems:   Hypertension   Hyperlipidemia   Anemia, chronic disease   Gastroparesis due to DM: DELAYED GASTRIC EMPTYING STUDY 02/08/15   Sepsis secondary to UTI   UTI (lower urinary tract infection)   C. difficile colitis   Protein-calorie malnutrition, severe   Acute renal failure superimposed on stage 5 chronic kidney disease   Abdominal pain   Generalized weakness    Time spent: 35 minutes.     Niel Hummer A  [name] Pager [phone] If 7PM-7AM, please contact night-coverage at [email or web address], password [facility] 04/13/2015, 2:17 PM  LOS: 3 days

## 2015-04-14 LAB — URINE CULTURE: Colony Count: >=100000

## 2015-04-14 LAB — CBC
HEMATOCRIT: 23.9 % — AB (ref 39.0–52.0)
HEMOGLOBIN: 7.6 g/dL — AB (ref 13.0–17.0)
MCH: 27.9 pg (ref 26.0–34.0)
MCHC: 31.8 g/dL (ref 30.0–36.0)
MCV: 87.9 fL (ref 78.0–100.0)
PLATELETS: 127 10*3/uL — AB (ref 150–400)
RBC: 2.72 MIL/uL — ABNORMAL LOW (ref 4.22–5.81)
RDW: 16 % — AB (ref 11.5–15.5)
WBC: 6.3 10*3/uL (ref 4.0–10.5)

## 2015-04-14 LAB — GLUCOSE, CAPILLARY
Glucose-Capillary: 141 mg/dL — ABNORMAL HIGH (ref 70–99)
Glucose-Capillary: 138 mg/dL — ABNORMAL HIGH (ref 70–99)
Glucose-Capillary: 134 mg/dL — ABNORMAL HIGH (ref 70–99)

## 2015-04-14 LAB — BASIC METABOLIC PANEL
Anion gap: 12 (ref 5–15)
BUN: 53 mg/dL — ABNORMAL HIGH (ref 6–23)
CO2: 21 mmol/L (ref 19–32)
Calcium: 6.2 mg/dL — CL (ref 8.4–10.5)
Chloride: 104 mmol/L (ref 96–112)
Creatinine, Ser: 3.92 mg/dL — ABNORMAL HIGH (ref 0.50–1.35)
GFR calc Af Amer: 15 mL/min — ABNORMAL LOW (ref >90)
GFR calc non Af Amer: 13 mL/min — ABNORMAL LOW (ref >90)
Glucose, Bld: 115 mg/dL — ABNORMAL HIGH (ref 70–99)
Potassium: 3.6 mmol/L (ref 3.5–5.1)
Sodium: 137 mmol/L (ref 135–145)

## 2015-04-14 LAB — PREPARE RBC (CROSSMATCH)

## 2015-04-14 MED ORDER — SODIUM CHLORIDE 0.9 % IV SOLN
Freq: Once | INTRAVENOUS | Status: AC
  Administered 2015-04-14: 14:00:00 via INTRAVENOUS

## 2015-04-14 MED ORDER — CALCIUM CARBONATE ANTACID 500 MG PO CHEW
400.0000 mg | CHEWABLE_TABLET | Freq: Once | ORAL | Status: AC
  Administered 2015-04-14: 400 mg via ORAL
  Filled 2015-04-14: qty 1
  Filled 2015-04-14: qty 2

## 2015-04-14 MED ORDER — FUROSEMIDE 10 MG/ML IJ SOLN
20.0000 mg | Freq: Once | INTRAMUSCULAR | Status: AC
  Administered 2015-04-14: 20 mg via INTRAVENOUS
  Filled 2015-04-14: qty 2

## 2015-04-14 MED ORDER — CALCIUM CARBONATE-VITAMIN D 500-200 MG-UNIT PO TABS
1.0000 | ORAL_TABLET | Freq: Two times a day (BID) | ORAL | Status: DC
  Administered 2015-04-14 – 2015-04-19 (×11): 1 via ORAL
  Filled 2015-04-14 (×12): qty 1

## 2015-04-14 NOTE — Progress Notes (Signed)
CRITICAL VALUE ALERT  Critical value received: Calcium-6.2  Date of notification:  04/14/2015  Time of notification:  0640  Critical value read back:Yes.    Nurse who received alert:  A. Amadeus Oyama, RN  MD notified (1st page):  M. Lynch  Time of first page:  [phone]  MD notified (2nd page):  Time of second page:  Responding MD:  M. Donnal Debar  Time MD responded: [phone]

## 2015-04-14 NOTE — Progress Notes (Signed)
[name] PROGRESS NOTE  Douglas [name] EXN:[id number] DOB: [birth year] DOA: 04/10/2015 PCP: [name]., MD  Assessment/Plan: 81 year old with PMH significant for prostate cancer 2009 (post status of radiation therapy), gastroparesis, chronic kidney disease-IV-V, history of C. difficile colitis (completed treatment, repeated C. difficile PCR negative on 03/17/15), who presents with generalized weakness and abdominal pain. Recent treated for UTI.  In ED, patient was found to have potassium of 5.6 without T wave peaking on EKG, AoCKD-IV-V, creatinine up from baseline 2.0-3.0 to 5.59, WBC 20.1, tachycardia, temperature 98.1, urinalysis is positive for UTI. Chest x-ray is negative for acute abnormalities. CT abdomen pelvis showed obstructive uropathy.   1-C diff colitis:  C diff positive.  Started on oral vancomycin on 4-16.    2-Sepsis, UTI, pyelonephritis.  IV fluids. IV antibiotics.  CT abdomen showing Obstructive uropathy.  S/P  double J stent placement.  Urine culture growing yeast. Continue with  fluconazole.  Anaerobic culture pending.  Received ceftriaxone for 3 days. Urine culture only growing yeast.   3-Acute on chronic renal failure stage IV; obstructive uropathy: Cr baseline 2 to 3.  Follow cr, IV fluids. Component of obstructive uropathy.   Urology consulted. Urine out put 850  On (4-15).  Metabolic acidosis improved. Change bicarb Gtt tp NS.  S/P double J stent placement.  Creatinine trending down. Today at 3.9 Calcium corrected by albumin at : 7.6. Will start supplement.   4-Diabetes: hold levemir. SSI.  Hypoglycemia. Resolved.   5-Abdominal pain; CT showed, kidney stone.  Resolved.   6-Anemia:  -check FOBT given hx of upper GIB -Continue iron supplements - S/P one unit PRBC 4-15.  -hb at 7.6, still very weak. Will transfuse another unit PRBC.   7-Severe Protein calorie malnutrition: -consult to nutrition team -Ensure  8-Generalized weakness: It is  most likely due to deconditioning secondary to multiple comorbidities. -pt/ot -Treat underlying problems as above -S/P transfusion of  one PRBC.  -Check TSH.   9-Hyperkalemia; in setting of renal failure.  Received kayexalate. k has decreased.  Resolved.   Code Status: DNR Family Communication: care discussed with Wife.  Disposition Plan: inpatient treatment for sepsis, pyelonephritis.    Consultants: Urology  Procedures:  CT^ scan.   Antibiotics:  Ceftriaxone-stop 4-17  Fluconazole  Oral vancomycin 4-16  HPI/Subjective: Not feeling well, feeling weak.  Per wife, patient also sleep a lot at home.     Objective: Filed Vitals:   04/14/15 1102  BP: 114/63  Pulse: 101  Temp: 97.6 F (36.4 C)  Resp: 16    Intake/Output Summary (Last 24 hours) at 04/14/15 1310 Last data filed at 04/14/15 0810  Gross per 24 hour  Intake 2168.33 ml  Output    502 ml  Net 1666.33 ml   Filed Weights   04/11/15 2200 04/12/15 2220 04/13/15 2119  Weight: 55.2 kg (121 lb 11.1 oz) 56 kg (123 lb 7.3 oz) 56.4 kg (124 lb 5.4 oz)    Exam:   General:  Lethargic, wake up , say few words.   Cardiovascular: S 1, S 2 RRR  Respiratory: CTA  Abdomen: BS present, soft, right side tenderness.   Musculoskeletal: no edema  Data Reviewed: Basic Metabolic Panel:  Recent Labs Lab 04/11/15 0628 04/12/15 0440 04/12/15 2100 04/13/15 0440 04/14/15 0423  NA 142 139 138 139 137  K 5.0 3.0* 3.2* 3.2* 3.6  CL 112 100 102 104 104  CO2 15* 24 23 22 21   GLUCOSE 63* 165* 217* 198* 115*  BUN 74* 70* 64* 62* 53*  CREATININE 5.30* 4.99* 4.64* 4.44* 3.92*  CALCIUM 7.5* 6.8* 6.4* 6.4* 6.2*   Liver Function Tests:  Recent Labs Lab 04/10/15 1715 04/11/15 0628  AST 43* 59*  ALT 24 29  ALKPHOS 110 128*  BILITOT 0.5 0.2*  PROT 8.4* 7.8  ALBUMIN 2.5* 2.2*   No results for input(s): LIPASE, AMYLASE in the last 168 hours. No results for input(s): AMMONIA in the last 168  hours. CBC:  Recent Labs Lab 04/10/15 1715 04/11/15 0628 04/12/15 0440 04/13/15 0440 04/14/15 0423  WBC 20.1* 16.1* 10.5 7.8 6.3  NEUTROABS 17.1*  --   --   --   --   HGB 8.6* 8.5* 7.1* 8.1* 7.6*  HCT 27.6* 26.7* 22.3* 25.3* 23.9*  MCV 88.2 88.1 86.4 86.1 87.9  PLT 213 168 162 145* 127*   Cardiac Enzymes: No results for input(s): CKTOTAL, CKMB, CKMBINDEX, TROPONINI in the last 168 hours. BNP (last 3 results)  Recent Labs  02/26/15 1726  BNP 187.0*    ProBNP (last 3 results) No results for input(s): PROBNP in the last 8760 hours.  CBG:  Recent Labs Lab 04/13/15 0807 04/13/15 1202 04/13/15 1648 04/13/15 2123 04/14/15 0830  GLUCAP 160* 193* 174* 135* 141*    Recent Results (from the past 240 hour(s))  Urine culture     Status: None   Collection Time: 04/10/15  6:06 PM  Result Value Ref Range Status   Specimen Description URINE, CATHETERIZED  Final   Special Requests NONE  Final   Colony Count   Final    75,000 COLONIES/ML Performed at [place] Performed at [facility]   Final   Report Status 04/12/2015 FINAL  Final  MRSA PCR Screening     Status: Abnormal   Collection Time: 04/10/15  8:45 PM  Result Value Ref Range Status   MRSA by PCR POSITIVE (A) NEGATIVE Final    Comment:        The GeneXpert MRSA Assay (FDA approved for NASAL specimens only), is one component of a comprehensive MRSA colonization surveillance program. It is not intended to diagnose MRSA infection nor to guide or monitor treatment for MRSA infections. RESULT CALLED TO, READ BACK BY AND VERIFIED WITH: CALLED TO RN REBECCA QIU [id number] @0026  THANEY   Culture, blood (x 2)     Status: None (Preliminary result)   Collection Time: 04/10/15  9:33 PM  Result Value Ref Range Status   Specimen Description BLOOD RIGHT ANTECUBITAL  Final   Special Requests BOTTLES DRAWN AEROBIC ONLY 4CC  Final   Culture   Final           BLOOD CULTURE RECEIVED NO  GROWTH TO DATE CULTURE WILL BE HELD FOR 5 DAYS BEFORE ISSUING A FINAL NEGATIVE REPORT Performed at [facility]    Report Status PENDING  Incomplete  Culture, blood (x 2)     Status: None (Preliminary result)   Collection Time: 04/10/15  9:49 PM  Result Value Ref Range Status   Specimen Description BLOOD RIGHT HAND  Final   Special Requests BOTTLES DRAWN AEROBIC ONLY 5CC  Final   Culture   Final           BLOOD CULTURE RECEIVED NO GROWTH TO DATE CULTURE WILL BE HELD FOR 5 DAYS BEFORE ISSUING A FINAL NEGATIVE REPORT Performed at [facility]    Report Status PENDING  Incomplete  Anaerobic culture  Status: None (Preliminary result)   Collection Time: 04/12/15  1:57 PM  Result Value Ref Range Status   Specimen Description   Final    URINE, CATHETERIZED RIGHT RENAL PELVIS Performed at [place] Requests   Final    PATIENT ON FOLLOWING ROCEPHIN Performed at [facility]    Gram Stain PENDING  Incomplete   Culture   Final    NO ANAEROBES ISOLATED; CULTURE IN PROGRESS FOR 5 DAYS Performed at [facility]    Report Status PENDING  Incomplete  Clostridium Difficile by PCR     Status: Abnormal   Collection Time: 04/13/15  5:56 PM  Result Value Ref Range Status   C difficile by pcr POSITIVE (A) NEGATIVE Final    Comment: CRITICAL RESULT CALLED TO, READ BACK BY AND VERIFIED WITH: A.MACROHON,RN 04/13/15 @2010  BY V.WILKINS      Studies: No results found.  Scheduled Meds: . sodium chloride   Intravenous Once  . antiseptic oral rinse  7 mL Mouth Rinse BID  . atorvastatin  40 mg Oral Daily  . feeding supplement (ENSURE)  1 Container Oral TID BM  . feeding supplement (NEPRO CARB STEADY)  237 mL Oral Q1500  . fluconazole (DIFLUCAN) IV  200 mg Intravenous Q24H  . furosemide  20 mg Intravenous Once  . gabapentin  300 mg Oral QHS  . heparin  5,000 Units Subcutaneous 3 times per day  . insulin aspart  0-9 Units  Subcutaneous TID WC  . iron polysaccharides  150 mg Oral Daily  . ketorolac  1 drop Both Eyes TID  . metoCLOPramide  5 mg Oral TID AC & HS  . tamsulosin  0.4 mg Oral Daily  . vancomycin  125 mg Oral QID   Continuous Infusions: . sodium chloride 50 mL/hr at 04/14/15 1210    Principal Problem:   Hyperkalemia Active Problems:   Hypertension   Hyperlipidemia   Anemia, chronic disease   Gastroparesis due to DM: DELAYED GASTRIC EMPTYING STUDY 02/08/15   Sepsis secondary to UTI   UTI (lower urinary tract infection)   C. difficile colitis   Protein-calorie malnutrition, severe   Acute renal failure superimposed on stage 5 chronic kidney disease   Abdominal pain   Generalized weakness    Time spent: 35 minutes.     Niel Hummer A  [name] Pager [phone] If 7PM-7AM, please contact night-coverage at [email or web address], password [facility] 04/14/2015, 1:10 PM  LOS: 4 days

## 2015-04-15 ENCOUNTER — Encounter (HOSPITAL_COMMUNITY): Payer: Self-pay | Admitting: Urology

## 2015-04-15 LAB — ABO/RH: ABO/RH(D): A POS

## 2015-04-15 LAB — CBC
HEMATOCRIT: 33.4 % — AB (ref 39.0–52.0)
HEMOGLOBIN: 10.5 g/dL — AB (ref 13.0–17.0)
MCH: 27.5 pg (ref 26.0–34.0)
MCHC: 31.4 g/dL (ref 30.0–36.0)
MCV: 87.4 fL (ref 78.0–100.0)
Platelets: 122 10*3/uL — ABNORMAL LOW (ref 150–400)
RBC: 3.82 MIL/uL — ABNORMAL LOW (ref 4.22–5.81)
RDW: 16.3 % — AB (ref 11.5–15.5)
WBC: 8.4 10*3/uL (ref 4.0–10.5)

## 2015-04-15 LAB — BASIC METABOLIC PANEL
Anion gap: 13 (ref 5–15)
BUN: 51 mg/dL — ABNORMAL HIGH (ref 6–23)
CO2: 19 mmol/L (ref 19–32)
Calcium: 6.8 mg/dL — ABNORMAL LOW (ref 8.4–10.5)
Chloride: 106 mmol/L (ref 96–112)
Creatinine, Ser: 3.77 mg/dL — ABNORMAL HIGH (ref 0.50–1.35)
GFR calc Af Amer: 16 mL/min — ABNORMAL LOW (ref >90)
GFR calc non Af Amer: 14 mL/min — ABNORMAL LOW (ref >90)
GLUCOSE: 115 mg/dL — AB (ref 70–99)
POTASSIUM: 4.3 mmol/L (ref 3.5–5.1)
Sodium: 138 mmol/L (ref 135–145)

## 2015-04-15 LAB — TYPE AND SCREEN
ABO/RH(D): A POS
Antibody Screen: NEGATIVE
UNIT DIVISION: 0

## 2015-04-15 LAB — TSH: TSH: 4.777 u[IU]/mL — AB (ref 0.350–4.500)

## 2015-04-15 LAB — GLUCOSE, CAPILLARY
GLUCOSE-CAPILLARY: 125 mg/dL — AB (ref 70–99)
GLUCOSE-CAPILLARY: 106 mg/dL — AB (ref 70–99)
GLUCOSE-CAPILLARY: 137 mg/dL — AB (ref 70–99)
GLUCOSE-CAPILLARY: 122 mg/dL — AB (ref 70–99)
GLUCOSE-CAPILLARY: 111 mg/dL — AB (ref 70–99)

## 2015-04-15 MED ORDER — CHLORHEXIDINE GLUCONATE CLOTH 2 % EX PADS
6.0000 | MEDICATED_PAD | Freq: Every day | CUTANEOUS | Status: DC
  Administered 2015-04-16 – 2015-04-19 (×4): 6 via TOPICAL

## 2015-04-15 MED ORDER — FUROSEMIDE 10 MG/ML IJ SOLN
40.0000 mg | Freq: Once | INTRAMUSCULAR | Status: AC
  Administered 2015-04-15: 40 mg via INTRAVENOUS
  Filled 2015-04-15: qty 4

## 2015-04-15 MED ORDER — COLLAGENASE 250 UNIT/GM EX OINT
TOPICAL_OINTMENT | Freq: Every day | CUTANEOUS | Status: DC
  Administered 2015-04-15 – 2015-04-19 (×5): via TOPICAL
  Filled 2015-04-15: qty 30

## 2015-04-15 MED ORDER — FLUCONAZOLE 100 MG PO TABS
100.0000 mg | ORAL_TABLET | ORAL | Status: AC
  Administered 2015-04-15 – 2015-04-18 (×4): 100 mg via ORAL
  Filled 2015-04-15 (×4): qty 1

## 2015-04-15 MED ORDER — MUPIROCIN 2 % EX OINT
1.0000 "application " | TOPICAL_OINTMENT | Freq: Two times a day (BID) | CUTANEOUS | Status: DC
  Administered 2015-04-15 – 2015-04-19 (×8): 1 via NASAL
  Filled 2015-04-15: qty 22

## 2015-04-15 NOTE — Progress Notes (Signed)
OT Cancellation Note  Patient Details Name: Douglas [name] MRN: [id number] DOB: May 25, 1931   Cancelled Treatment:    Reason Eval/Treat Not Completed: Fatigue/lethargy limiting ability to participate. Pt stating he is too fatigued to get up with OT. He states he was up to EOB with PT earlier and is worn out. Pt even dozing off while OT speaking to him. Wife present and requesting that OT check back but asking if PT and OT can be spread out more so pt can rest between therapies. Can also attempt to stagger PT and OT on different days. Will reattempt another time.  [place], [place] 04/15/2015, 12:43 PM

## 2015-04-15 NOTE — Consult Note (Addendum)
WOC wound consult note Reason for Consult: Consult requested for left heel. Pt. Already assessed by WOCN on 03/18/15  Wound type:left heel pressure ulcer unstagable Pressure Ulcer POA: Yes. Family states wound was being treated by [facility] prior to admission.  Measurement:3.2cm x3cm Wound bed: moist boggy tan eschar Drainage (amount, consistency, odor) scant serous, no odor Periwound:intact Dressing procedure/placement/frequency: Santyl daily covered by moist dressing for chemical debridement. Floated heels with pillow and instructed family at bedside on importance of floating heels.  Please re-consult if further assistance is needed.  Thank-you,  Julien Girt MSN, [place], [place], [place], [place]

## 2015-04-15 NOTE — Progress Notes (Signed)
Physical Therapy Treatment Patient Details Name: Douglas [name] MRN: [id number] DOB: 06-10-1931 Today's Date: 04/15/2015    History of Present Illness Patient is an 81 y/o male who had right femur fracure with IM nail in January this year.  Since has had difficulty with urinary retention with history of prostate cancer and recent UTI, C-diff colitis.  Now with Acute on chronic renal failure stage IV; obstructive uropathy and urosepsis with pyelonephritis.  retrugrade pyelogram and double J stent    PT Comments      04/15/15 0859 04/15/15 0910 04/15/15 0912  Vital Signs  Pulse Rate (!) 120 (!) 125 (!) 119  BP (!) 142/73 mmHg (!) 129/102 mmHg 110/60 mmHg  Patient Position (if appropriate) Lying (performing isometric quad contractions bil) Sitting Sitting     04/15/15 0921  Vital Signs  Pulse Rate (!) 122  BP 134/85 mmHg  Patient Position (if appropriate) Lying   Activity tolerance limited today by decr BP with upright activity/sitting EOB; pt required more assist and support this session than last session; given multiple readmissions here recently, I believe it is worth considering post-acute rehab to maximize independence and safety with mobility prior to dc home, and when I discussed this with pt and wife, his wife indicated they will do "whatever it takes" to help him get better;   Still, they are well-versed in managing at home at a bed level, and if they choose to go home, will recommend hoyer lift and non-emergent ambulance transport; and of course, maximizing HH services   Follow Up Recommendations  SNF;Home health PT (See above comments)     Equipment Recommendations  Other (comment) (for home, consider hoyer lift and ambulance transport)    Recommendations for Other Services OT consult     Precautions / Restrictions Precautions Precautions: Fall Precaution Comments: orthostatic Restrictions Weight Bearing Restrictions: No RLE Weight Bearing: Weight bearing as  tolerated    Mobility  Bed Mobility Overal bed mobility: Needs Assistance Bed Mobility: Rolling;Sidelying to Sit;Sit to Supine   Sidelying to sit: Mod assist   Sit to supine: Min assist   General bed mobility comments: Heavy reliance on rail for rolling with cues; assist to support trunk coming up to sit; then to supine, assist for LE to prevent it falling off bed; cues for proper positioning in supine  Transfers Overall transfer level: Needs assistance Equipment used: 1 person hand held assist (gait belt and knees blocked) Transfers: Stand Pivot Transfers (pivot transfer towards [facility] for better positioning)              Ambulation/Gait                 Stairs            Wheelchair Mobility    Modified Rankin (Stroke Patients Only)       Balance   Sitting-balance support: Feet supported;Bilateral upper extremity supported Sitting balance-Leahy Scale: Poor Sitting balance - Comments: Needing support throughout time sitting up due to posterior lean                            Cognition Arousal/Alertness: Awake/alert Behavior During Therapy: Flat affect Overall Cognitive Status: Within Functional Limits for tasks assessed                      Exercises General Exercises - Lower Extremity Ankle Circles/Pumps: AROM;Both;10 reps Quad Sets: AROM;Both;10 reps;Supine Short Arc Quad: AROM;Both;10 reps (  alternating)    General Comments        Pertinent Vitals/Pain Pain Assessment: 0-10 Pain Score:  (no severity given) Pain Location: When he urinates Pain Descriptors / Indicators: Burning Pain Intervention(s): Monitored during session    Home Living                      Prior Function            PT Goals (current goals can now be found in the care plan section) Acute Rehab PT Goals Patient Stated Goal: return home PT Goal Formulation: With patient/family Time For Goal Achievement: 04/26/15 Potential to Achieve  Goals: Good Progress towards PT goals: Not progressing toward goals - comment (limited by orthostatic BP this session)    Frequency  Min 3X/week    PT Plan Other (comment);Equipment recommendations need to be updated (Consider updating dc plan to post-acute rehab)    Co-evaluation             End of Session   Activity Tolerance: Treatment limited secondary to medical complications (Comment) (due to decreased BP) Patient left: in bed;with call bell/phone within reach;with family/visitor present     Time: 5597-4163 PT Time Calculation (min) (ACUTE ONLY): 40 min  Charges:  $Therapeutic Exercise: 8-22 mins $Therapeutic Activity: 23-37 mins                    G Codes:      Quin Hoop 04/15/2015, 9:56 AM  Roney [place], [place] Pager [phone] Office [phone]

## 2015-04-15 NOTE — Care Management Note (Addendum)
CARE MANAGEMENT NOTE 04/15/2015  Patient:  Douglas [name], Douglas [name]   Account Number:  [id number]  Date Initiated:  04/15/2015  Documentation initiated by:  Edin Skarda  Subjective/Objective Assessment:   CM following for progression and d/c planning.     Action/Plan:   04/15/15 Continuing to follow spoke with PT they are recommending hoyer lift and ambulance ride home. Pt has other DME and was active with [facility].   Anticipated DC Date:  04/17/2015   Anticipated DC Plan:  [place]         Choice offered to / List presented to:             Status of service:  In process, will continue to follow Medicare Important Message given?  YES (If response is "NO", the following Medicare IM given date fields will be blank) Date Medicare IM given:  04/15/2015 Medicare IM given by:  Lavanya Roa Date Additional Medicare IM given:   Additional Medicare IM given by:    Discharge Disposition:    Per UR Regulation:    If discussed at Long Length of Stay Meetings, dates discussed:    Comments:   04/15/2015 Spoke with family and pt wife declined Hoyer lift at this time, stating that they do not have trouble getting the pt OOB, except that his BP drops therefore they are unable to get him to a chair.  CRoyal RN MPH, case manager, [phone]

## 2015-04-15 NOTE — Progress Notes (Signed)
[name] PROGRESS NOTE  Douglas [name]:[id number] DOB: April 02, 1931 DOA: 04/10/2015 PCP: [name]., MD  Assessment/Plan: 81 year old with PMH significant for prostate cancer 2009 (post status of radiation therapy), gastroparesis, chronic kidney disease-IV-V, history of C. difficile colitis (completed treatment, repeated C. difficile PCR negative on 03/17/15), who presents with generalized weakness and abdominal pain. Recent treated for UTI.  In ED, patient was found to have potassium of 5.6 without T wave peaking on EKG, AoCKD-IV-V, creatinine up from baseline 2.0-3.0 to 5.59, WBC 20.1, tachycardia, temperature 98.1, urinalysis is positive for UTI. Chest x-ray is negative for acute abnormalities. CT abdomen pelvis showed obstructive uropathy.   1-C diff colitis:  C diff positive.  Started on oral vancomycin on 4-16.   Had 4 BM yesterday,. One so far today. Stool less watery.   2-Sepsis, UTI, pyelonephritis.  CT abdomen showing Obstructive uropathy.  S/P  double J stent placement.  Urine culture grew yeast. Continue with  Fluconazole day 4.  Anaerobic culture:  few yeast.  Received ceftriaxone for 3 days. Urine culture only growing yeast.   3-Acute on chronic renal failure stage IV; obstructive uropathy: Cr baseline 2 to 3.  Worsening renal function secondary to  obstructive uropathy.   Urology consulted. Negative 1.2 L. Metabolic acidosis resolved.  S/P double J stent placement.  Creatinine trending down. Today at 3.7 Calcium corrected by albumin at : 7.6.  Started on calcium supplement.  NSL fluids.   4-Acute Hypoxic Resp Failure; requiring 2 L oxygen. Lungs with crackles. Will give one time dose IV lasix. Reassess. NSL fluids.   5-Anemia:  -check FOBT given hx of upper GIB -Continue iron supplements - S/P 2 unit PRBC during this admission.  6-Severe Protein calorie malnutrition: -consult to nutrition team -Ensure  7-Generalized weakness: It is most likely  due to deconditioning secondary to multiple comorbidities. -S/P transfusion 2 units PRBC.  -TSH elevated at 4.7. Will check free T 3 and T 4.  8-Hyperkalemia; in setting of renal failure.  Received kayexalate. k has decreased.  Resolved.   9-Abdominal pain; CT showed, kidney stone.  Resolved.  10-Diabetes: hold levemir. SSI.  Hypoglycemia. Resolved.  11-chronic left heel wound; wound care consulted.   Code Status: DNR Family Communication: care discussed with Wife.  Disposition Plan: inpatient treatment for sepsis, pyelonephritis. Wife wants to take patient home   Consultants: Urology  Procedures:  CT^ scan.   Antibiotics:  Ceftriaxone-stop 4-17  Fluconazole  Oral vancomycin 4-16  HPI/Subjective: Patient is more alert today. Still feeling weak. He is complaining of dyspnea.     Objective: Filed Vitals:   04/15/15 0921  BP: 134/85  Pulse: 122  Temp:   Resp:     Intake/Output Summary (Last 24 hours) at 04/15/15 1434 Last data filed at 04/15/15 1300  Gross per 24 hour  Intake 2320.83 ml  Output   1000 ml  Net 1320.83 ml   Filed Weights   04/12/15 2220 04/13/15 2119 04/14/15 2115  Weight: 56 kg (123 lb 7.3 oz) 56.4 kg (124 lb 5.4 oz) 55.9 kg (123 lb 3.8 oz)    Exam:   General:  Lethargic, wake up , say few words.   Cardiovascular: S 1, S 2 RRR  Respiratory: Crackles Bilaterally.   Abdomen: BS present, soft, right side tenderness.   Musculoskeletal: no edema  Data Reviewed: Basic Metabolic Panel:  Recent Labs Lab 04/12/15 0440 04/12/15 2100 04/13/15 0440 04/14/15 0423 04/15/15 0531  NA 139 138 139 137 138  K 3.0* 3.2* 3.2* 3.6 4.3  CL 100 102 104 104 106  CO2 24 23 22 21 19   GLUCOSE 165* 217* 198* 115* 115*  BUN 70* 64* 62* 53* 51*  CREATININE 4.99* 4.64* 4.44* 3.92* 3.77*  CALCIUM 6.8* 6.4* 6.4* 6.2* 6.8*   Liver Function Tests:  Recent Labs Lab 04/10/15 1715 04/11/15 0628  AST 43* 59*  ALT 24 29  ALKPHOS 110 128*   BILITOT 0.5 0.2*  PROT 8.4* 7.8  ALBUMIN 2.5* 2.2*   No results for input(s): LIPASE, AMYLASE in the last 168 hours. No results for input(s): AMMONIA in the last 168 hours. CBC:  Recent Labs Lab 04/10/15 1715 04/11/15 0628 04/12/15 0440 04/13/15 0440 04/14/15 0423 04/15/15 0531  WBC 20.1* 16.1* 10.5 7.8 6.3 8.4  NEUTROABS 17.1*  --   --   --   --   --   HGB 8.6* 8.5* 7.1* 8.1* 7.6* 10.5*  HCT 27.6* 26.7* 22.3* 25.3* 23.9* 33.4*  MCV 88.2 88.1 86.4 86.1 87.9 87.4  PLT 213 168 162 145* 127* 122*   Cardiac Enzymes: No results for input(s): CKTOTAL, CKMB, CKMBINDEX, TROPONINI in the last 168 hours. BNP (last 3 results)  Recent Labs  02/26/15 1726  BNP 187.0*    ProBNP (last 3 results) No results for input(s): PROBNP in the last 8760 hours.  CBG:  Recent Labs Lab 04/14/15 1147 04/14/15 1709 04/14/15 2118 04/15/15 0755 04/15/15 1149  GLUCAP 125* 138* 134* 106* 137*    Recent Results (from the past 240 hour(s))  Urine culture     Status: None   Collection Time: 04/10/15  6:06 PM  Result Value Ref Range Status   Specimen Description URINE, CATHETERIZED  Final   Special Requests NONE  Final   Colony Count   Final    75,000 COLONIES/ML Performed at [place] Performed at [facility]   Final   Report Status 04/12/2015 FINAL  Final  MRSA PCR Screening     Status: Abnormal   Collection Time: 04/10/15  8:45 PM  Result Value Ref Range Status   MRSA by PCR POSITIVE (A) NEGATIVE Final    Comment:        The GeneXpert MRSA Assay (FDA approved for NASAL specimens only), is one component of a comprehensive MRSA colonization surveillance program. It is not intended to diagnose MRSA infection nor to guide or monitor treatment for MRSA infections. RESULT CALLED TO, READ BACK BY AND VERIFIED WITH: CALLED TO RN REBECCA QIU [id number] @0026  THANEY   Culture, blood (x 2)     Status: None (Preliminary result)   Collection Time:  04/10/15  9:33 PM  Result Value Ref Range Status   Specimen Description BLOOD RIGHT ANTECUBITAL  Final   Special Requests BOTTLES DRAWN AEROBIC ONLY 4CC  Final   Culture   Final           BLOOD CULTURE RECEIVED NO GROWTH TO DATE CULTURE WILL BE HELD FOR 5 DAYS BEFORE ISSUING A FINAL NEGATIVE REPORT Performed at [facility]    Report Status PENDING  Incomplete  Culture, blood (x 2)     Status: None (Preliminary result)   Collection Time: 04/10/15  9:49 PM  Result Value Ref Range Status   Specimen Description BLOOD RIGHT HAND  Final   Special Requests BOTTLES DRAWN AEROBIC ONLY 5CC  Final   Culture   Final           BLOOD  CULTURE RECEIVED NO GROWTH TO DATE CULTURE WILL BE HELD FOR 5 DAYS BEFORE ISSUING A FINAL NEGATIVE REPORT Performed at [facility]    Report Status PENDING  Incomplete  Anaerobic culture     Status: None (Preliminary result)   Collection Time: 04/12/15  1:57 PM  Result Value Ref Range Status   Specimen Description   Final    URINE, CATHETERIZED RIGHT RENAL PELVIS Performed at [place] Requests   Final    PATIENT ON FOLLOWING ROCEPHIN Performed at [facility]    Gram Stain   Final    ABUNDANT WBC PRESENT,BOTH PMN AND MONONUCLEAR NO SQUAMOUS EPITHELIAL CELLS SEEN NO ORGANISMS SEEN FEW YEAST Performed at [facility]    Culture   Final    NO ANAEROBES ISOLATED; CULTURE IN PROGRESS FOR 5 DAYS Performed at [facility]    Report Status PENDING  Incomplete  Urine culture     Status: None   Collection Time: 04/12/15  1:57 PM  Result Value Ref Range Status   Specimen Description   Final    URINE, CATHETERIZED RIGHT RENAL PELVIS Performed at [place] Requests   Final    PATIENT ON FOLLOWING ROCEPHIN Performed at [place]   Final    >=100,000 COLONIES/ML Performed at [place] Performed at [facility]   Final   Report Status 04/14/2015 FINAL  Final  Clostridium Difficile by PCR     Status: Abnormal   Collection Time: 04/13/15  5:56 PM  Result Value Ref Range Status   C difficile by pcr POSITIVE (A) NEGATIVE Final    Comment: CRITICAL RESULT CALLED TO, READ BACK BY AND VERIFIED WITH: A.MACROHON,RN 04/13/15 @2010  BY V.WILKINS      Studies: No results found.  Scheduled Meds: . antiseptic oral rinse  7 mL Mouth Rinse BID  . atorvastatin  40 mg Oral Daily  . calcium-vitamin D  1 tablet Oral BID  . collagenase   Topical Daily  . feeding supplement (ENSURE)  1 Container Oral TID BM  . feeding supplement (NEPRO CARB STEADY)  237 mL Oral Q1500  . fluconazole  100 mg Oral Q24H  . gabapentin  300 mg Oral QHS  . heparin  5,000 Units Subcutaneous 3 times per day  . insulin aspart  0-9 Units Subcutaneous TID WC  . iron polysaccharides  150 mg Oral Daily  . ketorolac  1 drop Both Eyes TID  . metoCLOPramide  5 mg Oral TID AC & HS  . tamsulosin  0.4 mg Oral Daily  . vancomycin  125 mg Oral QID   Continuous Infusions:    Principal Problem:   Hyperkalemia Active Problems:   Hypertension   Hyperlipidemia   Anemia, chronic disease   Gastroparesis due to DM: DELAYED GASTRIC EMPTYING STUDY 02/08/15   Sepsis secondary to UTI   UTI (lower urinary tract infection)   C. difficile colitis   Protein-calorie malnutrition, severe   Acute renal failure superimposed on stage 5 chronic kidney disease   Abdominal pain   Generalized weakness    Time spent: 35 minutes.     Niel Hummer A  [name] Pager [phone] If 7PM-7AM, please contact night-coverage at [email or web address], password [facility] 04/15/2015, 2:34 PM  LOS: 5 days

## 2015-04-16 LAB — CBC
HCT: 29.9 % — ABNORMAL LOW (ref 39.0–52.0)
Hemoglobin: 9.5 g/dL — ABNORMAL LOW (ref 13.0–17.0)
MCH: 27.9 pg (ref 26.0–34.0)
MCHC: 31.8 g/dL (ref 30.0–36.0)
MCV: 87.9 fL (ref 78.0–100.0)
Platelets: 144 10*3/uL — ABNORMAL LOW (ref 150–400)
RBC: 3.4 MIL/uL — AB (ref 4.22–5.81)
RDW: 15.6 % — ABNORMAL HIGH (ref 11.5–15.5)
WBC: 7.9 10*3/uL (ref 4.0–10.5)

## 2015-04-16 LAB — TYPE AND SCREEN
ABO/RH(D): A POS
Antibody Screen: NEGATIVE
Unit division: 0

## 2015-04-16 LAB — BASIC METABOLIC PANEL
Anion gap: 10 (ref 5–15)
BUN: 48 mg/dL — AB (ref 6–23)
CHLORIDE: 106 mmol/L (ref 96–112)
CO2: 25 mmol/L (ref 19–32)
CREATININE: 3.74 mg/dL — AB (ref 0.50–1.35)
Calcium: 7.4 mg/dL — ABNORMAL LOW (ref 8.4–10.5)
GFR calc Af Amer: 16 mL/min — ABNORMAL LOW (ref >90)
GFR calc non Af Amer: 14 mL/min — ABNORMAL LOW (ref >90)
GLUCOSE: 122 mg/dL — AB (ref 70–99)
POTASSIUM: 4.2 mmol/L (ref 3.5–5.1)
Sodium: 141 mmol/L (ref 135–145)

## 2015-04-16 LAB — T3, FREE: T3 FREE: 1.5 pg/mL — AB (ref 2.0–4.4)

## 2015-04-16 LAB — GLUCOSE, CAPILLARY
GLUCOSE-CAPILLARY: 111 mg/dL — AB (ref 70–99)
GLUCOSE-CAPILLARY: 125 mg/dL — AB (ref 70–99)
GLUCOSE-CAPILLARY: 146 mg/dL — AB (ref 70–99)
Glucose-Capillary: 120 mg/dL — ABNORMAL HIGH (ref 70–99)

## 2015-04-16 LAB — T4, FREE: Free T4: 0.91 ng/dL (ref 0.80–1.80)

## 2015-04-16 MED ORDER — FUROSEMIDE 10 MG/ML IJ SOLN
40.0000 mg | Freq: Once | INTRAMUSCULAR | Status: AC
  Administered 2015-04-16: 40 mg via INTRAVENOUS
  Filled 2015-04-16: qty 4

## 2015-04-16 MED ORDER — LEVOTHYROXINE SODIUM 25 MCG PO TABS
25.0000 ug | ORAL_TABLET | Freq: Every day | ORAL | Status: DC
  Administered 2015-04-18 – 2015-04-19 (×2): 25 ug via ORAL
  Filled 2015-04-16 (×4): qty 1

## 2015-04-16 NOTE — Progress Notes (Signed)
Physical Therapy Treatment Patient Details Name: Douglas [name] MRN: [id number] DOB: 1931-10-16 Today's Date: 04/16/2015    History of Present Illness Patient is an 81 y/o male who had right femur fracure with IM nail in January this year.  Since has had difficulty with urinary retention with history of prostate cancer and recent UTI, C-diff colitis.  Now with Acute on chronic renal failure stage IV; obstructive uropathy and urosepsis with pyelonephritis.  For retrugrade pyelogram and double J stent today.    PT Comments    Session focused on progressing activity tolerance and assisting pt OOB to chair; Needing mod assist and knees blocked during transfer; Given this, we must consider hoyer lift for OOB transfers at home, and of course, resumption of [place] services:   [place] ambulance transport home  Follow Up Recommendations  Home health PT;Other (comment) (Pt and wife clearly choosing to dc home)     Equipment Recommendations  Other (comment) (hoyer lift ambulance transport)    Recommendations for Other Services OT consult     Precautions / Restrictions Precautions Precautions: Fall Precaution Comments: orthostatic Restrictions RLE Weight Bearing: Weight bearing as tolerated    Mobility  Bed Mobility Overal bed mobility: Needs Assistance Bed Mobility: Rolling;Sidelying to Sit Rolling: Min assist Sidelying to sit: Mod assist       General bed mobility comments: Heavy reliance on rail for rolling with verbal and tactile cues to get into fully sidelying, "stack your hips an dshoulders" ; assist to support trunk coming up to sit; Used bed pad to square off hips  Transfers Overall transfer level: Needs assistance Equipment used:  (bed pad to cradle hips) Transfers: Lateral/Scoot Transfers          Lateral/Scoot Transfers: Mod assist General transfer comment: Heavy mod assist with knees blocked to scoot OOB to chair; cues  for hand placement; noted better anterior weight shift to get weight off of hips during transfer  Ambulation/Gait             General Gait Details: unable today; noted pt had walked 15 ft at [facility] in March   Stairs            Wheelchair Mobility    Modified Rankin (Stroke Patients Only)       Balance     Sitting balance-Leahy Scale: Fair                              Cognition Arousal/Alertness: Awake/alert Behavior During Therapy: WFL for tasks assessed/performed (Smiled end of session) Overall Cognitive Status: Within Functional Limits for tasks assessed                      Exercises Total Joint Exercises Towel Squeeze: AROM;Both;10 reps General Exercises - Lower Extremity Quad Sets: AROM;Both;10 reps;Supine Hip ABduction/ADduction: AROM;Both;10 reps;Supine (isometrically)    General Comments General comments (skin integrity, edema, etc.): incontinent of bowel, so assisted with hygeine prior to getting up; noted pressure area at sacrum with area of breakdown midline; Notifed RN and placed dressing      Pertinent Vitals/Pain Pain Assessment: No/denies pain    Home Living                      Prior Function            PT Goals (current goals can now be found in the  care plan section) Acute Rehab PT Goals Patient Stated Goal: return home PT Goal Formulation: With patient/family Time For Goal Achievement: 04/26/15 Potential to Achieve Goals: Good Progress towards PT goals: Progressing toward goals    Frequency  Min 3X/week    PT Plan Current plan remains appropriate    Co-evaluation             End of Session Equipment Utilized During Treatment:  (bed pad) Activity Tolerance: Patient tolerated treatment well Patient left: in chair;with call bell/phone within reach;with family/visitor present     Time: 4163-8453 PT Time Calculation (min) (ACUTE ONLY): 45 min  Charges:  $Therapeutic Exercise: 8-22  mins $Therapeutic Activity: 23-37 mins                    G Codes:      Quin Hoop 04/16/2015, 10:41 AM  Roney Marion, [place] Pager [phone] Office [phone]

## 2015-04-16 NOTE — Evaluation (Signed)
Occupational Therapy Evaluation Patient Details Name: Douglas [name] MRN: [id number] DOB: 10/10/1931 Today's Date: 04/16/2015    History of Present Illness Patient is an 81 y/o male who had right femur fracure with IM nail in January this year.  Since has had difficulty with urinary retention with history of prostate cancer and recent UTI, C-diff colitis.  Now with Acute on chronic renal failure stage IV; obstructive uropathy and urosepsis with pyelonephritis.  For retrugrade pyelogram and double J stent today.   Clinical Impression   Pt with dependence in bathing, dressing and toileting at baseline.  Mobility is limited to bed to w/c since pt fractured his hip in January 2016. Pt currently not self feeding and requires assist for grooming. He is significantly deconditioned. Educated family in benefits of OOB activity and encouraging pt to participate maximally in ADL for building strength and endurance.   Pt could benefit from [place] rehab in SNF, but family is refusing. Will follow acutely to address bed mobility, grooming and self feeding.    Follow Up Recommendations  Home health OT;Supervision/Assistance - 24 hour    Equipment Recommendations       Recommendations for Other Services       Precautions / Restrictions Precautions Precautions: Fall Precaution Comments: orthostatic Restrictions Weight Bearing Restrictions: No RLE Weight Bearing: Weight bearing as tolerated      Mobility Bed Mobility Overal bed mobility: Needs Assistance Bed Mobility: Rolling;Sidelying to Sit;Sit to Sidelying Rolling: Min assist Sidelying to sit: Mod assist     Sit to sidelying: Min assist General bed mobility comments: heavily reliant on rail, verbal cues to log roll technique, assist for LEs and trunk  Transfers                 General transfer comment: pt declined transfer due to fatigue    Balance     Sitting balance-Leahy Scale: Poor                                      ADL Overall ADL's : Needs assistance/impaired Eating/Feeding: Maximal assistance;Sitting   Grooming: Wash/dry hands;Wash/dry face;Brushing hair;Maximal assistance;Bed level                                 General ADL Comments: Pt requires assist for bathing and dressing x 4 months.     Vision     Perception     Praxis      Pertinent Vitals/Pain Pain Assessment: No/denies pain     Hand Dominance Right   Extremity/Trunk Assessment Upper Extremity Assessment Upper Extremity Assessment: Generalized weakness   Lower Extremity Assessment Lower Extremity Assessment: Defer to PT evaluation       Communication Communication Communication: No difficulties   Cognition Arousal/Alertness: Lethargic Behavior During Therapy: Flat affect Overall Cognitive Status: Within Functional Limits for tasks assessed                     General Comments       Exercises       Shoulder Instructions      Home Living Family/patient expects to be discharged to:: Private residence Living Arrangements: Spouse/significant other Available Help at Discharge: Available 24 hours/day;Home health;Family;Friend(s);Neighbor Type of Home: House Home Access: [place]: One level     Bathroom Shower/Tub: [place] unit  Home Equipment: [place] - 2 wheels;Cane - single point;Tub bench;Wheelchair - manual;Hospital bed          Prior Functioning/Environment Level of Independence: Needs assistance  Gait / Transfers Assistance Needed: not functionally ambulatory since right hip fracture in January 2016 due to multiple illnesses ADL's / Homemaking Assistance Needed: Pt can self feed and groom with set up at bedlevel.  Dependent in bathing, dressing and toileting.   Comments: Pt has [facility] PT/OT and nursing.    OT Diagnosis: Generalized weakness   OT Problem List: Decreased strength;Decreased activity tolerance;Impaired balance  (sitting and/or standing)   OT Treatment/Interventions: Self-care/ADL training;Therapeutic activities;Patient/family education;Balance training;DME and/or AE instruction    OT Goals(Current goals can be found in the care plan section) Acute Rehab OT Goals Patient Stated Goal: return home OT Goal Formulation: With patient Time For Goal Achievement: 04/30/15 Potential to Achieve Goals: Good ADL Goals Pt Will Perform Eating: with set-up;sitting Pt Will Perform Grooming: with set-up;sitting Pt Will Transfer to Toilet: with min assist;stand pivot transfer;bedside commode Additional ADL Goal #1: Pt will perform bed mobility with min assist  in preparation for ADL at EOB.  OT Frequency: Min 2X/week   Barriers to D/C:            Co-evaluation              End of Session Nurse Communication:  (family with many medical questions)  Activity Tolerance: Patient limited by fatigue Patient left: in bed;with call bell/phone within reach;with family/visitor present   Time: 3817-7116 OT Time Calculation (min): 17 min Charges:  OT General Charges $OT Visit: 1 Procedure OT Evaluation $Initial OT Evaluation Tier I: 1 Procedure G-Codes:    Malka So 04/16/2015, 4:03 PM [phone]

## 2015-04-16 NOTE — Progress Notes (Signed)
[name] PROGRESS NOTE  Douglas [name] QPR:[id number] DOB: 04-07-1931 DOA: 04/10/2015 PCP: [name]., MD  Assessment/Plan: 81 year old with PMH significant for prostate cancer 2009 (post status of radiation therapy), gastroparesis, chronic kidney disease-IV-V, history of C. difficile colitis (completed treatment, repeated C. difficile PCR negative on 03/17/15), who presents with generalized weakness and abdominal pain. Recent treated for UTI.  In ED, patient was found to have potassium of 5.6 without T wave peaking on EKG, AoCKD-IV-V, creatinine up from baseline 2.0-3.0 to 5.59, WBC 20.1, tachycardia, temperature 98.1, urinalysis is positive for UTI. Chest x-ray is negative for acute abnormalities. CT abdomen pelvis showed obstructive uropathy.  He underwent  double J stent placement 4-15. Culture is only growing yeast. Renal function improved with fluids and with stent place,ment. Patient also develops diarrhea. C diff positive. Ceftriaxone was discontinue. He was started on oral vancomycin. C diff infection has improved. He has been complaining of dyspnea. Fluids stop. He received 40 mg IV lasix on 4-18. Will repeat lasix dose today.  Disposition: home when respiratory status improved.   1-C diff colitis:  C diff positive.  Started on oral vancomycin on 4-16.   Diarrhea improving.   2-Sepsis, UTI, pyelonephritis.  CT abdomen showing Obstructive uropathy.  S/P  double J stent placement.  Urine culture grew yeast. Continue with  Fluconazole day 5.  Anaerobic culture:  few yeast.  Received ceftriaxone for 3 days. Urine culture only growing yeast.   3-Acute on chronic renal failure stage IV; obstructive uropathy: Cr baseline 2 to 3.  Worsening renal function secondary to  obstructive uropathy.   Urology consulted. Negative 1.2 L. Metabolic acidosis resolved.  S/P double J stent placement.  Creatinine stable at 3.7 Calcium corrected by albumin at : 7.6.  Started on calcium  supplement.  NSL fluids.   4-Acute Hypoxic Resp Failure; requiring 2 L oxygen. Lungs with crackles. Received 40 mg IV lasix 4-18. Repeat lasix dose 4-19.   5-Anemia:  -check FOBT given hx of upper GIB -Continue iron supplements - S/P 2 unit PRBC during this admission.  6-Severe Protein calorie malnutrition: -consult to nutrition team -Ensure  7-Generalized weakness: It is most likely due to deconditioning secondary to multiple comorbidities. -S/P transfusion 2 units PRBC.  -TSH elevated at 4.7.  free T 3 low at 1.5 and T 4 normal. Will start synthroid, to see if this will help with weakness.   8-Hyperkalemia; in setting of renal failure.  Received kayexalate. k has decreased.  Resolved.   9-Abdominal pain; CT showed, kidney stone.  Resolved.   10-Diabetes: holding levemir. SSI.  Hypoglycemia. Resolved.   11-chronic left heel wound; wound care consulted.   Code Status: DNR Family Communication: care discussed with Wife.  Disposition Plan: home in 24 to 48 hours depending on renal function and respiratory status.    Consultants: Urology  Procedures:  CT^ scan.   Antibiotics:  Ceftriaxone-stop 4-17  Fluconazole  Oral vancomycin 4-16  HPI/Subjective: More Alert, breathing better than yesterday but still SOB.  Diarrhea has improved.     Objective: Filed Vitals:   04/16/15 1000  BP: 135/72  Pulse: 107  Temp: 98 F (36.7 C)  Resp: 18    Intake/Output Summary (Last 24 hours) at 04/16/15 1039 Last data filed at 04/16/15 0900  Gross per 24 hour  Intake    600 ml  Output   2126 ml  Net  -1526 ml   Filed Weights   04/13/15 2119 04/14/15 2115 04/15/15 2128  Weight: 56.4 kg (124 lb 5.4 oz) 55.9 kg (123 lb 3.8 oz) 60.52 kg (133 lb 6.8 oz)    Exam:   General:  Alert today, mild tachypnea notice.   Cardiovascular: S 1, S 2 RRR  Respiratory: Crackles Bilaterally.   Abdomen: BS present, soft, NT, mild distended.   Musculoskeletal: no edema  Data  Reviewed: Basic Metabolic Panel:  Recent Labs Lab 04/12/15 0440 04/12/15 2100 04/13/15 0440 04/14/15 0423 04/15/15 0531  NA 139 138 139 137 138  K 3.0* 3.2* 3.2* 3.6 4.3  CL 100 102 104 104 106  CO2 24 23 22 21 19   GLUCOSE 165* 217* 198* 115* 115*  BUN 70* 64* 62* 53* 51*  CREATININE 4.99* 4.64* 4.44* 3.92* 3.77*  CALCIUM 6.8* 6.4* 6.4* 6.2* 6.8*   Liver Function Tests:  Recent Labs Lab 04/10/15 1715 04/11/15 0628  AST 43* 59*  ALT 24 29  ALKPHOS 110 128*  BILITOT 0.5 0.2*  PROT 8.4* 7.8  ALBUMIN 2.5* 2.2*   No results for input(s): LIPASE, AMYLASE in the last 168 hours. No results for input(s): AMMONIA in the last 168 hours. CBC:  Recent Labs Lab 04/10/15 1715 04/11/15 0628 04/12/15 0440 04/13/15 0440 04/14/15 0423 04/15/15 0531  WBC 20.1* 16.1* 10.5 7.8 6.3 8.4  NEUTROABS 17.1*  --   --   --   --   --   HGB 8.6* 8.5* 7.1* 8.1* 7.6* 10.5*  HCT 27.6* 26.7* 22.3* 25.3* 23.9* 33.4*  MCV 88.2 88.1 86.4 86.1 87.9 87.4  PLT 213 168 162 145* 127* 122*   Cardiac Enzymes: No results for input(s): CKTOTAL, CKMB, CKMBINDEX, TROPONINI in the last 168 hours. BNP (last 3 results)  Recent Labs  02/26/15 1726  BNP 187.0*    ProBNP (last 3 results) No results for input(s): PROBNP in the last 8760 hours.  CBG:  Recent Labs Lab 04/15/15 0755 04/15/15 1149 04/15/15 1617 04/15/15 2126 04/16/15 0759  GLUCAP 106* 137* 122* 111* 120*    Recent Results (from the past 240 hour(s))  Urine culture     Status: None   Collection Time: 04/10/15  6:06 PM  Result Value Ref Range Status   Specimen Description URINE, CATHETERIZED  Final   Special Requests NONE  Final   Colony Count   Final    75,000 COLONIES/ML Performed at [place] Performed at [facility]   Final   Report Status 04/12/2015 FINAL  Final  MRSA PCR Screening     Status: Abnormal   Collection Time: 04/10/15  8:45 PM  Result Value Ref Range Status   MRSA by  PCR POSITIVE (A) NEGATIVE Final    Comment:        The GeneXpert MRSA Assay (FDA approved for NASAL specimens only), is one component of a comprehensive MRSA colonization surveillance program. It is not intended to diagnose MRSA infection nor to guide or monitor treatment for MRSA infections. RESULT CALLED TO, READ BACK BY AND VERIFIED WITH: CALLED TO RN REBECCA QIU [id number] @0026  THANEY   Culture, blood (x 2)     Status: None (Preliminary result)   Collection Time: 04/10/15  9:33 PM  Result Value Ref Range Status   Specimen Description BLOOD RIGHT ANTECUBITAL  Final   Special Requests BOTTLES DRAWN AEROBIC ONLY 4CC  Final   Culture   Final           BLOOD CULTURE RECEIVED NO GROWTH TO DATE CULTURE WILL BE HELD  FOR 5 DAYS BEFORE ISSUING A FINAL NEGATIVE REPORT Performed at [facility]    Report Status PENDING  Incomplete  Culture, blood (x 2)     Status: None (Preliminary result)   Collection Time: 04/10/15  9:49 PM  Result Value Ref Range Status   Specimen Description BLOOD RIGHT HAND  Final   Special Requests BOTTLES DRAWN AEROBIC ONLY 5CC  Final   Culture   Final           BLOOD CULTURE RECEIVED NO GROWTH TO DATE CULTURE WILL BE HELD FOR 5 DAYS BEFORE ISSUING A FINAL NEGATIVE REPORT Performed at [facility]    Report Status PENDING  Incomplete  Anaerobic culture     Status: None (Preliminary result)   Collection Time: 04/12/15  1:57 PM  Result Value Ref Range Status   Specimen Description   Final    URINE, CATHETERIZED RIGHT RENAL PELVIS Performed at [place] Requests   Final    PATIENT ON FOLLOWING ROCEPHIN Performed at [facility]    Gram Stain   Final    ABUNDANT WBC PRESENT,BOTH PMN AND MONONUCLEAR NO SQUAMOUS EPITHELIAL CELLS SEEN NO ORGANISMS SEEN FEW YEAST Performed at [facility]    Culture   Final    NO ANAEROBES ISOLATED; CULTURE IN PROGRESS FOR 5 DAYS Performed at [facility]    Report Status PENDING  Incomplete  Urine culture     Status: None   Collection Time: 04/12/15  1:57 PM  Result Value Ref Range Status   Specimen Description   Final    URINE, CATHETERIZED RIGHT RENAL PELVIS Performed at [place] Requests   Final    PATIENT ON FOLLOWING ROCEPHIN Performed at [place]   Final    >=100,000 COLONIES/ML Performed at [place] Performed at [facility]   Final   Report Status 04/14/2015 FINAL  Final  Clostridium Difficile by PCR     Status: Abnormal   Collection Time: 04/13/15  5:56 PM  Result Value Ref Range Status   C difficile by pcr POSITIVE (A) NEGATIVE Final    Comment: CRITICAL RESULT CALLED TO, READ BACK BY AND VERIFIED WITH: A.MACROHON,RN 04/13/15 @2010  BY V.WILKINS      Studies: No results found.  Scheduled Meds: . antiseptic oral rinse  7 mL Mouth Rinse BID  . atorvastatin  40 mg Oral Daily  . calcium-vitamin D  1 tablet Oral BID  . Chlorhexidine Gluconate Cloth  6 each Topical Q0600  . collagenase   Topical Daily  . feeding supplement (ENSURE)  1 Container Oral TID BM  . feeding supplement (NEPRO CARB STEADY)  237 mL Oral Q1500  . fluconazole  100 mg Oral Q24H  . gabapentin  300 mg Oral QHS  . heparin  5,000 Units Subcutaneous 3 times per day  . insulin aspart  0-9 Units Subcutaneous TID WC  . iron polysaccharides  150 mg Oral Daily  . ketorolac  1 drop Both Eyes TID  . metoCLOPramide  5 mg Oral TID AC & HS  . mupirocin ointment  1 application Nasal BID  . tamsulosin  0.4 mg Oral Daily  . vancomycin  125 mg Oral QID   Continuous Infusions:    Principal Problem:   Hyperkalemia Active Problems:   Hypertension   Hyperlipidemia  Anemia, chronic disease   Gastroparesis due to DM: DELAYED GASTRIC EMPTYING STUDY 02/08/15   Sepsis secondary to UTI   UTI (lower urinary tract infection)   C. difficile  colitis   Protein-calorie malnutrition, severe   Acute renal failure superimposed on stage 5 chronic kidney disease   Abdominal pain   Generalized weakness    Time spent: 35 minutes.     Niel Hummer A  [name] Pager [phone] If 7PM-7AM, please contact night-coverage at [email or web address], password [facility] 04/16/2015, 10:39 AM  LOS: 6 days

## 2015-04-16 NOTE — Progress Notes (Signed)
OT Cancellation Note  Patient Details Name: Douglas [name] MRN: [id number] DOB: Jun 25, 1931   Cancelled Treatment:    Reason Eval/Treat Not Completed: Fatigue/lethargy limiting ability to participate. PT assisted pt to the recliner this am. Spoke with nursing and she states that pt very fatigued after this activity and is asleep. She requests that OT try back later today . Will reattempt as able.  [place], [place] 04/16/2015, 11:35 AM

## 2015-04-17 ENCOUNTER — Inpatient Hospital Stay (HOSPITAL_COMMUNITY): Payer: Medicare Other

## 2015-04-17 LAB — ANAEROBIC CULTURE

## 2015-04-17 LAB — CBC
HEMATOCRIT: 32.7 % — AB (ref 39.0–52.0)
Hemoglobin: 10.1 g/dL — ABNORMAL LOW (ref 13.0–17.0)
MCH: 27.4 pg (ref 26.0–34.0)
MCHC: 30.9 g/dL (ref 30.0–36.0)
MCV: 88.9 fL (ref 78.0–100.0)
PLATELETS: 136 10*3/uL — AB (ref 150–400)
RBC: 3.68 MIL/uL — ABNORMAL LOW (ref 4.22–5.81)
RDW: 15.2 % (ref 11.5–15.5)
WBC: 6.9 10*3/uL (ref 4.0–10.5)

## 2015-04-17 LAB — RENAL FUNCTION PANEL
ALBUMIN: 2.1 g/dL — AB (ref 3.5–5.2)
Anion gap: 12 (ref 5–15)
BUN: 45 mg/dL — AB (ref 6–23)
CO2: 23 mmol/L (ref 19–32)
Calcium: 7.6 mg/dL — ABNORMAL LOW (ref 8.4–10.5)
Chloride: 105 mmol/L (ref 96–112)
Creatinine, Ser: 3.58 mg/dL — ABNORMAL HIGH (ref 0.50–1.35)
GFR calc Af Amer: 17 mL/min — ABNORMAL LOW (ref >90)
GFR calc non Af Amer: 15 mL/min — ABNORMAL LOW (ref >90)
Glucose, Bld: 118 mg/dL — ABNORMAL HIGH (ref 70–99)
POTASSIUM: 4.1 mmol/L (ref 3.5–5.1)
Phosphorus: 3.6 mg/dL (ref 2.3–4.6)
Sodium: 140 mmol/L (ref 135–145)

## 2015-04-17 LAB — GLUCOSE, CAPILLARY
GLUCOSE-CAPILLARY: 135 mg/dL — AB (ref 70–99)
Glucose-Capillary: 108 mg/dL — ABNORMAL HIGH (ref 70–99)
Glucose-Capillary: 127 mg/dL — ABNORMAL HIGH (ref 70–99)
Glucose-Capillary: 163 mg/dL — ABNORMAL HIGH (ref 70–99)

## 2015-04-17 LAB — CULTURE, BLOOD (ROUTINE X 2)
CULTURE: NO GROWTH
Culture: NO GROWTH

## 2015-04-17 MED ORDER — NEPRO/CARBSTEADY PO LIQD
237.0000 mL | Freq: Two times a day (BID) | ORAL | Status: DC
  Administered 2015-04-18 – 2015-04-19 (×2): 237 mL via ORAL

## 2015-04-17 MED ORDER — PRO-STAT SUGAR FREE PO LIQD
30.0000 mL | Freq: Three times a day (TID) | ORAL | Status: DC
  Administered 2015-04-17 – 2015-04-19 (×6): 30 mL via ORAL
  Filled 2015-04-17 (×7): qty 30

## 2015-04-17 MED ORDER — ENSURE PUDDING PO PUDG
1.0000 | Freq: Two times a day (BID) | ORAL | Status: DC
  Administered 2015-04-18 – 2015-04-19 (×2): 1 via ORAL

## 2015-04-17 NOTE — Progress Notes (Signed)
NUTRITION FOLLOW UP  Pt meets criteria for SEVERE MALNUTRITION in the context of chronic illness as evidenced by a 15.9% weight loss in 4 months and severe muscle mass loss.  DOCUMENTATION CODES Per approved criteria  -Severe malnutrition in the context of chronic illness   INTERVENTION: Provide Ensure Pudding po BID, each supplement provides 170 kcal and 4 grams of protein.  Provide Nepro Shake po BID, each supplement provides 425 kcal and 19 grams protein.  Provide 30 ml Prostat po TID, each supplement provides 100 kcal and 15 grams of protein.   Downgrade diet to dysphagia 3 as pt with difficulty chewing.   RD to continue to monitor.  NUTRITION DIAGNOSIS: Malnutrition related to chronic illness as evidenced by weight loss and severe muscle mass loss; ongoing  Goal: Pt to meet >/= 90% of their estimated nutrition needs; not met  Monitor:  PO intake, weight trends, labs, I/O's  81 y.o. male  Admitting Dx: Hyperkalemia  ASSESSMENT: Pt with past medical history for HTN, hyperlipidemia, DM, anemia, peripheral neuropathy, upper GI bleeding, prostate cancer 2009, gastroparesis, chronic kidney disease-IV-V, history of C. difficile colitis, who presents with generalized weakness and abdominal pain.  Procedure (4/15): Right double-J stent placement  Meal completion has been 0-35%. Family reports pt has not been wanting to eat in addition having difficulties chewing his food. RD to downgrade diet to dysphagia 3. Pt has been consuming his ensure pudding, however has not tried the [employer]. Wife was encouraged for pt to try the Nepro. Wife is also agreeable on Prostat for the pt to maximize protein intake. RD to order. Family has been encouraging pt to eat his food at meals and to consume his supplements.   Labs: Low calcium and GFR. High BUN, creatinine.  Height: Ht Readings from Last 1 Encounters:  04/15/15 5' 5" (1.651 m)    Weight: Wt Readings from Last 1 Encounters:   04/15/15 133 lb 6.8 oz (60.52 kg)    BMI:  Body mass index is 22.2 kg/(m^2).  Re-Estimated Nutritional Needs: Kcal: 1850-2050 Protein: 85-105 grams Fluid: 1.2 L/day  Skin: Unstagable pressure ulcer on L heel, Stage II pressure ulcer on sacrum, non-pitting RLE edema  Diet Order: Diet renal with fluid restriction Fluid restriction:: 1200 mL Fluid; Room service appropriate?: Yes; Fluid consistency:: Thin   Intake/Output Summary (Last 24 hours) at 04/17/15 1353 Last data filed at 04/17/15 1320  Gross per 24 hour  Intake    360 ml  Output    700 ml  Net   -340 ml    Last BM: 4/19  Labs:   Recent Labs Lab 04/15/15 0531 04/16/15 1248 04/17/15 0534  NA 138 141 140  K 4.3 4.2 4.1  CL 106 106 105  CO2 _0 BUN 51* 48* 45*  CREATININE 3.77* 3.74* 3.58*  CALCIUM 6.8* 7.4* 7.6*  PHOS  --   --  3.6  GLUCOSE 115* 122* 118*    CBG (last 3)   Recent Labs  04/16/15 2159 04/17/15 0829 04/17/15 1200  GLUCAP 146* 108* 127*    Scheduled Meds: . antiseptic oral rinse  7 mL Mouth Rinse BID  . atorvastatin  40 mg Oral Daily  . calcium-vitamin D  1 tablet Oral BID  . Chlorhexidine Gluconate Cloth  6 each Topical Q0600  . collagenase   Topical Daily  . feeding supplement (ENSURE)  1 Container Oral TID BM  . feeding supplement (NEPRO CARB STEADY)  237 mL Oral Q1500  .  fluconazole  100 mg Oral Q24H  . gabapentin  300 mg Oral QHS  . insulin aspart  0-9 Units Subcutaneous TID WC  . iron polysaccharides  150 mg Oral Daily  . ketorolac  1 drop Both Eyes TID  . levothyroxine  25 mcg Oral QAC breakfast  . metoCLOPramide  5 mg Oral TID AC & HS  . mupirocin ointment  1 application Nasal BID  . tamsulosin  0.4 mg Oral Daily  . vancomycin  125 mg Oral QID    Continuous Infusions:    Past Medical History  Diagnosis Date  . Hypertension   . Hyperlipidemia   . Hyperkalemia   . UTI (lower urinary tract infection)   . Sensorineural hearing loss, unspecified   .  Epiglottitis   . Chronic ankle pain   . Neuromuscular disorder     neurophaty lower legs  . Upper GI bleeding     while at [facility] 12/2014  . Confusion   . Prostate cancer 2009    radiation  . Type II diabetes mellitus   . Gastroparesis due to DM: DELAYED GASTRIC EMPTYING STUDY 02/08/15 02/08/2015  . Pneumonia 12/2014; 02/2015  . History of blood transfusion X 3    "related to low HgB"  . Anemia   . GERD (gastroesophageal reflux disease)   . Arthritis     "knees" (04/10/2015)  . History of gout   . Depression     "since he broke his leg in 12/2014" (04/10/2015)  . Calculus of kidney   . Chronic kidney disease (CKD), stage IV (severe)     "he's on the verge of dialysis" (04/10/2015)    Past Surgical History  Procedure Laterality Date  . Eye surgery  sept. 2015    cataract surgery  . Femur im nail Right 01/18/2015    Procedure: INTRAMEDULLARY (IM) RETROGRADE FEMORAL NAILING;  Surgeon: Renette Butters, MD;  Location: [place];  Service: Orthopedics;  Laterality: Right;  . Fracture surgery    . Appendectomy    . Inguinal hernia repair  1970's?  . Cataract extraction w/ intraocular lens  implant, bilateral Bilateral   . Cystoscopy w/ ureteral stent placement Right 04/12/2015    Procedure: RIGHT RETROGRADE PYELOGRAM STENT PLACEMENT;  Surgeon: Kathie Rhodes, MD;  Location: [facility];  Service: Urology;  Laterality: Right;    Kallie Locks, MS, RD, LDN Pager # [phone] After hours/ weekend pager # [phone]

## 2015-04-17 NOTE — Progress Notes (Signed)
[name] PROGRESS NOTE  Douglas [name] GDJ:[id number] DOB: [birth year] DOA: 04/10/2015 PCP: [name]., MD  Assessment/Plan: 81 year old with PMH significant for prostate cancer 2009 (post status of radiation therapy), gastroparesis, chronic kidney disease-IV-V, history of C. difficile colitis (completed treatment, repeated C. difficile PCR negative on 03/17/15), who presents with generalized weakness and abdominal pain. Recent treated for UTI.  In ED, patient was found to have potassium of 5.6 without T wave peaking on EKG, AoCKD-IV-V, creatinine up from baseline 2.0-3.0 to 5.59, WBC 20.1, tachycardia, temperature 98.1, urinalysis is positive for UTI. Chest x-ray is negative for acute abnormalities. CT abdomen pelvis showed obstructive uropathy.  He underwent  double J stent placement 4-15. Culture is only growing yeast. Renal function improved with fluids and with stent place,ment. Patient also develops diarrhea. C diff positive. Ceftriaxone was discontinue. He was started on oral vancomycin. C diff infection has improved. He has been complaining of dyspnea. Fluids stop. He received 40 mg IV lasix on 4-18. Will repeat lasix dose on 4/20.  Disposition: home when respiratory status improved.   1-C diff colitis:  C diff positive.  Started on oral vancomycin on 4-16.   Diarrhea improving    2-Sepsis, UTI, pyelonephritis.  CT abdomen showing Obstructive uropathy.  S/P  double J stent placement.  Urine culture grew yeast. Continue with  Fluconazole day 6/7.  Anaerobic culture:  few yeast.  Received ceftriaxone for 3 days. Urine culture only growing yeast.  -no dysuria  3-Acute on chronic renal failure stage IV; obstructive uropathy: Cr baseline 2 to 3.  Worsening renal function secondary to  obstructive uropathy.   Urology consulted.  Metabolic acidosis resolved.  S/P double J stent placement.  Creatinine slowly trending down (3.5 on 4/20) Calcium corrected by albumin at : 7.6.   Started on calcium supplement.  Will repeat BMET in am.   4-Acute Hypoxic Resp Failure; requiring 2 L oxygen. Lungs with crackles. Received 40 mg IV lasix 4-18. Repeat lasix dose 4-19 and 4/20 -IVF's changed to NSL -will titrate O2 to off.   5-Anemia:  -Continue iron supplements - S/P 2 unit PRBC during this admission. -Hgb stable now; will monitor trend  6-Severe Protein calorie malnutrition: -consult to nutrition team -will follow feeding supplementation recommendations.  7-Generalized weakness: It is most likely due to deconditioning secondary to multiple comorbidities. -S/P transfusion 2 units PRBC.  -TSH elevated at 4.7.  free T 3 low at 1.5 and T 4 normal.  -patient started on low dose synthroid   8-Hyperkalemia; in setting of renal failure.  Received kayexalate. k has decreased and is WNL now.  Will monitor  9-Abdominal pain; CT showed, kidney stone.  -complaining of full sensation; KUB done and no signs of obstruction appreciated  10-Diabetes: holding levemir.  -will continue SSI.  -Hypoglycemia. Resolved.  -patient not eating much; will monitor  11-chronic left heel wound; wound care consulted.  -will follow rec's  Code Status: DNR Family Communication: care discussed with Wife and daughter at bedside Disposition Plan: home in 24 to 48 hours depending on renal function and respiratory status.    Consultants: Urology  Procedures:  CT^ scan.   Antibiotics:  Ceftriaxone-stop 4-17  Fluconazole  Oral vancomycin 4-16  HPI/Subjective: Still with very poor appetite, complaining of full sensation and nausea; diarrhea is better.     Objective: Filed Vitals:   04/17/15 2142  BP: 124/84  Pulse: 91  Temp: 98 F (36.7 C)  Resp: 17    Intake/Output Summary (  Last 24 hours) at 04/17/15 2209 Last data filed at 04/17/15 1700  Gross per 24 hour  Intake    280 ml  Output    650 ml  Net   -370 ml   Filed Weights   04/14/15 2115 04/15/15 2128 04/17/15  2142  Weight: 55.9 kg (123 lb 3.8 oz) 60.52 kg (133 lb 6.8 oz) 61.02 kg (134 lb 8.4 oz)    Exam:   General:  Oriented to person and place; afebrile and complaining of full abd sensation. No vomiting, no CP.  Cardiovascular: S 1, S 2 RRR; no rubs or gallops  Respiratory: fine Crackles Bilaterally; no tachypnea   Abdomen: BS present, soft, NT, mild distension on exam  Musculoskeletal: trace edema LE bilaterally, patient with signs of bilateral calf atrophy  Data Reviewed: Basic Metabolic Panel:  Recent Labs Lab 04/13/15 0440 04/14/15 0423 04/15/15 0531 04/16/15 1248 04/17/15 0534  NA 139 137 138 141 140  K 3.2* 3.6 4.3 4.2 4.1  CL 104 104 106 106 105  CO2 22 21 19 25 23   GLUCOSE 198* 115* 115* 122* 118*  BUN 62* 53* 51* 48* 45*  CREATININE 4.44* 3.92* 3.77* 3.74* 3.58*  CALCIUM 6.4* 6.2* 6.8* 7.4* 7.6*  PHOS  --   --   --   --  3.6   Liver Function Tests:  Recent Labs Lab 04/11/15 0628 04/17/15 0534  AST 59*  --   ALT 29  --   ALKPHOS 128*  --   BILITOT 0.2*  --   PROT 7.8  --   ALBUMIN 2.2* 2.1*   CBC:  Recent Labs Lab 04/13/15 0440 04/14/15 0423 04/15/15 0531 04/16/15 1248 04/17/15 0534  WBC 7.8 6.3 8.4 7.9 6.9  HGB 8.1* 7.6* 10.5* 9.5* 10.1*  HCT 25.3* 23.9* 33.4* 29.9* 32.7*  MCV 86.1 87.9 87.4 87.9 88.9  PLT 145* 127* 122* 144* 136*   BNP (last 3 results)  Recent Labs  02/26/15 1726  BNP 187.0*   CBG:  Recent Labs Lab 04/16/15 2159 04/17/15 0829 04/17/15 1200 04/17/15 1635 04/17/15 2137  GLUCAP 146* 108* 127* 163* 135*    Recent Results (from the past 240 hour(s))  Urine culture     Status: None   Collection Time: 04/10/15  6:06 PM  Result Value Ref Range Status   Specimen Description URINE, CATHETERIZED  Final   Special Requests NONE  Final   Colony Count   Final    75,000 COLONIES/ML Performed at [facility]    Culture YEAST Performed at [facility]   Final   Report Status 04/12/2015 FINAL  Final   MRSA PCR Screening     Status: Abnormal   Collection Time: 04/10/15  8:45 PM  Result Value Ref Range Status   MRSA by PCR POSITIVE (A) NEGATIVE Final    Comment:        The GeneXpert MRSA Assay (FDA approved for NASAL specimens only), is one component of a comprehensive MRSA colonization surveillance program. It is not intended to diagnose MRSA infection nor to guide or monitor treatment for MRSA infections. RESULT CALLED TO, READ BACK BY AND VERIFIED WITH: CALLED TO RN REBECCA QIU [id number] @0026  THANEY   Culture, blood (x 2)     Status: None   Collection Time: 04/10/15  9:33 PM  Result Value Ref Range Status   Specimen Description BLOOD RIGHT ANTECUBITAL  Final   Special Requests BOTTLES DRAWN AEROBIC ONLY 4CC  Final   Culture  Final    NO GROWTH 5 DAYS Performed at [facility]    Report Status 04/17/2015 FINAL  Final  Culture, blood (x 2)     Status: None   Collection Time: 04/10/15  9:49 PM  Result Value Ref Range Status   Specimen Description BLOOD RIGHT HAND  Final   Special Requests BOTTLES DRAWN AEROBIC ONLY 5CC  Final   Culture   Final    NO GROWTH 5 DAYS Performed at [facility]    Report Status 04/17/2015 FINAL  Final  Anaerobic culture     Status: None   Collection Time: 04/12/15  1:57 PM  Result Value Ref Range Status   Specimen Description   Final    URINE, CATHETERIZED RIGHT RENAL PELVIS Performed at [facility]   Final    PATIENT ON FOLLOWING ROCEPHIN Performed at [facility]    Gram Stain   Final    ABUNDANT WBC PRESENT,BOTH PMN AND MONONUCLEAR NO SQUAMOUS EPITHELIAL CELLS SEEN NO ORGANISMS SEEN FEW YEAST Performed at [facility]    Culture   Final    NO ANAEROBES ISOLATED Performed at [facility]    Report Status 04/17/2015 FINAL  Final  Urine culture     Status: None   Collection Time: 04/12/15  1:57 PM  Result Value Ref Range Status    Specimen Description   Final    URINE, CATHETERIZED RIGHT RENAL PELVIS Performed at [facility]   Final    PATIENT ON FOLLOWING ROCEPHIN Performed at [place]   Final    >=100,000 COLONIES/ML Performed at [place] Performed at [facility]   Final   Report Status 04/14/2015 FINAL  Final  Clostridium Difficile by PCR     Status: Abnormal   Collection Time: 04/13/15  5:56 PM  Result Value Ref Range Status   C difficile by pcr POSITIVE (A) NEGATIVE Final    Comment: CRITICAL RESULT CALLED TO, READ BACK BY AND VERIFIED WITH: A.MACROHON,RN 04/13/15 @2010  BY V.WILKINS      Studies: Dg Abd Portable 1v  04/17/2015   CLINICAL DATA:  Abdominal distention  EXAM: PORTABLE ABDOMEN - 1 VIEW  COMPARISON:  04/10/2005  FINDINGS: Scattered large and small bowel gas is noted. A right ureteral stent is noted with proximal right ureteral stone stable from the prior CT examination. No acute bony abnormality is noted.  IMPRESSION: Proximal right ureteral stone as well as right ureteral stent in place.   Electronically Signed   By: Inez Catalina M.D.   On: 04/17/2015 21:49    Scheduled Meds: . antiseptic oral rinse  7 mL Mouth Rinse BID  . atorvastatin  40 mg Oral Daily  . calcium-vitamin D  1 tablet Oral BID  . Chlorhexidine Gluconate Cloth  6 each Topical Q0600  . collagenase   Topical Daily  . [START ON 04/18/2015] feeding supplement (ENSURE)  1 Container Oral BID BM  . [START ON 04/18/2015] feeding supplement (NEPRO CARB STEADY)  237 mL Oral BID BM  . feeding supplement (PRO-STAT SUGAR FREE 64)  30 mL Oral TID BM  . fluconazole  100 mg Oral Q24H  . gabapentin  300 mg Oral QHS  . insulin aspart  0-9 Units Subcutaneous TID WC  . iron polysaccharides  150 mg Oral Daily  .  ketorolac  1 drop Both Eyes TID  . levothyroxine  25 mcg Oral QAC breakfast  . metoCLOPramide  5 mg Oral TID AC &  HS  . mupirocin ointment  1 application Nasal BID  . tamsulosin  0.4 mg Oral Daily  . vancomycin  125 mg Oral QID   Continuous Infusions:    Principal Problem:   Hyperkalemia Active Problems:   Hypertension   Hyperlipidemia   Anemia, chronic disease   Gastroparesis due to DM: DELAYED GASTRIC EMPTYING STUDY 02/08/15   Sepsis secondary to UTI   UTI (lower urinary tract infection)   C. difficile colitis   Protein-calorie malnutrition, severe   Acute renal failure superimposed on stage 5 chronic kidney disease   Abdominal pain   Generalized weakness    Time spent: 35 minutes.     Barton Dubois  [name] Pager [phone] If 7PM-7AM, please contact night-coverage at [email or web address], password [facility] 04/17/2015, 10:09 PM  LOS: 7 days

## 2015-04-18 LAB — GLUCOSE, CAPILLARY
GLUCOSE-CAPILLARY: 106 mg/dL — AB (ref 70–99)
Glucose-Capillary: 145 mg/dL — ABNORMAL HIGH (ref 70–99)
Glucose-Capillary: 133 mg/dL — ABNORMAL HIGH (ref 70–99)
Glucose-Capillary: 131 mg/dL — ABNORMAL HIGH (ref 70–99)
Glucose-Capillary: 127 mg/dL — ABNORMAL HIGH (ref 70–99)
Glucose-Capillary: 112 mg/dL — ABNORMAL HIGH (ref 70–99)

## 2015-04-18 LAB — BASIC METABOLIC PANEL
Anion gap: 11 (ref 5–15)
BUN: 44 mg/dL — AB (ref 6–23)
CALCIUM: 7.5 mg/dL — AB (ref 8.4–10.5)
CHLORIDE: 106 mmol/L (ref 96–112)
CO2: 25 mmol/L (ref 19–32)
CREATININE: 3.32 mg/dL — AB (ref 0.50–1.35)
GFR calc Af Amer: 19 mL/min — ABNORMAL LOW (ref >90)
GFR calc non Af Amer: 16 mL/min — ABNORMAL LOW (ref >90)
GLUCOSE: 111 mg/dL — AB (ref 70–99)
Potassium: 4 mmol/L (ref 3.5–5.1)
Sodium: 142 mmol/L (ref 135–145)

## 2015-04-18 MED ORDER — SACCHAROMYCES BOULARDII 250 MG PO CAPS
250.0000 mg | ORAL_CAPSULE | Freq: Two times a day (BID) | ORAL | Status: DC
  Administered 2015-04-18 – 2015-04-19 (×2): 250 mg via ORAL
  Filled 2015-04-18 (×3): qty 1

## 2015-04-18 MED ORDER — SIMETHICONE 40 MG/0.6ML PO SUSP
40.0000 mg | Freq: Four times a day (QID) | ORAL | Status: DC | PRN
  Administered 2015-04-18: 40 mg via ORAL
  Filled 2015-04-18 (×2): qty 0.6

## 2015-04-18 NOTE — Progress Notes (Signed)
PT Cancellation Note  Patient Details Name: Douglas [name] MRN: [id number] DOB: [birth year]   Cancelled Treatment:    Reason Eval/Treat Not Completed: Fatigue/lethargy limiting ability to participate.  Per wife and sitter, patient up most of day.  Recently returned to bed and sleeping.  Unable to arouse easily.  Will return in am for PT session with wife.   Despina Pole 04/18/2015, 6:39 PM Carita Pian. Sanjuana Kava, [place] Pager [phone]

## 2015-04-18 NOTE — Progress Notes (Signed)
[name] PROGRESS NOTE  Douglas [name] OZD:[id number] DOB: [birth year] DOA: 04/10/2015 PCP: [name]., MD  Assessment/Plan: 81 year old with PMH significant for prostate cancer 2009 (post status of radiation therapy), gastroparesis, chronic kidney disease-IV-V, history of C. difficile colitis (completed treatment, repeated C. difficile PCR negative on 03/17/15), who presents with generalized weakness and abdominal pain. Recent treated for UTI.  In ED, patient was found to have potassium of 5.6 without T wave peaking on EKG, AoCKD-IV-V, creatinine up from baseline 2.0-3.0 to 5.59, WBC 20.1, tachycardia, temperature 98.1, urinalysis is positive for UTI. Chest x-ray is negative for acute abnormalities. CT abdomen pelvis showed obstructive uropathy.  He underwent  double J stent placement 4-15. Culture is only growing yeast. Renal function improved with fluids and with stent place,ment. Patient also develops diarrhea. C diff positive. Ceftriaxone was discontinue. He was started on oral vancomycin. C diff infection has improved. He has been complaining of dyspnea. Fluids stop. He received 40 mg IV lasix on 4-18. Will repeat lasix dose on 4/20.  Disposition: home when respiratory status improved.   1-C diff colitis:  C diff positive.  Started on oral vancomycin on 4-16.   Diarrhea improving    2-Sepsis, UTI, pyelonephritis.  CT abdomen showing Obstructive uropathy.  S/P  double J stent placement.  Urine culture grew yeast. Continue with  Fluconazole day 7/7.  Anaerobic culture:  few yeast.  Received ceftriaxone for 3 days. Urine culture only growing yeast.  -no dysuria  3-Acute on chronic renal failure stage IV; obstructive uropathy: Cr baseline 2 to 3.  Worsening renal function secondary to  obstructive uropathy.   Urology consulted.  Metabolic acidosis resolved.  S/P double J stent placement.  Creatinine slowly trending down (3.32 on 4/21) Calcium corrected by albumin at : 7.6.   Started on calcium supplement.  Will repeat BMET in am.  Will discontinue foley  4-Acute Hypoxic Resp Failure; requiring 1-2 L oxygen. Lungs with fine crackles. Received 40 mg IV lasix 4-18. Repeat lasix dose 4-19 and 4/20 -IVF's changed to NSL -will titrate O2 to off.  -continue IV lasix for one more day  5-Anemia:  -Continue iron supplements - S/P 2 unit PRBC during this admission. -Hgb stable now; will monitor trend  6-Severe Protein calorie malnutrition: -consult to nutrition team in placed, appreciate assistance and rec's -will follow feeding supplementation recommendations.  7-Generalized weakness: It is most likely due to deconditioning secondary to multiple comorbidities. -S/P transfusion 2 units PRBC.  -TSH elevated at 4.7.  free T 3 low at 1.5 and T 4 normal.  -patient started on low dose synthroid  -follow up thyroid function as an outpatient  8-Hyperkalemia; in setting of renal failure.  Received kayexalate. k has decreased and is WNL now.  Will monitor  9-Abdominal pain; CT showed, kidney stone.  -complaining of full sensation; KUB done and no signs of obstruction appreciated -will use simethicone PRN -patient encourage to get out of bed and be more active   10-Diabetes: holding levemir.  -will continue SSI.  -Hypoglycemia. Resolved.  -patient not eating much; will monitor CBG's  11-chronic left heel wound; wound care consulted.  -will follow rec's  Code Status: DNR Family Communication: care discussed with Wife and daughter at bedside Disposition Plan: home in am if remains stable. Will d/c foley    Consultants: Urology  Procedures:  CT^ scan.   Antibiotics:  Ceftriaxone-stop 4-17  Fluconazole  Oral vancomycin 4-16  HPI/Subjective: Still with very poor appetite; but per family  eating better today. Still with some diarrhea but improved. No fever, no nausea, no vomiting.     Objective: Filed Vitals:   04/18/15 1700  BP: 135/71  Pulse:  98  Temp: 98.2 F (36.8 C)  Resp: 18    Intake/Output Summary (Last 24 hours) at 04/18/15 1854 Last data filed at 04/18/15 1830  Gross per 24 hour  Intake    580 ml  Output    978 ml  Net   -398 ml   Filed Weights   04/14/15 2115 04/15/15 2128 04/17/15 2142  Weight: 55.9 kg (123 lb 3.8 oz) 60.52 kg (133 lb 6.8 oz) 61.02 kg (134 lb 8.4 oz)    Exam:   General:  Oriented to person and place; afebrile and w/o nausea or vomiting. Patient is feeling better and per family members having a good day, asking to be seated on the chair and has had better PO intake. No CP.  Cardiovascular: S 1, S 2 RRR; no rubs or gallops, no JVD  Respiratory: very fine Crackles Bilaterally; no tachypnea   Abdomen: BS present, soft, NT, mild distension on exam  Musculoskeletal: trace edema LE bilaterally, patient with signs of bilateral calf atrophy  Data Reviewed: Basic Metabolic Panel:  Recent Labs Lab 04/14/15 0423 04/15/15 0531 04/16/15 1248 04/17/15 0534 04/18/15 0630  NA 137 138 141 140 142  K 3.6 4.3 4.2 4.1 4.0  CL 104 106 106 105 106  CO2 21 19 25 23 25   GLUCOSE 115* 115* 122* 118* 111*  BUN 53* 51* 48* 45* 44*  CREATININE 3.92* 3.77* 3.74* 3.58* 3.32*  CALCIUM 6.2* 6.8* 7.4* 7.6* 7.5*  PHOS  --   --   --  3.6  --    Liver Function Tests:  Recent Labs Lab 04/17/15 0534  ALBUMIN 2.1*   CBC:  Recent Labs Lab 04/13/15 0440 04/14/15 0423 04/15/15 0531 04/16/15 1248 04/17/15 0534  WBC 7.8 6.3 8.4 7.9 6.9  HGB 8.1* 7.6* 10.5* 9.5* 10.1*  HCT 25.3* 23.9* 33.4* 29.9* 32.7*  MCV 86.1 87.9 87.4 87.9 88.9  PLT 145* 127* 122* 144* 136*   BNP (last 3 results)  Recent Labs  02/26/15 1726  BNP 187.0*   CBG:  Recent Labs Lab 04/17/15 1635 04/17/15 2137 04/18/15 0755 04/18/15 1105 04/18/15 1638  GLUCAP 163* 135* 106* 131* 127*    Recent Results (from the past 240 hour(s))  Urine culture     Status: None   Collection Time: 04/10/15  6:06 PM  Result Value Ref  Range Status   Specimen Description URINE, CATHETERIZED  Final   Special Requests NONE  Final   Colony Count   Final    75,000 COLONIES/ML Performed at [place] Performed at [facility]   Final   Report Status 04/12/2015 FINAL  Final  MRSA PCR Screening     Status: Abnormal   Collection Time: 04/10/15  8:45 PM  Result Value Ref Range Status   MRSA by PCR POSITIVE (A) NEGATIVE Final    Comment:        The GeneXpert MRSA Assay (FDA approved for NASAL specimens only), is one component of a comprehensive MRSA colonization surveillance program. It is not intended to diagnose MRSA infection nor to guide or monitor treatment for MRSA infections. RESULT CALLED TO, READ BACK BY AND VERIFIED WITH: CALLED TO RN REBECCA QIU [id number] @0026  THANEY   Culture, blood (x 2)     Status: None  Collection Time: 04/10/15  9:33 PM  Result Value Ref Range Status   Specimen Description BLOOD RIGHT ANTECUBITAL  Final   Special Requests BOTTLES DRAWN AEROBIC ONLY 4CC  Final   Culture   Final    NO GROWTH 5 DAYS Performed at [facility]    Report Status 04/17/2015 FINAL  Final  Culture, blood (x 2)     Status: None   Collection Time: 04/10/15  9:49 PM  Result Value Ref Range Status   Specimen Description BLOOD RIGHT HAND  Final   Special Requests BOTTLES DRAWN AEROBIC ONLY 5CC  Final   Culture   Final    NO GROWTH 5 DAYS Performed at [facility]    Report Status 04/17/2015 FINAL  Final  Anaerobic culture     Status: None   Collection Time: 04/12/15  1:57 PM  Result Value Ref Range Status   Specimen Description   Final    URINE, CATHETERIZED RIGHT RENAL PELVIS Performed at [facility]   Final    PATIENT ON FOLLOWING ROCEPHIN Performed at [facility]    Gram Stain   Final    ABUNDANT WBC PRESENT,BOTH PMN AND MONONUCLEAR NO SQUAMOUS EPITHELIAL CELLS SEEN NO ORGANISMS  SEEN FEW YEAST Performed at [facility]    Culture   Final    NO ANAEROBES ISOLATED Performed at [facility]    Report Status 04/17/2015 FINAL  Final  Urine culture     Status: None   Collection Time: 04/12/15  1:57 PM  Result Value Ref Range Status   Specimen Description   Final    URINE, CATHETERIZED RIGHT RENAL PELVIS Performed at [place] [facility]   Final    PATIENT ON FOLLOWING ROCEPHIN Performed at [place]   Final    >=100,000 COLONIES/ML Performed at [place] Performed at [facility]   Final   Report Status 04/14/2015 FINAL  Final  Clostridium Difficile by PCR     Status: Abnormal   Collection Time: 04/13/15  5:56 PM  Result Value Ref Range Status   C difficile by pcr POSITIVE (A) NEGATIVE Final    Comment: CRITICAL RESULT CALLED TO, READ BACK BY AND VERIFIED WITH: A.MACROHON,RN 04/13/15 @2010  BY V.WILKINS      Studies: Dg Abd Portable 1v  04/17/2015   CLINICAL DATA:  Abdominal distention  EXAM: PORTABLE ABDOMEN - 1 VIEW  COMPARISON:  04/10/2005  FINDINGS: Scattered large and small bowel gas is noted. A right ureteral stent is noted with proximal right ureteral stone stable from the prior CT examination. No acute bony abnormality is noted.  IMPRESSION: Proximal right ureteral stone as well as right ureteral stent in place.   Electronically Signed   By: Inez Catalina M.D.   On: 04/17/2015 21:49    Scheduled Meds: . antiseptic oral rinse  7 mL Mouth Rinse BID  . atorvastatin  40 mg Oral Daily  . calcium-vitamin D  1 tablet Oral BID  . Chlorhexidine Gluconate Cloth  6 each Topical Q0600  . collagenase   Topical Daily  . feeding supplement (ENSURE)  1 Container Oral BID BM  . feeding supplement (NEPRO CARB STEADY)  237 mL Oral BID BM  . feeding supplement (PRO-STAT SUGAR FREE 64)  30 mL Oral TID BM  . gabapentin  300 mg  Oral QHS  .  insulin aspart  0-9 Units Subcutaneous TID WC  . iron polysaccharides  150 mg Oral Daily  . ketorolac  1 drop Both Eyes TID  . levothyroxine  25 mcg Oral QAC breakfast  . metoCLOPramide  5 mg Oral TID AC & HS  . mupirocin ointment  1 application Nasal BID  . tamsulosin  0.4 mg Oral Daily  . vancomycin  125 mg Oral QID   Continuous Infusions:    Principal Problem:   Hyperkalemia Active Problems:   Hypertension   Hyperlipidemia   Anemia, chronic disease   Gastroparesis due to DM: DELAYED GASTRIC EMPTYING STUDY 02/08/15   Sepsis secondary to UTI   UTI (lower urinary tract infection)   C. difficile colitis   Protein-calorie malnutrition, severe   Acute renal failure superimposed on stage 5 chronic kidney disease   Abdominal pain   Generalized weakness    Time spent: 30 minutes.     Barton Dubois  [name] Pager [phone] If 7PM-7AM, please contact night-coverage at [email or web address], password [facility] 04/18/2015, 6:54 PM  LOS: 8 days

## 2015-04-18 NOTE — Progress Notes (Signed)
Occupational Therapy Treatment Patient Details Name: Douglas [name] MRN: [id number] DOB: Mar 10, 1931 Today's Date: 04/18/2015    History of present illness Patient is an 81 y/o male who had right femur fracure with IM nail in January this year.  Since has had difficulty with urinary retention with history of prostate cancer and recent UTI, C-diff colitis.  Now with Acute on chronic renal failure stage IV; obstructive uropathy and urosepsis with pyelonephritis.  For retrugrade pyelogram and double J stent today.   OT comments  Patient received up in chair, able to perform grooming/feeding tasks from chair level with setup today. Bed mobility not assessed as patient already OOB. Biggest concern is patient's ability to transfer. He has been requiring significant assistance with transfers by therapy and nursing staff, but wife reports he usually transfers himself and does not anticipate it being a problem at home. Patient's wife admits she has seen that he requires increased assistance and has not attempted to transfer him on her own. Relayed my concerns to PT who will address in the patient's next PT session today.  Follow Up Recommendations  Home health OT;Supervision/Assistance - 24 hour    Equipment Recommendations  None recommended by OT    Recommendations for Other Services PT consult    Precautions / Restrictions Precautions Precautions: Fall Restrictions Weight Bearing Restrictions: No RLE Weight Bearing: Weight bearing as tolerated       Mobility Bed Mobility                  Transfers                      Balance                                   ADL Overall ADL's : Needs assistance/impaired Eating/Feeding: Sitting;Set up (per family report)   Grooming: Wash/dry hands;Wash/dry face;Oral care;Set up;Sitting                                 General ADL Comments: Patient received up in recliner, wife and daughter at bedside.  They report he is having a "good day" and may go home tomorrow. Patient reports the nurse tech lifted him in the chair this am. Discussion with patient and family regarding transfers. Patient's wife reports she does not feel she will need a hoyer lift at home because patient has been able to perform transfers independently at home. Attempted to educate patient's wife that patient has not been independent with his transfers with PT at this point, that he is requiring mod A with his knees buckling. Do not feel patient's wife understands that patient's transfer ability may be decreased during this admission. Relayed my concerns to PT who will address in her session this afternoon. Patient was able to perform grooming and feeding today. Bed mobility goal not addressed as patient already in chair. Will continue OT per plan of care.      Vision                     Perception     Praxis      Cognition   Behavior During Therapy: Flat affect Overall Cognitive Status: Within Functional Limits for tasks assessed  Extremity/Trunk Assessment               Exercises     Shoulder Instructions       General Comments      Pertinent Vitals/ Pain       Pain Assessment: No/denies pain  Home Living                                          Prior Functioning/Environment              Frequency Min 2X/week     Progress Toward Goals  OT Goals(current goals can now be found in the care plan section)  Progress towards OT goals: Progressing toward goals  Acute Rehab OT Goals Patient Stated Goal: return home  Plan Discharge plan remains appropriate    Co-evaluation                 End of Session     Activity Tolerance Patient tolerated treatment well   Patient Left in chair;with call bell/phone within reach;with family/visitor present   Nurse Communication          Time: 0940-7680 OT Time Calculation (min): 26  min  Charges: OT General Charges $OT Visit: 1 Procedure OT Treatments $Self Care/Home Management : 23-37 mins  Douglas [name] A 04/18/2015, 12:36 PM

## 2015-04-18 NOTE — Plan of Care (Signed)
Problem: Phase I Progression Outcomes Goal: OOB as tolerated unless otherwise ordered Outcome: Progressing Pt OOB to recliner with 2 person assist for 1 hour.

## 2015-04-19 DIAGNOSIS — E1143 Type 2 diabetes mellitus with diabetic autonomic (poly)neuropathy: Secondary | ICD-10-CM

## 2015-04-19 DIAGNOSIS — R140 Abdominal distension (gaseous): Secondary | ICD-10-CM | POA: Insufficient documentation

## 2015-04-19 LAB — GLUCOSE, CAPILLARY
Glucose-Capillary: 145 mg/dL — ABNORMAL HIGH (ref 70–99)
Glucose-Capillary: 107 mg/dL — ABNORMAL HIGH (ref 70–99)
Glucose-Capillary: 128 mg/dL — ABNORMAL HIGH (ref 70–99)

## 2015-04-19 MED ORDER — SACCHAROMYCES BOULARDII 250 MG PO CAPS: 250.0000 mg | ORAL_CAPSULE | Freq: Two times a day (BID) | ORAL | Status: AC

## 2015-04-19 MED ORDER — METOCLOPRAMIDE HCL 5 MG PO TABS: 5.0000 mg | ORAL_TABLET | Freq: Three times a day (TID) | ORAL | Status: AC

## 2015-04-19 MED ORDER — PRO-STAT SUGAR FREE PO LIQD: 30.0000 mL | Freq: Three times a day (TID) | ORAL | Status: AC

## 2015-04-19 MED ORDER — NEPRO/CARBSTEADY PO LIQD: 237.0000 mL | Freq: Two times a day (BID) | ORAL | Status: AC

## 2015-04-19 MED ORDER — SIMETHICONE 40 MG/0.6ML PO SUSP: 40.0000 mg | Freq: Four times a day (QID) | ORAL | Status: AC | PRN

## 2015-04-19 MED ORDER — VANCOMYCIN 50 MG/ML ORAL SOLUTION: 125.0000 mg | Freq: Four times a day (QID) | ORAL | Status: AC

## 2015-04-19 MED ORDER — LEVOTHYROXINE SODIUM 25 MCG PO TABS: 25.0000 ug | ORAL_TABLET | Freq: Every day | ORAL | Status: AC

## 2015-04-19 MED ORDER — COLLAGENASE 250 UNIT/GM EX OINT: TOPICAL_OINTMENT | Freq: Every day | CUTANEOUS | Status: AC

## 2015-04-19 NOTE — Progress Notes (Signed)
Patient Discharge:  Disposition: Pt discharged home with wife  Education: Pt and wife educated on medications, follow up appointments, and all discharge  instructions. Pt and wife verbalized understanding.   IV: Removed  Telemetry: Removed CCMD notified  Follow-up appointments: Instructed wife on pt's need for follow up with PCP in 10 days  Prescriptions: Scripts given to pt's wife and some sent to pt's pharmacy  Transportation: [facility]  Belongings:All belongings taken with pt.

## 2015-04-19 NOTE — Clinical Social Work Note (Signed)
Patient discharging home today and ambulance transport requested. CSW facilitated transport home, contacting [facility] to get patient home.  Zarai Orsborn Givens, MSW, LCSW Licensed Clinical Social Worker [place] [phone]

## 2015-04-19 NOTE — Progress Notes (Signed)
Patient ID: GAR GLANCE, male   DOB: 1931/10/08, 81 y.o.   MRN: [id number]  Mr. Douglas [name] is recovering from his recent episode of sepsis from pyelonephritis with an obstructing right ureteral stone and funguria.   He is voiding without complaints.   He is being discharged today.  ROS: -fever, -flank pain.   Exam Afeb vss           WD, thin WM in NAD.    Labs: Cr 3.32  Imp: Hx of right ureteral stone with obstruction and fungal pyelonephritis with sepsis now recovering post sent placement.  Rec:  He will need f/u in our [place] office for consideration of definitive therapy for the stone.

## 2015-04-19 NOTE — Discharge Summary (Signed)
Physician Discharge Summary  Douglas [name] XHB:[id number] DOB: 1931/11/26 DOA: 04/10/2015  PCP: [name]., MD  Admit date: 04/10/2015 Discharge date: 04/19/2015  Time spent: >30 minutes  Recommendations for Outpatient Follow-up:  1. Repeat BMET to follow electrolytes and renal function 2. Repeat CBC to follow Hgb trend 3. In 3-4 week repeat thyroid function test and make any adjustments to his synthroid dose as needed 4. Please make sure patient follow with urology service for final decisions on obstructive uropathy/stone (urology office supposedly will contact him with appointment details)  Discharge Diagnoses:  Principal Problem:   Hyperkalemia Active Problems:   Hypertension   Hyperlipidemia   Anemia, chronic disease   Gastroparesis due to DM: DELAYED GASTRIC EMPTYING STUDY 02/08/15   Sepsis secondary to UTI   UTI (lower urinary tract infection)   C. difficile colitis   Protein-calorie malnutrition, severe   Acute renal failure superimposed on stage 5 chronic kidney disease   Abdominal pain   Generalized weakness   Discharge Condition: stable. Still very weak and deconditioned. Family would like to continue HHPT program at home. [place] services arranged at discharge; patient to follow with PCP in 10 days. Also will follow with urology service for final decision on treatment of his stone/obstructive uropahty  Diet recommendation: modified carbohydrates diet  Filed Weights   04/15/15 2128 04/17/15 2142 04/18/15 2057  Weight: 60.52 kg (133 lb 6.8 oz) 61.02 kg (134 lb 8.4 oz) 56.291 kg (124 lb 1.6 oz)    History of present illness:  81 y.o. male with past medical history for hypertension, hyperlipidemia, diabetes mellitus, anemia, peripheral neuropathy, upper GI bleeding, prostate cancer 2009 (post status of radiation therapy), gastroparesis, chronic kidney disease-IV-V, history of C. difficile colitis (completed treatment, repeated C. difficile PCR negative on 03/17/15), who  presents with generalized weakness and abdominal pain. Patient found to have sepsis from obstructive uropathy, worsening anemia and positive C. diff  Hospital Course:  1-C diff colitis:  C diff positive.  Started on oral vancomycin on 4-16.  Diarrhea improving  Discharge also on florastor -patient will use vancomycin for another 10 days  2-Sepsis, UTI, pyelonephritis/funguria  CT abdomen showing Obstructive uropathy.  S/P double J stent placement.  Urine culture grew yeast.  Treated with Fluconazole for 7 days Anaerobic culture: few yeast.  Received also treatment with ceftriaxone for 3 days; given only yeast in cultures and the fact he had c. Diff colitis decision was taken to treat with fluconazole and stop antibiotics for urine. -no dysuria at discharge  3-Acute on chronic renal failure stage IV; obstructive uropathy: Cr baseline 2 to 3.  Worsening renal function secondary to obstructive uropathy.  Urology consulted.  Metabolic acidosis resolved.  S/P double J stent placement.  Creatinine slowly trending down (3.32 on 4/21) Calcium corrected by albumin at : 7.6. Started on calcium supplement.  Will repeat BMET during follow up visit Will follow with urology group in [place] for final decision on stone Able to void w/o issues after foley was removed   4-Acute Hypoxic Resp Failure; requiring 1-2 L oxygen. Lungs with fine crackles. Most likely flash pulmonary in setting of IVF's and blood transfusion. -treated successfully with IV lasix and oxygen supplementation -at discharges no crackles on exam and good O2 sat on RA  5-Anemia: of chronic disease (due to renal failure) -Continue iron supplements - S/P 2 unit PRBC during this admission. -Hgb remains stable  6-Severe Protein calorie malnutrition: -dietitian consulted; appreciate assistance, education and help provided to patient/family -will  follow feeding supplementation recommended  -patient en  encourage to use multiple small meals and to maintain adequate hydration  7-Generalized weakness/deconditioning: It is most likely due to deconditioning secondary to multiple comorbidities. -S/P transfusion 2 units PRBC.  -TSH elevated at 4.7. free T 3 low at 1.5 and T 4 normal.  -patient started on low dose synthroid  -follow up thyroid function and CBC as an outpatient -will continue HHPT/OT and will also arrange for [facility] social worker at discharge  8-Hyperkalemia; in setting of renal failure.  Received kayexalate. k has responded well and now that his renal function is back to baseline has remained WNL   Will monitor BMET during follow up visit  9-Abdominal pain; CT showed, kidney stone.  -complaining of full sensation; KUB done and no signs of obstruction appreciated; stable stone and stable double J stent placement. -will use simethicone PRN -patient encourage to get out of bed and be more active  -dyspepsia from ongoing C. Diff colitis contributing   10-Diabetes:  -mild hypoglycemic event during inpatient state; resolved and never recurred -patient will continue home hypoglycemic regimen -eating a little bit better at discharge -encourage not to skip meals and to use feeding supplements   11-chronic left heel wound; wound care consulted.  -will follow their rec's -HHRN arranged to assist with care and dressing   Procedures:  Bilateral stent placement   Consultations:  IR  Urology   Discharge Exam: Filed Vitals:   04/19/15 0820  BP: 122/75  Pulse: 93  Temp: 98.9 F (37.2 C)  Resp: 18    General: feeling better, afebrile and with good O2 sat on RA. No nausea or vomiting. No chest pain. Appetite remains poor. Able to void w/o problems, no hematuria.  Cardiovascular: S 1, S 2 RRR; no rubs or gallops, no JVD  Respiratory: no crackles; no tachypnea   Abdomen: BS present, soft, NT, mild distension on exam  Musculoskeletal: trace edema LE bilaterally,  patient with signs of bilateral calf atrophy and heel pressure ulcers bilaterally (L > R)   Discharge Instructions   Discharge Instructions    Discharge instructions    Complete by:  As directed   Take medications as prescribed Arrange follow up with PCP in 10 days Maintain yourself well hydrated and increase PO intake Continue following home health services instructions Watch Carbohydrates level in your diet          Current Discharge Medication List    START taking these medications   Details  Amino Acids-Protein Hydrolys (FEEDING SUPPLEMENT, PRO-STAT SUGAR FREE 64,) LIQD Take 30 mLs by mouth 3 (three) times daily between meals. Qty: 900 mL, Refills: 0    collagenase (SANTYL) ointment Apply topically daily. Apply on daily basis to heel/foot wounds. Qty: 15 g, Refills: 0    levothyroxine (SYNTHROID, LEVOTHROID) 25 MCG tablet Take 1 tablet (25 mcg total) by mouth daily before breakfast. Qty: 30 tablet, Refills: 0    Nutritional Supplements (FEEDING SUPPLEMENT, NEPRO CARB STEADY,) LIQD Take 237 mLs by mouth 2 (two) times daily between meals. Qty: 237 mL, Refills: 1    simethicone (MYLICON) 40 SW/5.4OE drops Take 0.6 mLs (40 mg total) by mouth 4 (four) times daily as needed for flatulence. Qty: 30 mL, Refills: 0    vancomycin (VANCOCIN) 50 mg/mL oral solution Take 2.5 mLs (125 mg total) by mouth 4 (four) times daily. Qty: 140 mL, Refills: 0      CONTINUE these medications which have CHANGED   Details  metoCLOPramide (  REGLAN) 5 MG tablet Take 1 tablet (5 mg total) by mouth 3 (three) times daily before meals. Qty: 90 tablet, Refills: 1    saccharomyces boulardii (FLORASTOR) 250 MG capsule Take 1 capsule (250 mg total) by mouth 2 (two) times daily. Qty: 60 capsule, Refills: 1      CONTINUE these medications which have NOT CHANGED   Details  acetaminophen (TYLENOL) 500 MG tablet Take 500 mg by mouth every 6 (six) hours as needed for mild pain or moderate pain.     atorvastatin (LIPITOR) 40 MG tablet Take 40 mg by mouth daily.    gabapentin (NEURONTIN) 300 MG capsule Take 1 capsule (300 mg total) by mouth at bedtime. Qty: 30 capsule, Refills: 1    insulin aspart (NOVOLOG) 100 UNIT/ML injection insulin aspart (novoLOG) injection 0-15 Units 0-15 Units, Subcutaneous, 3 times daily with meals CBG 70 - 120: 0 units CBG 121 - 150: 2 units CBG 151 - 200: 3 units CBG 201 - 250: 5 units CBG 251 - 300: 8 units CBG 301 - 350: 11 units CBG 351 - 400: 15 units CBG > 400: call MD Qty: 10 mL, Refills: 11    insulin detemir (LEVEMIR) 100 UNIT/ML injection Inject 0.1 mLs (10 Units total) into the skin at bedtime. Qty: 10 mL, Refills: 11    ketorolac (ACULAR) 0.5 % ophthalmic solution Place 1 drop into both eyes 3 (three) times daily.    metoprolol tartrate (LOPRESSOR) 25 MG tablet Take 0.5 tablets (12.5 mg total) by mouth 2 (two) times daily. Qty: 60 tablet, Refills: 1    oxybutynin (DITROPAN) 5 MG tablet Take 1 tablet (5 mg total) by mouth every 8 (eight) hours as needed for bladder spasms. Qty: 20 tablet, Refills: 0    tamsulosin (FLOMAX) 0.4 MG CAPS capsule Take 1 capsule (0.4 mg total) by mouth daily. Qty: 30 capsule, Refills: 0    iron polysaccharides (NIFEREX) 150 MG capsule Take 1 capsule (150 mg total) by mouth daily. Qty: 30 capsule, Refills: 0      STOP taking these medications     polyethylene glycol (MIRALAX / GLYCOLAX) packet        No Known Allergies Follow-up Information    Follow up with [name],[name], MD. Schedule an appointment as soon as possible for a visit in 10 days.   Specialty:  Internal Medicine   Contact information:   [address] [place] [id number] [phone]       The results of significant diagnostics from this hospitalization (including imaging, microbiology, ancillary and laboratory) are listed below for reference.    Significant Diagnostic Studies: Ct Abdomen Pelvis Wo Contrast  04/11/2015   CLINICAL  DATA:  Right lower quadrant pain for 3 days with elevated white blood count  EXAM: CT ABDOMEN AND PELVIS WITHOUT CONTRAST  TECHNIQUE: Multidetector CT imaging of the abdomen and pelvis was performed following the standard protocol without IV contrast.  COMPARISON:  09/18/2013  FINDINGS: Tiny bilateral pleural effusions. Heavy coronary artery calcification.  Numerous punctate calcifications within the liver suggesting prior granulomatous disease. Spleen upper normal with a span of 12 cm. Gallbladder mildly distended at 9.7 cm with no stone identified. Stomach and pancreas appear normal. Adrenal glands appear normal.  Bilateral perinephric inflammation likely chronic with superimposed acute perinephric inflammation on the right. Severe right hydronephrosis. Renal pelvis on the right is 2.5 cm. There are 2 stones within the renal pelvis measuring about 2-3 mm each. Just beyond the ureteropelvic junction there is  a 10 mm stone beyond which the ureter shows a more normal size. No hydronephrosis on the left.  Mild diffuse bladder wall thickening. Evidence of prostatectomy defect. Small volume ascites abdomen pelvis. No abnormally dilated loops of bowel. Air-fluid levels throughout the colon.  Heavy calcification of the abdominal aorta. 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  No acute musculoskeletal findings.  IMPRESSION: Right-sided obstructive nephropathy due to 1 cm stone just beyond ureteropelvic junction fight distance of about 3.5 cm. Numerous other findings as described above.   Electronically Signed   By: Skipper Cliche M.D.   On: 04/11/2015 12:45   Dg Chest 2 View  04/10/2015   CLINICAL DATA:  Subsequent encounter for diabetes and hypertension.  EXAM: CHEST  2 VIEW  COMPARISON:  03/01/2015.  FINDINGS: Stable asymmetric elevation of the right hemidiaphragm. Pulmonary edema pattern seen previously has resolved in the interval. No edema or focal airspace consolidation on the  current exam. No pleural effusion. The cardiopericardial silhouette is within normal limits for size. Imaged bony structures of the thorax are intact.  IMPRESSION: No active cardiopulmonary disease.   Electronically Signed   By: Misty Stanley M.D.   On: 04/10/2015 18:54   Dg Abd Portable 1v  04/17/2015   CLINICAL DATA:  Abdominal distention  EXAM: PORTABLE ABDOMEN - 1 VIEW  COMPARISON:  04/10/2005  FINDINGS: Scattered large and small bowel gas is noted. A right ureteral stent is noted with proximal right ureteral stone stable from the prior CT examination. No acute bony abnormality is noted.  IMPRESSION: Proximal right ureteral stone as well as right ureteral stent in place.   Electronically Signed   By: Inez Catalina M.D.   On: 04/17/2015 21:49    Microbiology: Recent Results (from the past 240 hour(s))  Urine culture     Status: None   Collection Time: 04/10/15  6:06 PM  Result Value Ref Range Status   Specimen Description URINE, CATHETERIZED  Final   Special Requests NONE  Final   Colony Count   Final    75,000 COLONIES/ML Performed at [facility] Performed at [facility]   Final   Report Status 04/12/2015 FINAL  Final  MRSA PCR Screening     Status: Abnormal   Collection Time: 04/10/15  8:45 PM  Result Value Ref Range Status   MRSA by PCR POSITIVE (A) NEGATIVE Final    Comment:        The GeneXpert MRSA Assay (FDA approved for NASAL specimens only), is one component of a comprehensive MRSA colonization surveillance program. It is not intended to diagnose MRSA infection nor to guide or monitor treatment for MRSA infections. RESULT CALLED TO, READ BACK BY AND VERIFIED WITH: CALLED TO RN REBECCA QIU [id number] @0026  THANEY   Culture, blood (x 2)     Status: None   Collection Time: 04/10/15  9:33 PM  Result Value Ref Range Status   Specimen Description BLOOD RIGHT ANTECUBITAL  Final   Special Requests BOTTLES DRAWN AEROBIC ONLY 4CC  Final    Culture   Final    NO GROWTH 5 DAYS Performed at [facility]    Report Status 04/17/2015 FINAL  Final  Culture, blood (x 2)     Status: None   Collection Time: 04/10/15  9:49 PM  Result Value Ref Range Status   Specimen Description BLOOD RIGHT HAND  Final   Special Requests BOTTLES DRAWN AEROBIC ONLY 5CC  Final   Culture   Final    NO GROWTH 5 DAYS Performed at [facility]    Report Status 04/17/2015 FINAL  Final  Anaerobic culture     Status: None   Collection Time: 04/12/15  1:57 PM  Result Value Ref Range Status   Specimen Description   Final    URINE, CATHETERIZED RIGHT RENAL PELVIS Performed at [place] Requests   Final    PATIENT ON FOLLOWING ROCEPHIN Performed at [facility]    Gram Stain   Final    ABUNDANT WBC PRESENT,BOTH PMN AND MONONUCLEAR NO SQUAMOUS EPITHELIAL CELLS SEEN NO ORGANISMS SEEN FEW YEAST Performed at [facility]    Culture   Final    NO ANAEROBES ISOLATED Performed at [facility]    Report Status 04/17/2015 FINAL  Final  Urine culture     Status: None   Collection Time: 04/12/15  1:57 PM  Result Value Ref Range Status   Specimen Description   Final    URINE, CATHETERIZED RIGHT RENAL PELVIS Performed at [place] Requests   Final    PATIENT ON FOLLOWING ROCEPHIN Performed at [facility]   Final    >=100,000 COLONIES/ML Performed at [place] Performed at [facility]   Final   Report Status 04/14/2015 FINAL  Final  Clostridium Difficile by PCR     Status: Abnormal   Collection Time: 04/13/15  5:56 PM  Result Value Ref Range Status   C difficile by pcr POSITIVE (A) NEGATIVE Final    Comment: CRITICAL RESULT CALLED TO, READ BACK BY AND VERIFIED WITH: A.MACROHON,RN 04/13/15 @2010  BY V.WILKINS      Labs: Basic Metabolic Panel:  Recent  Labs Lab 04/14/15 0423 04/15/15 0531 04/16/15 1248 04/17/15 0534 04/18/15 0630  NA 137 138 141 140 142  K 3.6 4.3 4.2 4.1 4.0  CL 104 106 106 105 106  CO2 21 19 25 23 25   GLUCOSE 115* 115* 122* 118* 111*  BUN 53* 51* 48* 45* 44*  CREATININE 3.92* 3.77* 3.74* 3.58* 3.32*  CALCIUM 6.2* 6.8* 7.4* 7.6* 7.5*  PHOS  --   --   --  3.6  --    Liver Function Tests:  Recent Labs Lab 04/17/15 0534  ALBUMIN 2.1*   CBC:  Recent Labs Lab 04/13/15 0440 04/14/15 0423 04/15/15 0531 04/16/15 1248 04/17/15 0534  WBC 7.8 6.3 8.4 7.9 6.9  HGB 8.1* 7.6* 10.5* 9.5* 10.1*  HCT 25.3* 23.9* 33.4* 29.9* 32.7*  MCV 86.1 87.9 87.4 87.9 88.9  PLT 145* 127* 122* 144* 136*   BNP (last 3 results)  Recent Labs  02/26/15 1726  BNP 187.0*   CBG:  Recent Labs Lab 04/18/15 1105 04/18/15 1638 04/18/15 2059 04/19/15 0812 04/19/15 1212  GLUCAP 131* 127* 112* 145* 107*    Signed:  Barton Dubois  [name] 04/19/2015, 1:28 PM

## 2015-04-19 NOTE — Progress Notes (Signed)
Physical Therapy Treatment Patient Details Name: Douglas [name] MRN: [id number] DOB: July 07, 1931 Today's Date: 04/19/2015    History of Present Illness Patient is an 81 y/o male who had right femur fracure with IM nail in January this year.  Since has had difficulty with urinary retention with history of prostate cancer and recent UTI, C-diff colitis.  Now with Acute on chronic renal failure stage IV; obstructive uropathy and urosepsis with pyelonephritis.  For retrugrade pyelogram and double J stent today.    PT Comments    Worked with wife and daughter on assisting patient with mobility.  Wife attempted to transfer patient bed > chair - unable.  Instructed wife (and daughter) to always have assist to transfer patient bed <> wheelchair.  Patient unable to reach standing position, so unable to ambulate.  Will need to use w/c at home for mobility.  Recommend ambulance transport to home.  Will need HHPT to continue to work with patient/wife on transfers and mobility.   Follow Up Recommendations  Home health PT;Supervision/Assistance - 24 hour (Patient/wife declining SNF)     Equipment Recommendations  Other (comment) (Lift and ambulance transport)    Recommendations for Other Services       Precautions / Restrictions Precautions Precautions: Fall Restrictions Weight Bearing Restrictions: No RLE Weight Bearing: Weight bearing as tolerated    Mobility  Bed Mobility Overal bed mobility: Needs Assistance Bed Mobility: Supine to Sit     Supine to sit: Min assist     General bed mobility comments: Had wife assist patient with PT support.  Patient pulling up using wife's hands.  Encouraged patient to push up with hands on bed, and wife to support trunk to prevent loss of balance.  Patient required min assist to move to sitting.  Transfers Overall transfer level: Needs assistance Equipment used: 1 person hand held assist Transfers: Sit to/from [facility] Sit to  Stand: Mod assist Stand pivot transfers: Mod assist;+2 physical assistance       General transfer comment: Instructed wife and husband on safe technique for transfers.  Wife attempted to assist patient to stand x3.  Patient able to clear hips from bed, but unable to stand.  PT assisted wife in helping patient with squat-pivot transfer.  Required assist to help hips clear armrest of chair.  Talked with wife about removing armrest of w/c at home for transfers, and always having someone helping her with transferring patient until patient is able to do with min assist.  PT then worked with patient on moving sit <> stand x3 for strengthening and attempting to reach fully upright stance.  Patient unable to fully extend knees and hips, standing for 15 seconds in flexed/squat position with mod assist.  Patient with significant posterior lean in stance.  Ambulation/Gait             General Gait Details: Unable   Stairs            Wheelchair Mobility    Modified Rankin (Stroke Patients Only)       Balance Overall balance assessment: Needs assistance Sitting-balance support: Single extremity supported;Feet supported Sitting balance-Leahy Scale: Fair Sitting balance - Comments: With feet on floor, patient able to maintain static balance with min guard assist.   Standing balance support: Bilateral upper extremity supported Standing balance-Leahy Scale: Zero Standing balance comment: Patient unable to reach fully upright position, with posterior lean.  Required mod assist to maintain balance.  Cognition Arousal/Alertness: Awake/alert Behavior During Therapy: Flat affect Overall Cognitive Status: Within Functional Limits for tasks assessed                      Exercises      General Comments        Pertinent Vitals/Pain Pain Assessment: No/denies pain    Home Living                      Prior Function            PT Goals  (current goals can now be found in the care plan section) Progress towards PT goals: Progressing toward goals    Frequency  Min 3X/week    PT Plan Current plan remains appropriate    Co-evaluation             End of Session Equipment Utilized During Treatment: Gait belt Activity Tolerance: Patient tolerated treatment well;Patient limited by fatigue Patient left: in chair;with call bell/phone within reach;with family/visitor present     Time: 1350-1415 PT Time Calculation (min) (ACUTE ONLY): 25 min  Charges:  $Therapeutic Activity: 23-37 mins                    G Codes:      Despina Pole 17-May-2015, 2:30 PM Carita Pian. Sanjuana Kava, [place] Pager [phone]

## 2015-04-19 NOTE — Care Management Note (Addendum)
CARE MANAGEMENT NOTE 04/19/2015  Patient:  Douglas [name], Douglas [name]   Account Number:  [id number]  Date Initiated:  04/15/2015  Documentation initiated by:  Amando Chaput  Subjective/Objective Assessment:   CM following for progression and d/c planning.     Action/Plan:   04/15/15 Continuing to follow spoke with PT they are recommending hoyer lift and ambulance ride home. Pt has other DME and was active with [facility].  04/19/2015 Met with pt and family d/c planned. [facility] notified of plan to d/c to home today.  May need ambulance to d/c to home , CSW following.    Anticipated DC Date:  04/19/2015   Anticipated DC Plan:  [place]         Choice offered to / List presented to:          [facility] arranged  HH-1 RN  [place]      [place].   Status of service:  Completed, signed off Medicare Important Message given?  YES (If response is "NO", the following Medicare IM given date fields will be blank) Date Medicare IM given:  04/15/2015 Medicare IM given by:  Laurene Melendrez Date Additional Medicare IM given:  04/19/2015 Additional Medicare IM given by:  [facility]  Discharge Disposition:    Per UR Regulation:    If discussed at Long Length of Stay Meetings, dates discussed:    Comments:

## 2015-04-29 ENCOUNTER — Emergency Department (HOSPITAL_COMMUNITY): Payer: Medicare Other

## 2015-04-29 ENCOUNTER — Inpatient Hospital Stay (HOSPITAL_COMMUNITY)
Admission: EM | Admit: 2015-04-29 | Discharge: 2015-04-29 | DRG: 871 | Disposition: A | Discharge status: Home / Self Care | Payer: Medicare Other | Attending: Internal Medicine | Admitting: Internal Medicine

## 2015-04-29 ENCOUNTER — Encounter (HOSPITAL_COMMUNITY): Payer: Self-pay | Admitting: Emergency Medicine

## 2015-04-29 DIAGNOSIS — E162 Hypoglycemia, unspecified: Secondary | ICD-10-CM | POA: Diagnosis present

## 2015-04-29 DIAGNOSIS — N390 Urinary tract infection, site not specified: Secondary | ICD-10-CM

## 2015-04-29 DIAGNOSIS — E875 Hyperkalemia: Secondary | ICD-10-CM | POA: Diagnosis present

## 2015-04-29 DIAGNOSIS — Z66 Do not resuscitate: Secondary | ICD-10-CM | POA: Diagnosis present

## 2015-04-29 DIAGNOSIS — I10 Essential (primary) hypertension: Secondary | ICD-10-CM | POA: Diagnosis not present

## 2015-04-29 DIAGNOSIS — K3184 Gastroparesis: Secondary | ICD-10-CM | POA: Diagnosis present

## 2015-04-29 DIAGNOSIS — N184 Chronic kidney disease, stage 4 (severe): Secondary | ICD-10-CM | POA: Diagnosis present

## 2015-04-29 DIAGNOSIS — E11649 Type 2 diabetes mellitus with hypoglycemia without coma: Secondary | ICD-10-CM | POA: Diagnosis present

## 2015-04-29 DIAGNOSIS — N170 Acute kidney failure with tubular necrosis: Secondary | ICD-10-CM

## 2015-04-29 DIAGNOSIS — E785 Hyperlipidemia, unspecified: Secondary | ICD-10-CM | POA: Diagnosis present

## 2015-04-29 DIAGNOSIS — N179 Acute kidney failure, unspecified: Secondary | ICD-10-CM | POA: Diagnosis present

## 2015-04-29 DIAGNOSIS — Z803 Family history of malignant neoplasm of breast: Secondary | ICD-10-CM

## 2015-04-29 DIAGNOSIS — I129 Hypertensive chronic kidney disease with stage 1 through stage 4 chronic kidney disease, or unspecified chronic kidney disease: Secondary | ICD-10-CM | POA: Diagnosis present

## 2015-04-29 DIAGNOSIS — G9340 Encephalopathy, unspecified: Secondary | ICD-10-CM | POA: Diagnosis present

## 2015-04-29 DIAGNOSIS — R1110 Vomiting, unspecified: Secondary | ICD-10-CM | POA: Diagnosis not present

## 2015-04-29 DIAGNOSIS — H905 Unspecified sensorineural hearing loss: Secondary | ICD-10-CM | POA: Diagnosis present

## 2015-04-29 DIAGNOSIS — E1143 Type 2 diabetes mellitus with diabetic autonomic (poly)neuropathy: Secondary | ICD-10-CM | POA: Diagnosis present

## 2015-04-29 DIAGNOSIS — Z794 Long term (current) use of insulin: Secondary | ICD-10-CM

## 2015-04-29 DIAGNOSIS — E43 Unspecified severe protein-calorie malnutrition: Secondary | ICD-10-CM | POA: Diagnosis present

## 2015-04-29 DIAGNOSIS — M1990 Unspecified osteoarthritis, unspecified site: Secondary | ICD-10-CM | POA: Diagnosis present

## 2015-04-29 DIAGNOSIS — Z8249 Family history of ischemic heart disease and other diseases of the circulatory system: Secondary | ICD-10-CM | POA: Diagnosis not present

## 2015-04-29 DIAGNOSIS — A419 Sepsis, unspecified organism: Principal | ICD-10-CM | POA: Diagnosis present

## 2015-04-29 DIAGNOSIS — Z515 Encounter for palliative care: Secondary | ICD-10-CM | POA: Diagnosis not present

## 2015-04-29 DIAGNOSIS — Z87891 Personal history of nicotine dependence: Secondary | ICD-10-CM

## 2015-04-29 DIAGNOSIS — Z8546 Personal history of malignant neoplasm of prostate: Secondary | ICD-10-CM | POA: Diagnosis not present

## 2015-04-29 LAB — CBC WITH DIFFERENTIAL/PLATELET
Basophils Absolute: 0 10*3/uL (ref 0.0–0.1)
Basophils Relative: 1 % (ref 0–1)
EOS ABS: 0.1 10*3/uL (ref 0.0–0.7)
EOS PCT: 1 % (ref 0–5)
HCT: 31 % — ABNORMAL LOW (ref 39.0–52.0)
HEMOGLOBIN: 9.5 g/dL — AB (ref 13.0–17.0)
LYMPHS ABS: 0.6 10*3/uL — AB (ref 0.7–4.0)
Lymphocytes Relative: 9 % — ABNORMAL LOW (ref 12–46)
MCH: 27.3 pg (ref 26.0–34.0)
MCHC: 30.6 g/dL (ref 30.0–36.0)
MCV: 89.1 fL (ref 78.0–100.0)
MONO ABS: 0.6 10*3/uL (ref 0.1–1.0)
Monocytes Relative: 9 % (ref 3–12)
Neutro Abs: 5.2 10*3/uL (ref 1.7–7.7)
Neutrophils Relative %: 80 % — ABNORMAL HIGH (ref 43–77)
PLATELETS: 139 10*3/uL — AB (ref 150–400)
RBC: 3.48 MIL/uL — AB (ref 4.22–5.81)
RDW: 15.1 % (ref 11.5–15.5)
WBC: 6.4 10*3/uL (ref 4.0–10.5)

## 2015-04-29 LAB — COMPREHENSIVE METABOLIC PANEL
ALK PHOS: 124 U/L (ref 38–126)
ALT: 23 U/L (ref 17–63)
ANION GAP: 12 (ref 5–15)
AST: 37 U/L (ref 15–41)
Albumin: 2.8 g/dL — ABNORMAL LOW (ref 3.5–5.0)
BUN: 36 mg/dL — ABNORMAL HIGH (ref 6–20)
CO2: 19 mmol/L — AB (ref 22–32)
Calcium: 8.2 mg/dL — ABNORMAL LOW (ref 8.9–10.3)
Chloride: 106 mmol/L (ref 101–111)
Creatinine, Ser: 3.44 mg/dL — ABNORMAL HIGH (ref 0.61–1.24)
GFR calc Af Amer: 18 mL/min — ABNORMAL LOW (ref >60)
GFR calc non Af Amer: 15 mL/min — ABNORMAL LOW (ref >60)
Glucose, Bld: 171 mg/dL — ABNORMAL HIGH (ref 70–99)
Potassium: 7.1 mmol/L (ref 3.5–5.1)
Sodium: 137 mmol/L (ref 135–145)
TOTAL PROTEIN: 7.9 g/dL (ref 6.5–8.1)
Total Bilirubin: 0.4 mg/dL (ref 0.3–1.2)

## 2015-04-29 LAB — URINALYSIS, ROUTINE W REFLEX MICROSCOPIC
GLUCOSE, UA: 100 mg/dL — AB
Nitrite: POSITIVE — AB
Specific Gravity, Urine: 1.02 (ref 1.005–1.030)
UROBILINOGEN UA: 1 mg/dL (ref 0.0–1.0)
pH: 7.5 (ref 5.0–8.0)

## 2015-04-29 LAB — I-STAT CHEM 8, ED
BUN: 32 mg/dL — ABNORMAL HIGH (ref 6–20)
CHLORIDE: 110 mmol/L (ref 101–111)
CREATININE: 3.3 mg/dL — AB (ref 0.61–1.24)
Calcium, Ion: 1.12 mmol/L — ABNORMAL LOW (ref 1.13–1.30)
GLUCOSE: 67 mg/dL — AB (ref 70–99)
HCT: 29 % — ABNORMAL LOW (ref 39.0–52.0)
Hemoglobin: 9.9 g/dL — ABNORMAL LOW (ref 13.0–17.0)
Potassium: 6.2 mmol/L (ref 3.5–5.1)
SODIUM: 142 mmol/L (ref 135–145)
TCO2: 18 mmol/L (ref 0–100)

## 2015-04-29 LAB — URINE MICROSCOPIC-ADD ON

## 2015-04-29 LAB — I-STAT CG4 LACTIC ACID, ED
Lactic Acid, Venous: 4.22 mmol/L (ref 0.5–2.0)
Lactic Acid, Venous: 3.04 mmol/L (ref 0.5–2.0)

## 2015-04-29 MED ORDER — DEXTROSE 5 % IV SOLN
1.0000 g | Freq: Once | INTRAVENOUS | Status: AC
  Administered 2015-04-29: 1 g via INTRAVENOUS
  Filled 2015-04-29: qty 10

## 2015-04-29 MED ORDER — MORPHINE SULFATE 25 MG/ML IV SOLN
1.0000 mg/h | INTRAVENOUS | Status: DC
  Filled 2015-04-29: qty 10

## 2015-04-29 MED ORDER — SODIUM BICARBONATE 8.4 % IV SOLN
25.0000 meq | Freq: Once | INTRAVENOUS | Status: AC
  Administered 2015-04-29: 25 meq via INTRAVENOUS
  Filled 2015-04-29: qty 50

## 2015-04-29 MED ORDER — ACETAMINOPHEN 650 MG RE SUPP
650.0000 mg | Freq: Once | RECTAL | Status: AC
  Administered 2015-04-29: 650 mg via RECTAL
  Filled 2015-04-29: qty 1

## 2015-04-29 MED ORDER — ACETAMINOPHEN 325 MG PO TABS: 650.0000 mg | ORAL_TABLET | Freq: Once | ORAL | Status: DC

## 2015-04-29 MED ORDER — ONDANSETRON HCL 4 MG/2ML IJ SOLN
4.0000 mg | Freq: Once | INTRAMUSCULAR | Status: AC
  Administered 2015-04-29: 4 mg via INTRAVENOUS
  Filled 2015-04-29: qty 2

## 2015-04-29 MED ORDER — DEXTROSE 50 % IV SOLN
INTRAVENOUS | Status: AC
  Filled 2015-04-29: qty 50

## 2015-04-29 MED ORDER — DEXTROSE 50 % IV SOLN: 25.0000 g | Freq: Once | INTRAVENOUS | Status: DC

## 2015-04-29 MED ORDER — SODIUM POLYSTYRENE SULFONATE 15 GM/60ML PO SUSP
30.0000 g | Freq: Once | ORAL | Status: AC
  Administered 2015-04-29: 30 g via ORAL
  Filled 2015-04-29: qty 120

## 2015-04-29 MED ORDER — SODIUM CHLORIDE 0.9 % IV SOLN
1.0000 g | Freq: Once | INTRAVENOUS | Status: AC
  Administered 2015-04-29: 1 g via INTRAVENOUS
  Filled 2015-04-29: qty 10

## 2015-04-29 MED ORDER — SODIUM CHLORIDE 0.9 % IV BOLUS (SEPSIS)
1000.0000 mL | Freq: Once | INTRAVENOUS | Status: AC
  Administered 2015-04-29: 1000 mL via INTRAVENOUS

## 2015-04-29 MED ORDER — INSULIN ASPART 100 UNIT/ML ~~LOC~~ SOLN
3.0000 [IU] | Freq: Once | SUBCUTANEOUS | Status: AC
  Administered 2015-04-29: 3 [IU] via INTRAVENOUS
  Filled 2015-04-29: qty 1

## 2015-04-29 MED ORDER — DEXTROSE 50 % IV SOLN
50.0000 mL | Freq: Once | INTRAVENOUS | Status: DC
  Administered 2015-04-29: 50 mL via INTRAVENOUS

## 2015-04-29 MED ORDER — SODIUM CHLORIDE 0.9 % IV BOLUS (SEPSIS): 1000.0000 mL | Freq: Once | INTRAVENOUS | Status: AC

## 2015-04-29 NOTE — Discharge Instructions (Signed)
Home to hospice care

## 2015-04-29 NOTE — ED Notes (Signed)
Hospitalist informed that pt don't able to take kayexalate as previously ordered.

## 2015-04-29 NOTE — H&P (Addendum)
[name] History and Physical  Douglas [name] PJK:[id number] DOB: [birth year] DOA: 04/29/2015  Referring physician: Dr. Lacinda Axon PCP: [name]., MD   Chief Complaint: Confusion  HPI: Douglas [name] is a 81 y.o. male of essential hypertension, multiple admissions for urinary tract infection of her GI bleed, C. difficile colitis, status post prostate cancer radiation, recently hospitalized on 3/18, 3/23 and 4/13 for sepsis and UTI that comes in for confusion and hypoglycemia that started today of admission. The patient was unresponsive as per family so they decided to bring him into the ED.  In the ED: He was found to be febrile 104, new acute renal failure, hypotensive which he received a liter of normal saline, tachycardic and hyperkalemic, his UA showed possible urinary tract infection. So we were consulted for further evaluation.  Review of Systems:  Constitutional:  No weight loss, night sweats, chills, fatigue.  HEENT:  No headaches, Difficulty swallowing,Tooth/dental problems,Sore throat,  No sneezing, itching, ear ache, nasal congestion, post nasal drip,  Cardio-vascular:  No chest pain, Orthopnea, PND, swelling in lower extremities, anasarca, dizziness, palpitations  GI:  No heartburn, indigestion, abdominal pain, nausea, vomiting, diarrhea, change in bowel habits, loss of appetite  Resp:  No shortness of breath with exertion or at rest. No excess mucus, no productive cough, No non-productive cough, No coughing up of blood.No change in color of mucus.No wheezing.No chest wall deformity  Skin:  no rash or lesions.  GU:  no dysuria, change in color of urine, no urgency or frequency. No flank pain.  Musculoskeletal:  No joint pain or swelling. No decreased range of motion. No back pain.  Psych:  No change in mood or affect. No depression or anxiety. No memory loss.   Past Medical History  Diagnosis Date  . Hypertension   . Hyperlipidemia   . Hyperkalemia   .  UTI (lower urinary tract infection)   . Sensorineural hearing loss, unspecified   . Epiglottitis   . Chronic ankle pain   . Neuromuscular disorder     neurophaty lower legs  . Upper GI bleeding     while at [facility] 12/2014  . Confusion   . Prostate cancer 2009    radiation  . Type II diabetes mellitus   . Gastroparesis due to DM: DELAYED GASTRIC EMPTYING STUDY 02/08/15 02/08/2015  . Pneumonia 12/2014; 02/2015  . History of blood transfusion X 3    "related to low HgB"  . Anemia   . GERD (gastroesophageal reflux disease)   . Arthritis     "knees" (04/10/2015)  . History of gout   . Depression     "since he broke his leg in 12/2014" (04/10/2015)  . Calculus of kidney   . Chronic kidney disease (CKD), stage IV (severe)     "he's on the verge of dialysis" (04/10/2015)   Past Surgical History  Procedure Laterality Date  . Eye surgery  sept. 2015    cataract surgery  . Femur im nail Right 01/18/2015    Procedure: INTRAMEDULLARY (IM) RETROGRADE FEMORAL NAILING;  Surgeon: Renette Butters, MD;  Location: [place];  Service: Orthopedics;  Laterality: Right;  . Fracture surgery    . Appendectomy    . Inguinal hernia repair  1970's?  . Cataract extraction w/ intraocular lens  implant, bilateral Bilateral   . Cystoscopy w/ ureteral stent placement Right 04/12/2015    Procedure: RIGHT RETROGRADE PYELOGRAM STENT PLACEMENT;  Surgeon: Kathie Rhodes, MD;  Location: [facility];  Service: Urology;  Laterality: Right;   Social History:  reports that he has quit smoking. His smoking use included Cigarettes. He quit after 1 year of use. He has quit using smokeless tobacco. His smokeless tobacco use included Chew. He reports that he does not drink alcohol or use illicit drugs.  No Known Allergies  Family History  Problem Relation Age of Onset  . Breast cancer Mother   . Hypertension Father   . Heart attack Father   . Heart attack Father   . Thyroid disease Sister     Prior to Admission medications   Medication  Sig Start Date End Date Taking? Authorizing Provider  acetaminophen (TYLENOL) 500 MG tablet Take 500 mg by mouth every 6 (six) hours as needed for mild pain or moderate pain.   Yes Historical Provider, MD  Amino Acids-Protein Hydrolys (FEEDING SUPPLEMENT, PRO-STAT SUGAR FREE 64,) LIQD Take 30 mLs by mouth 3 (three) times daily between meals. 04/19/15  Yes Barton Dubois, MD  atorvastatin (LIPITOR) 40 MG tablet Take 40 mg by mouth daily.   Yes Historical Provider, MD  collagenase (SANTYL) ointment Apply topically daily. Apply on daily basis to heel/foot wounds. 04/19/15  Yes Barton Dubois, MD  gabapentin (NEURONTIN) 300 MG capsule Take 1 capsule (300 mg total) by mouth at bedtime. 03/09/15  Yes Kathie Dike, MD  insulin aspart (NOVOLOG) 100 UNIT/ML injection insulin aspart (novoLOG) injection 0-15 Units 0-15 Units, Subcutaneous, 3 times daily with meals CBG 70 - 120: 0 units CBG 121 - 150: 2 units CBG 151 - 200: 3 units CBG 201 - 250: 5 units CBG 251 - 300: 8 units CBG 301 - 350: 11 units CBG 351 - 400: 15 units CBG > 400: call MD Patient taking differently: Inject 0-15 Units into the skin 3 (three) times daily with meals. insulin aspart (novoLOG) injection 0-15 Units 0-15 Units, Subcutaneous, 3 times daily with meals CBG 70 - 120: 0 units CBG 121 - 150: 2 units CBG 151 - 200: 3 units CBG 201 - 250: 5 units CBG 251 - 300: 8 units CBG 301 - 350: 11 units CBG 351 - 400: 15 units CBG > 400: call MD 01/21/15  Yes Shanker Kristeen Mans, MD  insulin detemir (LEVEMIR) 100 UNIT/ML injection Inject 0.1 mLs (10 Units total) into the skin at bedtime. 02/09/15  Yes Eugenie Filler, MD  ketorolac (ACULAR) 0.5 % ophthalmic solution Place 1 drop into both eyes 3 (three) times daily.   Yes Historical Provider, MD  metoCLOPramide (REGLAN) 5 MG tablet Take 1 tablet (5 mg total) by mouth 3 (three) times daily before meals. 04/19/15  Yes Barton Dubois, MD  metoprolol tartrate (LOPRESSOR) 25 MG tablet Take 0.5  tablets (12.5 mg total) by mouth 2 (two) times daily. Patient taking differently: Take 12.5 mg by mouth daily as needed (blood pressure).  03/20/15  Yes Erline Hau, MD  Nutritional Supplements (FEEDING SUPPLEMENT, NEPRO CARB STEADY,) LIQD Take 237 mLs by mouth 2 (two) times daily between meals. 04/19/15  Yes Barton Dubois, MD  oxybutynin (DITROPAN) 5 MG tablet Take 1 tablet (5 mg total) by mouth every 8 (eight) hours as needed for bladder spasms. 02/09/15  Yes Eugenie Filler, MD  saccharomyces boulardii (FLORASTOR) 250 MG capsule Take 1 capsule (250 mg total) by mouth 2 (two) times daily. 04/19/15  Yes Barton Dubois, MD  simethicone [facility]) 40 QM/5.7QI drops Take 0.6 mLs (40 mg total) by mouth 4 (four) times daily as needed for flatulence. 04/19/15  Yes  Barton Dubois, MD  tamsulosin (FLOMAX) 0.4 MG CAPS capsule Take 1 capsule (0.4 mg total) by mouth daily. 02/09/15  Yes Eugenie Filler, MD  vancomycin (VANCOCIN) 50 mg/mL oral solution Take 2.5 mLs (125 mg total) by mouth 4 (four) times daily. 04/19/15 05/03/15 Yes Barton Dubois, MD  iron polysaccharides (NIFEREX) 150 MG capsule Take 1 capsule (150 mg total) by mouth daily. Patient not taking: Reported on 03/15/2015 03/09/15   Kathie Dike, MD  levothyroxine (SYNTHROID, LEVOTHROID) 25 MCG tablet Take 1 tablet (25 mcg total) by mouth daily before breakfast. Patient not taking: Reported on 04/29/2015 04/19/15   Barton Dubois, MD   Physical Exam: Filed Vitals:   04/29/15 0720 04/29/15 0930 04/29/15 1004 04/29/15 1124  BP: 217/171 229/200  194/157  Pulse: 145 134  127  Temp: 104.7 F (40.4 C)   102.9 F (39.4 C)  TempSrc: Rectal   Core (Comment)  Resp: 24 36  23  Height: 5\' 7"  (1.702 m)     Weight: 54.432 kg (120 lb)     SpO2: 93% 97% 93% 98%    Wt Readings from Last 3 Encounters:  04/29/15 54.432 kg (120 lb)  04/18/15 56.291 kg (124 lb 1.6 oz)  03/16/15 58.514 kg (129 lb)    General:  Unresponsive Eyes: No pallor ENT: Dry  mucous membranes Neck: Trachea is midline no JVD Cardiovascular: RRR, no m/r/g. No LE edema. Telemetry: Sinus tachycardia with multiple PVCs. Respiratory: CTA bilaterally, no w/r/r. Normal respiratory effort. Abdomen: soft, ntnd Skin: no rash or induration seen on limited exam, multiple bruises Musculoskeletal: grossly normal tone BUE/BLE Psychiatric: grossly normal mood and affect, speech fluent and appropriate Neurologic: Only responsive to pain moving all 4 extremities to noci stimuli.          Labs on Admission:  Basic Metabolic Panel:  Recent Labs Lab 04/29/15 0738 04/29/15 1118  NA 137 142  K 7.1* 6.2*  CL 106 110  CO2 19*  --   GLUCOSE 171* 67*  BUN 36* 32*  CREATININE 3.44* 3.30*  CALCIUM 8.2*  --    Liver Function Tests:  Recent Labs Lab 04/29/15 0738  AST 37  ALT 23  ALKPHOS 124  BILITOT 0.4  PROT 7.9  ALBUMIN 2.8*   No results for input(s): LIPASE, AMYLASE in the last 168 hours. No results for input(s): AMMONIA in the last 168 hours. CBC:  Recent Labs Lab 04/29/15 0738 04/29/15 1118  WBC 6.4  --   NEUTROABS 5.2  --   HGB 9.5* 9.9*  HCT 31.0* 29.0*  MCV 89.1  --   PLT 139*  --    Cardiac Enzymes: No results for input(s): CKTOTAL, CKMB, CKMBINDEX, TROPONINI in the last 168 hours.  BNP (last 3 results)  Recent Labs  02/26/15 1726  BNP 187.0*    ProBNP (last 3 results) No results for input(s): PROBNP in the last 8760 hours.  CBG: No results for input(s): GLUCAP in the last 168 hours.  Radiological Exams on Admission: Dg Chest Portable 1 View  04/29/2015   CLINICAL DATA:  Vomiting.  Fever.  EXAM: PORTABLE CHEST - 1 VIEW  COMPARISON:  Two-view chest x-ray 04/10/2015.  FINDINGS: The heart size is normal. Elevation of the right hemidiaphragm has increased. Mild pulmonary vascular congestion is noted. There are no effusions. No focal airspace opacification is evident. The visualized soft tissues and bony thorax are unremarkable.  IMPRESSION:  1. Interval progression of elevated right hemidiaphragm. No focal etiology is evident. 2. Mild  pulmonary vascular congestion without frank edema. 3. Atherosclerosis.   Electronically Signed   By: San Morelle M.D.   On: 04/29/2015 07:50    EKG: Independently reviewed. A. fib with RVR without axis deviation and peak T waves.  Assessment/Plan Active Problems:   Sepsis secondary to UTI   Essential hypertension   Hyperkalemia   Acute renal failure   Protein-calorie malnutrition, severe   Acute encephalopathy  He received a liter of normal saline in the emergency room, diagnosis was given aggressive IV fluid hydration, he was given bicarbonate insulin and glucose. He's not able to swallow the Kayexalate so this cannot be provided. After long discussion with the family due to to his decline in his health over the last 6 months with multiple admissions. And due to the fact that it will seem to suffer they have decided to move towards comfort care. They would like him to be treated in the hospital with comfort care measures. We have discussed that we will stop all treatment and start him only on medication that'll make him comfortable and they have agreed. They  also requested a chaplain service. We'll admit him to med surg for comfort. I have spent over 40 minutes discussing with family goals of care. After much delivery the family decided to take the patient home.  Code Status: DNR/DNI DVT Prophylaxis:none Family Communication: daughter and mother Disposition Plan: inpatient  Time spent: 71 min  Charlynne Cousins [name] Pager [phone]

## 2015-04-29 NOTE — ED Notes (Signed)
CRITICAL VALUE ALERT  Critical value received: K+ 7.1  Date of notification:  04/29/15  Time of notification:  0815  Critical value read back:Yes.    Nurse who received alert:  Allegra Lai, RN  MD notified (1st page):  Dr Lacinda Axon  Time of first page:  [phone]

## 2015-04-29 NOTE — ED Notes (Signed)
Hospitalist at bedside 

## 2015-04-29 NOTE — ED Notes (Signed)
Spoke with Janett Billow at ext [phone].  Janett Billow to call back once plans are made.

## 2015-04-29 NOTE — ED Notes (Addendum)
Pt brought in EMS. EMS reports pt began vomiting last night and fever onset this am. cbg en route 164. Pt alert and lethargic at time of arrival. Pt vomited x1 upon arrival. 4MG  of zofran admin en route.

## 2015-04-29 NOTE — ED Notes (Signed)
Stage 1 to sacral area,  redness and rash.

## 2015-04-29 NOTE — ED Provider Notes (Addendum)
CSN: [id number]     Arrival date & time 04/29/15  0718 History  This chart was scribed for Nat Christen, MD by Eustaquio Maize, ED Scribe. This patient was seen in room APA03/APA03 and the patient's care was started at 8:06 AM.    Chief Complaint  Patient presents with  . Emesis   LEVEL 5 CAVEAT for serious illness  The history is provided by the spouse and a relative. No language interpreter was used.     HPI Comments: Douglas [name] is a 81 y.o. male brought in by ambulance, with hx DM, HTN, HLD who presents to the Emergency Department complaining of vomiting that began this morning. Family mentions that last night pt was hypotensive and hypoglycemic with glucose level of 21. Family called home [place] which prompted family to call paramedics. [facility] were able to increase pt's glucose level with sugary foods and soda last night. Pt slept fine but upon waking up this morning, he began trembling and vomiting Family reports that pt is also altered, which is not his baseline. Pt has not vomited since being in the ED. Family mentions that this is pt's 6th visit to the ED since breaking his right leg in January of this year.   Past Medical History  Diagnosis Date  . Hypertension   . Hyperlipidemia   . Hyperkalemia   . UTI (lower urinary tract infection)   . Sensorineural hearing loss, unspecified   . Epiglottitis   . Chronic ankle pain   . Neuromuscular disorder     neurophaty lower legs  . Upper GI bleeding     while at [facility] 12/2014  . Confusion   . Prostate cancer 2009    radiation  . Type II diabetes mellitus   . Gastroparesis due to DM: DELAYED GASTRIC EMPTYING STUDY 02/08/15 02/08/2015  . Pneumonia 12/2014; 02/2015  . History of blood transfusion X 3    "related to low HgB"  . Anemia   . GERD (gastroesophageal reflux disease)   . Arthritis     "knees" (04/10/2015)  . History of gout   . Depression     "since he broke his leg in 12/2014" (04/10/2015)  . Calculus of kidney   .  Chronic kidney disease (CKD), stage IV (severe)     "he's on the verge of dialysis" (04/10/2015)   Past Surgical History  Procedure Laterality Date  . Eye surgery  sept. 2015    cataract surgery  . Femur im nail Right 01/18/2015    Procedure: INTRAMEDULLARY (IM) RETROGRADE FEMORAL NAILING;  Surgeon: Renette Butters, MD;  Location: [place];  Service: Orthopedics;  Laterality: Right;  . Fracture surgery    . Appendectomy    . Inguinal hernia repair  1970's?  . Cataract extraction w/ intraocular lens  implant, bilateral Bilateral   . Cystoscopy w/ ureteral stent placement Right 04/12/2015    Procedure: RIGHT RETROGRADE PYELOGRAM STENT PLACEMENT;  Surgeon: Kathie Rhodes, MD;  Location: [facility];  Service: Urology;  Laterality: Right;   Family History  Problem Relation Age of Onset  . Breast cancer Mother   . Hypertension Father   . Heart attack Father   . Heart attack Father   . Thyroid disease Sister    History  Substance Use Topics  . Smoking status: Former Smoker -- 1 years    Types: Cigarettes  . Smokeless tobacco: Former Systems developer    Types: Chew     Comment: "smoked some when he was 16-17;  chewed as a young teenager"  . Alcohol Use: No    Review of Systems  Unable to perform ROS: Acuity of condition      Allergies  Review of patient's allergies indicates no known allergies.  Home Medications   Prior to Admission medications   Medication Sig Start Date End Date Taking? Authorizing Provider  acetaminophen (TYLENOL) 500 MG tablet Take 500 mg by mouth every 6 (six) hours as needed for mild pain or moderate pain.   Yes Historical Provider, MD  Amino Acids-Protein Hydrolys (FEEDING SUPPLEMENT, PRO-STAT SUGAR FREE 64,) LIQD Take 30 mLs by mouth 3 (three) times daily between meals. 04/19/15  Yes Barton Dubois, MD  atorvastatin (LIPITOR) 40 MG tablet Take 40 mg by mouth daily.   Yes Historical Provider, MD  collagenase (SANTYL) ointment Apply topically daily. Apply on daily basis to  heel/foot wounds. 04/19/15  Yes Barton Dubois, MD  gabapentin (NEURONTIN) 300 MG capsule Take 1 capsule (300 mg total) by mouth at bedtime. 03/09/15  Yes Kathie Dike, MD  insulin aspart (NOVOLOG) 100 UNIT/ML injection insulin aspart (novoLOG) injection 0-15 Units 0-15 Units, Subcutaneous, 3 times daily with meals CBG 70 - 120: 0 units CBG 121 - 150: 2 units CBG 151 - 200: 3 units CBG 201 - 250: 5 units CBG 251 - 300: 8 units CBG 301 - 350: 11 units CBG 351 - 400: 15 units CBG > 400: call MD Patient taking differently: Inject 0-15 Units into the skin 3 (three) times daily with meals. insulin aspart (novoLOG) injection 0-15 Units 0-15 Units, Subcutaneous, 3 times daily with meals CBG 70 - 120: 0 units CBG 121 - 150: 2 units CBG 151 - 200: 3 units CBG 201 - 250: 5 units CBG 251 - 300: 8 units CBG 301 - 350: 11 units CBG 351 - 400: 15 units CBG > 400: call MD 01/21/15  Yes Shanker Kristeen Mans, MD  insulin detemir (LEVEMIR) 100 UNIT/ML injection Inject 0.1 mLs (10 Units total) into the skin at bedtime. 02/09/15  Yes Eugenie Filler, MD  ketorolac (ACULAR) 0.5 % ophthalmic solution Place 1 drop into both eyes 3 (three) times daily.   Yes Historical Provider, MD  metoCLOPramide (REGLAN) 5 MG tablet Take 1 tablet (5 mg total) by mouth 3 (three) times daily before meals. 04/19/15  Yes Barton Dubois, MD  metoprolol tartrate (LOPRESSOR) 25 MG tablet Take 0.5 tablets (12.5 mg total) by mouth 2 (two) times daily. Patient taking differently: Take 12.5 mg by mouth daily as needed (blood pressure).  03/20/15  Yes Erline Hau, MD  Nutritional Supplements (FEEDING SUPPLEMENT, NEPRO CARB STEADY,) LIQD Take 237 mLs by mouth 2 (two) times daily between meals. 04/19/15  Yes Barton Dubois, MD  oxybutynin (DITROPAN) 5 MG tablet Take 1 tablet (5 mg total) by mouth every 8 (eight) hours as needed for bladder spasms. 02/09/15  Yes Eugenie Filler, MD  saccharomyces boulardii (FLORASTOR) 250 MG capsule  Take 1 capsule (250 mg total) by mouth 2 (two) times daily. 04/19/15  Yes Barton Dubois, MD  simethicone [facility]) 40 NT/6.1WE drops Take 0.6 mLs (40 mg total) by mouth 4 (four) times daily as needed for flatulence. 04/19/15  Yes Barton Dubois, MD  tamsulosin (FLOMAX) 0.4 MG CAPS capsule Take 1 capsule (0.4 mg total) by mouth daily. 02/09/15  Yes Eugenie Filler, MD  vancomycin (VANCOCIN) 50 mg/mL oral solution Take 2.5 mLs (125 mg total) by mouth 4 (four) times daily. 04/19/15 05/03/15 Yes Barton Dubois,  MD  iron polysaccharides (NIFEREX) 150 MG capsule Take 1 capsule (150 mg total) by mouth daily. Patient not taking: Reported on 03/15/2015 03/09/15   Kathie Dike, MD  levothyroxine (SYNTHROID, LEVOTHROID) 25 MCG tablet Take 1 tablet (25 mcg total) by mouth daily before breakfast. Patient not taking: Reported on 04/29/2015 04/19/15   Barton Dubois, MD   Triage Vitals: BP 217/171 mmHg  Pulse 145  Temp(Src) 104.7 F (40.4 C) (Rectal)  Resp 24  Ht 5\' 7"  (1.702 m)  Wt 120 lb (54.432 kg)  BMI 18.79 kg/m2  SpO2 93%   Physical Exam  Constitutional: He is oriented to person, place, and time.  Cachetic. Mumbling, not answer questions appropriately.   HENT:  Head: Normocephalic and atraumatic.  Eyes: Conjunctivae and EOM are normal. Pupils are equal, round, and reactive to light.  Neck: Normal range of motion. Neck supple.  Cardiovascular: An irregular rhythm present. Tachycardia present.   Pulmonary/Chest: Effort normal and breath sounds normal.  Abdominal: Soft. Bowel sounds are normal.  Musculoskeletal: Normal range of motion.  Neurological: He is alert and oriented to person, place, and time.  Skin: Skin is warm and dry.  Right heel ulcer.   Psychiatric: He has a normal mood and affect. His behavior is normal.  Nursing note and vitals reviewed.   ED Course  Procedures (including critical care time)  DIAGNOSTIC STUDIES: Oxygen Saturation is 93% on RA, adequate by my interpretation.     COORDINATION OF CARE: 8:17 AM-Discussed treatment plan which includes Lactic acid, CMP, CBC, Blood culture, UA, Urine culture with family at bedside and family agreed to plan. Spoke to family about code status of patient; they do not want aggressive resuscitation at this time.   Labs Review Labs Reviewed  COMPREHENSIVE METABOLIC PANEL - Abnormal; Notable for the following:    Potassium 7.1 (*)    CO2 19 (*)    Glucose, Bld 171 (*)    BUN 36 (*)    Creatinine, Ser 3.44 (*)    Calcium 8.2 (*)    Albumin 2.8 (*)    GFR calc non Af Amer 15 (*)    GFR calc Af Amer 18 (*)    All other components within normal limits  CBC WITH DIFFERENTIAL/PLATELET - Abnormal; Notable for the following:    RBC 3.48 (*)    Hemoglobin 9.5 (*)    HCT 31.0 (*)    Platelets 139 (*)    Neutrophils Relative % 80 (*)    Lymphocytes Relative 9 (*)    Lymphs Abs 0.6 (*)    All other components within normal limits  URINALYSIS, ROUTINE W REFLEX MICROSCOPIC - Abnormal; Notable for the following:    Color, Urine RED (*)    APPearance CLOUDY (*)    Glucose, UA 100 (*)    Hgb urine dipstick LARGE (*)    Bilirubin Urine SMALL (*)    Ketones, ur TRACE (*)    Protein, ur >300 (*)    Nitrite POSITIVE (*)    Leukocytes, UA MODERATE (*)    All other components within normal limits  URINE MICROSCOPIC-ADD ON - Abnormal; Notable for the following:    Bacteria, UA MANY (*)    All other components within normal limits  I-STAT CG4 LACTIC ACID, ED - Abnormal; Notable for the following:    Lactic Acid, Venous 4.22 (*)    All other components within normal limits  CULTURE, BLOOD (ROUTINE X 2)  CULTURE, BLOOD (ROUTINE X 2)  URINE CULTURE  I-STAT CG4 LACTIC ACID, ED    Imaging Review Dg Chest Portable 1 View  04/29/2015   CLINICAL DATA:  Vomiting.  Fever.  EXAM: PORTABLE CHEST - 1 VIEW  COMPARISON:  Two-view chest x-ray 04/10/2015.  FINDINGS: The heart size is normal. Elevation of the right hemidiaphragm has increased.  Mild pulmonary vascular congestion is noted. There are no effusions. No focal airspace opacification is evident. The visualized soft tissues and bony thorax are unremarkable.  IMPRESSION: 1. Interval progression of elevated right hemidiaphragm. No focal etiology is evident. 2. Mild pulmonary vascular congestion without frank edema. 3. Atherosclerosis.   Electronically Signed   By: San Morelle M.D.   On: 04/29/2015 07:50     EKG Interpretation   Date/Time:  Monday Apr 29 2015 07:21:29 EDT Ventricular Rate:  144 PR Interval:    QRS Duration: 113 QT Interval:  368 QTC Calculation: 570 R Axis:   -50 Text Interpretation:  Atrial fibrillation with rapid V-rate LAD, consider  left anterior fascicular block Nonspecific T abnrm, anterolateral leads  Confirmed by Zosia Lucchese  MD, Scout Guyett ([id number]) on 04/29/2015 7:53:15 AM     CRITICAL CARE Performed by: Nat Christen Total critical care time: 40 Critical care time was exclusive of separately billable procedures and treating other patients. Critical care was necessary to treat or prevent imminent or life-threatening deterioration. Critical care was time spent personally by me on the following activities: development of treatment plan with patient and/or surrogate as well as nursing, discussions with consultants, evaluation of patient's response to treatment, examination of patient, obtaining history from patient or surrogate, ordering and performing treatments and interventions, ordering and review of laboratory studies, ordering and review of radiographic studies, pulse oximetry and re-evaluation of patient's condition. MDM   Final diagnoses:  UTI (lower urinary tract infection)      Complex patient with DO NOT RESUSCITATE/DO NOT INTUBATE orders per family.   He appears to be septic. IV fluids, blood cultures, IV Rocephin. Urinalysis shows gross evidence of infection. Chest x-ray no pneumonia noted.  12:30    Decision made by family for hospice  care. Patient will not be admitted to the hospital. Arrangements made for hospice care.   Nat Christen, MD 04/29/15 [place], MD 04/29/15 1346

## 2015-04-29 NOTE — ED Notes (Signed)
Verbal order given to send home with foley in place.

## 2015-04-29 NOTE — Care Management Note (Signed)
Case Management Note  Patient Details  Name: TYREIK DELAHOUSSAYE MRN: [id number] Date of Birth: 17-May-1931  Subjective/Objective:                    Action/Plan:  [facility], per family request, notified of referral and faxed pt info. Pt will be discharged home from ED via EMS. ED RN to notify Hospice RN when Pt leaves ED. Pt Hospice to made visit for admission once pt gets home. No further CM needs.  Expected Discharge Date:  04/29/15               Expected Discharge Plan:  Home w Hospice Care  In-House Referral:  NA  Discharge planning Services  CM Consult  Post Acute Care Choice:  Hospice Choice offered to:  Spouse  DME Arranged:    DME Agency:     HH Arranged:    [place] Agency:     Status of Service:  Completed, signed off  Medicare Important Message Given:    Date Medicare IM Given:    Medicare IM give by:    Date Additional Medicare IM Given:    Additional Medicare Important Message give by:     If discussed at [place] of Stay Meetings, dates discussed:    Additional Comments:  Sherald Barge, RN 04/29/2015, 3:20 PM

## 2015-04-29 NOTE — ED Notes (Signed)
Family is requesting to take pt home on hospice.  Dr Aileen Fass informed and was instructed to let Dr Lacinda Axon know, because Dr Lacinda Axon will need to make those arrangements.  Dr Lacinda Axon aware.

## 2015-04-29 NOTE — ED Notes (Signed)
[facility] at [phone] that is d/cd home.  Informed that foley is in place.

## 2015-04-30 LAB — URINE CULTURE
CULTURE: NO GROWTH
Colony Count: NO GROWTH

## 2015-05-04 LAB — CULTURE, BLOOD (ROUTINE X 2)
CULTURE: NO GROWTH
Culture: NO GROWTH

## 2015-05-29 DEATH — deceased

## 2015-06-24 ENCOUNTER — Other Ambulatory Visit: Payer: Self-pay

## 2015-07-03 IMAGING — CR DG FEMUR 2+V*R*
4 series · 4 of 4 positions shown · non-contrast
Comparison: 01/18/2015 and 01/17/2015

CLINICAL DATA: Fracture follow-up.

EXAM:
RIGHT FEMUR 2 VIEWS

[ap (1 of 2)]
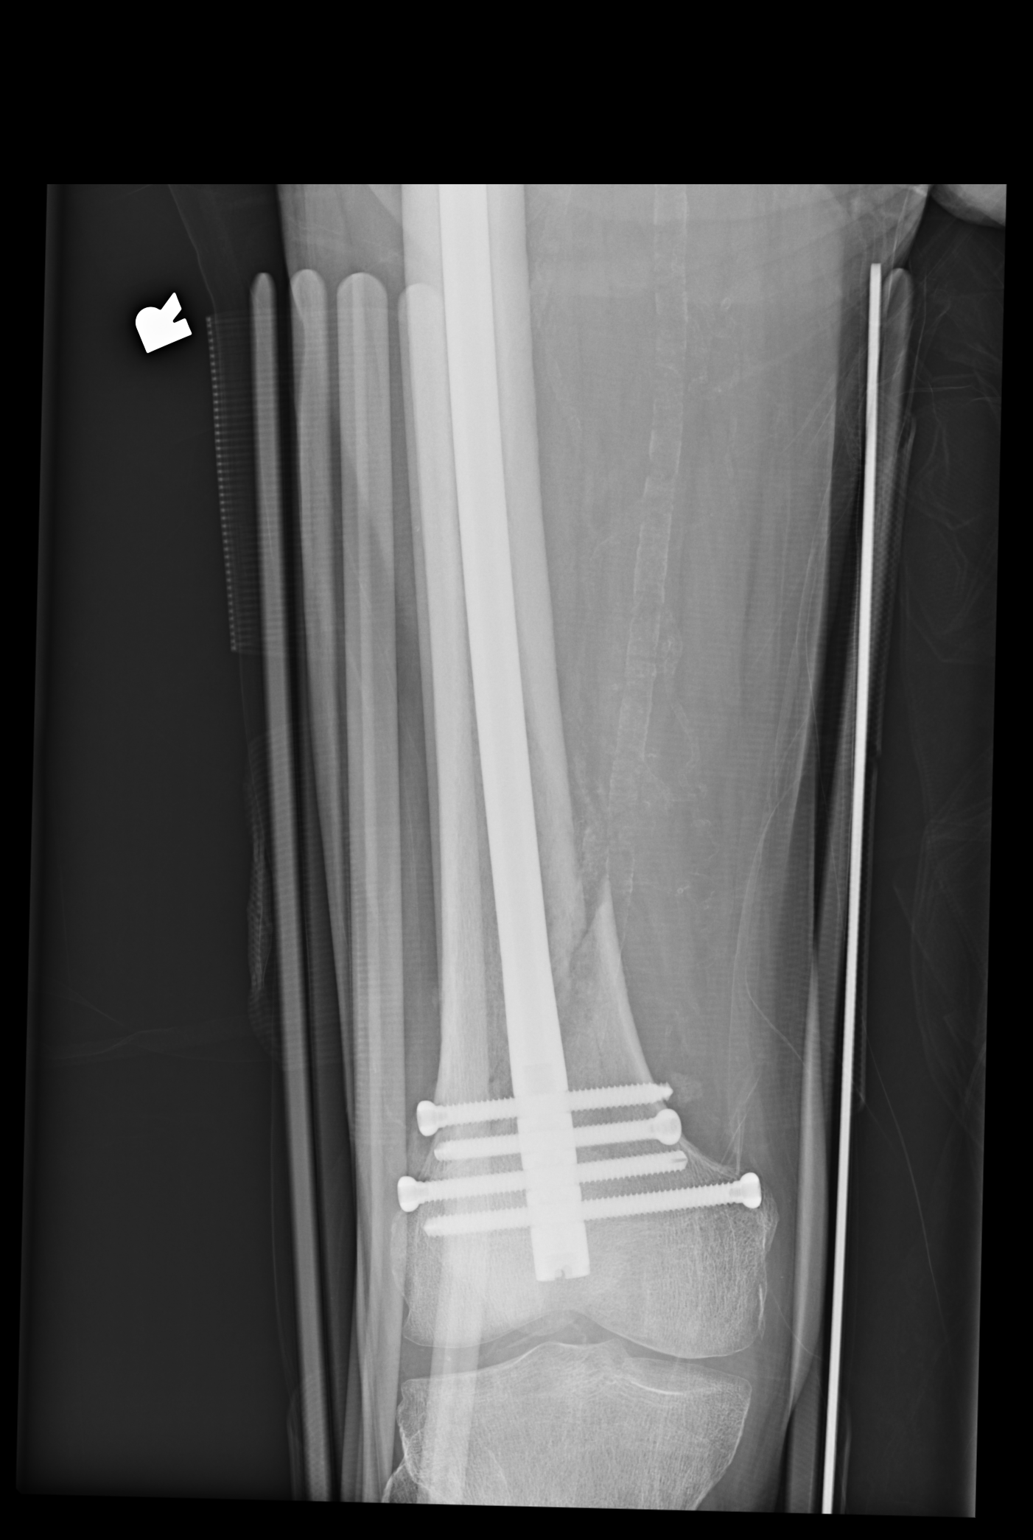

[lat (1 of 2)]
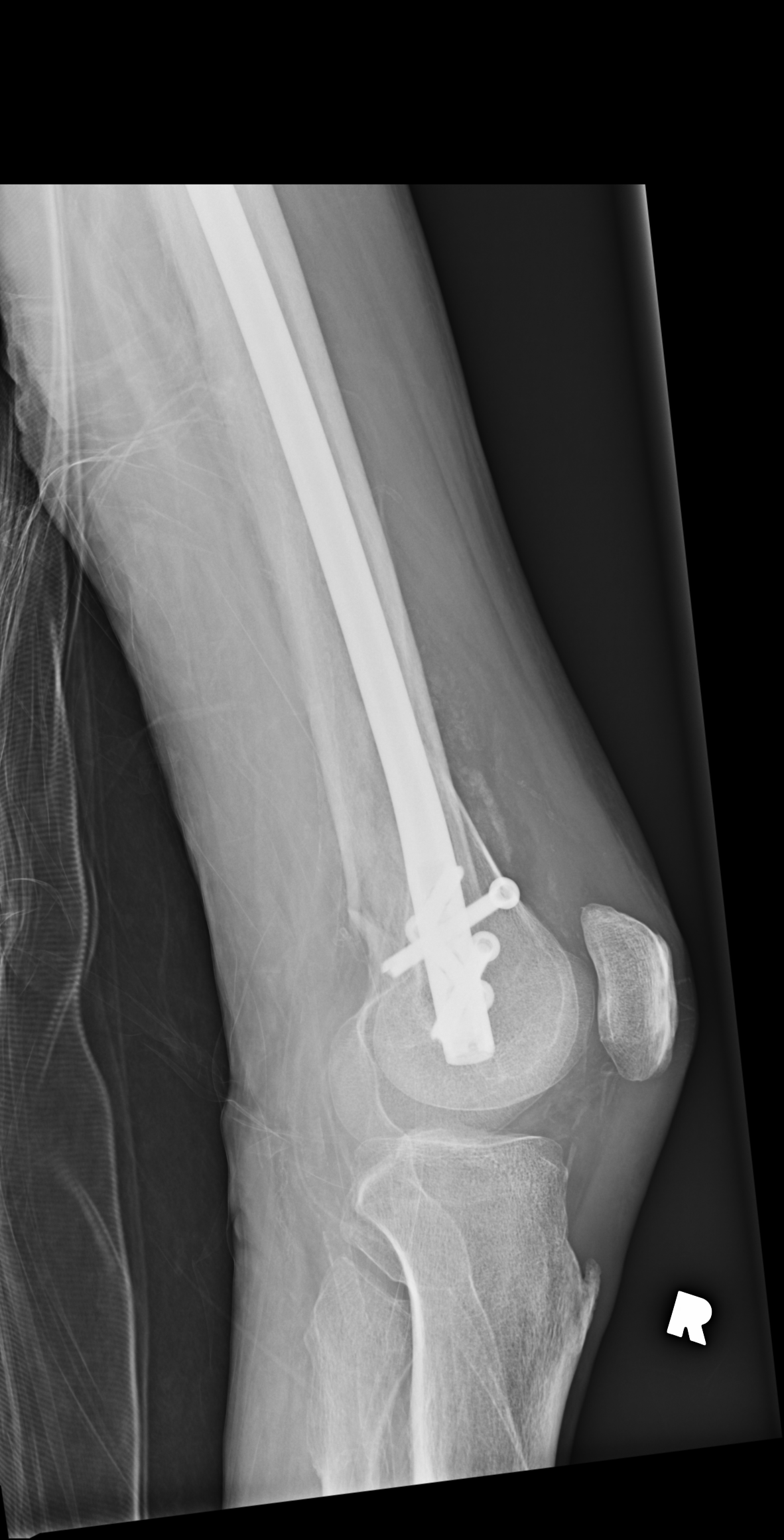

[ap (2 of 2)]
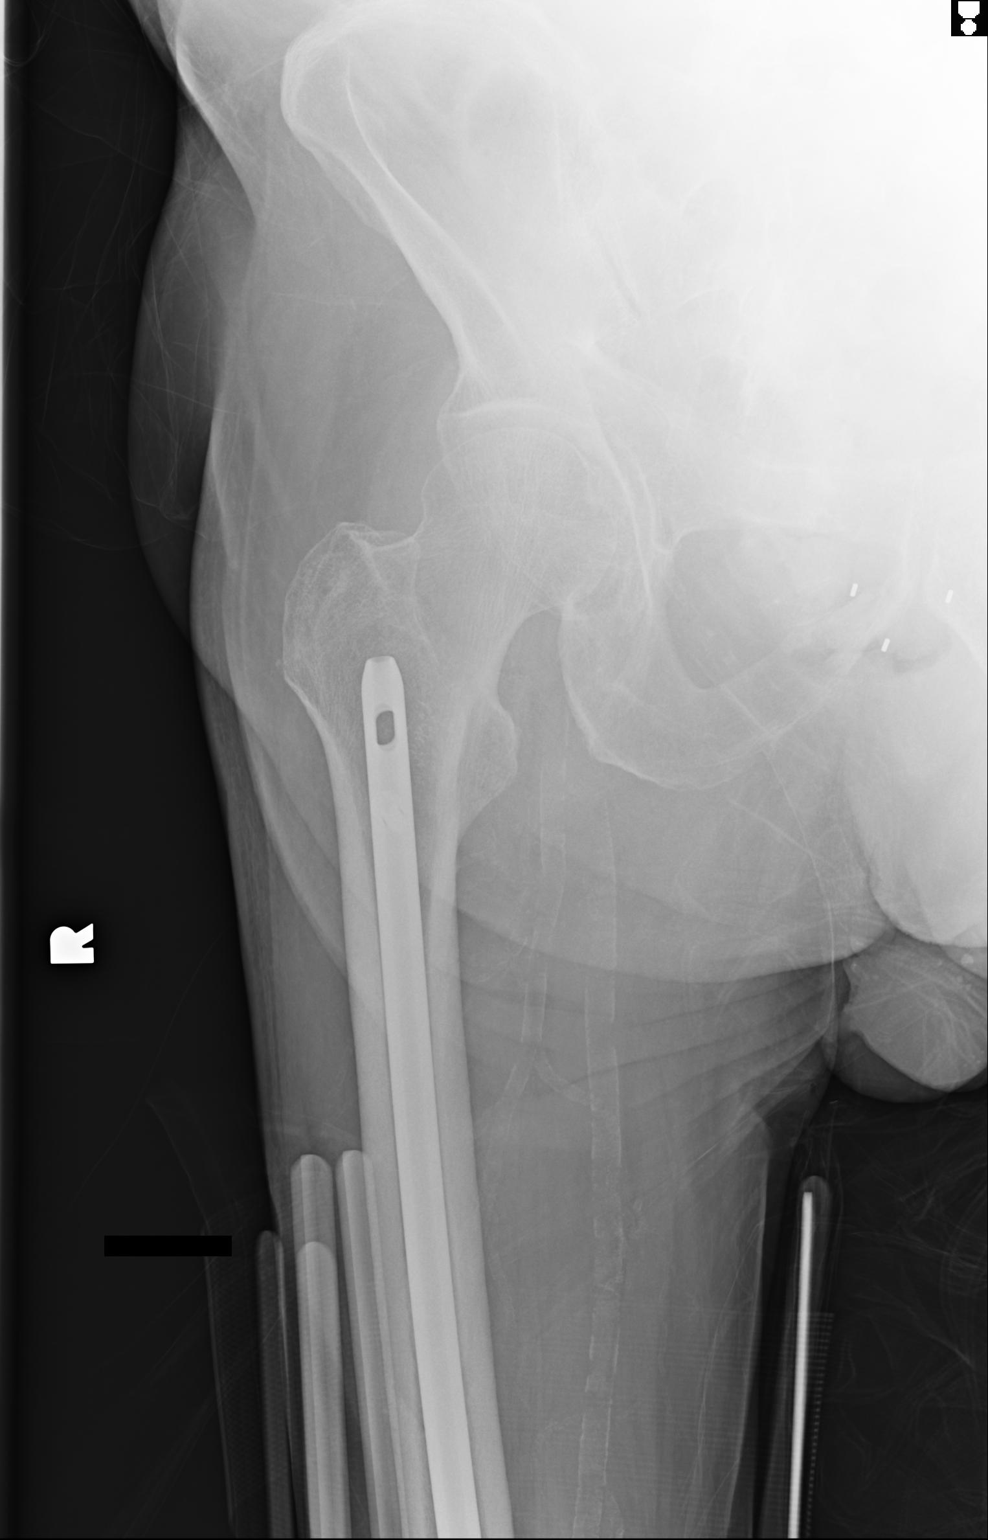

[lat (2 of 2)]
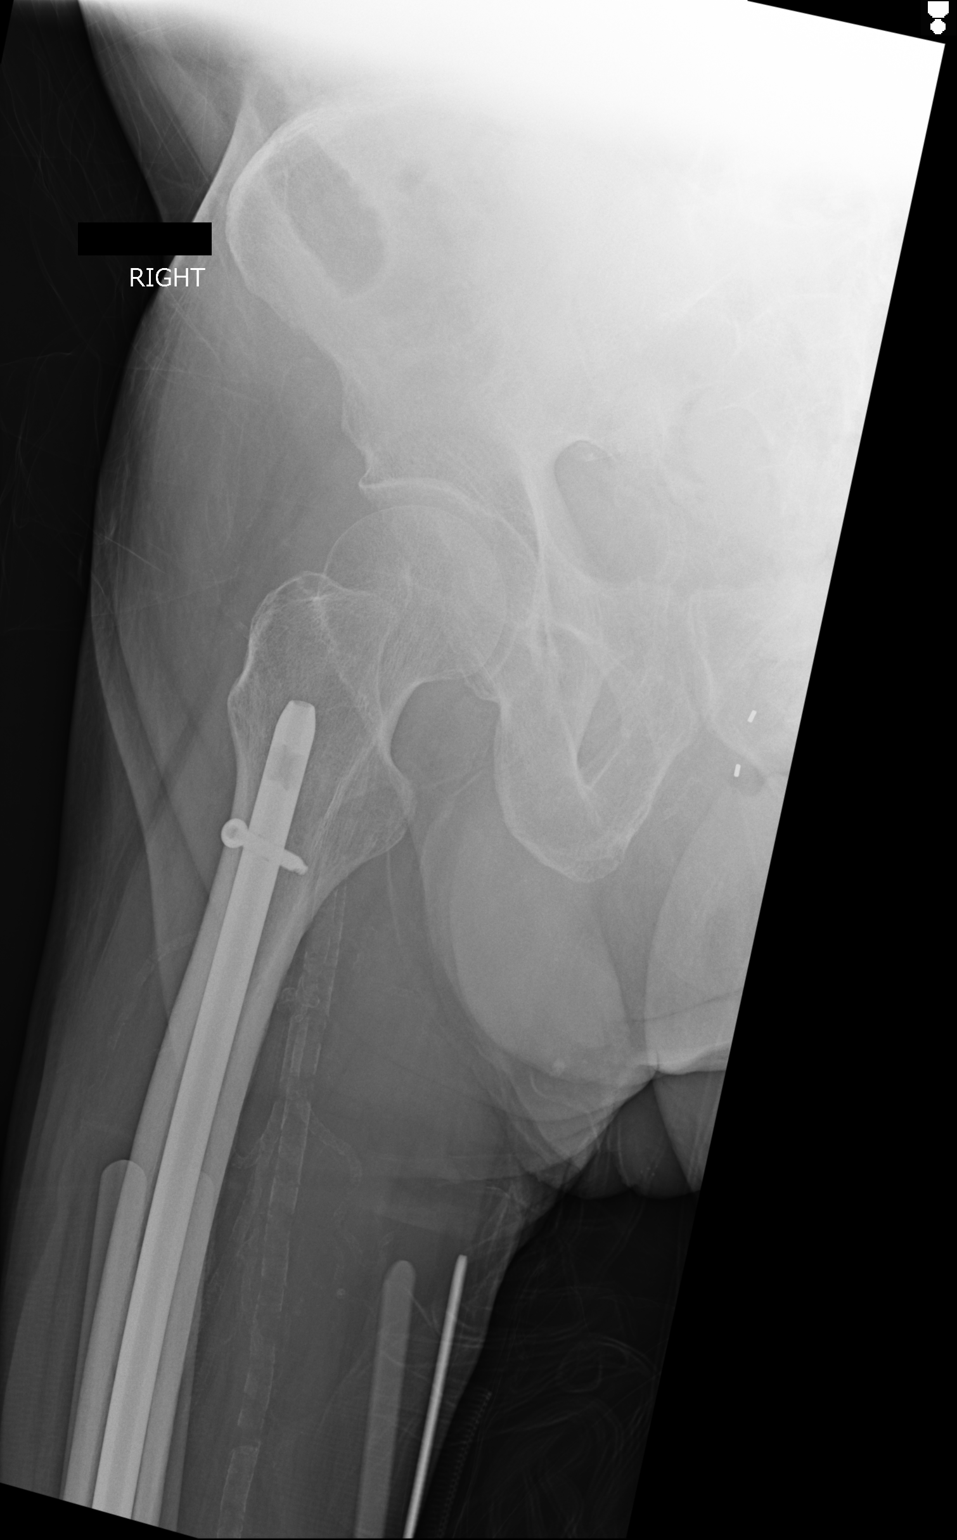

[4 of 4 positions shown; findings below may reference images not displayed]

FINDINGS: Mild degenerate change of the right hip. Right femoral
intramedullary nail bridges patient's distal femoral fracture as
hardware is intact with near anatomic alignment about the fracture
site. Mild bridging callus formation is evident. Remainder of the
exam is unchanged.
IMPRESSION: Distal right femoral fracture post internal fixation with hardware
intact and evidence of mild interval healing.

## 2015-07-11 IMAGING — CR DG KNEE 1-2V PORT*R*
3 series · 4 of 4 positions shown · non-contrast
Comparison: Right knee 01/17/2015.  Fluoroscopy 01/18/2015

CLINICAL DATA: Right knee pain and swelling for 2 days. No new
injury. Injury in [REDACTED]. IM nail to the right femur down on
01/18/2015. History of diabetes and prostate cancer.

EXAM:
PORTABLE RIGHT KNEE - 1-2 VIEW

[ap]
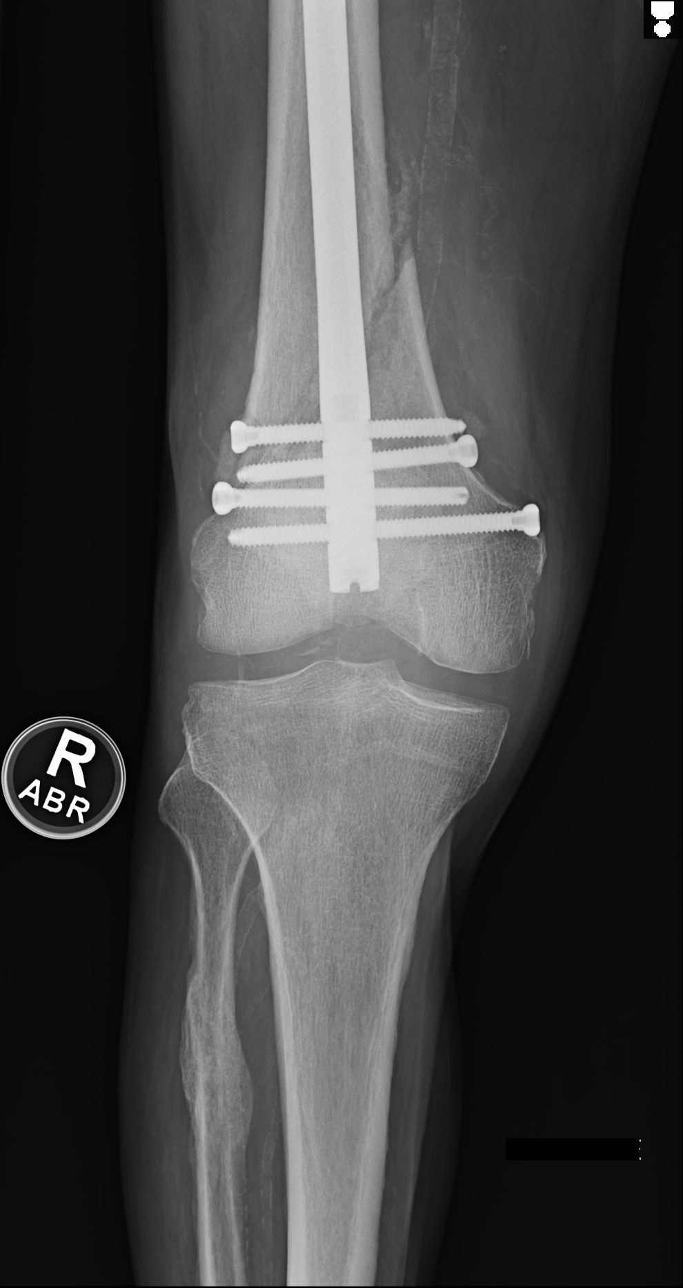

[lat]
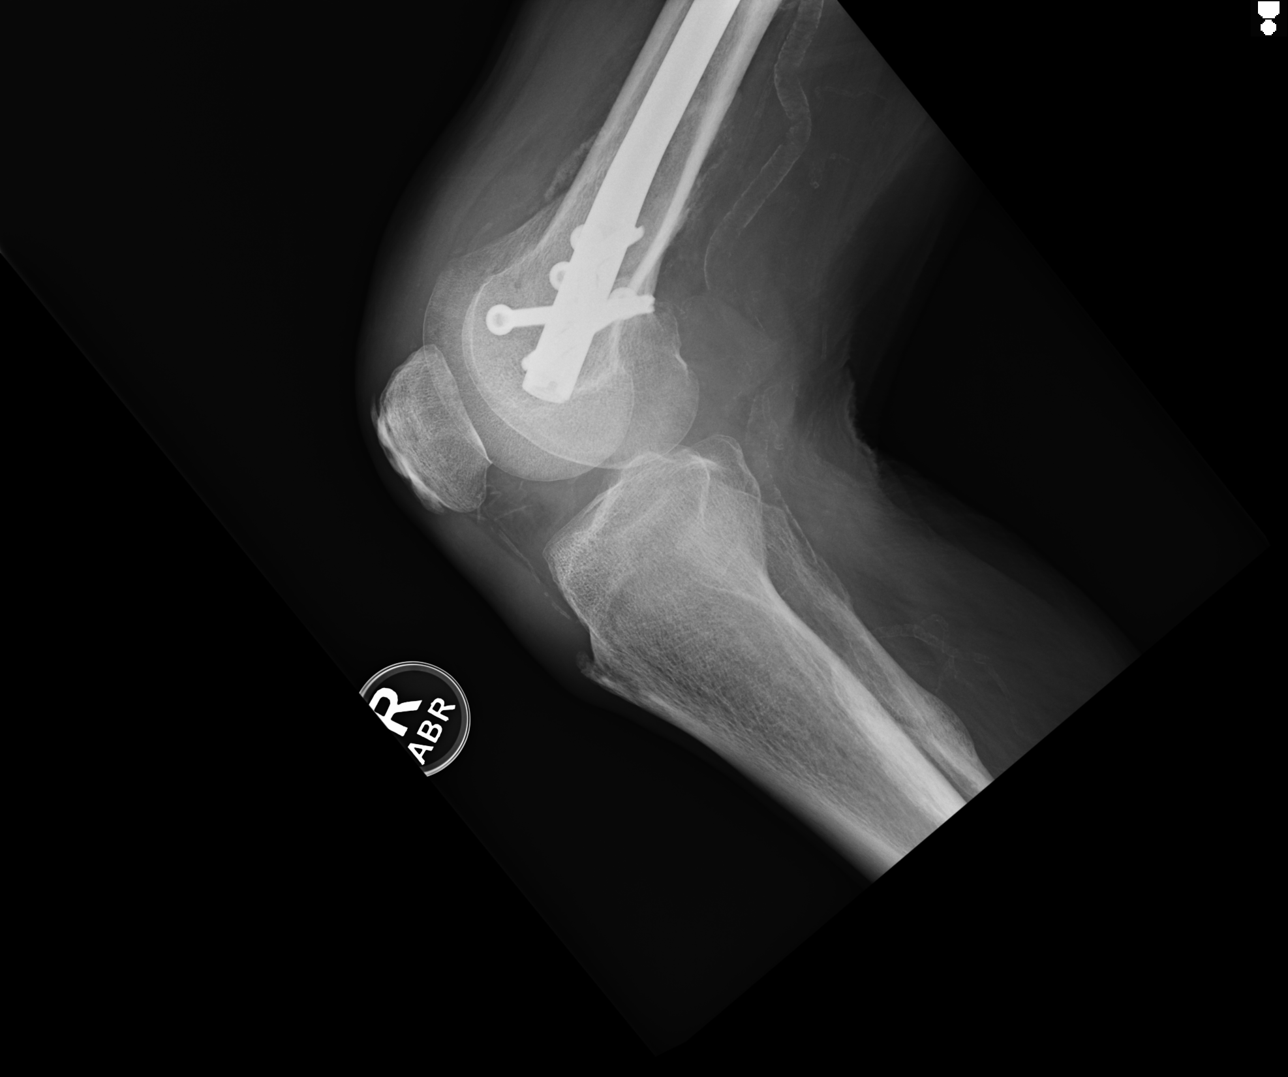

[Series 3: oblique · 0.17mm/px · 2 of 2 slices shown]
[im 1/2]
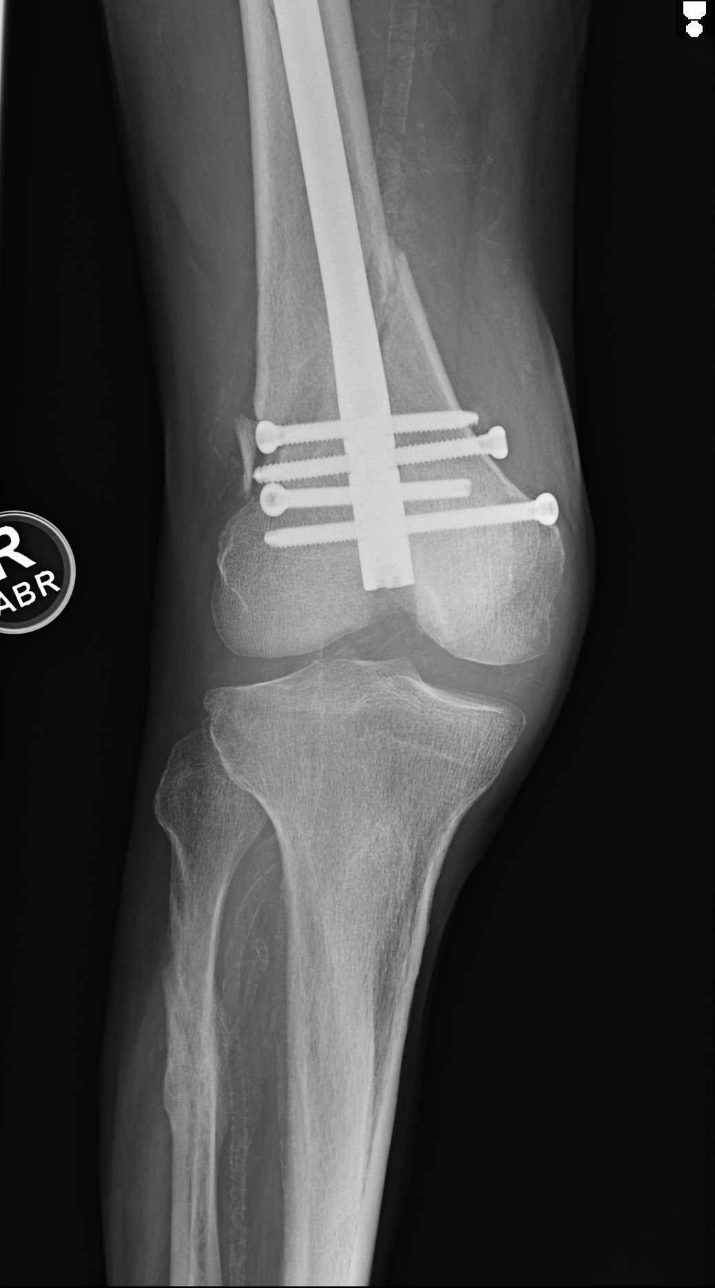
[im 2/2]
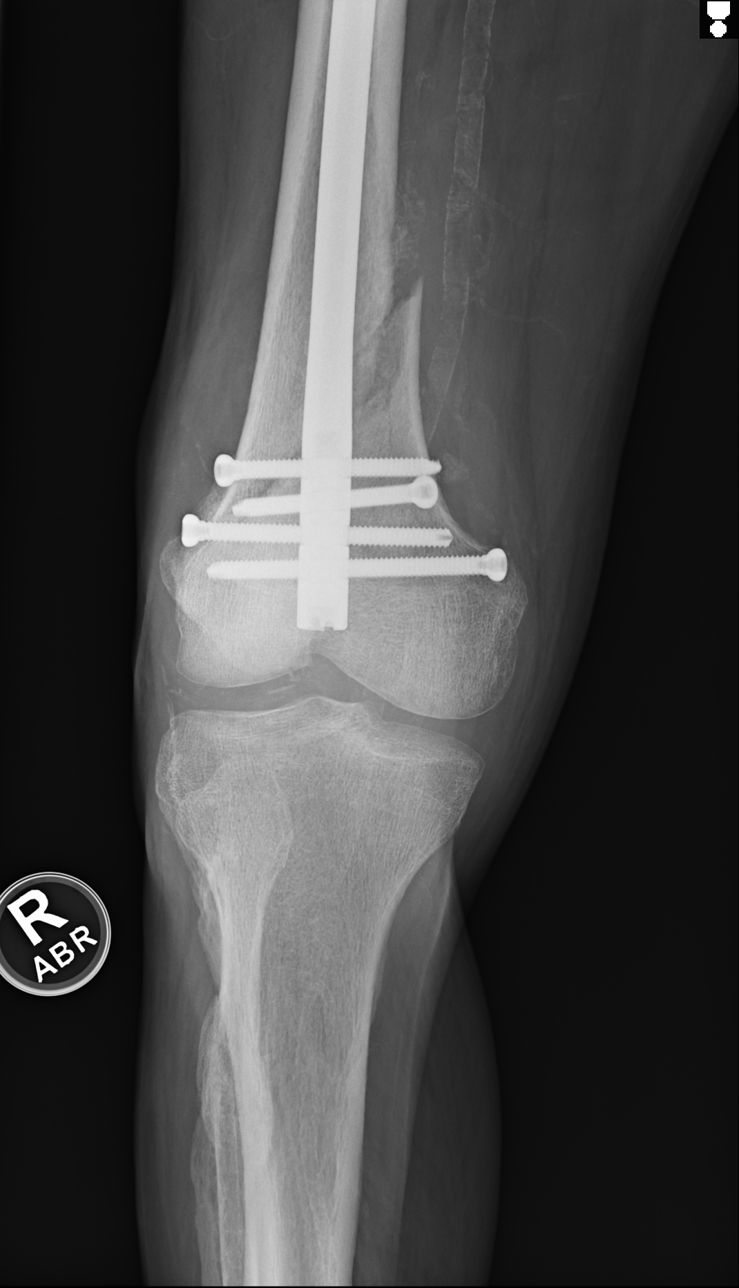

[4 of 4 positions shown; findings below may reference images not displayed]

FINDINGS: Interval placement of edge medullary rod with 4 distal locking
screws in the distal femur. Healing oblique fracture of the distal
femoral metaphysis. Fracture line remains present but callus
formation is present consistent with healing. Near-anatomic
alignment is demonstrated. There is a tiny displaced butterfly
fragment over the lateral screw heads. This appears to been present
on the prior study and is likely unchanged. Probable ligamentous
calcification over the anterior knee. Vascular calcifications. Old
healed fracture deformity of the proximal fibula.
IMPRESSION: Postoperative changes with internal fixation of a healing fracture
of the distal femoral metaphysis. Old fracture of the proximal
fibula. No definite acute bony abnormality.
